# Patient Record
Sex: Male | Born: 1955 | Race: White | Hispanic: No | Marital: Married | Smoking: Former smoker
Health system: Southern US, Community
[De-identification: ages and names within clinical notes are randomized; demographics above are authoritative.]

## PROBLEM LIST (undated history)

## (undated) DIAGNOSIS — Z95810 Presence of automatic (implantable) cardiac defibrillator: Secondary | ICD-10-CM

## (undated) DIAGNOSIS — I2510 Atherosclerotic heart disease of native coronary artery without angina pectoris: Secondary | ICD-10-CM

## (undated) DIAGNOSIS — G4730 Sleep apnea, unspecified: Secondary | ICD-10-CM

## (undated) DIAGNOSIS — I509 Heart failure, unspecified: Secondary | ICD-10-CM

## (undated) DIAGNOSIS — D649 Anemia, unspecified: Secondary | ICD-10-CM

## (undated) DIAGNOSIS — M1990 Unspecified osteoarthritis, unspecified site: Secondary | ICD-10-CM

## (undated) DIAGNOSIS — J189 Pneumonia, unspecified organism: Secondary | ICD-10-CM

## (undated) DIAGNOSIS — I480 Paroxysmal atrial fibrillation: Secondary | ICD-10-CM

## (undated) DIAGNOSIS — I472 Ventricular tachycardia: Secondary | ICD-10-CM

## (undated) DIAGNOSIS — I4510 Unspecified right bundle-branch block: Secondary | ICD-10-CM

## (undated) DIAGNOSIS — I255 Ischemic cardiomyopathy: Secondary | ICD-10-CM

## (undated) DIAGNOSIS — I219 Acute myocardial infarction, unspecified: Secondary | ICD-10-CM

## (undated) DIAGNOSIS — E119 Type 2 diabetes mellitus without complications: Secondary | ICD-10-CM

## (undated) DIAGNOSIS — E785 Hyperlipidemia, unspecified: Secondary | ICD-10-CM

## (undated) HISTORY — DX: Sleep apnea, unspecified: G47.30

## (undated) HISTORY — DX: Hyperlipidemia, unspecified: E78.5

## (undated) HISTORY — PX: SHOULDER OPEN ROTATOR CUFF REPAIR: SHX2407

## (undated) HISTORY — DX: Anemia, unspecified: D64.9

## (undated) HISTORY — PX: FRACTURE SURGERY: SHX138

## (undated) HISTORY — PX: LIVER BIOPSY: SHX301

## (undated) HISTORY — DX: Paroxysmal atrial fibrillation: I48.0

## (undated) HISTORY — DX: Hemochromatosis, unspecified: E83.119

## (undated) HISTORY — DX: Unspecified right bundle-branch block: I45.10

## (undated) HISTORY — DX: Ischemic cardiomyopathy: I25.5

## (undated) HISTORY — PX: INGUINAL HERNIA REPAIR: SUR1180

---

## 1998-04-10 DIAGNOSIS — J189 Pneumonia, unspecified organism: Secondary | ICD-10-CM

## 1998-04-10 HISTORY — DX: Pneumonia, unspecified organism: J18.9

## 2000-06-25 ENCOUNTER — Ambulatory Visit (HOSPITAL_BASED_OUTPATIENT_CLINIC_OR_DEPARTMENT_OTHER): Admission: RE | Admit: 2000-06-25 | Discharge: 2000-06-26 | Payer: Self-pay | Admitting: Orthopedic Surgery

## 2001-11-10 ENCOUNTER — Encounter: Payer: Self-pay | Admitting: Emergency Medicine

## 2001-11-10 ENCOUNTER — Emergency Department (HOSPITAL_COMMUNITY): Admission: EM | Admit: 2001-11-10 | Discharge: 2001-11-10 | Payer: Self-pay

## 2004-04-10 DIAGNOSIS — I219 Acute myocardial infarction, unspecified: Secondary | ICD-10-CM

## 2004-04-10 HISTORY — DX: Acute myocardial infarction, unspecified: I21.9

## 2004-04-10 HISTORY — PX: CORONARY ARTERY BYPASS GRAFT: SHX141

## 2004-06-10 ENCOUNTER — Ambulatory Visit: Payer: Self-pay | Admitting: Hematology & Oncology

## 2004-07-27 ENCOUNTER — Ambulatory Visit: Payer: Self-pay | Admitting: Hematology & Oncology

## 2004-09-28 ENCOUNTER — Ambulatory Visit: Payer: Self-pay | Admitting: Hematology & Oncology

## 2004-11-22 ENCOUNTER — Ambulatory Visit: Payer: Self-pay | Admitting: Hematology & Oncology

## 2005-02-22 ENCOUNTER — Ambulatory Visit: Payer: Self-pay | Admitting: Hematology & Oncology

## 2005-03-12 ENCOUNTER — Inpatient Hospital Stay (HOSPITAL_COMMUNITY): Admission: EM | Admit: 2005-03-12 | Discharge: 2005-03-17 | Payer: Self-pay | Admitting: Emergency Medicine

## 2005-04-27 ENCOUNTER — Encounter (HOSPITAL_COMMUNITY): Admission: RE | Admit: 2005-04-27 | Discharge: 2005-07-26 | Payer: Self-pay | Admitting: Cardiology

## 2005-05-30 ENCOUNTER — Ambulatory Visit: Payer: Self-pay | Admitting: Hematology & Oncology

## 2005-06-08 HISTORY — PX: WRIST FRACTURE SURGERY: SHX121

## 2005-06-23 ENCOUNTER — Emergency Department (HOSPITAL_COMMUNITY): Admission: EM | Admit: 2005-06-23 | Discharge: 2005-06-23 | Payer: Self-pay | Admitting: Emergency Medicine

## 2005-06-27 ENCOUNTER — Ambulatory Visit (HOSPITAL_BASED_OUTPATIENT_CLINIC_OR_DEPARTMENT_OTHER): Admission: RE | Admit: 2005-06-27 | Discharge: 2005-06-27 | Payer: Self-pay | Admitting: Orthopedic Surgery

## 2005-11-23 ENCOUNTER — Ambulatory Visit: Payer: Self-pay | Admitting: Hematology & Oncology

## 2008-06-11 ENCOUNTER — Ambulatory Visit (HOSPITAL_COMMUNITY): Admission: RE | Admit: 2008-06-11 | Discharge: 2008-06-12 | Payer: Self-pay | Admitting: Orthopedic Surgery

## 2009-12-21 ENCOUNTER — Ambulatory Visit: Payer: Self-pay | Admitting: Cardiology

## 2009-12-22 ENCOUNTER — Ambulatory Visit: Payer: Self-pay | Admitting: Cardiology

## 2010-07-12 ENCOUNTER — Other Ambulatory Visit: Payer: Self-pay | Admitting: *Deleted

## 2010-07-12 DIAGNOSIS — E785 Hyperlipidemia, unspecified: Secondary | ICD-10-CM

## 2010-07-12 MED ORDER — ATORVASTATIN CALCIUM 40 MG PO TABS: 40.0000 mg | ORAL_TABLET | Freq: Every day | ORAL | Status: DC

## 2010-07-12 NOTE — Telephone Encounter (Signed)
Pt requesting to be changed to gen med for Vytorin. Per Dr. Swaziland will change to Lipitor 40 mg. Will repeat lab in 3 mo

## 2010-07-21 LAB — BASIC METABOLIC PANEL
BUN: 11 mg/dL (ref 6–23)
CO2: 23 mEq/L (ref 19–32)
Calcium: 9.3 mg/dL (ref 8.4–10.5)
Chloride: 105 mEq/L (ref 96–112)
Creatinine, Ser: 0.9 mg/dL (ref 0.4–1.5)
GFR calc Af Amer: >60 mL/min (ref >60)
GFR calc non Af Amer: >60 mL/min (ref >60)
Glucose, Bld: 98 mg/dL (ref 70–99)
Potassium: 4.5 mEq/L (ref 3.5–5.1)
Sodium: 137 mEq/L (ref 135–145)

## 2010-07-21 LAB — URINALYSIS, ROUTINE W REFLEX MICROSCOPIC
Bilirubin Urine: NEGATIVE
Glucose, UA: NEGATIVE mg/dL
Hgb urine dipstick: NEGATIVE
Ketones, ur: NEGATIVE mg/dL
Nitrite: NEGATIVE
Protein, ur: NEGATIVE mg/dL
Specific Gravity, Urine: 1.022 (ref 1.005–1.030)
Urobilinogen, UA: 1 mg/dL (ref 0.0–1.0)
pH: 7 (ref 5.0–8.0)

## 2010-07-21 LAB — GLUCOSE, CAPILLARY
Glucose-Capillary: 100 mg/dL — ABNORMAL HIGH (ref 70–99)
Glucose-Capillary: 117 mg/dL — ABNORMAL HIGH (ref 70–99)
Glucose-Capillary: 141 mg/dL — ABNORMAL HIGH (ref 70–99)
Glucose-Capillary: 145 mg/dL — ABNORMAL HIGH (ref 70–99)

## 2010-07-21 LAB — DIFFERENTIAL
Basophils Absolute: 0 10*3/uL (ref 0.0–0.1)
Basophils Relative: 0 % (ref 0–1)
Eosinophils Absolute: 0.1 10*3/uL (ref 0.0–0.7)
Eosinophils Relative: 2 % (ref 0–5)
Lymphocytes Relative: 26 % (ref 12–46)
Lymphs Abs: 1.6 10*3/uL (ref 0.7–4.0)
Monocytes Absolute: 0.5 10*3/uL (ref 0.1–1.0)
Monocytes Relative: 9 % (ref 3–12)
Neutro Abs: 3.9 10*3/uL (ref 1.7–7.7)
Neutrophils Relative %: 63 % (ref 43–77)

## 2010-07-21 LAB — CBC
HCT: 41.1 % (ref 39.0–52.0)
Hemoglobin: 14.8 g/dL (ref 13.0–17.0)
MCHC: 35.9 g/dL (ref 30.0–36.0)
MCV: 95.9 fL (ref 78.0–100.0)
Platelets: 239 10*3/uL (ref 150–400)
RBC: 4.28 MIL/uL (ref 4.22–5.81)
RDW: 12.5 % (ref 11.5–15.5)
WBC: 6.1 10*3/uL (ref 4.0–10.5)

## 2010-07-21 LAB — APTT: aPTT: 25 seconds (ref 24–37)

## 2010-07-21 LAB — PROTIME-INR
INR: 0.9 (ref 0.00–1.49)
Prothrombin Time: 12 seconds (ref 11.6–15.2)

## 2010-08-23 NOTE — Op Note (Signed)
NAMEMATTHEWS, [name]              ACCOUNT NO.:  [id number]   MEDICAL RECORD NO.:  [id number]          PATIENT TYPE:  AMB   LOCATION:  SDS                          FACILITY:  [facility]   PHYSICIAN:  Almedia Balls. Ranell Patrick, M.D. DATE OF BIRTH:  02-09-1956   DATE OF PROCEDURE:  06/11/2008  DATE OF DISCHARGE:                               OPERATIVE REPORT   PREOPERATIVE DIAGNOSIS:  Right shoulder rotator cuff tear.   POSTOPERATIVE DIAGNOSIS:  Right shoulder rotator cuff tear as well as  superior labral tear, anterior-posterior with unstable biceps anchor.   PROCEDURES PERFORMED:  1. Right shoulder arthroscopy with extensive intraarticular      debridement including debridement of superior labrum anterior and      posterior lesion as well as arthroscopic biceps tenotomy.  2. Arthroscopic labral debridement.  3. Arthroscopic subacromial decompression with CA ligament release      followed by mini-open rotator cuff repair and biceps tenodesis in a      groove.   ATTENDING SURGEON:  Almedia Balls. Ranell Patrick, MD   ASSISTANT:  Donnie Coffin. Dixon, PA-C.   ANESTHESIA:  General anesthesia was used plus interscalene block.   ESTIMATED BLOOD LOSS:  Minimal.   FLUID REPLACEMENT:  1200 mL crystalloid.   INSTRUMENT COUNT:  Correct.   COMPLICATIONS:  None.   Perioperative antibiotics were given.   INDICATIONS:  The patient is a 55 year old male with a history of  worsening right shoulder pain attributable to a torn rotator cuff.  The  patient has failed conservative management and has an MRI documenting a  torn cuff, presents now for operative repair.  The patient desires  surgical treatment to relieve his pain and improve his function.  Informed consent was obtained.   DESCRIPTION OF PROCEDURE:  After an adequate level of anesthesia was  achieved, the patient was positioned in the modified beach chair  position.  The right shoulder examined under anesthesia.  The patient  had full passive motion of  shoulder with no induced stiffness, no  instability.  Following an EUA, we went ahead and sterilely prepped and  draped the right shoulder and the arm in the usual manner.  We entered  the shoulder using standard arthroscopic portals including anterior,  posterior, and lateral portals.  We identified significant synovitis and  inflammation throughout the joint.  The patient had a torn superior  ligament with an unstable biceps anchor, performed a labral debridement  and biceps tenotomy using basket forceps and motorized shaver.  We  debrided down the stable labral tissue.  Anterior-inferior and posterior-  inferior labrum was intact.  The patient's rotator cuff was torn and  this appeared to be an inverted V-shaped tear to about the mid joint.  The subscapularis was noted to be normal.  Articular cartilage on the  glenoid was normal and on the humeral head was normal.  Posterior cuff  including teres minor and most of the infraspinatus was normal.  We then  placed the scope in the subacromial space, performed a thorough  bursectomy and acromioplasty creating a type 1 acromial shape, not  violating the inferior  AC ligaments.  We did release the CA ligament.  There was a thorough decompression of the rotator cuff outlet and the  tear was full thickness as visualized from the bursal surface.  So at  this point, we concluded the arthroscopic portion of surgery and made a  small mini-open incision starting at the raphe between the anterior and  lateral heads of the deltoid and extending down about 4 cm, dissection  down through subcutaneous tissues, split the deltoid along with its  fibers using Bovie.  We then placed an Arthrex retractor.  We externally  rotated to visualize the biceps groove.  We incised the soft tissue  overlying the biceps tendon, delivered the tendon into the wound,  whipstitched that with 2-0 FiberWire suture to reinforce the tenodesis  area.  We then placed a single  Penalok anchor in the floor of the  bicipital groove with mid tension with the elbow at 90 degrees.  We  tenodesed the tendon, bringing the Penalok suture up through the  reinforced area of the biceps tenodesis site.  Once we tied the tendon  down the groove, we clipped the remaining tendon off, trimmed the suture  and were happy with the low-profile repair.  At this point, we exposed  the rotator cuff tear portion.  This was again an inverted V tear with  about a 1-cm rotator cuff footprint exposed.  We used a rongeur to get  down to the bleeding bone.  We then placed 2 side-to-side #2 FiberWire  sutures after we freed up the tendon on both bursal and joint sides to  make sure it was good balance and was maximally released.  We put 2 side-  to-side #2 FiberWires per margin conversions technique.  We then placed  a single 5.5 Bio-Corkscrew anchor adjacent to the articular cartilage  bringing those sutures up to the anterior and posterior limbs of the  tear and bringing that tear further together and then we oversewed out  lateral to that with #2 FiberWire to a figure-of-eight.  We had a nice  low-profile repair.  We did leave one of those sutures tied from the  side-to-side repair and then used that with a PushLock anchor out  laterally to hold down the suture material and also to further hold that  tendon protecting the anchor sutures.  Once this was done, we took the  shoulder through a full range of motion and no impingement was noted.  We thoroughly irrigated and closed with interrupted 0-Vicryl suture on  the deltoid, 2-0 Vicryl subcutaneous closure and 4-0 Monocryl for the  skin.  Steri-Strips were applied followed by a sterile dressing.  The  patient tolerated the surgery well.      Almedia Balls. Ranell Patrick, M.D.  Electronically Signed     SRN/MEDQ  D:  06/11/2008  T:  06/12/2008  Job:  [id number]

## 2010-08-26 NOTE — H&P (Signed)
[name], [name]              ACCOUNT NO.:  [id number]   MEDICAL RECORD NO.:  [id number]          PATIENT TYPE:  INP   LOCATION:  2309                         FACILITY:  [facility]   PHYSICIAN:  Peter M. Swaziland, M.D.  DATE OF BIRTH:  01/02/1956   DATE OF ADMISSION:  03/11/2005  DATE OF DISCHARGE:                                HISTORY & PHYSICAL   HISTORY OF PRESENT ILLNESS:  [name] is a 55 year old white male smoker  who presents with acute onset of substernal chest pain at approximately 10  p.m. tonight.  The patient was described as sever, crushing, mid central  chest pain radiating to his right arm, associated with shortness of breath  with some radiation to his back.  It was unrelieved.  It was noted on  transport by EMS that his blood pressure dropped to 70 systolic.  The  patient admits to having some intermittent substernal chest discomfort and  shortness of breath for the past week.  He has no known cardiac history.  His major cardiac risks factor is smoking.  He denies history of diabetes,  hypertension, or hypercholesterolemia.   PAST MEDICAL HISTORY:  1.  Significant for sleep apnea, currently untreated.  2.  History of prior left rotator cuff repair and needs repair on his right      rotator cuff.  3.  History of hemochromatosis and is followed by Dr. Myna Hidalgo and, in fact,      had phlebotomy yesterday.   MEDICATIONS:  Include aspirin 81 mg per day.   ALLERGIES:  No known allergies.   SOCIAL HISTORY:  He is married and has one daughter.  He smoked 1-1/2 packs  per day and has been smoking for over 35 years.  He also has a history of  significant alcohol abuse with drinking up to 18 beers per day.   FAMILY HISTORY:  Unknown as he is adopted.   REVIEW OF SYSTEMS:  He does note some sweating.  He has had no nausea or  vomiting.  His mouth is dry.  He has had no history of [name], stroke, or  claudication.  He has had no bleeding disorder.  Other Review of Systems  are  negative.   PHYSICAL EXAMINATION:  GENERAL:  The patient is a middle-aged white male in  moderate distress.  VITAL SIGNS:  Blood pressure 100/60, pulse 105 in sinus rhythm.  He is  afebrile.  HEENT:  Normocephalic and atraumatic.  Pupils equal, round, and reactive.  Conjunctivae are injected.  Oropharynx is clear.  NECK:  Supple without JVD, adenopathy, thyromegaly, or bruits.  LUNGS:  Clear to auscultation and percussion.  CARDIAC:  Regular rate and rhythm without murmur, rub, or gallop.  ABDOMEN:  Soft and nontender.  EXTREMITIES:  Pulses are 2+ and symmetric.  He has no edema.  NEUROLOGIC:  Exam is nonfocal.   LABORATORY DATA:  ECG shows normal sinus rhythm and right bundle branch  block.  There is ST elevation in leads V1 through V3 as well as in lead III.  There is a Q wave in lead III as well.  IMPRESSION:  1.  Acute myocardial infarction, question septal by ECG and criteria.  2.  Tobacco abuse.  3.  Hemochromatosis.  4.  Alcohol abuse.  5.  Sleep apnea.   PLAN:  1.  The patient will be taken for emergent cardiac catheterization with      possible intervention.  2.  We will need to avoid nitrates as the patient has taken Viagra tonight.  3.  He is given aspirin and IV heparin in the emergency room as well as IV      analgesics.           ______________________________  Peter M. Swaziland, M.D.     PMJ/MEDQ  D:  03/12/2005  T:  03/12/2005  Job:  [id number]   cc:   Sheliah Plane, MD  [address]  [place]  [place] [address]   Geoffry Paradise, M.D.  Fax: [phone]   Rose Phi. Myna Hidalgo, M.D.  Fax: [phone]

## 2010-08-26 NOTE — Cardiovascular Report (Signed)
[name], [name]              ACCOUNT NO.:  [id number]   MEDICAL RECORD NO.:  [id number]          PATIENT TYPE:  INP   LOCATION:  2309                         FACILITY:  [facility]   PHYSICIAN:  [name], M.D.  DATE OF BIRTH:  21-Apr-1955   DATE OF PROCEDURE:  03/12/2005  DATE OF DISCHARGE:                              CARDIAC CATHETERIZATION   INDICATIONS FOR PROCEDURE:  A 55 year old white male presents with acute  myocardial infarction and crushing chest pain. He has a history of tobacco  abuse and hemochromatosis.   Access was via the right femoral artery using standard Seldinger technique.  Also access the left femoral artery for placement of intra-aortic balloon  pump.   EQUIPMENT:  A 6-French 5 cm left Judkins catheter, 6-French 4 cm right  Judkins catheter, 6-French pigtail catheter and a 40 cm intra-aortic balloon  pump.   PROCEDURES:  Left heart catheterization, coronary and left ventricular  angiography, placement of intra-aortic balloon pump.   MEDICATIONS:  Morphine 2 milligrams IV. ACT was 192.   CONTRAST DYE:  105 cc of Omnipaque.   HEMODYNAMIC DATA:  Aortic pressure was 97/64 with a mean of 80 mmHg, left  ventricle pressure 97 with EDP of 36 mmHg.   ANGIOGRAPHIC DATA:  Left coronary arises and distributes normally. Left main  coronary artery is calcified without significant obstructive disease.   Left anterior descending artery is a large vessel. It has a 90% proximal  stenosis. There appears to be an acute cutoff of the LAD at the far distal  LAD as it wraps around the apex. The first diagonal branch is diffusely  diseased up to 40%.   There is a very small intermediate branch which has 80-90% stenosis  proximally.   The left circumflex coronary artery is occluded. There is left to left  collaterals to single moderate-sized marginal vessel. There is a fairly long  gap between the total occlusion and where the vessel fills by collateral.   The  right coronary artery is occluded at the ostium. There is a left to  right collaterals to the distal right coronary circulation what appears to  be posterolateral PDA branches. There is no significant collateralization of  the mid and proximal right coronary. There is a small AV fistula that arises  from the proximal right coronary artery that appears to connect to the right  atrium.   Left ventricular angiography performed in RAO view demonstrates inferior  wall akinesia. There is global hypokinesia with overall moderate to severe  left ventricular systolic dysfunction. Ejection fraction estimated at 35%.   FINAL INTERPRETATION:  1.  Severe three-vessel obstructive atherosclerotic coronary disease. Based      on his findings, I think that the acute lesion is actually his left      anterior descending artery. The right coronary and left circumflex      coronary occlusions appear to be old and do have faint to moderate      collaterals.  2.  Moderate to severe left ventricular dysfunction.  3.  Significantly elevated left ventricular filling pressures.   PLAN:  The  patient does not appear to be a suitable candidate for  percutaneous catheter-based intervention. The totally occluded circumflex  and right coronary artery are poorly suited for acute intervention. The LAD  is severe but would be extremely high risk given his LV dysfunction and  given the fact his other vessels were occluded. We recommended coronary  bypass surgery and consultation was obtained with Dr. Tyrone Weaver. Intra-aortic  balloon pump was placed via the left femoral artery and with balloon pump  placement his low blood pressure improved and his chest pain symptoms  resolved. The patient was transferred to the intensive care unit pending a  decision concerning bypass surgery.           ______________________________  [name], M.D.     PMJ/MEDQ  D:  03/12/2005  T:  03/12/2005  Job:  [id number]   cc:   Colton Plane, MD  [address]  [place]  [place] [address]   [name], M.D.  Fax: [phone]   [name], M.D.  Fax: [phone]

## 2010-08-26 NOTE — Discharge Summary (Signed)
[name], COUNSELL              ACCOUNT NO.:  [id number]   MEDICAL RECORD NO.:  [id number]          PATIENT TYPE:  INP   LOCATION:  2028                         FACILITY:  [facility]   PHYSICIAN:  Sheliah Plane, MD    DATE OF BIRTH:  08-18-1955   DATE OF ADMISSION:  03/11/2005  DATE OF DISCHARGE:  03/16/2005                                 DISCHARGE SUMMARY   PRIMARY ADMITTING DIAGNOSIS:  Chest pain.   DISCHARGE DIAGNOSES:  1.  Severe three-vessel coronary artery disease.  2.  Moderate to severe left ventricular dysfunction.  3.  History of sleep apnea.  4.  History of hemochromatosis followed by Dr. Myna Hidalgo.  5.  Hyperlipidemia.  6.  Postoperative hyperglycemia.  7.  History of tobacco abuse.  8.  Status post acute septal myocardial infarction.   PROCEDURES PERFORMED:  1.  Cardiac catheterization.  2.  Coronary artery bypass grafting (emergent) x3 (left internal mammary      artery to the LAD, saphenous vein graft to the obtuse marginal,      saphenous vein graft to the distal right coronary artery).  3.  Endoscopic vein harvest right thigh.   HISTORY:  The patient was 55 year old male who presented to the emergency  department on the date of this admission complaining of crushing substernal  chest pain radiating to his arm and associated with shortness of breath. He  was hypotensive in the ER and was found to have EKG changes of ST elevation  in leads V1-V3 as well as lead IV and Q-waves in lead III. He was seen by  Dr. Swaziland and was taken for emergent cardiac catheterization. He was found  to have severe three-vessel coronary artery disease which was not felt to be  amenable to percutaneous intervention. An intra-aortic balloon pump was  placed and the patient was stabilized and admitted to the floor.   HOSPITAL COURSE:  A cardiothoracic surgery consultation was obtained and the  patient was seen by Dr. Tyrone Sage. He reviewed the patient's films and agreed  his best  course of action would be to proceed with surgical  revascularization at this time. His cath did show a chronically occluded  right coronary artery, a chronically occluded circumflex coronary, and a 90%  proximal LAD lesion which was felt to be the culprit lesion. He explained  the risks, benefits and alternatives of surgery with the patient and the  patient agreed to proceed with CABG. He was taken to the operating room on  March 12, 2005 underwent CABG x3 as described in detail above. He  tolerated the procedure well and was transferred to the SICU in stable  condition. He was able to be extubated shortly after surgery. He was  hemodynamically stable and doing well on postoperative day #1. He was  maintained on low-dose dopamine drip initially and this was weaned over the  course of his first postoperative day and discontinued. His intra-aortic  balloon pump was also removed later that day on postoperative day #1. He  remained stable and by postoperative day three he was ready for transfer to  the floor.  Overall, he has made good progress postoperatively. He has been  hyperglycemic during this admission and has been treated with sliding scale  insulin and carbohydrate modified diet. His hemoglobin A1c on admission was  6.8. His blood pressures have also remained on the low side with systolic's  running just under 110. He has been started on a beta blocker and ACE  inhibitor but doses have not been titrated secondary to his low blood  pressure. It is felt that this can be further adjusted as an outpatient. He  has been volume overloaded and was started on Lasix and potassium  supplementation and is diuresing well. He has been seen by the tobacco  cessation counselor regarding quitting smoking. She has given the patient  educational materials and follow-up information for home. He has remained  afebrile and all vital signs have been stable. He was maintaining normal  sinus rhythm with  some mild tachycardia with heart rates in the low 100s.  His surgical incision sites were healing well. He was ambulating in the  halls without difficulty. He was maintaining O2 saturations of greater than  90% on room air.   His most recent labs showed hemoglobin of 11.9, hematocrit 33, white count  9.1, platelet count 205,000. Sodium 134, potassium 3.7 which has been  supplemented, BUN 17, creatinine 0.9. Also upon admission, the patient's  lipid profile showed a total cholesterol of 193, triglycerides 288, HDL 30,  LDL 121. It was felt that since he has remained stable and progressing  nicely that he will hopefully be ready for discharge home by March 17, 2005.   DISCHARGE MEDICATIONS:  1.  Enteric-coated aspirin 325 milligrams daily.  2.  Lopressor 25 milligrams b.i.d.  3.  Lisinopril 10 milligrams daily.  4.  Lipitor 20 milligrams daily.  5.  Lasix 40 milligrams daily x1 week.  6.  K-Dur 20 mEq daily x1 week.  7.  Folic acid 1 milligram daily.  8.  Tylox one to two q.4h. p.r.n. for pain.   DISCHARGE INSTRUCTIONS:  He is asked to refrain from driving, heavy lifting  or strenuous activity. He may continue ambulating daily and using his  incentive spirometer. He may shower daily and clean his incisions with soap  and water. He will continue low-fat, low-sodium diet.   DISCHARGE FOLLOWUP:  He will see Dr. Swaziland back in the office in two weeks  and should call to schedule an appointment. He will have a chest x-ray at  that visit. He will see Dr. Tyrone Sage on April 13, 2005 and should bring  his chest x-ray at this appointment for Dr. Tyrone Sage to review. He should  also follow up with his primary care physician in the next two to three  weeks for recheck of his blood sugars. He will call in the interim if he  experiences any problems or has questions.      Coral Ceo, P.A.      Sheliah Plane, MD Electronically Signed    GC/MEDQ  D:  03/16/2005  T:  03/16/2005   Job:  [id number]   cc:   Peter M. Swaziland, M.D.  Fax: [phone]   Geoffry Paradise, M.D.  Fax: [phone]   Rose Phi. Myna Hidalgo, M.D.  Fax: [phone]

## 2010-08-26 NOTE — Op Note (Signed)
[place]  Patient:    [name], [name]                     MRN: [id number] Proc. Date: 06/25/00 Adm. Date:  [id number] Attending:  Carolan Shiver Ii                           Operative Report  PREOPERATIVE DIAGNOSIS:  Rotator cuff tear, left shoulder, chronic with type 3 acromion.  POSTOPERATIVE DIAGNOSIS:  Rotator cuff tear, left shoulder, chronic with type 3 acromion.  OPERATION PERFORMED:  Open repair of chronic rotator cuff tear with acromioplasty and release of CA ligament.  SURGEON:  John L. Dorothyann Gibbs, M.D.  ASSISTANT:  Arnoldo Morale, P.A.  ANESTHESIA:  General.  INDICATIONS FOR PROCEDURE:  The patient has 1.5 cm avulsion of the supraspinatus at the greater tuberosity which appears relatively chronic and a very much downward sloping acromion with a very tight subacromial space and thickened bursa.  DESCRIPTION OF PROCEDURE:  Under general anesthesia, a sabercut incision was made in the left shoulder after routine prep and drape.  The deltoid was peeled from the lateral acromion with the electrocautery and from the anterior lateral of the acromion, fibers of the deltoid were separated and spread, showing the corner of the acromion and the underlying rotator cuff tear.  A Gelpe retractor was inserted.  A Cobb elevator was placed over the rotator cuff and a one inch curved osteotome was used to perform acromioplasty.  A rasp was then used to further smooth the undersurface of the acromion and remove any ridges extended over to the [facility] joint.  Once this was completed, it was irrigated.  The rotator cuff tear was examined.  Further synovitis of the bursa was removed.  The edge of the cuff was freshened with a 15 blade at the tear.  The greater tuberosity as a bone trough made using osteotome and mallet.A small drill was then used and #2 Tycron suture was placed in a modified tendon stitch pulling the tendon down to the bone trough  in three places giving an excellent repair with the knots tied below the level of the greater tuberosity.  The deltoid muscle was then reattached to the acromion through drill holes using #2 Tycron suture.  The subcutaneous was closed with 0 and 2-0 Vicryl and skin with clips.  Total operative time approximately 45 minutes.  The patient tolerated the procedure well and returned to recovery in good condition.  It should be noted that the shoulder was infiltrated with Marcaine with morphine with epinephrine at the close of the case for pain control assistance. DD:  06/25/00 TD:  06/26/00 Job: [id number]

## 2010-08-26 NOTE — Op Note (Signed)
[name], [name]              ACCOUNT NO.:  [id number]   MEDICAL RECORD NO.:  [id number]          PATIENT TYPE:  INP   LOCATION:  2028                         FACILITY:  [facility]   PHYSICIAN:  Sheliah Plane, MD    DATE OF BIRTH:  [birth year]   DATE OF PROCEDURE:  03/12/2005  DATE OF DISCHARGE:                                 OPERATIVE REPORT   PREOPERATIVE DIAGNOSIS:  Acute myocardial infarction with coronary occlusive  disease.   POSTOPERATIVE DIAGNOSIS:  Acute myocardial infarction with coronary  occlusive disease.   PROCEDURE:  Emergency coronary artery bypass grafting x3 with left internal  mammary to the left anterior descending coronary artery, reverse saphenous  vein graft to the obtuse marginal coronary artery and reverse saphenous vein  graft to the distal right coronary artery with right endovein harvesting.   SURGEON:  Sheliah Plane, M.D.   FIRST ASSISTANT:  Pecola Leisure, P.A.   BRIEF HISTORY:  The patient is a 55 year old male who presented to the  emergency room very late on March 11, 2005.  He was initially seen in the  emergency room and was found to be hypotensive.  He underwent cardiac  catheterization by Dr. Peter Swaziland and placement of intra-aortic balloon  pump.  The patient was seen in emergency consultation and was found to have  what appeared to be a chronically occluded right coronary artery,  chronically occluded circumflex coronary artery and a 90% proximal LAD  lesion.  With intra-aortic balloon pump, patient stabilized and became pain-  free.  Coronary artery bypass grafting was recommended, the patient agreed  and signed informed consent.   DESCRIPTION OF PROCEDURE:  With Swan-Ganz and arterial line monitors in  place, the patient underwent general endotracheal anesthesia without  incident.  The skin of the chest and legs was prepped with Betadine and  draped in the usual sterile manner.  Using a [employer] endovein harvesting  system, a  segment of vein was harvested from the right thigh and was of  adequate quality and caliber.  A median sternotomy was performed and the  left internal mammary artery was dissected out as a pedicle graft.  The  distal artery was divided and had good free flow.  The pericardium was  opened and overall ventricular function, especially of the anterior wall,  appeared normal.  The inferior wall was hypokinetic, this was with the intra-  aortic balloon pump in place and functioning.  The patient was systemically  heparinized, the ascending aorta and the right atrium cannulated and the  aortic root vented.  Cardioplegia needle was introduced into the ascending  aorta.  The patient was placed on cardiopulmonary bypass 2.4 liter per  minute per sq m.  Sites of anastomosis were selected.  The obtuse marginal  vessel was opened and it was diffusely diseased but did admit 1.5 mm probe.  Vessel was partially intramyocardial.  Using a running 7-0 Prolene, a distal  anastomosis was performed.  Attention was then turned to the posterior  descending.  This vessel was very small but the distal right was somewhat  larger and was opened  and admitted a 1.5-mm probe.  Using a running 7-0  Prolene, a distal anastomosis was performed.  Additional cold blood  cardioplegia was administered down the vein grafts.  Attention was then  turned to the left anterior descending coronary artery.  The vessel was  opened and admitted a 1.5-mm probe.  Distally, using a running 8-0 Prolene,  the left internal mammary artery was anastomosed to the left anterior  descending coronary artery.  There was a rise of myocardial septal  temperature with bulldog on the mammary artery.  The bulldog was replaced.  Additional cold blood cardioplegia was administered.  Two punch aortotomies  were performed, each of the two vein grafts anastomosed to the ascending  aorta.  The cross-clamp was removed with total cross-clamp time of 69  minutes.   With the intra-aortic balloon pump in place and on dopamine  infusion, the patient was ventilated and weaned from cardiopulmonary bypass.  He remained hemodynamically stable.  He was decannulated in the usual  fashion and protamine sulfate was administered with the operative field  hemostatic.  Two atrial and two ventricular pacing wires were applied.  Graft markers applied.  The left pleural tube and two mediastinal tubes left  in place.  The sternum was closed with #6 stainless steel wire, the fascia  closed with interrupted 0 Vicryl, running 3-0 Vicryl in the subcutaneous  tissue and a 4-0 subcuticular stitch on the skin edges.  Dry dressings were  applied.  Sponge and needle counts were reported as correct upon completion  of the procedure.  The patient tolerated the procedure without obvious  complication and was transferred to the surgical intensive care unit for  further postoperative care.      Sheliah Plane, MD  Electronically Signed     EG/MEDQ  D:  03/16/2005  T:  03/16/2005  Job:  [id number]   cc:   Peter M. Swaziland, M.D.  Fax: [phone]   Geoffry Paradise, M.D.  Fax: [phone]

## 2010-08-26 NOTE — Op Note (Signed)
NAMEMARGARITA, [name]              ACCOUNT NO.:  [id number]   MEDICAL RECORD NO.:  [id number]          PATIENT TYPE:  AMB   LOCATION:  DSC                          FACILITY:  [facility]   PHYSICIAN:  Cindee Salt, M.D.       DATE OF BIRTH:  Jan 03, 1956   DATE OF PROCEDURE:  06/27/2005  DATE OF DISCHARGE:                                 OPERATIVE REPORT   PREOPERATIVE DIAGNOSIS:  Distal radius fracture, ulnar styloid fracture,  left arm.   POSTOPERATIVE DIAGNOSIS:  Distal radius fracture, ulnar styloid fracture,  left arm.   OPERATION:  Open reduction internal fixation left distal radius fracture  with Orthofix plate, repair of ulnar styloid with tension band wiring, left  ulna.   SURGEON:  Cindee Salt, M.D.   ASSISTANT:  Carolyne Fiscal R.N.   ANESTHESIA:  Axillary general.   HISTORY:  The patient is a 55 year old male who suffered a fall from the  back of his truck suffering a distal radius fracture, comminuted intra-  articular in nature. He was placed in a splint in [facility] ER and referred.   PROCEDURE:  The patient is brought to the operating room where a  supraclavicular block was given. He was prepped and draped using DuraPrep,  supine position, left arm free.  He had some feeling.  A general anesthetic  was then given.  The limb was exsanguinated with an Esmarch bandage after  reduction of the fracture.  Tourniquet placed high on the arm was inflated  to 250 mmHg.  A volar incision was then made, carried down through  subcutaneous tissue. Dissection carried down through the flexor carpi  radialis tendon sheath.  Radial artery identified, protected. Retractors  placed. The pronator quadratus was then elevated off from the distal radius  and distal radius fracture was immediately apparent. This was extremely  distal.  The fracture was manipulated reduced.  The brachial radialis was  released allowing the styloid to be reduced and the fracture to be brought  over into a normal ulnar  position. The Orthofix plate was then placed onto  the radius. This was affixed proximally with the sliding screw.  The hole  drilled.  This was then measured and a 14 mm screw placed. This was then  firmly fixed.  The fracture was then aligned under image intensification.  The plate aligned and pinned with two K-wires and x-rays confirmed reduction  of the fracture, good placement of the plate. The distal screws were then  each drilled.  These were then placed using the 2 mm distal locking screws.  This firmly fixed the fracture.  X-rays after placement of the ulnar screws  revealed that none were in the joint space.  The styloid was then placed  with a 2.7.  These measured between 18 and 22 mm.  This firmly fixed the  styloid.  The proximal 2.4 mm screws were then placed. X-rays confirmed  reduction in both AP and lateral direction.  Obliques revealed no screws  within the joint articular surface. The fracture was well maintained.  The  proximal screws were then placed after drilling  with the 2.5 mm drill bit.  These measured 16 and 14 mm.  This firmly fixed the fracture in AP lateral  direction on x-ray.  Instability the distal radioulnar joint was noted and  decision was made to proceed with fixation of this fracture fragment with a  K-wire and tension band wiring using FiberWire.  A incision was then made on  the ulnar aspect of the wrist volar to the extensor carpi ulnaris sheath.  The dorsal sensory branch to the ulnar nerve was identified and protected.  Retractors placed.  The nonunion of the styloid was immediately apparent.  This was the entire base of the styloid off revealing total discontinuity of  the triangle fibrocartilage complex from the distal ulna. This was reduced  and pinned with a K-wire 45 mm. This was placed into the radial cortex. X-  rays confirmed reduction in AP lateral directions with good stability of the  distal radioulnar joint.  Drill holes were placed for  placement of a tension  band wire and a 3-0 FiberWire was then placed through the triangle  fibrocartilage distal and then through the drill holes on the proximal shaft  of the ulna. This was crisscrossed and sutured into position after  compressing the styloid fragment. The K-wire was bent and driven securing  the FiberWire in place. Wounds were irrigated. The subcutaneous tissue was  closed with interrupted 4-0 Vicryl on each of the wounds.  The subcutaneous  tissue with 4-0 Vicryl and the skin with interrupted 5-0 nylon sutures. A  sterile compressive dressing and dorsal palmar splint applied. The patient  tolerated the procedure well and was taken to the recovery observation in  satisfactory condition. He is discharged home to return to the [facility]  of [name] in one week on Percocet.           ______________________________  Cindee Salt, M.D.     GK/MEDQ  D:  06/27/2005  T:  06/28/2005  Job:  [id number]

## 2010-09-29 ENCOUNTER — Encounter: Payer: Self-pay | Admitting: Cardiology

## 2010-10-06 ENCOUNTER — Ambulatory Visit (INDEPENDENT_AMBULATORY_CARE_PROVIDER_SITE_OTHER): Payer: BC Managed Care – PPO | Admitting: Cardiology

## 2010-10-06 ENCOUNTER — Ambulatory Visit: Payer: Self-pay | Admitting: Cardiology

## 2010-10-06 ENCOUNTER — Encounter: Payer: Self-pay | Admitting: Cardiology

## 2010-10-06 VITALS — BP 104/60 | HR 90 | Ht 72.0 in | Wt 207.0 lb

## 2010-10-06 DIAGNOSIS — I252 Old myocardial infarction: Secondary | ICD-10-CM

## 2010-10-06 DIAGNOSIS — I4510 Unspecified right bundle-branch block: Secondary | ICD-10-CM

## 2010-10-06 DIAGNOSIS — I2589 Other forms of chronic ischemic heart disease: Secondary | ICD-10-CM

## 2010-10-06 DIAGNOSIS — I2510 Atherosclerotic heart disease of native coronary artery without angina pectoris: Secondary | ICD-10-CM

## 2010-10-06 DIAGNOSIS — E785 Hyperlipidemia, unspecified: Secondary | ICD-10-CM

## 2010-10-06 DIAGNOSIS — I255 Ischemic cardiomyopathy: Secondary | ICD-10-CM

## 2010-10-06 DIAGNOSIS — E119 Type 2 diabetes mellitus without complications: Secondary | ICD-10-CM

## 2010-10-06 DIAGNOSIS — I480 Paroxysmal atrial fibrillation: Secondary | ICD-10-CM

## 2010-10-06 DIAGNOSIS — I4891 Unspecified atrial fibrillation: Secondary | ICD-10-CM

## 2010-10-06 LAB — LIPID PANEL
Cholesterol: 242 mg/dL — ABNORMAL HIGH (ref 0–200)
HDL: 39.8 mg/dL (ref >39.00)
Triglycerides: 366 mg/dL — ABNORMAL HIGH (ref 0.0–149.0)
VLDL: 73.2 mg/dL — ABNORMAL HIGH (ref 0.0–40.0)

## 2010-10-06 LAB — BASIC METABOLIC PANEL
BUN: 11 mg/dL (ref 6–23)
CO2: 27 mEq/L (ref 19–32)
Calcium: 9 mg/dL (ref 8.4–10.5)
Creatinine, Ser: 0.9 mg/dL (ref 0.4–1.5)
GFR: 95.73 mL/min (ref >60.00)
Glucose, Bld: 116 mg/dL — ABNORMAL HIGH (ref 70–99)
Potassium: 4.7 mEq/L (ref 3.5–5.1)
Sodium: 137 mEq/L (ref 135–145)

## 2010-10-06 LAB — HEPATIC FUNCTION PANEL
ALT: 26 U/L (ref 0–53)
AST: 21 U/L (ref 0–37)
Albumin: 4.3 g/dL (ref 3.5–5.2)
Alkaline Phosphatase: 56 U/L (ref 39–117)
Bilirubin, Direct: 0.1 mg/dL (ref 0.0–0.3)
Total Bilirubin: 0.9 mg/dL (ref 0.3–1.2)
Total Protein: 6.8 g/dL (ref 6.0–8.3)

## 2010-10-06 LAB — HEMOGLOBIN A1C: Hgb A1c MFr Bld: 6 % (ref 4.6–6.5)

## 2010-10-06 LAB — LDL CHOLESTEROL, DIRECT: Direct LDL: 157.2 mg/dL

## 2010-10-06 NOTE — Patient Instructions (Signed)
We will call with the results of your blood work today.  Continue your current medications.  I will see you back in 6 months.

## 2010-10-07 ENCOUNTER — Encounter: Payer: Self-pay | Admitting: Cardiology

## 2010-10-07 DIAGNOSIS — I2510 Atherosclerotic heart disease of native coronary artery without angina pectoris: Secondary | ICD-10-CM | POA: Insufficient documentation

## 2010-10-07 DIAGNOSIS — I480 Paroxysmal atrial fibrillation: Secondary | ICD-10-CM | POA: Insufficient documentation

## 2010-10-07 DIAGNOSIS — E119 Type 2 diabetes mellitus without complications: Secondary | ICD-10-CM | POA: Insufficient documentation

## 2010-10-07 DIAGNOSIS — I255 Ischemic cardiomyopathy: Secondary | ICD-10-CM | POA: Insufficient documentation

## 2010-10-07 DIAGNOSIS — I4510 Unspecified right bundle-branch block: Secondary | ICD-10-CM | POA: Insufficient documentation

## 2010-10-07 DIAGNOSIS — I252 Old myocardial infarction: Secondary | ICD-10-CM | POA: Insufficient documentation

## 2010-10-07 DIAGNOSIS — E785 Hyperlipidemia, unspecified: Secondary | ICD-10-CM | POA: Insufficient documentation

## 2010-10-07 NOTE — Progress Notes (Signed)
   [name] Date of Birth: [birth year]   History of Present Illness: [name] is seen today for followup visit. He states he has been doing very well without any significant cardiac complaints. He denies any chest pain, shortness of breath, or increased edema. He's had no palpitations. He remains fairly active.  Current Outpatient Prescriptions on File Prior to Visit  Medication Sig Dispense Refill  . aspirin 325 MG tablet Take 81 mg by mouth daily.       Marland Kitchen atorvastatin (LIPITOR) 40 MG tablet Take 1 tablet (40 mg total) by mouth at bedtime.  30 tablet  5  . carvedilol (COREG) 12.5 MG tablet Take 12.5 mg by mouth 2 (two) times daily with a meal.        . lisinopril (PRINIVIL,ZESTRIL) 10 MG tablet Take 10 mg by mouth daily.        . metFORMIN (GLUCOPHAGE) 500 MG tablet Take 500 mg by mouth 2 (two) times daily with a meal.        . DISCONTD: fish oil-omega-3 fatty acids 1000 MG capsule Take 2 g by mouth daily. Mega-Red        No Known Allergies  Past Medical History  Diagnosis Date  . MI, old 2006  . Ischemic cardiomyopathy     EF 36%  . LV dysfunction     MODERATE TO SEVERE  . Hyperlipidemia   . Hemochromatosis   . RBBB (right bundle branch block)   . Rotator cuff injury   . Diabetes mellitus   . Sleep apnea   . PAF (paroxysmal atrial fibrillation)   . Anemia   . Hypokalemia   . Hyponatremia     Past Surgical History  Procedure Date  . Cardiac catheterization 03/12/2005    INFERIOR WALL AKINESIA. THERE IS GLOBAL HYPOKINESIA WITH OVERALL MODERATE TO SEVERE LEFT VENTRICULAR SYSTOLIC DYSFUNCTION. EF 35%  . Coronary artery bypass graft 2006  . Shoulder surgery   . Rotator cuff repair     History  Smoking status  . Former Smoker -- 2.0 packs/day for 30 years  . Types: Cigarettes  . Quit date: 03/11/2005  Smokeless tobacco  . Never Used    History  Alcohol Use No    History reviewed. No pertinent family history.  Review of Systems: As per history of present  illness  All other systems were reviewed and are negative.  Physical Exam: BP 104/60  Pulse 90  Ht 6' (1.829 m)  Wt 207 lb (93.895 kg)  BMI 28.07 kg/m2 He is a well-developed white male in no acute distress. His HEENT exam is unremarkable. Is normocephalic, atraumatic. Pupils are equal round and reactive to light accommodation. Extraocular movements are full. Oropharynx is clear. Neck is supple without JVD, adenopathy, thyromegaly, or bruits. Lungs are clear. Cardiac exam reveals a regular rate and rhythm without gallop, murmur, or click. Abdomen is soft and nontender without masses or bruits. He has no edema. Pedal pulses are good. Skin is warm and dry. He is alert and oriented x3. Cranial nerves II through XII are intact. LABORATORY DATA:   Assessment / Plan:

## 2010-10-07 NOTE — Assessment & Plan Note (Signed)
He is well compensated on medical therapy including carvedilol and lisinopril. He has no evidence of volume overload.

## 2010-10-07 NOTE — Assessment & Plan Note (Signed)
His lipids are poorly controlled today. We'll continue with Lipitor 40 mg per day and add TriCor 145 mg daily. We'll recheck his lab work in 3 months.

## 2010-10-07 NOTE — Assessment & Plan Note (Addendum)
He is asymptomatic on medical therapy. His last Cardiolite study in January 2010 showed evidence of inferior and inferior lateral infarct. We will continue with his current medical therapy and risk factor modification.

## 2010-10-11 ENCOUNTER — Telehealth: Payer: Self-pay | Admitting: *Deleted

## 2010-10-11 NOTE — Telephone Encounter (Signed)
Message copied by Lorayne Bender on Tue Oct 11, 2010  9:57 AM ------      Message from: Swaziland, PETER M      Created: Fri Oct 07, 2010 12:41 PM       Chemstries and A1c look very good. Lipids are not good with elevated trigs and LDL. Continue lipitor and add Tricor 145 mg daily. Repeat HFP and lipids in 3 months.

## 2010-10-11 NOTE — Telephone Encounter (Signed)
Lm to call back for lab results 

## 2010-10-13 ENCOUNTER — Other Ambulatory Visit: Payer: Self-pay | Admitting: *Deleted

## 2010-10-13 DIAGNOSIS — E7800 Pure hypercholesterolemia, unspecified: Secondary | ICD-10-CM

## 2010-10-13 NOTE — Telephone Encounter (Signed)
Called back and gave lab results. Will send copy to Dr Jacky Kindle and fax copy to wife's office; [phone]

## 2010-11-03 ENCOUNTER — Other Ambulatory Visit: Payer: Self-pay | Admitting: *Deleted

## 2010-11-03 DIAGNOSIS — E7800 Pure hypercholesterolemia, unspecified: Secondary | ICD-10-CM

## 2010-11-14 ENCOUNTER — Other Ambulatory Visit: Payer: Self-pay | Admitting: *Deleted

## 2010-11-14 MED ORDER — LISINOPRIL 10 MG PO TABS: 10.0000 mg | ORAL_TABLET | Freq: Every day | ORAL | Status: DC

## 2010-11-14 NOTE — Telephone Encounter (Signed)
escribe medication per fax request  

## 2010-12-13 ENCOUNTER — Other Ambulatory Visit: Payer: Self-pay | Admitting: *Deleted

## 2010-12-13 MED ORDER — CARVEDILOL 12.5 MG PO TABS: 12.5000 mg | ORAL_TABLET | Freq: Two times a day (BID) | ORAL | Status: DC

## 2010-12-13 NOTE — Telephone Encounter (Signed)
escribe medication per fax request  

## 2011-01-13 ENCOUNTER — Other Ambulatory Visit (INDEPENDENT_AMBULATORY_CARE_PROVIDER_SITE_OTHER): Payer: BC Managed Care – PPO | Admitting: *Deleted

## 2011-01-13 ENCOUNTER — Other Ambulatory Visit: Payer: BC Managed Care – PPO | Admitting: *Deleted

## 2011-01-13 DIAGNOSIS — E7800 Pure hypercholesterolemia, unspecified: Secondary | ICD-10-CM

## 2011-01-13 LAB — LIPID PANEL
Cholesterol: 196 mg/dL (ref 0–200)
HDL: 42.4 mg/dL (ref >39.00)
Total CHOL/HDL Ratio: 5
Triglycerides: 188 mg/dL — ABNORMAL HIGH (ref 0.0–149.0)

## 2011-01-13 LAB — BASIC METABOLIC PANEL
Calcium: 8.7 mg/dL (ref 8.4–10.5)
Creatinine, Ser: 0.8 mg/dL (ref 0.4–1.5)
GFR: 115.01 mL/min (ref >60.00)
Sodium: 137 mEq/L (ref 135–145)

## 2011-01-13 LAB — HEPATIC FUNCTION PANEL
ALT: 21 U/L (ref 0–53)
AST: 22 U/L (ref 0–37)
Albumin: 3.8 g/dL (ref 3.5–5.2)
Alkaline Phosphatase: 51 U/L (ref 39–117)
Total Protein: 6.4 g/dL (ref 6.0–8.3)

## 2011-01-16 ENCOUNTER — Telehealth: Payer: Self-pay | Admitting: *Deleted

## 2011-01-16 NOTE — Telephone Encounter (Signed)
Message copied by Lorayne Bender on Mon Jan 16, 2011  2:11 PM ------      Message from: Swaziland, PETER M      Created: Sat Jan 14, 2011  3:35 PM       Triglycerides are much better. Continue lipitor and fibrate RX. Continue fish oil. Keep weight under control.      Theron Arista Swaziland

## 2011-01-16 NOTE — Telephone Encounter (Signed)
Lm w/lab results. Will send copy to Dr. Jacky Kindle

## 2011-05-12 ENCOUNTER — Other Ambulatory Visit: Payer: Self-pay

## 2011-05-12 MED ORDER — LISINOPRIL 10 MG PO TABS: 10.0000 mg | ORAL_TABLET | Freq: Every day | ORAL | Status: DC

## 2011-05-15 ENCOUNTER — Other Ambulatory Visit: Payer: Self-pay | Admitting: *Deleted

## 2011-05-23 ENCOUNTER — Ambulatory Visit (INDEPENDENT_AMBULATORY_CARE_PROVIDER_SITE_OTHER): Payer: BC Managed Care – PPO | Admitting: Nurse Practitioner

## 2011-05-23 ENCOUNTER — Other Ambulatory Visit: Payer: Self-pay

## 2011-05-23 ENCOUNTER — Encounter: Payer: Self-pay | Admitting: Nurse Practitioner

## 2011-05-23 ENCOUNTER — Encounter (HOSPITAL_COMMUNITY): Payer: Self-pay | Admitting: Pharmacy Technician

## 2011-05-23 ENCOUNTER — Ambulatory Visit
Admission: RE | Admit: 2011-05-23 | Discharge: 2011-05-23 | Disposition: A | Discharge status: Home / Self Care | Payer: BC Managed Care – PPO | Source: Ambulatory Visit | Attending: Nurse Practitioner | Admitting: Nurse Practitioner

## 2011-05-23 VITALS — BP 128/68 | HR 91 | Ht 72.0 in | Wt 214.0 lb

## 2011-05-23 DIAGNOSIS — E785 Hyperlipidemia, unspecified: Secondary | ICD-10-CM

## 2011-05-23 DIAGNOSIS — Z01818 Encounter for other preprocedural examination: Secondary | ICD-10-CM

## 2011-05-23 DIAGNOSIS — I2589 Other forms of chronic ischemic heart disease: Secondary | ICD-10-CM

## 2011-05-23 DIAGNOSIS — I255 Ischemic cardiomyopathy: Secondary | ICD-10-CM

## 2011-05-23 LAB — CBC WITH DIFFERENTIAL/PLATELET
Basophils Absolute: 0 10*3/uL (ref 0.0–0.1)
Basophils Relative: 0.4 % (ref 0.0–3.0)
Eosinophils Absolute: 0.1 10*3/uL (ref 0.0–0.7)
Eosinophils Relative: 2 % (ref 0.0–5.0)
HCT: 44.5 % (ref 39.0–52.0)
Hemoglobin: 15.3 g/dL (ref 13.0–17.0)
Lymphocytes Relative: 28.2 % (ref 12.0–46.0)
Lymphs Abs: 1.5 10*3/uL (ref 0.7–4.0)
MCHC: 34.4 g/dL (ref 30.0–36.0)
MCV: 101.2 fl — ABNORMAL HIGH (ref 78.0–100.0)
Monocytes Absolute: 0.5 10*3/uL (ref 0.1–1.0)
Monocytes Relative: 9.1 % (ref 3.0–12.0)
Neutro Abs: 3.2 10*3/uL (ref 1.4–7.7)
Neutrophils Relative %: 60.3 % (ref 43.0–77.0)
Platelets: 223 10*3/uL (ref 150.0–400.0)
RBC: 4.4 Mil/uL (ref 4.22–5.81)
RDW: 13.4 % (ref 11.5–14.6)
WBC: 5.3 10*3/uL (ref 4.5–10.5)

## 2011-05-23 LAB — BASIC METABOLIC PANEL
BUN: 11 mg/dL (ref 6–23)
CO2: 25 mEq/L (ref 19–32)
Calcium: 9.4 mg/dL (ref 8.4–10.5)
Chloride: 106 mEq/L (ref 96–112)
Creatinine, Ser: 0.9 mg/dL (ref 0.4–1.5)
GFR: 93.06 mL/min (ref >60.00)
Glucose, Bld: 109 mg/dL — ABNORMAL HIGH (ref 70–99)
Potassium: 4.6 mEq/L (ref 3.5–5.1)
Sodium: 140 mEq/L (ref 135–145)

## 2011-05-23 LAB — LIPID PANEL
Cholesterol: 204 mg/dL — ABNORMAL HIGH (ref 0–200)
HDL: 45.7 mg/dL (ref >39.00)
Total CHOL/HDL Ratio: 4
Triglycerides: 237 mg/dL — ABNORMAL HIGH (ref 0.0–149.0)
VLDL: 47.4 mg/dL — ABNORMAL HIGH (ref 0.0–40.0)

## 2011-05-23 LAB — HEPATIC FUNCTION PANEL
ALT: 31 U/L (ref 0–53)
AST: 28 U/L (ref 0–37)
Albumin: 4 g/dL (ref 3.5–5.2)
Alkaline Phosphatase: 50 U/L (ref 39–117)
Bilirubin, Direct: 0.1 mg/dL (ref 0.0–0.3)
Total Bilirubin: 0.7 mg/dL (ref 0.3–1.2)
Total Protein: 6.7 g/dL (ref 6.0–8.3)

## 2011-05-23 LAB — PROTIME-INR
INR: 0.9 ratio (ref 0.8–1.0)
Prothrombin Time: 9.7 s — ABNORMAL LOW (ref 10.2–12.4)

## 2011-05-23 LAB — APTT: aPTT: 23.9 s (ref 21.7–28.8)

## 2011-05-23 LAB — BRAIN NATRIURETIC PEPTIDE: Pro B Natriuretic peptide (BNP): 62 pg/mL (ref 0.0–100.0)

## 2011-05-23 LAB — LDL CHOLESTEROL, DIRECT: Direct LDL: 132.5 mg/dL

## 2011-05-23 MED ORDER — NITROGLYCERIN 0.4 MG SL SUBL: 0.4000 mg | SUBLINGUAL_TABLET | SUBLINGUAL | Status: DC | PRN

## 2011-05-23 MED ORDER — ROSUVASTATIN CALCIUM 10 MG PO TABS: 10.0000 mg | ORAL_TABLET | Freq: Every day | ORAL | Status: DC

## 2011-05-23 NOTE — Progress Notes (Signed)
 [name] Date of Birth: 01/16/1956 Medical Record #[id number]  History of Present Illness: [name] is seen today for a work in visit. He is seen for Dr. Jordan. He has an ischemic cardiomyopathy with remote anterior MI and emergent CABG in 2006 x 3. Last nuclear study was in 2010 showing prior inferior and inferior lateral infarct. EF was 36%. He has been maintained on Coreg and ACE.   He comes in today. He is here with his wife. He has not felt well for the past one month. He "can't do anything" without having shortness of breath and chest tightness. It is getting worse. It is exertional related. Does not have any NTG on hand. He notes that he now just "wears out" with the least bit of exertion which is very unusual for him. He has been taking his medicines but stopped his statin almost a year ago. Did not tolerate Vytorin. Has never tried any other statin.   Current Outpatient Prescriptions on File Prior to Visit  Medication Sig Dispense Refill  . carvedilol (COREG) 12.5 MG tablet Take 1 tablet (12.5 mg total) by mouth 2 (two) times daily with a meal.  60 tablet  5  . lisinopril (PRINIVIL,ZESTRIL) 10 MG tablet Take 1 tablet (10 mg total) by mouth daily.  30 tablet  5  . metFORMIN (GLUCOPHAGE) 500 MG tablet Take 500 mg by mouth 2 (two) times daily with a meal.        . nitroGLYCERIN (NITROSTAT) 0.4 MG SL tablet Place 1 tablet (0.4 mg total) under the tongue every 5 (five) minutes as needed for chest pain.  25 tablet  6  . DISCONTD: aspirin 325 MG tablet Take 81 mg by mouth daily.         No Known Allergies  Past Medical History  Diagnosis Date  . MI, old 2006    anterior MI in 2006  . Ischemic cardiomyopathy     EF 36%; prior anterior MI, s/p CABG x 3  . LV dysfunction     MODERATE TO SEVERE  . Hyperlipidemia   . Hemochromatosis   . RBBB (right bundle branch block)   . Rotator cuff injury   . Diabetes mellitus   . Sleep apnea   . PAF (paroxysmal atrial fibrillation)   .  Anemia     Past Surgical History  Procedure Date  . Cardiac catheterization 03/12/2005    INFERIOR WALL AKINESIA. THERE IS GLOBAL HYPOKINESIA WITH OVERALL MODERATE TO SEVERE LEFT VENTRICULAR SYSTOLIC DYSFUNCTION. EF 35%  . Coronary artery bypass graft 2006    Emergent CABG x 3 with LIMA to LAD, SVG to OM, SVG to distal RCA per Dr. Jerrell Weaver  . Shoulder surgery   . Rotator cuff repair     History  Smoking status  . Former Smoker -- 2.0 packs/day for 30 years  . Types: Cigarettes  . Quit date: 03/11/2005  Smokeless tobacco  . Never Used    History  Alcohol Use No    Family History  Problem Relation Age of Onset  . Adopted: Yes    Review of Systems: The review of systems is per the HPI. No dizziness. No symptoms at rest.  All other systems were reviewed and are negative.  Physical Exam: BP 128/68  Pulse 91  Ht 6' (1.829 m)  Wt 214 lb (97.07 kg)  BMI 29.02 kg/m2 Patient is very pleasant and in no acute distress. Skin is warm and dry. Color is normal.  HEENT is   unremarkable. Normocephalic/atraumatic. PERRL. Sclera are nonicteric. Neck is supple. No masses. No JVD. Lungs are clear. Cardiac exam shows a regular rate and rhythm. Abdomen is soft. Extremities are without edema. Gait and ROM are intact. No gross neurologic deficits noted.   LABORATORY DATA: EKG today shows sinus rhythm, R BBB, rate of 91 bpm with evidence of old inferior infarct. Tracing is reviewed with Dr. Jordan.    Assessment / Plan:  

## 2011-05-23 NOTE — Assessment & Plan Note (Addendum)
Patient has had prior MI with emergent CABG x 3 back in 2006. Now presenting with worsening DOE/chest tightness with exertion. Patient was seen with Dr. Swaziland. It is felt that cardiac catheterization is warranted to further discern his situation. Procedure is scheduled for this Thursday, February 14th. The procedure, risks and benefits have been reviewed and he is willing to proceed. Stopping his metformin as of today. Have sent a RX for sl NTG to the drug store. He is to refrain from working. Note was given for his employer. Will tentatively see back in 2 weeks for a post hospital visit. If EF remains low would consider trying to up titrate his medicines and possibly refer for ICD implant. Patient is agreeable to this plan and will call if any problems develop in the interim.

## 2011-05-23 NOTE — Patient Instructions (Signed)
We are going to arrange for a heart catheterization with Dr. Swaziland on Thursday.  You are scheduled for a cardiac catheterization on Thursday with Dr. Swaziland or associate.  Go to [facility] 2nd Floor Short Stay on Thursday, February 14th at 12:30 pm. Your procedure is scheduled for 2:30 pm No food or drink after midnight on Wednesday. You may take your medications with a sip of water on the day of your procedure.   No more metformin after today.   Go to [facility] for a CXR today. You may walk in.  We are going to check your labs today.  I have sent a prescription for NTG to the drug store.   Use your NTG under your tongue for recurrent chest pain. May take one tablet every 5 minutes. If you are still having discomfort after 3 tablets in 15 minutes, call 911.  Call the [facility] office at [phone] if you have any questions, problems or concerns.   Coronary Angiography Coronary angiography is an X-ray procedure used to look at the arteries in the heart. In this procedure, a dye is injected through a long, hollow tube (catheter). The catheter is about the size of a piece of cooked spaghetti. The catheter injects a dye into an artery in your groin. X-rays are then taken to show if there is a blockage in the arteries of your heart. BEFORE THE PROCEDURE   Let your caregiver know if you have allergies to shellfish or contrast dye. Also let your caregiver know if you have kidney problems or failure.   Do not eat or drink starting from midnight up to the time of the procedure, or as directed.   You may drink enough water to take your medications the morning of the procedure if you were instructed to do so.   You should be at the hospital or outpatient facility where the procedure is to be done 60 minutes prior to the procedure or as directed.  PROCEDURE  You may be given an IV medication to help you relax before the procedure.   You will be  prepared for the procedure by washing and shaving the area where the catheter will be inserted. This is usually done in the groin but may be done in the fold of your arm by your elbow.   A medicine will be given to numb your groin where the catheter will be inserted.   A specially trained doctor will insert the catheter into an artery in your groin. The catheter is guided by using a special type of X-ray (fluoroscopy) to the blood vessel being examined.   A special dye is then injected into the catheter and X-rays are taken. The dye helps to show where any narrowing or blockages are located in the heart arteries.  AFTER THE PROCEDURE   After the procedure you will be kept in bed lying flat for several hours. You will be instructed to not bend or cross your legs.   The groin insertion site will be watched and checked frequently.   The pulse in your feet will be checked frequently.   Additional blood tests, X-rays and an EKG may be done.   You may stay in the hospital overnight for observation.  SEEK IMMEDIATE MEDICAL CARE IF:   You develop chest pain, shortness of breath, feel faint, or pass out.   There is bleeding, swelling, or drainage from the catheter insertion site.   You develop pain,  discoloration, coldness, or severe bruising in the leg or area where the catheter was inserted.   You have a fever.  Document Released: 10/01/2002 Document Revised: 12/07/2010 Document Reviewed: 11/20/2007 [facility] Patient Information 2012 [place], [place].

## 2011-05-24 ENCOUNTER — Ambulatory Visit: Payer: BC Managed Care – PPO | Admitting: Nurse Practitioner

## 2011-05-25 ENCOUNTER — Ambulatory Visit (HOSPITAL_COMMUNITY)
Admission: RE | Admit: 2011-05-25 | Discharge: 2011-05-25 | Disposition: A | Discharge status: Home / Self Care | Payer: BC Managed Care – PPO | Source: Ambulatory Visit | Attending: Cardiology | Admitting: Cardiology

## 2011-05-25 ENCOUNTER — Encounter (HOSPITAL_COMMUNITY)
Admission: RE | Disposition: A | Discharge status: Home / Self Care | Payer: Self-pay | Source: Ambulatory Visit | Attending: Cardiology

## 2011-05-25 DIAGNOSIS — Z01818 Encounter for other preprocedural examination: Secondary | ICD-10-CM

## 2011-05-25 DIAGNOSIS — R0602 Shortness of breath: Secondary | ICD-10-CM | POA: Insufficient documentation

## 2011-05-25 DIAGNOSIS — I252 Old myocardial infarction: Secondary | ICD-10-CM | POA: Insufficient documentation

## 2011-05-25 DIAGNOSIS — I2589 Other forms of chronic ischemic heart disease: Secondary | ICD-10-CM | POA: Insufficient documentation

## 2011-05-25 DIAGNOSIS — I4510 Unspecified right bundle-branch block: Secondary | ICD-10-CM | POA: Insufficient documentation

## 2011-05-25 DIAGNOSIS — Z951 Presence of aortocoronary bypass graft: Secondary | ICD-10-CM | POA: Insufficient documentation

## 2011-05-25 DIAGNOSIS — E119 Type 2 diabetes mellitus without complications: Secondary | ICD-10-CM | POA: Insufficient documentation

## 2011-05-25 DIAGNOSIS — I2510 Atherosclerotic heart disease of native coronary artery without angina pectoris: Secondary | ICD-10-CM

## 2011-05-25 HISTORY — PX: LEFT HEART CATHETERIZATION WITH CORONARY/GRAFT ANGIOGRAM: SHX5450

## 2011-05-25 LAB — GLUCOSE, CAPILLARY: Glucose-Capillary: 93 mg/dL (ref 70–99)

## 2011-05-25 SURGERY — LEFT HEART CATHETERIZATION WITH CORONARY/GRAFT ANGIOGRAM
Anesthesia: LOCAL

## 2011-05-25 MED ORDER — MIDAZOLAM HCL 2 MG/2ML IJ SOLN
INTRAMUSCULAR | Status: AC
  Filled 2011-05-25: qty 2

## 2011-05-25 MED ORDER — SODIUM CHLORIDE 0.9 % IV SOLN: 250.0000 mL | INTRAVENOUS | Status: DC | PRN

## 2011-05-25 MED ORDER — ACETAMINOPHEN 325 MG PO TABS: 650.0000 mg | ORAL_TABLET | ORAL | Status: DC | PRN

## 2011-05-25 MED ORDER — SODIUM CHLORIDE 0.9 % IV SOLN: INTRAVENOUS | Status: DC

## 2011-05-25 MED ORDER — SODIUM CHLORIDE 0.9 % IV SOLN: 1.0000 mL/kg/h | INTRAVENOUS | Status: DC

## 2011-05-25 MED ORDER — ASPIRIN EC 81 MG PO TBEC: 81.0000 mg | DELAYED_RELEASE_TABLET | Freq: Every day | ORAL | Status: DC

## 2011-05-25 MED ORDER — SODIUM CHLORIDE 0.9 % IJ SOLN: 3.0000 mL | INTRAMUSCULAR | Status: DC | PRN

## 2011-05-25 MED ORDER — METFORMIN HCL 500 MG PO TABS: 500.0000 mg | ORAL_TABLET | Freq: Two times a day (BID) | ORAL | Status: DC

## 2011-05-25 MED ORDER — ROSUVASTATIN CALCIUM 40 MG PO TABS
40.0000 mg | ORAL_TABLET | Freq: Every day | ORAL | Status: DC
  Filled 2011-05-25: qty 1

## 2011-05-25 MED ORDER — CARVEDILOL 12.5 MG PO TABS: 12.5000 mg | ORAL_TABLET | Freq: Two times a day (BID) | ORAL | Status: DC

## 2011-05-25 MED ORDER — SODIUM CHLORIDE 0.9 % IJ SOLN: 3.0000 mL | Freq: Two times a day (BID) | INTRAMUSCULAR | Status: DC

## 2011-05-25 MED ORDER — ROSUVASTATIN CALCIUM 10 MG PO TABS: 10.0000 mg | ORAL_TABLET | Freq: Every day | ORAL | Status: DC

## 2011-05-25 MED ORDER — NITROGLYCERIN 0.4 MG SL SUBL: 0.4000 mg | SUBLINGUAL_TABLET | SUBLINGUAL | Status: DC | PRN

## 2011-05-25 MED ORDER — LIDOCAINE HCL (PF) 1 % IJ SOLN
INTRAMUSCULAR | Status: AC
  Filled 2011-05-25: qty 30

## 2011-05-25 MED ORDER — HEPARIN (PORCINE) IN NACL 2-0.9 UNIT/ML-% IJ SOLN
INTRAMUSCULAR | Status: AC
  Filled 2011-05-25: qty 2000

## 2011-05-25 MED ORDER — DIAZEPAM 5 MG PO TABS
ORAL_TABLET | ORAL | Status: AC
  Administered 2011-05-25: 10 mg via ORAL
  Filled 2011-05-25: qty 2

## 2011-05-25 MED ORDER — ASPIRIN 81 MG PO CHEW
324.0000 mg | CHEWABLE_TABLET | ORAL | Status: AC
  Administered 2011-05-25: 324 mg via ORAL

## 2011-05-25 MED ORDER — ONDANSETRON HCL 4 MG/2ML IJ SOLN: 4.0000 mg | Freq: Four times a day (QID) | INTRAMUSCULAR | Status: DC | PRN

## 2011-05-25 MED ORDER — LISINOPRIL 10 MG PO TABS: 10.0000 mg | ORAL_TABLET | Freq: Every day | ORAL | Status: DC

## 2011-05-25 MED ORDER — DIAZEPAM 5 MG PO TABS
10.0000 mg | ORAL_TABLET | ORAL | Status: AC
  Administered 2011-05-25: 10 mg via ORAL

## 2011-05-25 MED ORDER — FENTANYL CITRATE 0.05 MG/ML IJ SOLN
INTRAMUSCULAR | Status: AC
  Filled 2011-05-25: qty 2

## 2011-05-25 MED ORDER — ASPIRIN 81 MG PO CHEW
CHEWABLE_TABLET | ORAL | Status: AC
  Administered 2011-05-25: 324 mg via ORAL
  Filled 2011-05-25: qty 4

## 2011-05-25 MED ORDER — ROSUVASTATIN CALCIUM 40 MG PO TABS
40.0000 mg | ORAL_TABLET | ORAL | Status: DC
  Filled 2011-05-25 (×2): qty 1

## 2011-05-25 NOTE — Interval H&P Note (Signed)
History and Physical Interval Note:  05/25/2011 2:09 PM  [name]  has presented today for surgery, with the diagnosis of chest pain  The various methods of treatment have been discussed with the patient and family. After consideration of risks, benefits and other options for treatment, the patient has consented to  Procedure(s) (LRB): LEFT HEART CATHETERIZATION WITH CORONARY/GRAFT ANGIOGRAM (N/A) as a surgical intervention .  The patients' history has been reviewed, patient examined, no change in status, stable for surgery.  I have reviewed the patients' chart and labs.  Questions were answered to the patient's satisfaction.     Thedora Hinders, [facility] 05/25/2011 2:09 PM

## 2011-05-25 NOTE — Discharge Instructions (Addendum)
Increase lisinopril to 20 mg daily.   Add Lasix 40 mg daily. Prescription called in to [facility] drug.Groin Site Care Refer to this sheet in the next few weeks. These instructions provide you with information on caring for yourself after your procedure. Your caregiver may also give you more specific instructions. Your treatment has been planned according to current medical practices, but problems sometimes occur. Call your caregiver if you have any problems or questions after your procedure. HOME CARE INSTRUCTIONS  You may shower 24 hours after the procedure. Remove the bandage (dressing) and gently wash the site with plain soap and water. Gently pat the site dry.   Do not apply powder or lotion to the site.   Do not sit in a bathtub, swimming pool, or whirlpool for 5 to 7 days.   No bending, squatting, or lifting anything over 10 pounds (4.5 kg) as directed by your caregiver.   Inspect the site at least twice daily.   Do not drive home if you are discharged the same day of the procedure. Have someone else drive you.   You may drive 24 hours after the procedure unless otherwise instructed by your caregiver.  What to expect:  Any bruising will usually fade within 1 to 2 weeks.   Blood that collects in the tissue (hematoma) may be painful to the touch. It should usually decrease in size and tenderness within 1 to 2 weeks.  SEEK IMMEDIATE MEDICAL CARE IF:  You have unusual pain at the groin site or down the affected leg.   You have redness, warmth, swelling, or pain at the groin site.   You have drainage (other than a small amount of blood on the dressing).   You have chills.   You have a fever or persistent symptoms for more than 72 hours.   You have a fever and your symptoms suddenly get worse.   Your leg becomes pale, cool, tingly, or numb.   You have heavy bleeding from the site. Hold pressure on the site.  Document Released: 04/29/2010 Document Revised: 12/07/2010 Document  Reviewed: 04/29/2010 [facility] Patient Information 2012 [place], [place].   NO METFORMIN FOR 2DAYS

## 2011-05-25 NOTE — Op Note (Signed)
   Cardiac Catheterization Procedure Note  Name: [name] MRN: [id number] DOB: 02-29-1956  Procedure: Left Heart Cath, Selective Coronary Angiography, LV angiography, saphenous vein graft angiography, LIMA graft angiography.  Indication: 56 year old white male with prior inferior myocardial infarction. He status post coronary bypass surgery in 2006. He presents with symptoms of increased dyspnea and chest pain on exertion. He has progressive fatigue.   Procedural details: The right groin was prepped, draped, and anesthetized with 1% lidocaine. Using modified Seldinger technique, a 5 French sheath was introduced into the right femoral artery. Standard Judkins catheters were used for coronary angiography and left ventriculography. Catheter exchanges were performed over a guidewire. There were no immediate procedural complications. The patient was transferred to the post catheterization recovery area for further monitoring.  Procedural Findings: Hemodynamics:  AO 121/72 with a mean of 90 mm of mercury LV 121/24 mmHg   Coronary angiography: Coronary dominance: right  Left mainstem: Left main coronary is mildly calcified with 20% narrowing in the distal vessel.  Left anterior descending (LAD): The LAD is diffusely calcified in the proximal to mid vessel. There are sequential 90% stenoses in the mid vessel with competitive flow distally from the IMA graft. The proximal LAD has diffuse 40% disease. The first diagonal branch is a modest size vessel with scattered irregularities less than 30%.  There is a relatively small ramus intermediate branch which has an 80% stenosis proximally.  Left circumflex (LCx): There is diffuse 30% disease in the proximal circumflex. It is occluded after the takeoff of the first marginal branch. The first marginal branch is relatively small without significant disease.  Right coronary artery (RCA): The right coronary is occluded at the origin. There are  faint right to right collaterals to the distal vessel.  The saphenous vein graft to the distal right coronary is patent but very small in caliber throughout. No focal obstructive lesions noted. The target vessel is very small in caliber diffusely diseased.  The saphenous vein graft to the second obtuse marginal vessel is also very small in caliber throughout. No focal obstructive disease is noted. The target vessel is also small in caliber.   The LIMA graft to the LAD is a large graft with excellent flow. It inserts into the mid LAD with excellent distal flow. No significant disease is noted in the distal LAD. There are faint collaterals to the distal right coronary area  Left ventriculography: Left ventricular size is moderate to severely enlarged. There is severe global hypokinesis with akinesis of the inferior wall. The inferior wall appears diffusely aneurysmal.   Final Conclusions:   1. Severe 3 vessel obstructive atherosclerotic coronary disease. 2. Patent saphenous vein graft to the distal right coronary and patent saphenous vein graft to the second obtuse marginal vessel. These grafts are very small in caliber due to small target vessels. 3. Patent LIMA graft to LAD. 4. Severe left ventricular dysfunction.   Recommendations:  recommend aggressive medical therapy with up titration of his cardiac medications for congestive heart failure. He will need to be considered for ICD/biventricular pacing.  [name] 05/25/2011, 2:44 PM

## 2011-05-25 NOTE — H&P (View-Only) (Signed)
[name] Date of Birth: 07/03/1955 Medical Record #[id number]  History of Present Illness: [name] is seen today for a work in visit. He is seen for Dr. Swaziland. He has an ischemic cardiomyopathy with remote anterior MI and emergent CABG in 2006 x 3. Last nuclear study was in 2010 showing prior inferior and inferior lateral infarct. EF was 36%. He has been maintained on Coreg and ACE.   He comes in today. He is here with his wife. He has not felt well for the past one month. He "can't do anything" without having shortness of breath and chest tightness. It is getting worse. It is exertional related. Does not have any NTG on hand. He notes that he now just "wears out" with the least bit of exertion which is very unusual for him. He has been taking his medicines but stopped his statin almost a year ago. Did not tolerate Vytorin. Has never tried any other statin.   Current Outpatient Prescriptions on File Prior to Visit  Medication Sig Dispense Refill  . carvedilol (COREG) 12.5 MG tablet Take 1 tablet (12.5 mg total) by mouth 2 (two) times daily with a meal.  60 tablet  5  . lisinopril (PRINIVIL,ZESTRIL) 10 MG tablet Take 1 tablet (10 mg total) by mouth daily.  30 tablet  5  . metFORMIN (GLUCOPHAGE) 500 MG tablet Take 500 mg by mouth 2 (two) times daily with a meal.        . nitroGLYCERIN (NITROSTAT) 0.4 MG SL tablet Place 1 tablet (0.4 mg total) under the tongue every 5 (five) minutes as needed for chest pain.  25 tablet  6  . DISCONTD: aspirin 325 MG tablet Take 81 mg by mouth daily.         No Known Allergies  Past Medical History  Diagnosis Date  . MI, old 2006    anterior MI in 2006  . Ischemic cardiomyopathy     EF 36%; prior anterior MI, s/p CABG x 3  . LV dysfunction     MODERATE TO SEVERE  . Hyperlipidemia   . Hemochromatosis   . RBBB (right bundle branch block)   . Rotator cuff injury   . Diabetes mellitus   . Sleep apnea   . PAF (paroxysmal atrial fibrillation)   .  Anemia     Past Surgical History  Procedure Date  . Cardiac catheterization 03/12/2005    INFERIOR WALL AKINESIA. THERE IS GLOBAL HYPOKINESIA WITH OVERALL MODERATE TO SEVERE LEFT VENTRICULAR SYSTOLIC DYSFUNCTION. EF 35%  . Coronary artery bypass graft 2006    Emergent CABG x 3 with LIMA to LAD, SVG to OM, SVG to distal RCA per Dr. Tyrone Sage  . Shoulder surgery   . Rotator cuff repair     History  Smoking status  . Former Smoker -- 2.0 packs/day for 30 years  . Types: Cigarettes  . Quit date: 03/11/2005  Smokeless tobacco  . Never Used    History  Alcohol Use No    Family History  Problem Relation Age of Onset  . Adopted: Yes    Review of Systems: The review of systems is per the HPI. No dizziness. No symptoms at rest.  All other systems were reviewed and are negative.  Physical Exam: BP 128/68  Pulse 91  Ht 6' (1.829 m)  Wt 214 lb (97.07 kg)  BMI 29.02 kg/m2 Patient is very pleasant and in no acute distress. Skin is warm and dry. Color is normal.  HEENT is  unremarkable. Normocephalic/atraumatic. PERRL. Sclera are nonicteric. Neck is supple. No masses. No JVD. Lungs are clear. Cardiac exam shows a regular rate and rhythm. Abdomen is soft. Extremities are without edema. Gait and ROM are intact. No gross neurologic deficits noted.   LABORATORY DATA: EKG today shows sinus rhythm, R BBB, rate of 91 bpm with evidence of old inferior infarct. Tracing is reviewed with Dr. Swaziland.    Assessment / Plan:

## 2011-06-06 ENCOUNTER — Encounter: Payer: Self-pay | Admitting: Nurse Practitioner

## 2011-06-06 ENCOUNTER — Ambulatory Visit (INDEPENDENT_AMBULATORY_CARE_PROVIDER_SITE_OTHER): Payer: BC Managed Care – PPO | Admitting: Nurse Practitioner

## 2011-06-06 DIAGNOSIS — I2589 Other forms of chronic ischemic heart disease: Secondary | ICD-10-CM

## 2011-06-06 DIAGNOSIS — I5020 Unspecified systolic (congestive) heart failure: Secondary | ICD-10-CM

## 2011-06-06 DIAGNOSIS — I255 Ischemic cardiomyopathy: Secondary | ICD-10-CM

## 2011-06-06 MED ORDER — CARVEDILOL 25 MG PO TABS: 25.0000 mg | ORAL_TABLET | Freq: Two times a day (BID) | ORAL | Status: DC

## 2011-06-06 NOTE — Patient Instructions (Signed)
We are going to increase the Coreg to 25 mg two times a day.  We are going to send you to see the EP doctor to discuss a pacemaker/ICD  We are going to get an ultrasound of your heart.  Apply for disability.  Weigh yourself each morning and record.  Limit sodium intake. Goal is to have less than 2000 mg (2gm) of salt per day.  Call the [facility] office at [phone] if you have any questions, problems or concerns.

## 2011-06-06 NOTE — Progress Notes (Signed)
[name] Date of Birth: 1955-12-28 Medical Record #[id number]  History of Present Illness: [name] is seen back today for a post cath visit. He is seen for Dr. Swaziland. He has had worsening DOE. Really not able to do very much in the way of activity. He was recathed. His grafts are patent. EF is 20 to 25%. Dr. Swaziland has advised him to apply for disability. Will be referring for BiV/ICD. Will be trying to uptitrate his medicines.  He comes in today. He feels the same. Gets tired very quickly. Still short of breath with minimal activity. No real chest pain. Blood pressure chronically running low. Not dizzy or lightheaded. No syncope. No problem with his groin.   Current Outpatient Prescriptions on File Prior to Visit  Medication Sig Dispense Refill  . aspirin EC 81 MG tablet Take 81 mg by mouth daily.      Marland Kitchen lisinopril (PRINIVIL,ZESTRIL) 10 MG tablet Take 20 mg by mouth daily.       . metFORMIN (GLUCOPHAGE) 500 MG tablet Take 500 mg by mouth 2 (two) times daily with a meal.        . nitroGLYCERIN (NITROSTAT) 0.4 MG SL tablet Place 0.4 mg under the tongue every 5 (five) minutes as needed. For chest pain      . rosuvastatin (CRESTOR) 10 MG tablet Take 1 tablet (10 mg total) by mouth at bedtime.  30 tablet  11    No Known Allergies  Past Medical History  Diagnosis Date  . MI, old 2006    anterior MI in 2006  . Ischemic cardiomyopathy     EF 36%; prior anterior MI, s/p CABG x 3; s/p cath Feb 2013 showing grafts to be patent with severe LV dysfunction  . LV dysfunction     MODERATE TO SEVERE; reportedly 20 to 25% PER CATH FEB 2013  . Hyperlipidemia   . Hemochromatosis   . RBBB (right bundle branch block)   . Rotator cuff injury   . Diabetes mellitus   . Sleep apnea   . PAF (paroxysmal atrial fibrillation)   . Anemia     Past Surgical History  Procedure Date  . Cardiac catheterization 03/12/2005    INFERIOR WALL AKINESIA. THERE IS GLOBAL HYPOKINESIA WITH OVERALL MODERATE TO  SEVERE LEFT VENTRICULAR SYSTOLIC DYSFUNCTION. EF 35%  . Coronary artery bypass graft 2006    Emergent CABG x 3 with LIMA to LAD, SVG to OM, SVG to distal RCA per Dr. Tyrone Sage  . Shoulder surgery   . Rotator cuff repair     History  Smoking status  . Former Smoker -- 2.0 packs/day for 30 years  . Types: Cigarettes  . Quit date: 03/11/2005  Smokeless tobacco  . Never Used    History  Alcohol Use No    Family History  Problem Relation Age of Onset  . Adopted: Yes    Review of Systems: The review of systems is per the HPI.  All other systems were reviewed and are negative.  Physical Exam: BP 118/84  Pulse 80  Ht 6' (1.829 m)  Wt 212 lb (96.163 kg)  BMI 28.75 kg/m2 Patient is very pleasant and in no acute distress. Skin is warm and dry. Color is normal.  HEENT is unremarkable. Normocephalic/atraumatic. PERRL. Sclera are nonicteric. Neck is supple. No masses. No JVD. Lungs are clear. Cardiac exam shows a regular rate and rhythm. Abdomen is soft. Extremities are without edema. Gait and ROM are intact. No gross neurologic deficits  noted.    Lab Results  Component Value Date   WBC 5.3 05/23/2011   HGB 15.3 05/23/2011   HCT 44.5 05/23/2011   PLT 223.0 05/23/2011   GLUCOSE 109* 05/23/2011   CHOL 204* 05/23/2011   TRIG 237.0* 05/23/2011   HDL 45.70 05/23/2011   LDLDIRECT 132.5 05/23/2011   LDLCALC 116* 01/13/2011   ALT 31 05/23/2011   AST 28 05/23/2011   NA 140 05/23/2011   K 4.6 05/23/2011   CL 106 05/23/2011   CREATININE 0.9 05/23/2011   BUN 11 05/23/2011   CO2 25 05/23/2011   INR 0.9 05/23/2011   HGBA1C 6.0 10/06/2010      Assessment / Plan:

## 2011-06-06 NOTE — Assessment & Plan Note (Signed)
His EF has dropped further. Grafts are patent and he is felt to be fully revascularized. He is already on ACE and beta blocker. Will try to increase the Coreg to 25 mg BID. We will arrange for an echo. We will go ahead and refer to EP for BiV/ICD implant. He is already in the process of applying for disability and we will fill out his forms today. We discussed activity level today. He is also to watch his salt and monitor his blood pressure at home. I will see him in 2 weeks. Patient is agreeable to this plan and will call if any problems develop in the interim.

## 2011-06-09 ENCOUNTER — Other Ambulatory Visit: Payer: Self-pay

## 2011-06-09 MED ORDER — CARVEDILOL 25 MG PO TABS: 25.0000 mg | ORAL_TABLET | Freq: Two times a day (BID) | ORAL | Status: DC

## 2011-06-13 ENCOUNTER — Other Ambulatory Visit: Payer: Self-pay | Admitting: Cardiology

## 2011-06-13 MED ORDER — LISINOPRIL 20 MG PO TABS: 20.0000 mg | ORAL_TABLET | Freq: Every day | ORAL | Status: DC

## 2011-06-19 ENCOUNTER — Ambulatory Visit (HOSPITAL_COMMUNITY): Payer: BC Managed Care – PPO | Attending: Cardiology

## 2011-06-19 ENCOUNTER — Other Ambulatory Visit: Payer: Self-pay

## 2011-06-19 DIAGNOSIS — E119 Type 2 diabetes mellitus without complications: Secondary | ICD-10-CM | POA: Insufficient documentation

## 2011-06-19 DIAGNOSIS — I2589 Other forms of chronic ischemic heart disease: Secondary | ICD-10-CM

## 2011-06-19 DIAGNOSIS — I252 Old myocardial infarction: Secondary | ICD-10-CM | POA: Insufficient documentation

## 2011-06-19 DIAGNOSIS — E785 Hyperlipidemia, unspecified: Secondary | ICD-10-CM | POA: Insufficient documentation

## 2011-06-19 DIAGNOSIS — R0609 Other forms of dyspnea: Secondary | ICD-10-CM | POA: Insufficient documentation

## 2011-06-19 DIAGNOSIS — R0989 Other specified symptoms and signs involving the circulatory and respiratory systems: Secondary | ICD-10-CM | POA: Insufficient documentation

## 2011-06-23 ENCOUNTER — Encounter: Payer: Self-pay | Admitting: Nurse Practitioner

## 2011-06-23 ENCOUNTER — Ambulatory Visit (INDEPENDENT_AMBULATORY_CARE_PROVIDER_SITE_OTHER): Payer: BC Managed Care – PPO | Admitting: Nurse Practitioner

## 2011-06-23 VITALS — BP 108/72 | HR 82 | Ht 72.0 in | Wt 216.0 lb

## 2011-06-23 DIAGNOSIS — I2589 Other forms of chronic ischemic heart disease: Secondary | ICD-10-CM

## 2011-06-23 DIAGNOSIS — I509 Heart failure, unspecified: Secondary | ICD-10-CM

## 2011-06-23 DIAGNOSIS — I255 Ischemic cardiomyopathy: Secondary | ICD-10-CM

## 2011-06-23 LAB — BASIC METABOLIC PANEL
BUN: 28 mg/dL — ABNORMAL HIGH (ref 6–23)
CO2: 24 mEq/L (ref 19–32)
Calcium: 9.2 mg/dL (ref 8.4–10.5)
Chloride: 95 mEq/L — ABNORMAL LOW (ref 96–112)
Creatinine, Ser: 1.3 mg/dL (ref 0.4–1.5)
GFR: 60.33 mL/min (ref >60.00)
Glucose, Bld: 136 mg/dL — ABNORMAL HIGH (ref 70–99)
Potassium: 4.4 mEq/L (ref 3.5–5.1)
Sodium: 133 mEq/L — ABNORMAL LOW (ref 135–145)

## 2011-06-23 MED ORDER — ALPRAZOLAM 0.25 MG PO TABS: 0.2500 mg | ORAL_TABLET | Freq: Three times a day (TID) | ORAL | Status: AC | PRN

## 2011-06-23 MED ORDER — SPIRONOLACTONE 25 MG PO TABS: ORAL_TABLET | ORAL | Status: DC

## 2011-06-23 NOTE — Assessment & Plan Note (Signed)
He continues with NYHA II to III symptoms. He is on a good regimen. I am going to try him on some low aldactone at 12. 5 mg daily. Will check a BMET today and repeat in one week. Hopefully his blood pressure will tolerate and he will continue to monitor at home. He is encouraged to proceed on with long term disability. We will give him some Xanax prn to help with his anxiety. He will see Dr. Graciela Husbands on the 26th for BiV/ICD referral. Patient is agreeable to this plan and will call if any problems develop in the interim.

## 2011-06-23 NOTE — Progress Notes (Signed)
[name] Date of Birth: [birth year] Medical Record #[id number]  History of Present Illness: [name] is seen back today for a 2 week check. He is seen for Dr. Swaziland. He has an ischemic CM with an EF of 20 to 25% per recent cath. Follow up echo has been reviewed by both Dr. Swaziland and Dr. Shirlee Latch and his EF is felt to be in the 30 to 35% range. At his last visit, we increased his Coreg to 25 mg BID. He will need BiV/ICD referral. He has been advised to seek disability. It is Dr. Elvis Coil feeling that he is not going to be able to return to work.   He comes in today. He is here with his wife. He remains very fatigued. Still with DOE. Does get a little lightheaded if he gets up too quickly or bends over. Blood pressure at home is in the 90 to 100's systolic. No chest pain. He is not sleeping. Very anxious and notes that he just can't turn his brain off.   Current Outpatient Prescriptions on File Prior to Visit  Medication Sig Dispense Refill  . aspirin EC 81 MG tablet Take 81 mg by mouth daily.      . carvedilol (COREG) 25 MG tablet Take 1 tablet (25 mg total) by mouth 2 (two) times daily.  60 tablet  1  . lisinopril (PRINIVIL,ZESTRIL) 20 MG tablet Take 1 tablet (20 mg total) by mouth daily.  30 tablet  6  . metFORMIN (GLUCOPHAGE) 500 MG tablet Take 500 mg by mouth 2 (two) times daily with a meal.        . nitroGLYCERIN (NITROSTAT) 0.4 MG SL tablet Place 0.4 mg under the tongue every 5 (five) minutes as needed. For chest pain      . rosuvastatin (CRESTOR) 10 MG tablet Take 1 tablet (10 mg total) by mouth at bedtime.  30 tablet  11    No Known Allergies  Past Medical History  Diagnosis Date  . MI, old 2006    anterior MI in 2006  . Ischemic cardiomyopathy     EF 36%; prior anterior MI, s/p CABG x 3; s/p cath Feb 2013 showing grafts to be patent with severe LV dysfunction  . LV dysfunction     MODERATE TO SEVERE; reportedly 20 to 25% PER CATH FEB 2013; repeat echo reviewed by Dr. Swaziland  and Shirlee Latch in 06/2011 with EF 30 to 35%  . Hyperlipidemia   . Hemochromatosis   . RBBB (right bundle branch block)   . Rotator cuff injury   . Diabetes mellitus   . Sleep apnea   . PAF (paroxysmal atrial fibrillation)   . Anemia     Past Surgical History  Procedure Date  . Cardiac catheterization 03/12/2005    INFERIOR WALL AKINESIA. THERE IS GLOBAL HYPOKINESIA WITH OVERALL MODERATE TO SEVERE LEFT VENTRICULAR SYSTOLIC DYSFUNCTION. EF 35%  . Coronary artery bypass graft 2006    Emergent CABG x 3 with LIMA to LAD, SVG to OM, SVG to distal RCA per Dr. Tyrone Sage  . Shoulder surgery   . Rotator cuff repair   . Cardiac catheterization Feb 2013    Grafts patent. EF is 20 to 25%.    History  Smoking status  . Former Smoker -- 2.0 packs/day for 30 years  . Types: Cigarettes  . Quit date: 03/11/2005  Smokeless tobacco  . Never Used    History  Alcohol Use No    Family History  Problem Relation  Age of Onset  . Adopted: Yes    Review of Systems: The review of systems is per the HPI.  All other systems were reviewed and are negative.  Physical Exam: BP 108/72  Pulse 82  Ht 6' (1.829 m)  Wt 216 lb (97.977 kg)  BMI 29.29 kg/m2 Patient is very pleasant and in no acute distress. Skin is warm and dry. Color is normal.  HEENT is unremarkable. Normocephalic/atraumatic. PERRL. Sclera are nonicteric. Neck is supple. No masses. No JVD. Lungs are clear. Cardiac exam shows a regular rate and rhythm. Abdomen is soft. Extremities are without edema. Gait and ROM are intact. No gross neurologic deficits noted.    LABORATORY DATA:   Assessment / Plan:

## 2011-06-23 NOTE — Patient Instructions (Addendum)
We are going to check lab today.  We are going to add Aldactone 25 mg to take only 1/2 tablet each morning.  You can use some Xanax to use as needed.   See Dr. Graciela Husbands on the 26th.  Recheck BMET in one week.  Dr. Swaziland will see you in a month.  Call the [facility] office at [phone] if you have any questions, problems or concerns.

## 2011-06-30 ENCOUNTER — Other Ambulatory Visit (INDEPENDENT_AMBULATORY_CARE_PROVIDER_SITE_OTHER): Payer: BC Managed Care – PPO

## 2011-06-30 DIAGNOSIS — I509 Heart failure, unspecified: Secondary | ICD-10-CM

## 2011-06-30 LAB — BASIC METABOLIC PANEL
BUN: 21 mg/dL (ref 6–23)
CO2: 25 mEq/L (ref 19–32)
Calcium: 9.3 mg/dL (ref 8.4–10.5)
Chloride: 99 mEq/L (ref 96–112)
Creatinine, Ser: 1.2 mg/dL (ref 0.4–1.5)
GFR: 68.73 mL/min (ref >60.00)
Glucose, Bld: 143 mg/dL — ABNORMAL HIGH (ref 70–99)
Potassium: 4.7 mEq/L (ref 3.5–5.1)
Sodium: 135 mEq/L (ref 135–145)

## 2011-07-04 ENCOUNTER — Ambulatory Visit: Payer: BC Managed Care – PPO | Admitting: Internal Medicine

## 2011-07-10 ENCOUNTER — Ambulatory Visit (INDEPENDENT_AMBULATORY_CARE_PROVIDER_SITE_OTHER): Payer: BC Managed Care – PPO | Admitting: Internal Medicine

## 2011-07-10 ENCOUNTER — Encounter: Payer: Self-pay | Admitting: Internal Medicine

## 2011-07-10 ENCOUNTER — Telehealth: Payer: Self-pay | Admitting: Cardiology

## 2011-07-10 VITALS — BP 89/51 | HR 85 | Ht 72.0 in | Wt 212.0 lb

## 2011-07-10 DIAGNOSIS — I4510 Unspecified right bundle-branch block: Secondary | ICD-10-CM

## 2011-07-10 DIAGNOSIS — I255 Ischemic cardiomyopathy: Secondary | ICD-10-CM

## 2011-07-10 DIAGNOSIS — R0989 Other specified symptoms and signs involving the circulatory and respiratory systems: Secondary | ICD-10-CM

## 2011-07-10 DIAGNOSIS — I2589 Other forms of chronic ischemic heart disease: Secondary | ICD-10-CM

## 2011-07-10 DIAGNOSIS — R0609 Other forms of dyspnea: Secondary | ICD-10-CM

## 2011-07-10 NOTE — Patient Instructions (Signed)
Your physician recommends that you schedule a follow-up appointment will depend on the cardiac MRI  Your physician has requested that you have a cardiac MRI. Cardiac MRI uses a computer to create images of your heart as its beating, producing both still and moving pictures of your heart and major blood vessels. For further information please visit [email or web address]. Please follow the instruction sheet given to you today for more information.

## 2011-07-10 NOTE — Assessment & Plan Note (Signed)
At catheterization his LVEDP was quite elevated. This would be consistent with a diagnosis of heart failure. However, I'm not sure that I understand the alacrity with which his become decompensated and Y. is a does not have evidence of volume overload otherwise. It occurs to me that he could potentially have exercise associated mitral regurgitation. Cardiopulmonary stress testing might be evaluable in this regard in conjunction with echocardiography.

## 2011-07-10 NOTE — Progress Notes (Signed)
CARDIOLOGY CONSULT NOTE  Patient ID: [name], MRN: [id number], DOB/AGE: 1956-04-10 56 y.o. Admit date: (Not on file) Date of Consult: 07/10/2011  Primary Physician: Minda Meo, MD, MD Primary Cardiologist: PPJ  Chief Complaint: bad heart   HPI [name] is a 56 y.o. male : Seen at the request of Dr. Swaziland andLori Gerhardt for consideration of ICD implantation for primary prevention.  He has a history of ischemic cardiomyopathy having suffered an MI 2006 in undergoing emergent CABG during that hospitalization. with ejection fraction 20-25% by catheterization Echocardiogram March 11 demonstrated ejection fraction of 45-50%   He has marked limitations in exercise tolerance. He is short of breath it lasted about 100 yards and he cannot climb a flight of stairs.  He has not had syncope. He denies palpitations.  He underwent catheterization in early February 2013--LVEDP was 24; patent vein graft with small targets.   Past Medical History  Diagnosis Date  . MI, old 2006    anterior MI in 2006  . Ischemic cardiomyopathy     EF 36%; prior anterior MI, s/p CABG x 3; s/p cath Feb 2013 showing grafts to be patent with severe LV dysfunction  . LV dysfunction     MODERATE TO SEVERE; reportedly 20 to 25% PER CATH FEB 2013; repeat echo reviewed by Dr. Swaziland and Shirlee Latch in 06/2011 with EF 30 to 35%  . Hyperlipidemia   . Hemochromatosis   . RBBB (right bundle branch block)   . Rotator cuff injury   . Diabetes mellitus   . Sleep apnea   . PAF (paroxysmal atrial fibrillation)   . Anemia       Surgical History:  Past Surgical History  Procedure Date  . Cardiac catheterization 03/12/2005    INFERIOR WALL AKINESIA. THERE IS GLOBAL HYPOKINESIA WITH OVERALL MODERATE TO SEVERE LEFT VENTRICULAR SYSTOLIC DYSFUNCTION. EF 35%  . Coronary artery bypass graft 2006    Emergent CABG x 3 with LIMA to LAD, SVG to OM, SVG to distal RCA per Dr. Tyrone Sage  . Shoulder surgery   .  Rotator cuff repair   . Cardiac catheterization Feb 2013    Grafts patent. EF is 20 to 25%.     Home Meds: Prior to Admission medications   Medication Sig Start Date End Date Taking? Authorizing Provider  ALPRAZolam (XANAX) 0.25 MG tablet Take 1 tablet (0.25 mg total) by mouth 3 (three) times daily as needed for sleep. 06/23/11 07/23/11 Yes Rosalio Macadamia, NP  aspirin EC 81 MG tablet Take 81 mg by mouth daily.   Yes Historical Provider, MD  carvedilol (COREG) 25 MG tablet Take 1 tablet (25 mg total) by mouth 2 (two) times daily. 06/09/11 06/08/12 Yes Peter M Swaziland, MD  furosemide (LASIX) 40 MG tablet Take 1 tablet by mouth Daily. 06/23/11  Yes Historical Provider, MD  lisinopril (PRINIVIL,ZESTRIL) 10 MG tablet Take 10 mg by mouth 2 (two) times daily.   Yes Historical Provider, MD  metFORMIN (GLUCOPHAGE) 500 MG tablet Take 500 mg by mouth 2 (two) times daily with a meal.     Yes Historical Provider, MD  nitroGLYCERIN (NITROSTAT) 0.4 MG SL tablet Place 0.4 mg under the tongue every 5 (five) minutes as needed. For chest pain   Yes Rosalio Macadamia, NP  rosuvastatin (CRESTOR) 10 MG tablet Take 1 tablet (10 mg total) by mouth at bedtime. 05/23/11 05/22/12 Yes Peter M [place], MD  spironolactone (ALDACTONE) 25 MG tablet Take only a half of a  tablet each morning. 06/23/11  Yes Rosalio Macadamia, NP      Allergies: No Known Allergies  History   Social History  . Marital Status: Married    Spouse Name: N/A    Number of Children: 1  . Years of Education: N/A   Occupational History  . telephone service    Social History Main Topics  . Smoking status: Former Smoker -- 2.0 packs/day for 30 years    Types: Cigarettes    Quit date: 03/11/2005  . Smokeless tobacco: Never Used  . Alcohol Use: No  . Drug Use: No  . Sexually Active: Yes   Other Topics Concern  . Not on file   Social History Narrative  . No narrative on file     Family History  Problem Relation Age of Onset  . Adopted: Yes      ROS:  Please see the history of present illness except for ongoing problems with myalgias related to statin therapy to All other systems reviewed and negative.    Physical Exam: Blood pressure 89/51, pulse 85, height 6' (1.829 m), weight 212 lb (96.163 kg). General: Well developed, well nourished male in no acute distress. Head: Normocephalic, atraumatic, sclera non-icteric, no xanthomas, nares are without discharge. Lymph Nodes:  none Neck: Negative for carotid bruits. JVD not elevated. Lungs: Clear bilaterally to auscultation without wheezes, rales, or rhonchi. Breathing is unlabored. Heart: RRR with S1 S2. No murmurs, rubs, or gallops appreciated. Abdomen: Soft, non-tender, non-distended with normoactive bowel sounds. No hepatomegaly. No rebound/guarding. No obvious abdominal masses. Msk:  Strength and tone appear normal for age. Extremities: No clubbing or cyanosis. No edema.  Distal pedal pulses are 2+ and equal bilaterally. Skin: Warm and Dry Neuro: Alert and oriented X 3. CN III-XII intact Grossly normal sensory and motor function . Psych:  Responds to questions appropriately with a normal affect.      Labs: Cardiac Enzymes No results found for this basename: CKTOTAL:4,CKMB:4,TROPONINI:4 in the last 72 hours CBC Lab Results  Component Value Date   WBC 5.3 05/23/2011   HGB 15.3 05/23/2011   HCT 44.5 05/23/2011   MCV 101.2* 05/23/2011   PLT 223.0 05/23/2011   PROTIME: No results found for this basename: LABPROT:3,INR:3 in the last 72 hours Chemistry No results found for this basename: NA,K,CL,CO2,BUN,CREATININE,CALCIUM,LABALBU,PROT,BILITOT,ALKPHOS,ALT,AST,GLUCOSE in the last 168 hours Lipids Lab Results  Component Value Date   CHOL 204* 05/23/2011   HDL 45.70 05/23/2011   LDLCALC 116* 01/13/2011   TRIG 237.0* 05/23/2011   BNP Pro B Natriuretic peptide (BNP)  Date/Time Value Range Status  05/23/2011 10:11 AM 62.0  0.0-100.0 (pg/mL) Final   Miscellaneous No results found  for this basename: DDIMER    Radiology/Studies:  No results found.  EKG: Sinus rhythm at 91 Intervals 0.17/0.15/0.39 Axis is 260 Right bundle branch block superior axis Inferior Q waves and poor R-wave progression   Assessment and Plan:

## 2011-07-10 NOTE — Telephone Encounter (Signed)
Mr. Diesel picked up his paperwork for [employer]. This paperwork was previous faxed on 06-30-2011 by [facility]. The patient also completed a ROI requesting copies of his records. This release was given to KDM.

## 2011-07-10 NOTE — Assessment & Plan Note (Signed)
The patient has right bundle branch block with a QRS duration of 152 ms. This would be a borderline indication for undertaking CRT. In the event that the patient's left ventricular function is in fact depressed, we will reassess ECG prior to proceeding with CRT

## 2011-07-10 NOTE — Assessment & Plan Note (Signed)
The patient has ischemic heart disease prior myocardial infarction and bypass surgery. There is unfortunately however a major discrepancy in assessment of left ventricular function with catheterization demonstrating 20-25% and echo demonstrating 45-50%. We'll plan to undertake an MRI to assess left ventricular function quantitatively.

## 2011-07-11 ENCOUNTER — Other Ambulatory Visit: Payer: BC Managed Care – PPO

## 2011-07-11 ENCOUNTER — Telehealth: Payer: Self-pay | Admitting: Internal Medicine

## 2011-07-11 NOTE — Telephone Encounter (Signed)
Pt Signed ROI, LOV x4,Echo,12 copied for pt he will pick up Today. 07/11/11/KM

## 2011-07-12 ENCOUNTER — Encounter: Payer: Self-pay | Admitting: Internal Medicine

## 2011-07-27 ENCOUNTER — Encounter: Payer: Self-pay | Admitting: Cardiology

## 2011-07-27 ENCOUNTER — Ambulatory Visit (INDEPENDENT_AMBULATORY_CARE_PROVIDER_SITE_OTHER): Payer: BC Managed Care – PPO | Admitting: Cardiology

## 2011-07-27 ENCOUNTER — Ambulatory Visit (HOSPITAL_COMMUNITY)
Admission: RE | Admit: 2011-07-27 | Discharge: 2011-07-27 | Disposition: A | Discharge status: Home / Self Care | Payer: BC Managed Care – PPO | Source: Ambulatory Visit | Attending: Internal Medicine | Admitting: Internal Medicine

## 2011-07-27 VITALS — BP 108/54 | HR 76 | Ht 72.0 in | Wt 212.0 lb

## 2011-07-27 DIAGNOSIS — I255 Ischemic cardiomyopathy: Secondary | ICD-10-CM

## 2011-07-27 DIAGNOSIS — I2589 Other forms of chronic ischemic heart disease: Secondary | ICD-10-CM

## 2011-07-27 DIAGNOSIS — I509 Heart failure, unspecified: Secondary | ICD-10-CM

## 2011-07-27 DIAGNOSIS — I519 Heart disease, unspecified: Secondary | ICD-10-CM | POA: Insufficient documentation

## 2011-07-27 DIAGNOSIS — I252 Old myocardial infarction: Secondary | ICD-10-CM

## 2011-07-27 DIAGNOSIS — I2510 Atherosclerotic heart disease of native coronary artery without angina pectoris: Secondary | ICD-10-CM

## 2011-07-27 DIAGNOSIS — I4510 Unspecified right bundle-branch block: Secondary | ICD-10-CM

## 2011-07-27 MED ORDER — GADOBENATE DIMEGLUMINE 529 MG/ML IV SOLN
30.0000 mL | Freq: Once | INTRAVENOUS | Status: AC
  Administered 2011-07-27: 30 mL via INTRAVENOUS

## 2011-07-27 NOTE — Progress Notes (Signed)
[name] Date of Birth: 06/05/1955 Medical Record #[id number]  History of Present Illness: [name] is seen back today for a followup visit. On his last visit he was started on Aldactone. He noted a significant drop in his blood pressure with this. He feels dizzy and lightheaded. His average blood pressure at home is 94/56 but has dropped as low as 80/51. He typically stays below 100. He still gets short of breath with any activity. He went to a show at the [facility] with his wife and had a very difficult time walking to his seat. He gives out easily. He denies any significant chest pain. He has been seen by Dr. Graciela Husbands for consideration of ICD/CRT therapy. He is scheduled for an MRI today to further evaluate his LV function.  Current Outpatient Prescriptions on File Prior to Visit  Medication Sig Dispense Refill  . aspirin EC 81 MG tablet Take 81 mg by mouth daily.      . carvedilol (COREG) 25 MG tablet Take 1 tablet (25 mg total) by mouth 2 (two) times daily.  60 tablet  1  . furosemide (LASIX) 40 MG tablet Take 1 tablet by mouth Daily.      Marland Kitchen lisinopril (PRINIVIL,ZESTRIL) 10 MG tablet Take 10 mg by mouth 2 (two) times daily.      . metFORMIN (GLUCOPHAGE) 500 MG tablet Take 500 mg by mouth 2 (two) times daily with a meal.        . nitroGLYCERIN (NITROSTAT) 0.4 MG SL tablet Place 0.4 mg under the tongue every 5 (five) minutes as needed. For chest pain      . rosuvastatin (CRESTOR) 10 MG tablet Take 1 tablet (10 mg total) by mouth at bedtime.  30 tablet  11  . spironolactone (ALDACTONE) 25 MG tablet Take only a half of a tablet each morning.  30 tablet  6    No Known Allergies  Past Medical History  Diagnosis Date  . MI, old 2006    anterior MI in 2006  . Ischemic cardiomyopathy     EF 36%; prior anterior MI, s/p CABG x 3; s/p cath Feb 2013 showing grafts to be patent with severe LV dysfunction  . LV dysfunction     MODERATE TO SEVERE; reportedly 20 to 25% PER CATH FEB 2013; repeat  echo reviewed by Dr. Swaziland and Shirlee Latch in 06/2011 with EF 30 to 35%  . Hyperlipidemia   . Hemochromatosis   . RBBB (right bundle branch block)   . Rotator cuff injury   . Diabetes mellitus   . Sleep apnea   . PAF (paroxysmal atrial fibrillation)   . Anemia     Past Surgical History  Procedure Date  . Cardiac catheterization 03/12/2005    INFERIOR WALL AKINESIA. THERE IS GLOBAL HYPOKINESIA WITH OVERALL MODERATE TO SEVERE LEFT VENTRICULAR SYSTOLIC DYSFUNCTION. EF 35%  . Coronary artery bypass graft 2006    Emergent CABG x 3 with LIMA to LAD, SVG to OM, SVG to distal RCA per Dr. Tyrone Sage  . Shoulder surgery   . Rotator cuff repair   . Cardiac catheterization Feb 2013    Grafts patent. EF is 20 to 25%.    History  Smoking status  . Former Smoker -- 2.0 packs/day for 30 years  . Types: Cigarettes  . Quit date: 03/11/2005  Smokeless tobacco  . Never Used    History  Alcohol Use No    Family History  Problem Relation Age of Onset  . Adopted:  Yes    Review of Systems: The review of systems is per the HPI. His wife thinks his breathing is a little bit better with the addition of Aldactone but at the cost of increased dizziness. All other systems were reviewed and are negative.  Physical Exam: BP 108/54  Pulse 76  Ht 6' (1.829 m)  Wt 212 lb (96.163 kg)  BMI 28.75 kg/m2 Patient is very pleasant and in no acute distress. Skin is warm and dry. Color is normal.  HEENT is unremarkable. Normocephalic/atraumatic. PERRL. Sclera are nonicteric. Neck is supple. No masses. No JVD. Lungs are clear. Cardiac exam shows a regular rate and rhythm. Abdomen is soft. Extremities are without edema. Gait and ROM are intact. No gross neurologic deficits noted.    LABORATORY DATA:   Assessment / Plan:

## 2011-07-27 NOTE — Assessment & Plan Note (Signed)
He has class III heart failure symptoms. This is improved marginally with the addition of Aldactone but he is now hypotensive. I recommended reducing his carvedilol to 12.5 mg twice a day. He will have an MRI of his heart today and this will help [place] decide whether to proceed with ICD/CRT therapy. If his ejection fraction is borderline than cardiopulmonary stress testing may be beneficial. We discussed his disability. I think at his current level of symptoms he will not be able to return to his prior employment.

## 2011-07-27 NOTE — Assessment & Plan Note (Signed)
Cardiac catheterization February 2013 showed continued patency of his grafts. His target vessels were very small. LV function was severely reduced.

## 2011-07-27 NOTE — Patient Instructions (Signed)
Reduce carvedilol to 12.5 mg twice a day.  We will await the results of your cardiac MRI.

## 2011-08-07 ENCOUNTER — Telehealth: Payer: Self-pay | Admitting: Cardiology

## 2011-08-07 DIAGNOSIS — I255 Ischemic cardiomyopathy: Secondary | ICD-10-CM

## 2011-08-07 NOTE — Telephone Encounter (Signed)
Fu call °Patient returning your call °

## 2011-08-07 NOTE — Telephone Encounter (Signed)
Patient called stated he had Cardiac MRI 07/27/11 and was calling to get results.Stated Dr.Klein ordered MRI.

## 2011-08-07 NOTE — Telephone Encounter (Signed)
With EF 31 % and QRS .>150 with RBBB it is reasonable to procveed with CRT-D implantation. Plz call and we can either arrange to see him in the office or not, depending on his preference thnks ----- Message ----- From: Jefferey Pica, RN Sent: 07/28/2011 3:06 PM To: Duke Salvia, MD  FYI- cardiac MRI states EF- 31%

## 2011-08-07 NOTE — Telephone Encounter (Signed)
Patient called no answer.LMTC. 

## 2011-08-07 NOTE — Telephone Encounter (Signed)
Please return call to patient at [phone] regarding MRI results.

## 2011-08-08 NOTE — Telephone Encounter (Signed)
I left a message for the patient to call. 

## 2011-08-09 ENCOUNTER — Encounter: Payer: Self-pay | Admitting: *Deleted

## 2011-08-09 ENCOUNTER — Encounter (HOSPITAL_COMMUNITY): Payer: Self-pay | Admitting: Pharmacy Technician

## 2011-08-09 NOTE — Telephone Encounter (Signed)
The patient is scheduled for his ICD implant on 5/13. I have given verbal instructions to the patient's wife. He will come on 5/8 for pre-procedure labs to be done. He will pick up a printed copy of his instructions at that time.

## 2011-08-09 NOTE — Telephone Encounter (Signed)
Patient returning Nurse HM call, he can be reached at [phone]

## 2011-08-09 NOTE — Telephone Encounter (Signed)
I have spoken with the patient. He is aware of Dr. Odessa Fleming recommendations. He is going to call me back and let me know about 08/21/11 and if this is good with his wife's schedule.

## 2011-08-13 ENCOUNTER — Other Ambulatory Visit: Payer: Self-pay | Admitting: Internal Medicine

## 2011-08-13 DIAGNOSIS — I5022 Chronic systolic (congestive) heart failure: Secondary | ICD-10-CM

## 2011-08-16 ENCOUNTER — Other Ambulatory Visit (INDEPENDENT_AMBULATORY_CARE_PROVIDER_SITE_OTHER): Payer: BC Managed Care – PPO

## 2011-08-16 DIAGNOSIS — I2589 Other forms of chronic ischemic heart disease: Secondary | ICD-10-CM

## 2011-08-16 DIAGNOSIS — I255 Ischemic cardiomyopathy: Secondary | ICD-10-CM

## 2011-08-16 LAB — CBC WITH DIFFERENTIAL/PLATELET
Basophils Relative: 0.4 % (ref 0.0–3.0)
Eosinophils Absolute: 0.1 10*3/uL (ref 0.0–0.7)
Hemoglobin: 12.4 g/dL — ABNORMAL LOW (ref 13.0–17.0)
Lymphs Abs: 1.2 10*3/uL (ref 0.7–4.0)
MCHC: 34.7 g/dL (ref 30.0–36.0)
MCV: 97.5 fl (ref 78.0–100.0)
Monocytes Absolute: 0.4 10*3/uL (ref 0.1–1.0)
Neutro Abs: 3.3 10*3/uL (ref 1.4–7.7)
Neutrophils Relative %: 64.9 % (ref 43.0–77.0)
RBC: 3.68 Mil/uL — ABNORMAL LOW (ref 4.22–5.81)

## 2011-08-16 LAB — BASIC METABOLIC PANEL
CO2: 24 mEq/L (ref 19–32)
Chloride: 103 mEq/L (ref 96–112)
Creatinine, Ser: 1.2 mg/dL (ref 0.4–1.5)

## 2011-08-20 MED ORDER — CEFAZOLIN SODIUM-DEXTROSE 2-3 GM-% IV SOLR
2.0000 g | INTRAVENOUS | Status: AC
  Administered 2011-08-21: 2 g via INTRAVENOUS
  Filled 2011-08-20 (×2): qty 50

## 2011-08-20 MED ORDER — SODIUM CHLORIDE 0.9 % IR SOLN
80.0000 mg | Status: AC
  Administered 2011-08-21: 80 mg
  Filled 2011-08-20: qty 2

## 2011-08-21 ENCOUNTER — Encounter (HOSPITAL_COMMUNITY): Payer: Self-pay | Admitting: Internal Medicine

## 2011-08-21 ENCOUNTER — Encounter (HOSPITAL_COMMUNITY)
Admission: RE | Disposition: A | Discharge status: Home / Self Care | Payer: Self-pay | Source: Ambulatory Visit | Attending: Internal Medicine

## 2011-08-21 ENCOUNTER — Ambulatory Visit (HOSPITAL_COMMUNITY)
Admission: RE | Admit: 2011-08-21 | Discharge: 2011-08-22 | Disposition: A | Discharge status: Home / Self Care | Payer: BC Managed Care – PPO | Source: Ambulatory Visit | Attending: Internal Medicine | Admitting: Internal Medicine

## 2011-08-21 DIAGNOSIS — E785 Hyperlipidemia, unspecified: Secondary | ICD-10-CM | POA: Insufficient documentation

## 2011-08-21 DIAGNOSIS — I509 Heart failure, unspecified: Secondary | ICD-10-CM | POA: Insufficient documentation

## 2011-08-21 DIAGNOSIS — I2589 Other forms of chronic ischemic heart disease: Secondary | ICD-10-CM

## 2011-08-21 DIAGNOSIS — I4510 Unspecified right bundle-branch block: Secondary | ICD-10-CM | POA: Insufficient documentation

## 2011-08-21 DIAGNOSIS — G4730 Sleep apnea, unspecified: Secondary | ICD-10-CM | POA: Insufficient documentation

## 2011-08-21 DIAGNOSIS — I4891 Unspecified atrial fibrillation: Secondary | ICD-10-CM | POA: Insufficient documentation

## 2011-08-21 DIAGNOSIS — I255 Ischemic cardiomyopathy: Secondary | ICD-10-CM | POA: Diagnosis present

## 2011-08-21 DIAGNOSIS — E119 Type 2 diabetes mellitus without complications: Secondary | ICD-10-CM | POA: Insufficient documentation

## 2011-08-21 DIAGNOSIS — I5022 Chronic systolic (congestive) heart failure: Secondary | ICD-10-CM | POA: Diagnosis present

## 2011-08-21 HISTORY — DX: Type 2 diabetes mellitus without complications: E11.9

## 2011-08-21 HISTORY — PX: BI-VENTRICULAR IMPLANTABLE CARDIOVERTER DEFIBRILLATOR: SHX5459

## 2011-08-21 HISTORY — DX: Pneumonia, unspecified organism: J18.9

## 2011-08-21 HISTORY — DX: Heart failure, unspecified: I50.9

## 2011-08-21 LAB — SURGICAL PCR SCREEN
MRSA, PCR: NEGATIVE
Staphylococcus aureus: NEGATIVE

## 2011-08-21 LAB — GLUCOSE, CAPILLARY: Glucose-Capillary: 136 mg/dL — ABNORMAL HIGH (ref 70–99)

## 2011-08-21 SURGERY — BI-VENTRICULAR IMPLANTABLE CARDIOVERTER DEFIBRILLATOR  (CRT-D)
Anesthesia: LOCAL

## 2011-08-21 MED ORDER — ASPIRIN EC 81 MG PO TBEC
81.0000 mg | DELAYED_RELEASE_TABLET | Freq: Every day | ORAL | Status: DC
  Administered 2011-08-22: 81 mg via ORAL
  Filled 2011-08-21 (×2): qty 1

## 2011-08-21 MED ORDER — FUROSEMIDE 20 MG PO TABS
10.0000 mg | ORAL_TABLET | Freq: Every day | ORAL | Status: DC
  Administered 2011-08-21 – 2011-08-22 (×2): 10 mg via ORAL
  Filled 2011-08-21 (×2): qty 0.5

## 2011-08-21 MED ORDER — HEPARIN (PORCINE) IN NACL 2-0.9 UNIT/ML-% IJ SOLN
INTRAMUSCULAR | Status: AC
  Filled 2011-08-21: qty 1000

## 2011-08-21 MED ORDER — SPIRONOLACTONE 12.5 MG HALF TABLET
12.5000 mg | ORAL_TABLET | Freq: Every day | ORAL | Status: DC
  Administered 2011-08-21 – 2011-08-22 (×2): 12.5 mg via ORAL
  Filled 2011-08-21 (×2): qty 1

## 2011-08-21 MED ORDER — FENTANYL CITRATE 0.05 MG/ML IJ SOLN
INTRAMUSCULAR | Status: AC
  Filled 2011-08-21: qty 2

## 2011-08-21 MED ORDER — CHLORHEXIDINE GLUCONATE 4 % EX LIQD: 60.0000 mL | Freq: Once | CUTANEOUS | Status: DC

## 2011-08-21 MED ORDER — MIDAZOLAM HCL 5 MG/5ML IJ SOLN
INTRAMUSCULAR | Status: AC
  Filled 2011-08-21: qty 5

## 2011-08-21 MED ORDER — LIDOCAINE HCL (PF) 1 % IJ SOLN
INTRAMUSCULAR | Status: AC
  Filled 2011-08-21: qty 60

## 2011-08-21 MED ORDER — MUPIROCIN 2 % EX OINT
TOPICAL_OINTMENT | Freq: Two times a day (BID) | CUTANEOUS | Status: DC
  Administered 2011-08-21: 08:00:00 via NASAL

## 2011-08-21 MED ORDER — ACETAMINOPHEN 325 MG PO TABS: 325.0000 mg | ORAL_TABLET | ORAL | Status: DC | PRN

## 2011-08-21 MED ORDER — METFORMIN HCL 500 MG PO TABS
500.0000 mg | ORAL_TABLET | Freq: Two times a day (BID) | ORAL | Status: DC
  Administered 2011-08-21 – 2011-08-22 (×2): 500 mg via ORAL
  Filled 2011-08-21 (×4): qty 1

## 2011-08-21 MED ORDER — ALPRAZOLAM 0.25 MG PO TABS
0.2500 mg | ORAL_TABLET | Freq: Every evening | ORAL | Status: DC | PRN
  Administered 2011-08-22: 0.25 mg via ORAL
  Filled 2011-08-21: qty 1

## 2011-08-21 MED ORDER — SODIUM CHLORIDE 0.9 % IJ SOLN: 3.0000 mL | Freq: Two times a day (BID) | INTRAMUSCULAR | Status: DC

## 2011-08-21 MED ORDER — SODIUM CHLORIDE 0.45 % IV SOLN
INTRAVENOUS | Status: DC
  Administered 2011-08-21: 08:00:00 via INTRAVENOUS

## 2011-08-21 MED ORDER — CEFAZOLIN SODIUM 1-5 GM-% IV SOLN
1.0000 g | Freq: Four times a day (QID) | INTRAVENOUS | Status: AC
  Administered 2011-08-21 – 2011-08-22 (×3): 1 g via INTRAVENOUS
  Filled 2011-08-21 (×3): qty 50

## 2011-08-21 MED ORDER — LISINOPRIL 10 MG PO TABS
10.0000 mg | ORAL_TABLET | Freq: Two times a day (BID) | ORAL | Status: DC
  Administered 2011-08-21 – 2011-08-22 (×3): 10 mg via ORAL
  Filled 2011-08-21 (×4): qty 1

## 2011-08-21 MED ORDER — CARVEDILOL 12.5 MG PO TABS
12.5000 mg | ORAL_TABLET | Freq: Two times a day (BID) | ORAL | Status: DC
  Administered 2011-08-21 – 2011-08-22 (×2): 12.5 mg via ORAL
  Filled 2011-08-21 (×4): qty 1

## 2011-08-21 MED ORDER — SODIUM CHLORIDE 0.9 % IV SOLN: 250.0000 mL | INTRAVENOUS | Status: DC

## 2011-08-21 MED ORDER — ONDANSETRON HCL 4 MG/2ML IJ SOLN: 4.0000 mg | Freq: Four times a day (QID) | INTRAMUSCULAR | Status: DC | PRN

## 2011-08-21 MED ORDER — SODIUM CHLORIDE 0.9 % IJ SOLN: 3.0000 mL | INTRAMUSCULAR | Status: DC | PRN

## 2011-08-21 MED ORDER — SODIUM CHLORIDE 0.9 % IV SOLN: INTRAVENOUS | Status: AC

## 2011-08-21 MED ORDER — MUPIROCIN 2 % EX OINT
TOPICAL_OINTMENT | CUTANEOUS | Status: AC
  Filled 2011-08-21: qty 22

## 2011-08-21 NOTE — H&P (Signed)
History and Physical  Patient ID: [name] MRN: [id number], SOB: 1955/09/05 56 y.o. Date of Encounter: 08/21/2011, 7:34 AM  Primary Physician: Minda Meo, MD, MD Primary Cardiologist: PJ Primary Electrophysiologist:  Sk  Chief Complaint: for icd IMPLANTATION   History of Present Illness: [name] is a 56 y.o. male with prior MI, CABG and persistent depression of LV systolic function desptie guideline directed medical therapy.  MRI recently showed EF 31%  He has RBBB with QRS > 150 and class 2-3 CHF   He continues to struggle with lightheadedness particularly in the am following his diuretic  Past Medical History  Diagnosis Date  . MI, old 2006    anterior MI in 2006  . Ischemic cardiomyopathy     EF 36%; prior anterior MI, s/p CABG x 3; s/p cath Feb 2013 showing grafts to be patent with severe LV dysfunction  MRI 07/2011 >>EF 31%:reportedly 20 to 25% PER CATH FEB 2013; repeat echo reviewed by Dr. Swaziland and Shirlee Latch in 06/2011 with EF 30 to 35%  . Chronic systolic heart failure     Class 2-3;   . Hyperlipidemia   . Hemochromatosis   . RBBB (right bundle branch block)   . Rotator cuff injury   . Diabetes mellitus   . Sleep apnea   . PAF (paroxysmal atrial fibrillation)   . Anemia      Past Surgical History  Procedure Date  . Cardiac catheterization 03/12/2005    INFERIOR WALL AKINESIA. THERE IS GLOBAL HYPOKINESIA WITH OVERALL MODERATE TO SEVERE LEFT VENTRICULAR SYSTOLIC DYSFUNCTION. EF 35%  . Coronary artery bypass graft 2006    Emergent CABG x 3 with LIMA to LAD, SVG to OM, SVG to distal RCA per Dr. Tyrone Sage  . Shoulder surgery   . Rotator cuff repair   . Cardiac catheterization Feb 2013    Grafts patent. EF is 20 to 25%.      Current Facility-Administered Medications  Medication Dose Route Frequency Provider Last Rate Last Dose  . 0.45 % sodium chloride infusion   Intravenous Continuous Duke Salvia, MD      . 0.9 %  sodium chloride  infusion  250 mL Intravenous Continuous Duke Salvia, MD      . ceFAZolin (ANCEF) IVPB 2 g/50 mL premix  2 g Intravenous On Call Duke Salvia, MD      . chlorhexidine (HIBICLENS) 4 % liquid 4 application  60 mL Topical Once Duke Salvia, MD      . gentamicin (GARAMYCIN) 80 mg in sodium chloride irrigation 0.9 % 500 mL irrigation  80 mg Irrigation On Call Duke Salvia, MD      . sodium chloride 0.9 % injection 3 mL  3 mL Intravenous Q12H Duke Salvia, MD      . sodium chloride 0.9 % injection 3 mL  3 mL Intravenous PRN Duke Salvia, MD       No current facility-administered medications on file prior to encounter.   Current Outpatient Prescriptions on File Prior to Encounter  Medication Sig Dispense Refill  . ALPRAZolam (XANAX) 0.25 MG tablet Take 0.25 mg by mouth at bedtime as needed. For anxiety      . aspirin EC 81 MG tablet Take 81 mg by mouth daily.      . carvedilol (COREG) 25 MG tablet Take 12.5 mg by mouth 2 (two) times daily.      . furosemide (LASIX) 40 MG tablet Take  1 tablet by mouth Daily.      Marland Kitchen lisinopril (PRINIVIL,ZESTRIL) 10 MG tablet Take 10 mg by mouth 2 (two) times daily.      . metFORMIN (GLUCOPHAGE) 500 MG tablet Take 500 mg by mouth 2 (two) times daily with a meal.        . nitroGLYCERIN (NITROSTAT) 0.4 MG SL tablet Place 0.4 mg under the tongue every 5 (five) minutes as needed. For chest pain      . spironolactone (ALDACTONE) 25 MG tablet Take 12.5 mg by mouth daily.          Allergies: No Known Allergies   History  Substance Use Topics  . Smoking status: Former Smoker -- 2.0 packs/day for 30 years    Types: Cigarettes    Quit date: 03/11/2005  . Smokeless tobacco: Never Used  . Alcohol Use: No      Family History  Problem Relation Age of Onset  . Adopted: Yes      ROS:  Please see the history of present illness.      All other systems reviewed and negative.   Vital Signs: 2 PHYSICAL EXAM: General:  Well nourished, well developed male  in no acute distress  HEENT: normal Lymph: no adenopathy Neck: no JVD Endocrine:  No thryomegaly Vascular: No carotid bruits; FA pulses 2+ bilaterally without bruits Cardiac:  normal S1, S2; RRR; no murmur Back: without kyphosis/scoliosis, no CVA tenderness Lungs:  clear to auscultation bilaterally, no wheezing, rhonchi or rales Abd: soft, nontender, no hepatomegaly Ext: no edema Musculoskeletal:  No deformities, BUE and BLE strength normal and equal Skin: warm and dry Neuro:  CNs 2-12 intact, no focal abnormalities noted Psych:  Normal affect    Labs:   Lab Results  Component Value Date   WBC 5.1 08/16/2011   HGB 12.4* 08/16/2011   HCT 35.9* 08/16/2011   MCV 97.5 08/16/2011   PLT 226.0 08/16/2011    Lab 08/16/11 0912  NA 138  K 4.6  CL 103  CO2 24  BUN 24*  CREATININE 1.2  CALCIUM 9.6  PROT --  BILITOT --  ALKPHOS --  ALT --  AST --  GLUCOSE 147*   No results found for this basename: CKTOTAL:4,CKMB:4,TROPONINI:4 in the last 72 hours Lab Results  Component Value Date   CHOL 204* 05/23/2011   HDL 45.70 05/23/2011   LDLCALC 116* 01/13/2011   TRIG 237.0* 05/23/2011   No results found for this basename: DDIMER   BNP Pro B Natriuretic peptide (BNP)  Date/Time Value Range Status  05/23/2011 10:11 AM 62.0  0.0-100.0 (pg/mL) Final       ASSESSMENT AND PLAN:       Patient Active Hospital Problem List: Ischemic cardiomyopathy ()   RBBB (right bundle branch block) ()   Chronic systolic heart failure ()   For CRT implant today.   Have reviewed the potential benefits and risks of ICD implantation including but not limited to death, perforation of heart or lung, lead dislodgement, infection,  device malfunction and inappropriate shocks.  The  Family express understanding  and are willing to proceed.   Will anticipate post discharge referral to CHF clinic and adjustment of diuretics prior to discharge given history of LH and am hypotension

## 2011-08-21 NOTE — CV Procedure (Signed)
See dictated note  preop Dx ICM  CHF  RBBB>150 Post op dx same CRT-D implant [phone]

## 2011-08-22 ENCOUNTER — Ambulatory Visit (HOSPITAL_COMMUNITY): Payer: BC Managed Care – PPO

## 2011-08-22 DIAGNOSIS — I2589 Other forms of chronic ischemic heart disease: Secondary | ICD-10-CM

## 2011-08-22 MED ORDER — FUROSEMIDE 20 MG PO TABS: 10.0000 mg | ORAL_TABLET | Freq: Every day | ORAL | Status: DC

## 2011-08-22 NOTE — Op Note (Signed)
NAMEVADA, YELLEN              ACCOUNT NO.:  [id number]  MEDICAL RECORD NO.:  [id number]  LOCATION:  [facility]                         FACILITY:  [facility]  PHYSICIAN:  Duke Salvia, MD, FACCDATE OF BIRTH:  12/01/1955  DATE OF PROCEDURE:  08/21/2011 DATE OF DISCHARGE:                              OPERATIVE REPORT   PREOPERATIVE DIAGNOSES:  Ischemic cardiomyopathy, congestive heart failure, and right bundle-branch block with a QRS duration of 152 msec.  POSTOPERATIVE DIAGNOSES:  Ischemic cardiomyopathy, congestive heart failure, and right bundle-branch block with a QRS duration of 152 msec.  PROCEDURE:  Dual-chamber defibrillator implantation with left ventricular lead placement and intraoperative defibrillation threshold testing.  Following obtained informed consent, the patient was brought to the electrophysiology laboratory and placed on the fluoroscopic table in supine position.  After routine prep and drape of left upper chest, lidocaine was infiltrated in prepectoral subclavicular region.  An incision was made and carried down to layer of the prepectoral fascia using electrocautery and sharp dissection.  A pocket was formed similarly.  Hemostasis was obtained.  Thereafter, attention was turned to gain access to the extrathoracic left subclavian vein, which was accomplished without difficulty, without the aspiration or puncture of the artery.  Three separate venipunctures were accomplished.  Guidewires were placed and retained.  Sequentially, a 9-French, 9.5 [place], and 7-French sheaths were placed, which were passed sequentially a Guidant serial number [id number], single coil active fixation defibrillator, a Medtronic MB2 coronary sinus cannulation catheter, and a Medtronic 5076, 52 cm active fixation atrial lead.  Under fluoroscopic guidance, the right ventricular lead was manipulated to the distal right ventricular apex where the bipolar R- wave was 7.7 mV with a pace  impedance of 697 ohms with a threshold of 1 V at 0.5 msec.  Current of injury was brisk.  There was no diaphragmatic pacing at 10 V.  This lead was secured to the prepectoral fascia.  The coronary sinus was cannulated with mild difficulty.  A venogram was accomplished, it demonstrated a single lateral branch, which was relatively short and it was of small diameter.  A 4396 Medtronic lead, serial number [id number] was utilized.  Unfortunately in its distal ramification, where it was most secure, there was diaphragmatic pacing in all 3 configurations. More proximally, there was no diaphragmatic stimulation with rent proximal coil pacing.  We then reposition the lead into a different terminal branch and in this location, there was no diaphragmatic pacing at 10 V.  The threshold was 2.4 V at 0.5 msec, impedance of 1377 ohms, and the LV amplitude was 5.1.  The right atrial lead was then deployed and this was a serial number of [id number].  Through the device, the amplitude was 4.4, the pace impedance of 780, threshold 1 V at 0.5 msec with a brisk current of injury, and current of threshold was 1.3 mA.  These leads were then secured to the prepectoral fascia.  The deployment system was removed.  The leads were also secured to the prepectoral fascia and then attached to Medtronic [id number] device, serial number [id number].  Through the device, bipolar P-wave was 1.1 with a pace impedance of 551, threshold of 1 V at 0.4.  The  R-wave was 9 with a pace impedance of 475, a threshold 1 V at 0.4, and the LV impedance was 551 with threshold of 4.25 at 0.86.  As there were no other options for pacing configurations and no other veins, it was elected to leave the lead as it was.  We considered removing the lead and putting in the [facility]. Jude Quadripolar lead; however, I did not think that the vein length was probably long enough to house the other lead.  The pocket was copiously irrigated with  antibiotic-containing saline solution.  Hemostasis was assured.  Leads and pulse generator were placed in the pocket and secured to the prepectoral fascia.  The wound was then closed in 3 layers in normal fashion.  The wound was washed, dried, and a Benzoin and Steri-Strip dressing was applied.  Needle counts, sponge counts, and instrument counts were correct at the end of the procedure according to the staff.  The patient tolerated the procedure without apparent complications.  At that point, I degloved and the patient was sedated for intraoperative defibrillation threshold testing.  Fibrillation was then induced via T- wave shock after total duration of 7 seconds and a 15-joule shock was delivered through measured resistance of 72 ohms, terminating ventricular fibrillation and restoring __________ paced rhythm.     Duke Salvia, MD, [facility]     SCK/MEDQ  D:  08/21/2011  T:  08/21/2011  Job:  [id number]

## 2011-08-22 NOTE — Discharge Instructions (Signed)
   Supplemental Discharge Instructions for  Pacemaker/Defibrillator Patients  Activity No heavy lifting or vigorous activity with your left/right arm for 6 to 8 weeks.  Do not raise your left/right arm above your head for one week.  Gradually raise your affected arm as drawn below.           05/16                       05/17                       05/18                     05/19       NO DRIVING for 1 week; you may begin driving on 16/01/9603. WOUND CARE   Keep the wound area clean and dry.  Do not get this area wet for one week. No showers for one week; you may shower on 08/29/2011.   The tape/steri-strips on your wound will fall off; do not pull them off.  No bandage is needed on the site.  DO  NOT apply any creams, oils, or ointments to the wound area.   If you notice any drainage or discharge from the wound, any swelling or bruising at the site, or you develop a fever > 101? F after you are discharged home, call the office at once.  Special Instructions   You are still able to use cellular telephones; use the ear opposite the side where you have your pacemaker/defibrillator.  Avoid carrying your cellular phone near your device.   When traveling through airports, show security personnel your identification card to avoid being screened in the metal detectors.  Ask the security personnel to use the hand wand.   Avoid arc welding equipment, MRI testing (magnetic resonance imaging), TENS units (transcutaneous nerve stimulators).  Call the office for questions about other devices.   Avoid electrical appliances that are in poor condition or are not properly grounded.   Microwave ovens are safe to be near or to operate.  Additional information for defibrillator patients should your device go off:   If your device goes off ONCE and you feel fine afterward, notify the device clinic nurses.   If your device goes off ONCE and you do not feel well afterward, call 911.   If your device goes off  TWICE, call 911.   If your device goes off THREE times in one day, call 911.  DO NOT DRIVE YOURSELF OR A FAMILY MEMBER WITH A DEFIBRILLATOR TO THE HOSPITAL--CALL 911.

## 2011-08-22 NOTE — Discharge Summary (Signed)
ELECTROPHYSIOLOGY PROCEDURE DISCHARGE SUMMARY    Patient ID: [name],  MRN: [id number], DOB/AGE: 1956-01-11 56 y.o.  Admit date: 08/21/2011 Discharge date: 08/22/2011  Primary Care Physician: Geoffry Paradise, MD Primary Cardiologist: Peter Swaziland, MD Electrophysiologist: Sherryl Manges, MD  Primary Discharge Diagnosis:  Ischemic cardiomyopathy, RBBB status post CRTD implantation this admission  Secondary Discharge Diagnosis:  1.  Coronary artery disease 2.  Hyperlipidemia 3.  Chronic systolic heart failure- class II-III 4.  Diabetes 5.  Sleep apnea 6.  PAF 7.  Anemia  Procedures This Admission:  1.  Implantation of a cardiac resynchronization defibrillator on 08-21-2011 by Dr Graciela Husbands.  The patient received a Medtronic [id number] CRTD with model number 5076 right atrial lead, 0181 right ventricular lead, and 4396 left ventricular lead.  DFT's at time of implant were successful at 15J.  The patient had no early apparent complications.  2.  Chest x-ray on 08-22-2011 demonstrated no pneumothorax status post device implantation.   Brief HPI: [name] is a 56 y.o. male : Seen at the request of Dr. Swaziland andLori Gerhardt for consideration of ICD implantation for primary prevention.  He has a history of ischemic cardiomyopathy having suffered an MI 2006 in undergoing emergent CABG during that hospitalization. with ejection fraction 20-25% by catheterization Echocardiogram March 11 demonstrated ejection fraction of 45-50%  He has marked limitations in exercise tolerance. He is short of breath it lasted about 100 yards and he cannot climb a flight of stairs.  He has not had syncope. He denies palpitations.  He underwent catheterization in early February 2013--LVEDP was 24; patent vein graft with small targets. Cardiac MRI was obtained which demonstrated an EF of 31%.  Risks, benefits, and alternatives to CRTD implantation were reviewed with the patient who wished to proceed.    Hospital Course:  The patient was admitted and underwent implantation of a CRTD with details as outlined above.   He was monitored on telemetry overnight which demonstrated sinus rhythm with ventricular pacing.  Left chest was without hematoma or ecchymosis.  The device was interrogated and found to be functioning normally.  CXR was obtained and demonstrated no pneumothorax status post device implantation.  Wound care, arm mobility, and restrictions were reviewed with the patient.  Dr Graciela Husbands examined the patient and considered them stable for discharge to home. Because of the patient's dizziness after morning medications, his Lasix was reduced from 40mg  to 10mg  daily at discharge per Dr Graciela Husbands.  He will have a BMET drawn at wound check to evaluate Potassium and renal function.    Discharge Vitals: Blood pressure 105/69, pulse 73, temperature 98.1 F (36.7 C), temperature source Oral, resp. rate 18, height 6' (1.829 m), weight 217 lb 13 oz (98.8 kg), SpO2 96.00%.    Labs:   Lab Results  Component Value Date   WBC 5.1 08/16/2011   HGB 12.4* 08/16/2011   HCT 35.9* 08/16/2011   MCV 97.5 08/16/2011   PLT 226.0 08/16/2011    Lab 08/16/11 0912  NA 138  K 4.6  CL 103  CO2 24  BUN 24*  CREATININE 1.2  CALCIUM 9.6  PROT --  BILITOT --  ALKPHOS --  ALT --  AST --  GLUCOSE 147*    Discharge Medications:  Medication List  As of 08/22/2011  9:23 AM   TAKE these medications         ALPRAZolam 0.25 MG tablet   Commonly known as: XANAX   Take 0.25 mg by mouth  at bedtime as needed. For anxiety      aspirin EC 81 MG tablet   Take 81 mg by mouth daily.      carvedilol 25 MG tablet   Commonly known as: COREG   Take 12.5 mg by mouth 2 (two) times daily.      furosemide 40 MG tablet   Commonly known as: LASIX   Take 1 tablet by mouth Daily.      lisinopril 10 MG tablet   Commonly known as: PRINIVIL,ZESTRIL   Take 10 mg by mouth 2 (two) times daily.      metFORMIN 500 MG tablet   Commonly  known as: GLUCOPHAGE   Take 500 mg by mouth 2 (two) times daily with a meal.      nitroGLYCERIN 0.4 MG SL tablet   Commonly known as: NITROSTAT   Place 0.4 mg under the tongue every 5 (five) minutes as needed. For chest pain      spironolactone 25 MG tablet   Commonly known as: ALDACTONE   Take 12.5 mg by mouth daily.            Disposition:  Discharge Orders    Future Appointments: Provider: Department: Dept Phone: Center:   08/31/2011 2:00 PM [facility] [phone] [facility]   08/31/2011 2:30 PM [facility] [phone] [facility]   09/27/2011 9:00 AM Peter M Swaziland, MD [facility] [phone] None     Follow-up Information    Follow up with [facility] on 08/31/2011. (At 2:00 PM for wound check)    Contact information:   [address] [place] [address]          Duration of Discharge Encounter: Greater than 30 minutes including physician time.  Signed, Gypsy Balsam, RN, BSN 08/22/2011, 9:23 AM

## 2011-08-22 NOTE — Progress Notes (Signed)
   ELECTROPHYSIOLOGY ROUNDING NOTE    Patient Name: [name] Date of Encounter: 08-22-2011    SUBJECTIVE:Patient feels well s/p CRTD implantation.  No chest pain or shortness of breath.  Reports improved energy level this morning.  TELEMETRY: Reviewed telemetry pt in sinus rhythm with V pacing Filed Vitals:   08/21/11 1600 08/21/11 1900 08/21/11 2300 08/22/11 0426  BP: 108/66 107/69 100/62 109/68  Pulse: 83 81 83 79  Temp: 97.8 F (36.6 C) 98.7 F (37.1 C) 98.2 F (36.8 C) 98.3 F (36.8 C)  TempSrc: Oral Oral Oral Oral  Resp: 21 17    Height:      Weight:    217 lb 13 oz (98.8 kg)  SpO2: 97% 96% 96% 96%  PHYSICAL EXAM Left chest without hematoma/ecchymosis Well developed and nourished in no acute distress HENT normal Neck supple with JVP-flat Clear Regular rate and rhythm, no murmurs or gallops Abd-soft with active BS No Clubbing cyanosis edema Skin-warm and dry A & Oriented  Grossly normal sensory and motor function     Intake/Output Summary (Last 24 hours) at 08/22/11 0737 Last data filed at 08/22/11 0429  Gross per 24 hour  Intake 905.83 ml  Output      0 ml  Net 905.83 ml   Radiology/Studies:  Final result pending, leads in stable position.   DEVICE INTERROGATION: Device interrogated by industry.  Lead values including impedence, sensing, threshold within normal values.    Active Problems:  Ischemic cardiomyopathy  RBBB (right bundle branch block)  Chronic systolic heart failure   Wound care, arm mobility, restrictions reviewed with patient.  Follow up scheduled in device clinic in 10 days (with BMET), Dr Swaziland in 4 weeks, and Dr Graciela Husbands in 8 weeks.   Continue current meds

## 2011-08-24 ENCOUNTER — Encounter: Payer: Self-pay | Admitting: *Deleted

## 2011-08-24 DIAGNOSIS — Z4502 Encounter for adjustment and management of automatic implantable cardiac defibrillator: Secondary | ICD-10-CM | POA: Insufficient documentation

## 2011-08-28 NOTE — Discharge Summary (Signed)
Asabove  S/p CRTD

## 2011-08-31 ENCOUNTER — Other Ambulatory Visit (INDEPENDENT_AMBULATORY_CARE_PROVIDER_SITE_OTHER): Payer: BC Managed Care – PPO

## 2011-08-31 ENCOUNTER — Encounter: Payer: Self-pay | Admitting: Internal Medicine

## 2011-08-31 ENCOUNTER — Ambulatory Visit (INDEPENDENT_AMBULATORY_CARE_PROVIDER_SITE_OTHER): Payer: BC Managed Care – PPO | Admitting: *Deleted

## 2011-08-31 DIAGNOSIS — I255 Ischemic cardiomyopathy: Secondary | ICD-10-CM

## 2011-08-31 DIAGNOSIS — I428 Other cardiomyopathies: Secondary | ICD-10-CM

## 2011-08-31 DIAGNOSIS — I2589 Other forms of chronic ischemic heart disease: Secondary | ICD-10-CM

## 2011-08-31 DIAGNOSIS — E785 Hyperlipidemia, unspecified: Secondary | ICD-10-CM

## 2011-08-31 LAB — ICD DEVICE OBSERVATION
ATRIAL PACING ICD: 0.06 pct
BAMS-0001: 170 {beats}/min
FVT: 0
LV LEAD IMPEDENCE ICD: 589 Ohm
LV LEAD THRESHOLD: 3.75 V
RV LEAD THRESHOLD: 0.625 V
TZAT-0001ATACH: 1
TZAT-0001ATACH: 2
TZAT-0002ATACH: NEGATIVE
TZAT-0002ATACH: NEGATIVE
TZAT-0004SLOWVT: 8
TZAT-0005SLOWVT: 88 pct
TZAT-0005SLOWVT: 91 pct
TZAT-0011SLOWVT: 10 ms
TZAT-0011SLOWVT: 10 ms
TZAT-0018ATACH: NEGATIVE
TZAT-0018ATACH: NEGATIVE
TZAT-0018SLOWVT: NEGATIVE
TZAT-0018SLOWVT: NEGATIVE
TZAT-0019ATACH: 6 V
TZAT-0019ATACH: 6 V
TZAT-0019FASTVT: 8 V
TZAT-0020ATACH: 1.5 ms
TZAT-0020FASTVT: 1.5 ms
TZON-0003ATACH: 350 ms
TZON-0004SLOWVT: 16
TZON-0004VSLOWVT: 28
TZON-0005SLOWVT: 12
TZST-0001ATACH: 5
TZST-0001FASTVT: 3
TZST-0001SLOWVT: 4
TZST-0001SLOWVT: 6
TZST-0002ATACH: NEGATIVE
TZST-0002ATACH: NEGATIVE
TZST-0002FASTVT: NEGATIVE
TZST-0002FASTVT: NEGATIVE
TZST-0003SLOWVT: 25 J
TZST-0003SLOWVT: 35 J
VENTRICULAR PACING ICD: 99.84 pct
VF: 0

## 2011-08-31 LAB — HEPATIC FUNCTION PANEL
Albumin: 3.9 g/dL (ref 3.5–5.2)
Alkaline Phosphatase: 56 U/L (ref 39–117)
Bilirubin, Direct: 0.1 mg/dL (ref 0.0–0.3)
Total Protein: 6.7 g/dL (ref 6.0–8.3)

## 2011-08-31 LAB — LIPID PANEL
Cholesterol: 222 mg/dL — ABNORMAL HIGH (ref 0–200)
HDL: 31.8 mg/dL — ABNORMAL LOW (ref >39.00)
Total CHOL/HDL Ratio: 7
VLDL: 82 mg/dL — ABNORMAL HIGH (ref 0.0–40.0)

## 2011-08-31 NOTE — Progress Notes (Signed)
Wound check defib in clinic  

## 2011-09-08 ENCOUNTER — Other Ambulatory Visit: Payer: Self-pay | Admitting: Cardiology

## 2011-09-08 MED ORDER — CARVEDILOL 25 MG PO TABS: 12.5000 mg | ORAL_TABLET | Freq: Two times a day (BID) | ORAL | Status: DC

## 2011-09-11 ENCOUNTER — Other Ambulatory Visit: Payer: Self-pay

## 2011-09-11 DIAGNOSIS — E785 Hyperlipidemia, unspecified: Secondary | ICD-10-CM

## 2011-09-11 MED ORDER — FENOFIBRATE 145 MG PO TABS: 145.0000 mg | ORAL_TABLET | Freq: Every day | ORAL | Status: DC

## 2011-09-27 ENCOUNTER — Encounter: Payer: Self-pay | Admitting: Cardiology

## 2011-09-27 ENCOUNTER — Ambulatory Visit (INDEPENDENT_AMBULATORY_CARE_PROVIDER_SITE_OTHER): Payer: BC Managed Care – PPO | Admitting: Cardiology

## 2011-09-27 VITALS — BP 110/58 | HR 62 | Ht 72.0 in | Wt 212.0 lb

## 2011-09-27 DIAGNOSIS — I509 Heart failure, unspecified: Secondary | ICD-10-CM

## 2011-09-27 DIAGNOSIS — E785 Hyperlipidemia, unspecified: Secondary | ICD-10-CM

## 2011-09-27 DIAGNOSIS — I5022 Chronic systolic (congestive) heart failure: Secondary | ICD-10-CM

## 2011-09-27 DIAGNOSIS — I2510 Atherosclerotic heart disease of native coronary artery without angina pectoris: Secondary | ICD-10-CM

## 2011-09-27 NOTE — Assessment & Plan Note (Signed)
He has noted a significant improvement in his symptoms with CRT therapy. He has improved at least one classification. His Lasix dose was reduced. We will continue with his current therapy including carvedilol, ACE inhibitor, and Aldactone. If he continues to do well we may be able to stop his Lasix altogether. He has a followup with Dr. Graciela Husbands in one month and I will see him back in 3 months for followup.

## 2011-09-27 NOTE — Assessment & Plan Note (Signed)
He has no significant anginal symptoms. We will continue with aspirin, beta blocker, and statin therapy.

## 2011-09-27 NOTE — Progress Notes (Signed)
[name] Date of Birth: [birth year] Medical Record #[id number]  History of Present Illness: [name] is seen back today for a followup visit. He underwent placement of the ICD/CRT on 08/21/2011. Since then he states he feels like a different person. His energy level is markedly improved. His breathing is much better. He is able to walk further. He denies any orthopnea and is sleeping better. He still runs down easily with exertion. He still notes lightheadedness if he bends over and still has occasional low blood pressure readings. His Lasix dose was reduced to 10 mg daily a month ago.  Current Outpatient Prescriptions on File Prior to Visit  Medication Sig Dispense Refill  . ALPRAZolam (XANAX) 0.25 MG tablet Take 0.25 mg by mouth at bedtime as needed. For anxiety      . aspirin EC 81 MG tablet Take 81 mg by mouth daily.      . carvedilol (COREG) 25 MG tablet Take 0.5 tablets (12.5 mg total) by mouth 2 (two) times daily.  30 tablet  11  . fenofibrate (TRICOR) 145 MG tablet Take 1 tablet (145 mg total) by mouth daily.  30 tablet  11  . furosemide (LASIX) 20 MG tablet Take 0.5 tablets (10 mg total) by mouth daily.  30 tablet  6  . lisinopril (PRINIVIL,ZESTRIL) 10 MG tablet Take 10 mg by mouth 2 (two) times daily.      . metFORMIN (GLUCOPHAGE) 500 MG tablet Take 500 mg by mouth 2 (two) times daily with a meal.        . nitroGLYCERIN (NITROSTAT) 0.4 MG SL tablet Place 0.4 mg under the tongue every 5 (five) minutes as needed. For chest pain      . spironolactone (ALDACTONE) 25 MG tablet Take 12.5 mg by mouth daily.         No Known Allergies  Past Medical History  Diagnosis Date  . Ischemic cardiomyopathy     EF 36%; prior anterior MI, s/p CABG x 3; s/p cath Feb 2013 showing grafts to be patent with severe LV dysfunction  . Chronic systolic heart failure     class 2-3  . Hyperlipidemia   . Hemochromatosis   . RBBB (right bundle branch block)   . Rotator cuff injury   . Sleep apnea     . PAF (paroxysmal atrial fibrillation)   . Anemia   . AMI anterior wall 2006  . CHF (congestive heart failure)   . Angina     "upon exertion"  . Pneumonia ~ 2001    "chemical; from smelling chlorox"  . Exertional dyspnea   . Type II diabetes mellitus   . ICD (implantable cardiac defibrillator) in place   . Pacemaker     Past Surgical History  Procedure Date  . Cardiac catheterization 03/12/2005    INFERIOR WALL AKINESIA. THERE IS GLOBAL HYPOKINESIA WITH OVERALL MODERATE TO SEVERE LEFT VENTRICULAR SYSTOLIC DYSFUNCTION. EF 35%  . Coronary artery bypass graft 2006    Emergent CABG x 3 with LIMA to LAD, SVG to OM, SVG to distal RCA per Dr. Tyrone Sage  . Rotator cuff repair ?2000/~ 2007    left; right   . Cardiac catheterization Feb 2013    Grafts patent. EF is 20 to 25%.  . Cardiac defibrillator placement 08/21/11    "pacer/defib"  . Insert / replace / remove pacemaker     History  Smoking status  . Former Smoker -- 2.0 packs/day for 30 years  . Types: Cigarettes  .  Quit date: 03/11/2005  Smokeless tobacco  . Never Used    History  Alcohol Use  . 1.8 oz/week  . 3 Cans of beer per week    Family History  Problem Relation Age of Onset  . Adopted: Yes    Review of Systems: The review of systems is per the HPI.  All other systems were reviewed and are negative.  Physical Exam: BP 110/58  Pulse 62  Ht 6' (1.829 m)  Wt 212 lb (96.163 kg)  BMI 28.75 kg/m2 Patient is very pleasant and in no acute distress. Skin is warm and dry. Color is normal.  HEENT is unremarkable. Normocephalic/atraumatic. PERRL. Sclera are nonicteric. Neck is supple. No masses. No JVD. His ICD site has healed well. Lungs are clear. Cardiac exam shows a regular rate and rhythm. Abdomen is soft. Extremities are without edema. Gait and ROM are intact. No gross neurologic deficits noted.    LABORATORY DATA:   Assessment / Plan:

## 2011-09-27 NOTE — Patient Instructions (Signed)
Continue your current therapy  I will see you again in 3 months    

## 2011-12-06 ENCOUNTER — Encounter: Payer: Self-pay | Admitting: Internal Medicine

## 2011-12-06 ENCOUNTER — Ambulatory Visit (INDEPENDENT_AMBULATORY_CARE_PROVIDER_SITE_OTHER): Payer: BC Managed Care – PPO | Admitting: Internal Medicine

## 2011-12-06 VITALS — BP 121/78 | HR 70 | Ht 72.0 in | Wt 213.4 lb

## 2011-12-06 DIAGNOSIS — Z95810 Presence of automatic (implantable) cardiac defibrillator: Secondary | ICD-10-CM

## 2011-12-06 DIAGNOSIS — I5022 Chronic systolic (congestive) heart failure: Secondary | ICD-10-CM

## 2011-12-06 DIAGNOSIS — I4891 Unspecified atrial fibrillation: Secondary | ICD-10-CM

## 2011-12-06 DIAGNOSIS — I2589 Other forms of chronic ischemic heart disease: Secondary | ICD-10-CM

## 2011-12-06 DIAGNOSIS — I480 Paroxysmal atrial fibrillation: Secondary | ICD-10-CM

## 2011-12-06 DIAGNOSIS — I255 Ischemic cardiomyopathy: Secondary | ICD-10-CM

## 2011-12-06 LAB — ICD DEVICE OBSERVATION
AL AMPLITUDE: 2.1 mv
AL IMPEDENCE ICD: 456 Ohm
AL THRESHOLD: 0.5 V
BATTERY VOLTAGE: 3.1355 V
HV IMPEDENCE: 64 Ohm
LV LEAD IMPEDENCE ICD: 646 Ohm
RV LEAD AMPLITUDE: 15.6 mv
RV LEAD IMPEDENCE ICD: 551 Ohm
TOT-0001: 1
TOT-0002: 0
TOT-0006: 20130513000000
TZAT-0001ATACH: 2
TZAT-0001ATACH: 3
TZAT-0001FASTVT: 1
TZAT-0001SLOWVT: 1
TZAT-0001SLOWVT: 2
TZAT-0002ATACH: NEGATIVE
TZAT-0002FASTVT: NEGATIVE
TZAT-0004SLOWVT: 8
TZAT-0004SLOWVT: 8
TZAT-0012ATACH: 150 ms
TZAT-0012ATACH: 150 ms
TZAT-0012ATACH: 150 ms
TZAT-0012FASTVT: 170 ms
TZAT-0012SLOWVT: 170 ms
TZAT-0012SLOWVT: 170 ms
TZAT-0013SLOWVT: 2
TZAT-0013SLOWVT: 2
TZAT-0018ATACH: NEGATIVE
TZAT-0018FASTVT: NEGATIVE
TZAT-0019SLOWVT: 8 V
TZAT-0019SLOWVT: 8 V
TZAT-0020ATACH: 1.5 ms
TZAT-0020ATACH: 1.5 ms
TZAT-0020SLOWVT: 1.5 ms
TZAT-0020SLOWVT: 1.5 ms
TZON-0003ATACH: 350 ms
TZON-0003VSLOWVT: 400 ms
TZON-0004SLOWVT: 16
TZON-0004VSLOWVT: 28
TZST-0001ATACH: 4
TZST-0001ATACH: 6
TZST-0001FASTVT: 4
TZST-0001FASTVT: 6
TZST-0001SLOWVT: 3
TZST-0001SLOWVT: 5
TZST-0002ATACH: NEGATIVE
TZST-0002ATACH: NEGATIVE
TZST-0002ATACH: NEGATIVE
TZST-0002FASTVT: NEGATIVE
TZST-0002FASTVT: NEGATIVE
TZST-0002FASTVT: NEGATIVE
TZST-0003SLOWVT: 35 J
TZST-0003SLOWVT: 35 J

## 2011-12-06 MED ORDER — CARVEDILOL 25 MG PO TABS: ORAL_TABLET | ORAL | Status: DC

## 2011-12-06 NOTE — Assessment & Plan Note (Signed)
The patient's device was interrogated and the information was fully reviewed.  The device was reprogrammed to maximize longevity  

## 2011-12-06 NOTE — Patient Instructions (Addendum)
Remote monitoring is used to monitor your Pacemaker of ICD from home. This monitoring reduces the number of office visits required to check your device to one time per year. It allows [place] to keep an eye on the functioning of your device to ensure it is working properly. You are scheduled for a device check from home on 03/11/12. You may send your transmission at any time that day. If you have a wireless device, the transmission will be sent automatically. After your physician reviews your transmission, you will receive a postcard with your next transmission date.  Your physician wants you to follow-up in: may 2014 with Dr Graciela Husbands, You will receive a reminder letter in the mail two months in advance. If you don't receive a letter, please call our office to schedule the follow-up appointment.  Your physician has recommended you make the following change in your medication:   Increase coreg to 12.5 mg in morning and 25 mg in evening- 12 hours apart

## 2011-12-06 NOTE — Assessment & Plan Note (Signed)
We will increase his carvedilol to 12 and half twice a day<<12.5/25. Apparently had been decreased previously because of hypotension. He sees Dr. Swaziland in followup, further up titration can be undertaken if his blood pressure is stable. His heart rate at 25% beats faster than 100 prompting the above change

## 2011-12-06 NOTE — Assessment & Plan Note (Signed)
No recurrent af

## 2011-12-06 NOTE — Progress Notes (Signed)
HPI  [name] is a 56 y.o. male Seen in followup for an CRT-Dimplanted for primary prevention April 2013 for ischemic cardiomyopathy and prior myocardial infarction and right bundle branch block.  He is modestly improved following CRT. He is less winded. He still develops fatigue.  He is not currently doing much. There've been some propensity towards depression.  Past Medical History  Diagnosis Date  . Ischemic cardiomyopathy     EF 36%; prior anterior MI, s/p CABG x 3; s/p cath Feb 2013 showing grafts to be patent with severe LV dysfunction  . Chronic systolic heart failure     class 2-3  . Hyperlipidemia   . Hemochromatosis   . RBBB (right bundle branch block)   . Rotator cuff injury   . Sleep apnea   . PAF (paroxysmal atrial fibrillation)   . Anemia   . AMI anterior wall 2006  . CHF (congestive heart failure)   . Angina     "upon exertion"  . Pneumonia ~ 2001    "chemical; from smelling chlorox"  . Exertional dyspnea   . Type II diabetes mellitus   . ICD (implantable cardiac defibrillator) in place   . Pacemaker     Past Surgical History  Procedure Date  . Cardiac catheterization 03/12/2005    INFERIOR WALL AKINESIA. THERE IS GLOBAL HYPOKINESIA WITH OVERALL MODERATE TO SEVERE LEFT VENTRICULAR SYSTOLIC DYSFUNCTION. EF 35%  . Coronary artery bypass graft 2006    Emergent CABG x 3 with LIMA to LAD, SVG to OM, SVG to distal RCA per Dr. Tyrone Sage  . Rotator cuff repair ?2000/~ 2007    left; right   . Cardiac catheterization Feb 2013    Grafts patent. EF is 20 to 25%.  . Cardiac defibrillator placement 08/21/11    "pacer/defib"  . Insert / replace / remove pacemaker     Current Outpatient Prescriptions  Medication Sig Dispense Refill  . ALPRAZolam (XANAX) 0.25 MG tablet Take 0.25 mg by mouth at bedtime as needed. For anxiety      . aspirin EC 81 MG tablet Take 81 mg by mouth daily.      . carvedilol (COREG) 25 MG tablet Take 0.5 tablets (12.5 mg total) by mouth  2 (two) times daily.  30 tablet  11  . fenofibrate (TRICOR) 145 MG tablet Take 1 tablet (145 mg total) by mouth daily.  30 tablet  11  . furosemide (LASIX) 20 MG tablet Take 0.5 tablets (10 mg total) by mouth daily.  30 tablet  6  . lisinopril (PRINIVIL,ZESTRIL) 10 MG tablet Take 10 mg by mouth 2 (two) times daily.      . metFORMIN (GLUCOPHAGE) 500 MG tablet Take 500 mg by mouth 2 (two) times daily with a meal.        . nitroGLYCERIN (NITROSTAT) 0.4 MG SL tablet Place 0.4 mg under the tongue every 5 (five) minutes as needed. For chest pain      . spironolactone (ALDACTONE) 25 MG tablet Take 12.5 mg by mouth daily.         No Known Allergies  Review of Systems negative except from HPI and PMH  Physical Exam BP 121/78  Pulse 70  Ht 6' (1.829 m)  Wt 213 lb 6.4 oz (96.798 kg)  BMI 28.94 kg/m2 Well developed and well nourished in no acute distress HENT normal E scleral and icterus clear Neck Supple JVP flat; carotids brisk and full Clear to ausculation Device pocket well healed; without hematoma or  erythema Regular rate and rhythm, no murmurs gallops or rub Soft with active bowel sounds No clubbing cyanosis none Edema Alert and oriented, grossly normal motor and sensory function Skin Warm and Dry    Assessment and  Plan

## 2011-12-06 NOTE — Assessment & Plan Note (Signed)
Stable class III symptoms. AV optimization can be undertaken if the symptoms don't continue to improve

## 2011-12-13 ENCOUNTER — Other Ambulatory Visit (INDEPENDENT_AMBULATORY_CARE_PROVIDER_SITE_OTHER): Payer: BC Managed Care – PPO

## 2011-12-13 DIAGNOSIS — E785 Hyperlipidemia, unspecified: Secondary | ICD-10-CM

## 2011-12-13 LAB — HEPATIC FUNCTION PANEL
Albumin: 4 g/dL (ref 3.5–5.2)
Bilirubin, Direct: 0.2 mg/dL (ref 0.0–0.3)
Total Protein: 6.8 g/dL (ref 6.0–8.3)

## 2011-12-13 LAB — LIPID PANEL
HDL: 39.6 mg/dL (ref >39.00)
Triglycerides: 232 mg/dL — ABNORMAL HIGH (ref 0.0–149.0)
VLDL: 46.4 mg/dL — ABNORMAL HIGH (ref 0.0–40.0)

## 2011-12-14 ENCOUNTER — Encounter: Payer: Self-pay | Admitting: Internal Medicine

## 2011-12-29 ENCOUNTER — Ambulatory Visit (INDEPENDENT_AMBULATORY_CARE_PROVIDER_SITE_OTHER): Payer: BC Managed Care – PPO | Admitting: Cardiology

## 2011-12-29 ENCOUNTER — Encounter: Payer: Self-pay | Admitting: Cardiology

## 2011-12-29 VITALS — BP 100/60 | HR 85 | Ht 72.0 in | Wt 215.0 lb

## 2011-12-29 DIAGNOSIS — I4891 Unspecified atrial fibrillation: Secondary | ICD-10-CM

## 2011-12-29 DIAGNOSIS — I2510 Atherosclerotic heart disease of native coronary artery without angina pectoris: Secondary | ICD-10-CM

## 2011-12-29 DIAGNOSIS — I255 Ischemic cardiomyopathy: Secondary | ICD-10-CM

## 2011-12-29 DIAGNOSIS — E785 Hyperlipidemia, unspecified: Secondary | ICD-10-CM

## 2011-12-29 DIAGNOSIS — I5022 Chronic systolic (congestive) heart failure: Secondary | ICD-10-CM

## 2011-12-29 DIAGNOSIS — I2589 Other forms of chronic ischemic heart disease: Secondary | ICD-10-CM

## 2011-12-29 DIAGNOSIS — Z95810 Presence of automatic (implantable) cardiac defibrillator: Secondary | ICD-10-CM

## 2011-12-29 DIAGNOSIS — I480 Paroxysmal atrial fibrillation: Secondary | ICD-10-CM

## 2011-12-29 MED ORDER — EZETIMIBE 10 MG PO TABS: 10.0000 mg | ORAL_TABLET | Freq: Every day | ORAL | Status: DC

## 2011-12-29 NOTE — Progress Notes (Signed)
[name] Date of Birth: May 16, 1955 Medical Record #[id number]  History of Present Illness: [name] is seen back today for a followup visit. He underwent placement of the ICD/CRT on 08/21/2011. Since then he has noted continued improvement in his shortness of breath and energy level. He still has occasions where he runs out of air. He does get dizzy if he bends over. He has been monitoring his blood pressure and it has been very steady between 98 and 107 systolic. His carvedilol dose was increased last month to 25 mg in the evening. He has tolerated this well. His pulse is typically in the 70s. He denies any increase in edema.  Current Outpatient Prescriptions on File Prior to Visit  Medication Sig Dispense Refill  . ALPRAZolam (XANAX) 0.25 MG tablet Take 0.25 mg by mouth at bedtime as needed. For anxiety      . aspirin EC 81 MG tablet Take 81 mg by mouth daily.      . carvedilol (COREG) 25 MG tablet Take 12.5 mg in morning and 25 mg in evening  30 tablet  11  . fenofibrate (TRICOR) 145 MG tablet Take 1 tablet (145 mg total) by mouth daily.  30 tablet  11  . furosemide (LASIX) 20 MG tablet Take 0.5 tablets (10 mg total) by mouth daily.  30 tablet  6  . lisinopril (PRINIVIL,ZESTRIL) 10 MG tablet Take 10 mg by mouth 2 (two) times daily.      . metFORMIN (GLUCOPHAGE) 500 MG tablet Take 500 mg by mouth 2 (two) times daily with a meal.        . nitroGLYCERIN (NITROSTAT) 0.4 MG SL tablet Place 0.4 mg under the tongue every 5 (five) minutes as needed. For chest pain      . spironolactone (ALDACTONE) 25 MG tablet Take 12.5 mg by mouth daily.       Marland Kitchen ezetimibe (ZETIA) 10 MG tablet Take 1 tablet (10 mg total) by mouth daily.  30 tablet  11    Allergies  Allergen Reactions  . Statins Other (See Comments)    Myalgias     Past Medical History  Diagnosis Date  . Ischemic cardiomyopathy     EF 36%; prior anterior MI, s/p CABG x 3; s/p cath Feb 2013 showing grafts to be patent with severe LV  dysfunction  . Chronic systolic heart failure     class 2-3  . Hyperlipidemia   . Hemochromatosis   . RBBB (right bundle branch block)   . Rotator cuff injury   . Sleep apnea   . PAF (paroxysmal atrial fibrillation)   . Anemia   . AMI anterior wall 2006  . CHF (congestive heart failure)   . Angina     "upon exertion"  . Pneumonia ~ 2001    "chemical; from smelling chlorox"  . Exertional dyspnea   . Type II diabetes mellitus   . ICD (implantable cardiac defibrillator) in place   . Pacemaker   . ICD-Medtronic-CRT 08/24/2011    Past Surgical History  Procedure Date  . Cardiac catheterization 03/12/2005    INFERIOR WALL AKINESIA. THERE IS GLOBAL HYPOKINESIA WITH OVERALL MODERATE TO SEVERE LEFT VENTRICULAR SYSTOLIC DYSFUNCTION. EF 35%  . Coronary artery bypass graft 2006    Emergent CABG x 3 with LIMA to LAD, SVG to OM, SVG to distal RCA per Dr. Tyrone Sage  . Rotator cuff repair ?2000/~ 2007    left; right   . Cardiac catheterization Feb 2013    Grafts  patent. EF is 20 to 25%.  . Cardiac defibrillator placement 08/21/11    "pacer/defib"  . Insert / replace / remove pacemaker     History  Smoking status  . Former Smoker -- 2.0 packs/day for 30 years  . Types: Cigarettes  . Quit date: 03/11/2005  Smokeless tobacco  . Never Used    History  Alcohol Use  . 1.8 oz/week  . 3 Cans of beer per week    Family History  Problem Relation Age of Onset  . Adopted: Yes    Review of Systems: The review of systems is per the HPI.  All other systems were reviewed and are negative.  Physical Exam: BP 100/60  Pulse 85  Ht 6' (1.829 m)  Wt 215 lb (97.523 kg)  BMI 29.16 kg/m2  SpO2 97% Patient is very pleasant and in no acute distress. Skin is warm and dry. Color is normal.  HEENT is unremarkable. Normocephalic/atraumatic. PERRL. Sclera are nonicteric. Neck is supple. No masses. No JVD. His ICD site has healed well. Lungs are clear. Cardiac exam shows a regular rate and rhythm.  Abdomen is soft. Extremities are without edema. Gait and ROM are intact. No gross neurologic deficits noted.    LABORATORY DATA: Lab Results  Component Value Date   CHOL 247* 12/13/2011   HDL 39.60 12/13/2011   LDLCALC 116* 01/13/2011   LDLDIRECT 173.5 12/13/2011   TRIG 232.0* 12/13/2011   CHOLHDL 6 12/13/2011     Assessment / Plan: 1. Chronic systolic CHF with ejection fraction of 20-25%. Clinically improved with biventricular ICD pacing. Currently class II. He does not appear to be volume overloaded. We will continue with his Aldactone and Lasix 10 mg daily. We will try to increase his carvedilol to 25 mg twice a day. If he has increased dizziness or hypotension on this we can reduce his dose back.  2. Ischemic cardiomyopathy.  3. Coronary disease status post CABG in 2006. Cardiac catheterization in February 2013 showed all grafts were patent.  4. Right bundle branch block.  5. Hyperlipidemia. Patient is intolerant to statins. He is on fenofibrate with improvement in his triglyceride levels. We will add Zetia 10 mg daily and repeat labs in 4 months.

## 2011-12-29 NOTE — Patient Instructions (Addendum)
Try and increase the carvedilol to 25 mg twice a day. Add Zetia 10 mg daily for cholesterol  Continue your other therapy.  I will see you again in 4 months with lab work

## 2012-01-02 ENCOUNTER — Ambulatory Visit (INDEPENDENT_AMBULATORY_CARE_PROVIDER_SITE_OTHER): Payer: BC Managed Care – PPO | Admitting: General Surgery

## 2012-01-02 ENCOUNTER — Encounter (INDEPENDENT_AMBULATORY_CARE_PROVIDER_SITE_OTHER): Payer: Self-pay | Admitting: General Surgery

## 2012-01-02 ENCOUNTER — Encounter (INDEPENDENT_AMBULATORY_CARE_PROVIDER_SITE_OTHER): Payer: Self-pay

## 2012-01-02 VITALS — BP 122/80 | HR 80 | Temp 96.8°F | Resp 18 | Ht 72.0 in | Wt 212.4 lb

## 2012-01-02 DIAGNOSIS — K4090 Unilateral inguinal hernia, without obstruction or gangrene, not specified as recurrent: Secondary | ICD-10-CM | POA: Insufficient documentation

## 2012-01-02 NOTE — Progress Notes (Signed)
Patient ID: [name], male   DOB: 18-May-1955, 56 y.o.   MRN: [id number]  Chief Complaint  Patient presents with  . New Evaluation    hernia    HPI [name] is a 56 y.o. male.  The patient comes in with a known right inguinal hernia that has become more symptomatic over the last several weeks.  The patient had a previous left inguinal hernia repair by Dr. Jerelene Redden in 1998. At that time he had no problems with his heart but had a massive myocardial infarction in 2006. That time he underwent coronary artery bypass grafting x3. He done well from that surgery until February of 2013 at which time he was noted to be in significant right heart failure. He was found have an ejection fraction between 20-25%. He recently underwent the placement of a AICD/pacemaker by Dr. Graciela Husbands.  The patient has not had any procedures since his AICD/pacemaker had been put in place. The only blood thinner that the patient is taking is aspirin baby aspirin a day. We will stop that at least 5 days prior to surgery. HPI  Past Medical History  Diagnosis Date  . Ischemic cardiomyopathy     EF 36%; prior anterior MI, s/p CABG x 3; s/p cath Feb 2013 showing grafts to be patent with severe LV dysfunction  . Chronic systolic heart failure     class 2-3  . Hyperlipidemia   . Hemochromatosis   . RBBB (right bundle branch block)   . Rotator cuff injury   . Sleep apnea   . PAF (paroxysmal atrial fibrillation)   . Anemia   . AMI anterior wall 2006  . CHF (congestive heart failure)   . Angina     "upon exertion"  . Pneumonia ~ 2001    "chemical; from smelling chlorox"  . Exertional dyspnea   . Type II diabetes mellitus   . ICD (implantable cardiac defibrillator) in place   . Pacemaker   . ICD-Medtronic-CRT 08/24/2011    Past Surgical History  Procedure Date  . Cardiac catheterization 03/12/2005    INFERIOR WALL AKINESIA. THERE IS GLOBAL HYPOKINESIA WITH OVERALL MODERATE TO SEVERE LEFT VENTRICULAR  SYSTOLIC DYSFUNCTION. EF 35%  . Coronary artery bypass graft 2006    Emergent CABG x 3 with LIMA to LAD, SVG to OM, SVG to distal RCA per Dr. Tyrone Sage  . Rotator cuff repair ?2000/~ 2007    left; right   . Cardiac catheterization Feb 2013    Grafts patent. EF is 20 to 25%.  . Cardiac defibrillator placement 08/21/11    "pacer/defib"  . Insert / replace / remove pacemaker     Family History  Problem Relation Age of Onset  . Adopted: Yes    Social History History  Substance Use Topics  . Smoking status: Former Smoker -- 2.0 packs/day for 30 years    Types: Cigarettes    Quit date: 03/11/2005  . Smokeless tobacco: Never Used  . Alcohol Use: 1.8 oz/week    3 Cans of beer per week    Allergies  Allergen Reactions  . Statins Other (See Comments)    Myalgias     Current Outpatient Prescriptions  Medication Sig Dispense Refill  . ALPRAZolam (XANAX) 0.25 MG tablet Take 0.25 mg by mouth at bedtime as needed. For anxiety      . aspirin EC 81 MG tablet Take 81 mg by mouth daily.      . carvedilol (COREG) 25 MG tablet Take  12.5 mg in morning and 25 mg in evening  30 tablet  11  . ezetimibe (ZETIA) 10 MG tablet Take 1 tablet (10 mg total) by mouth daily.  30 tablet  11  . fenofibrate (TRICOR) 145 MG tablet Take 1 tablet (145 mg total) by mouth daily.  30 tablet  11  . furosemide (LASIX) 20 MG tablet Take 0.5 tablets (10 mg total) by mouth daily.  30 tablet  6  . lisinopril (PRINIVIL,ZESTRIL) 10 MG tablet Take 10 mg by mouth 2 (two) times daily.      . metFORMIN (GLUCOPHAGE) 500 MG tablet Take 500 mg by mouth 2 (two) times daily with a meal.        . nitroGLYCERIN (NITROSTAT) 0.4 MG SL tablet Place 0.4 mg under the tongue every 5 (five) minutes as needed. For chest pain      . spironolactone (ALDACTONE) 25 MG tablet Take 12.5 mg by mouth daily.         Review of Systems Review of Systems  Constitutional: Negative.   HENT: Negative.   Eyes: Negative.   Respiratory: Negative.     Cardiovascular: Positive for palpitations. Negative for chest pain and leg swelling.  Gastrointestinal: Negative.   Genitourinary: Negative.   Neurological: Negative.   Hematological: Negative.   Psychiatric/Behavioral: Negative.     Blood pressure 122/80, pulse 80, temperature 96.8 F (36 C), temperature source Temporal, resp. rate 18, height 6' (1.829 m), weight 212 lb 6.4 oz (96.344 kg), SpO2 99.00%.  Physical Exam Physical Exam  Constitutional: He appears well-developed and well-nourished.  HENT:  Head: Normocephalic and atraumatic.  Eyes: EOM are normal. Pupils are equal, round, and reactive to light.  Neck: Normal range of motion. Neck supple.  Cardiovascular: Normal rate, regular rhythm and normal heart sounds.   Pulmonary/Chest: Effort normal and breath sounds normal.    Abdominal: Soft. Normal appearance. There is no tenderness. A hernia is present. Hernia confirmed positive in the right inguinal area (reducible in standing position).    Genitourinary: Penis normal.  Musculoskeletal: Normal range of motion.  Neurological: He is alert.  Skin: Skin is warm.  Psychiatric: He has a normal mood and affect. His behavior is normal. Judgment and thought content normal.    Data Reviewed Prior history and physical  Assessment    Reducible RIH, likely direct Significant medical co-morbidities, cardiac in particular    Plan    Cardiac clearance, then surgery Risk and benefits have been explained to the patient and her wishes to proceed.       Cherylynn Ridges 01/02/2012, 10:43 AM

## 2012-01-09 ENCOUNTER — Telehealth: Payer: Self-pay

## 2012-01-09 NOTE — Telephone Encounter (Signed)
Received surgical clearance request from [facility].Dr.Jordan signed letter clearing patient for upcoming surgery.Letter faxed to [phone],attention [facility].

## 2012-01-10 ENCOUNTER — Encounter (INDEPENDENT_AMBULATORY_CARE_PROVIDER_SITE_OTHER): Payer: Self-pay

## 2012-01-24 ENCOUNTER — Telehealth: Payer: Self-pay | Admitting: Cardiology

## 2012-01-24 NOTE — Telephone Encounter (Signed)
[name] given our correct fax # of [phone] to fax over documents regarding patient.

## 2012-01-24 NOTE — Telephone Encounter (Signed)
F/u   Patient family returning nurse call, xfer to [employer] [facility]

## 2012-01-24 NOTE — Telephone Encounter (Signed)
New problem:    Checking on the status of form that was sent over on 10/8.

## 2012-01-27 ENCOUNTER — Other Ambulatory Visit: Payer: Self-pay | Admitting: Cardiology

## 2012-01-30 ENCOUNTER — Telehealth: Payer: Self-pay | Admitting: Cardiology

## 2012-01-30 NOTE — Telephone Encounter (Signed)
Plz return call to [facility] [employer] [phone] Ext [phone]   Regarding missing pages of faxed documents

## 2012-01-30 NOTE — Telephone Encounter (Signed)
Maria from [employer] called no answer.LMTC.

## 2012-01-31 ENCOUNTER — Encounter (HOSPITAL_COMMUNITY): Payer: Self-pay | Admitting: Pharmacist

## 2012-02-01 ENCOUNTER — Encounter (HOSPITAL_COMMUNITY)
Admission: RE | Admit: 2012-02-01 | Discharge: 2012-02-01 | Disposition: A | Discharge status: Home / Self Care | Payer: BC Managed Care – PPO | Source: Ambulatory Visit | Attending: General Surgery | Admitting: General Surgery

## 2012-02-01 ENCOUNTER — Encounter (HOSPITAL_COMMUNITY): Payer: Self-pay

## 2012-02-01 LAB — CBC WITH DIFFERENTIAL/PLATELET
Basophils Relative: 1 % (ref 0–1)
Hemoglobin: 12.3 g/dL — ABNORMAL LOW (ref 13.0–17.0)
Lymphocytes Relative: 31 % (ref 12–46)
Lymphs Abs: 1.4 10*3/uL (ref 0.7–4.0)
MCHC: 34.4 g/dL (ref 30.0–36.0)
Monocytes Relative: 11 % (ref 3–12)
Neutro Abs: 2.4 10*3/uL (ref 1.7–7.7)
Neutrophils Relative %: 55 % (ref 43–77)
RBC: 3.79 MIL/uL — ABNORMAL LOW (ref 4.22–5.81)
WBC: 4.4 10*3/uL (ref 4.0–10.5)

## 2012-02-01 LAB — BASIC METABOLIC PANEL
Chloride: 101 mEq/L (ref 96–112)
GFR calc Af Amer: 57 mL/min — ABNORMAL LOW (ref >90)
GFR calc non Af Amer: 49 mL/min — ABNORMAL LOW (ref >90)
Potassium: 4.2 mEq/L (ref 3.5–5.1)
Sodium: 137 mEq/L (ref 135–145)

## 2012-02-01 LAB — SURGICAL PCR SCREEN: MRSA, PCR: NEGATIVE

## 2012-02-01 NOTE — Progress Notes (Signed)
medtronic ,Leta Jungling notified re ICD.form also faxed to [employer].

## 2012-02-01 NOTE — Telephone Encounter (Signed)
[name] from [employer] called no answer.Messages left to return call,never returned call.

## 2012-02-01 NOTE — Pre-Procedure Instructions (Signed)
[address]  02/01/2012   Your procedure is scheduled on:  02/09/12  Report to [facility] at 830 AM.  Call this number if you have problems the morning of surgery: [phone]   Remember:   Do not eat food:After Midnight.      Take these medicines the morning of surgery with A SIP OF WATER: carvedilol,aldactone   Do not wear jewelry, make-up or nail polish.  Do not wear lotions, powders, or perfumes. You may wear deodorant.  Do not shave 48 hours prior to surgery. Men may shave face and neck.  Do not bring valuables to the hospital.  Contacts, dentures or bridgework may not be worn into surgery.  Leave suitcase in the car. After surgery it may be brought to your room.  For patients admitted to the hospital, checkout time is 11:00 AM the day of discharge.   Patients discharged the day of surgery will not be allowed to drive home.  Name and phone number of your driver: family  Special Instructions: Shower using CHG 2 nights before surgery and the night before surgery.  If you shower the day of surgery use CHG.  Use special wash - you have one bottle of CHG for all showers.  You should use approximately 1/3 of the bottle for each shower.   Please read over the following fact sheets that you were given: Pain Booklet, Coughing and Deep Breathing, MRSA Information and Surgical Site Infection Prevention

## 2012-02-01 NOTE — Telephone Encounter (Signed)
[name] called no answer.LMTC.

## 2012-02-02 NOTE — Consult Note (Signed)
Anesthesia Chart Review:  Patient is a 56 year old male scheduled for an open right IHR on 02/09/12 by Dr. Lindie Spruce. History includes former smoker, ischemic cardiomyopathy, chronic systolic heart failure (EF 45-50% by 06/19/11 echo), s/p Medtronic ICD 08/21/11,  CAD/anterior MI '06 s/p CABG (LIMA to LAD, SVG to OM, SVG to distal RCA), paroxysmal atrial fibrillation, right BBB,  hyperlipidemia, hemochromatosis, paroxysmal atrial fibrillation, anemia, pneumonia 2001, diabetes mellitus type 2, obstructive sleep apnea.  PCP is Dr. Geoffry Paradise.  Cardiologist is Dr. Swaziland who cleared patient with "low risk, from a cardiac standpoint."  EP Cardiologist is Dr. Graciela Husbands.  He recommended placing a magnet over the ICD intra-operatively--see form.  EKG on 08/22/2011 showed normal sinus rhythm, AV sequential pacemaker, atrial sensing, ventricular pacing.  Cardiac cath on 05/25/11 showed severe three-vessel obstructive atherosclerotic coronary disease, patent saphenous vein graft to the distal RCA and patent saphenous vein graft to the second obtuse marginal vessel. These grafts are very small in caliber do to small target vessels. Patent LIMA graft to LAD. Severe left ventricular dysfunction. Dr. Swaziland recommended aggressive medical therapy with up titration of his cardiac medications for CHF and consideration for ICD/biventricular pacing.  Echo on 06/19/11 showed: Left ventricle: The cavity size was mildly dilated. Systolic function was mildly reduced. The estimated ejection fraction was in the range of 45% to 50%. There is moderate hypokinesis of the mid-distalanteroseptal and apical myocardium. - Atrial septum: No defect or patent foramen ovale was identified.  Chest x-ray from there were 03/29/2012 showed no acute cardiopulmonary process.  Labs noted.  BUN/Cr 24/1.54 (Cr 1.2-1.3 since March 2013).  H/H 12.3/35.8.    If not significant change in his status then anticipate he can proceed as  planned.  Shonna Chock, PA-C

## 2012-02-08 MED ORDER — CEFAZOLIN SODIUM-DEXTROSE 2-3 GM-% IV SOLR
2.0000 g | INTRAVENOUS | Status: AC
  Administered 2012-02-09: 2 g via INTRAVENOUS
  Filled 2012-02-08: qty 50

## 2012-02-08 NOTE — Progress Notes (Signed)
Pt. Informed of change in surgery time,to arrive at 0530 AM . Pt. Voices understanding.

## 2012-02-09 ENCOUNTER — Encounter (HOSPITAL_COMMUNITY): Payer: Self-pay | Admitting: Vascular Surgery

## 2012-02-09 ENCOUNTER — Ambulatory Visit (HOSPITAL_COMMUNITY)
Admission: RE | Admit: 2012-02-09 | Discharge: 2012-02-10 | Disposition: A | Discharge status: Home / Self Care | Payer: BC Managed Care – PPO | Source: Ambulatory Visit | Attending: General Surgery | Admitting: General Surgery

## 2012-02-09 ENCOUNTER — Encounter (HOSPITAL_COMMUNITY): Payer: Self-pay | Admitting: *Deleted

## 2012-02-09 ENCOUNTER — Encounter (HOSPITAL_COMMUNITY): Payer: Self-pay | Admitting: General Practice

## 2012-02-09 ENCOUNTER — Encounter (HOSPITAL_COMMUNITY)
Admission: RE | Disposition: A | Discharge status: Home / Self Care | Payer: Self-pay | Source: Ambulatory Visit | Attending: General Surgery

## 2012-02-09 ENCOUNTER — Ambulatory Visit (HOSPITAL_COMMUNITY): Payer: BC Managed Care – PPO | Admitting: Vascular Surgery

## 2012-02-09 DIAGNOSIS — Z4502 Encounter for adjustment and management of automatic implantable cardiac defibrillator: Secondary | ICD-10-CM | POA: Diagnosis present

## 2012-02-09 DIAGNOSIS — I252 Old myocardial infarction: Secondary | ICD-10-CM | POA: Insufficient documentation

## 2012-02-09 DIAGNOSIS — Z01812 Encounter for preprocedural laboratory examination: Secondary | ICD-10-CM | POA: Insufficient documentation

## 2012-02-09 DIAGNOSIS — E119 Type 2 diabetes mellitus without complications: Secondary | ICD-10-CM | POA: Diagnosis present

## 2012-02-09 DIAGNOSIS — I4510 Unspecified right bundle-branch block: Secondary | ICD-10-CM | POA: Diagnosis present

## 2012-02-09 DIAGNOSIS — I480 Paroxysmal atrial fibrillation: Secondary | ICD-10-CM | POA: Diagnosis present

## 2012-02-09 DIAGNOSIS — K4090 Unilateral inguinal hernia, without obstruction or gangrene, not specified as recurrent: Secondary | ICD-10-CM

## 2012-02-09 DIAGNOSIS — I5022 Chronic systolic (congestive) heart failure: Secondary | ICD-10-CM | POA: Diagnosis present

## 2012-02-09 DIAGNOSIS — I2510 Atherosclerotic heart disease of native coronary artery without angina pectoris: Secondary | ICD-10-CM | POA: Insufficient documentation

## 2012-02-09 DIAGNOSIS — I255 Ischemic cardiomyopathy: Secondary | ICD-10-CM | POA: Diagnosis present

## 2012-02-09 DIAGNOSIS — K4030 Unilateral inguinal hernia, with obstruction, without gangrene, not specified as recurrent: Secondary | ICD-10-CM | POA: Insufficient documentation

## 2012-02-09 HISTORY — PX: INSERTION OF MESH: SHX5868

## 2012-02-09 HISTORY — PX: INGUINAL HERNIA REPAIR: SHX194

## 2012-02-09 HISTORY — DX: Atherosclerotic heart disease of native coronary artery without angina pectoris: I25.10

## 2012-02-09 LAB — GLUCOSE, CAPILLARY
Glucose-Capillary: 150 mg/dL — ABNORMAL HIGH (ref 70–99)
Glucose-Capillary: 158 mg/dL — ABNORMAL HIGH (ref 70–99)
Glucose-Capillary: 168 mg/dL — ABNORMAL HIGH (ref 70–99)

## 2012-02-09 SURGERY — REPAIR, HERNIA, INGUINAL, ADULT
Anesthesia: General | Site: Groin | Laterality: Right | Wound class: Clean

## 2012-02-09 MED ORDER — PROPOFOL 10 MG/ML IV BOLUS
INTRAVENOUS | Status: DC | PRN
  Administered 2012-02-09: 140 mg via INTRAVENOUS

## 2012-02-09 MED ORDER — BUPIVACAINE HCL (PF) 0.25 % IJ SOLN
INTRAMUSCULAR | Status: AC
  Filled 2012-02-09: qty 30

## 2012-02-09 MED ORDER — FUROSEMIDE 20 MG PO TABS
10.0000 mg | ORAL_TABLET | Freq: Every day | ORAL | Status: DC
  Administered 2012-02-09: 10 mg via ORAL
  Filled 2012-02-09 (×2): qty 0.5

## 2012-02-09 MED ORDER — FENOFIBRATE 160 MG PO TABS
160.0000 mg | ORAL_TABLET | Freq: Every day | ORAL | Status: DC
  Administered 2012-02-09: 160 mg via ORAL
  Filled 2012-02-09 (×2): qty 1

## 2012-02-09 MED ORDER — ONDANSETRON HCL 4 MG PO TABS: 4.0000 mg | ORAL_TABLET | Freq: Four times a day (QID) | ORAL | Status: DC | PRN

## 2012-02-09 MED ORDER — HYDROMORPHONE HCL PF 1 MG/ML IJ SOLN
INTRAMUSCULAR | Status: AC
  Filled 2012-02-09: qty 1

## 2012-02-09 MED ORDER — SPIRONOLACTONE 12.5 MG HALF TABLET
12.5000 mg | ORAL_TABLET | Freq: Every day | ORAL | Status: DC
  Filled 2012-02-09 (×2): qty 1

## 2012-02-09 MED ORDER — FENTANYL CITRATE 0.05 MG/ML IJ SOLN
INTRAMUSCULAR | Status: DC | PRN
  Administered 2012-02-09: 25 ug via INTRAVENOUS
  Administered 2012-02-09: 50 ug via INTRAVENOUS
  Administered 2012-02-09 (×3): 25 ug via INTRAVENOUS

## 2012-02-09 MED ORDER — KCL IN DEXTROSE-NACL 20-5-0.45 MEQ/L-%-% IV SOLN
INTRAVENOUS | Status: AC
  Filled 2012-02-09: qty 1000

## 2012-02-09 MED ORDER — MIDAZOLAM HCL 5 MG/5ML IJ SOLN
INTRAMUSCULAR | Status: DC | PRN
  Administered 2012-02-09: 2 mg via INTRAVENOUS

## 2012-02-09 MED ORDER — KCL IN DEXTROSE-NACL 20-5-0.45 MEQ/L-%-% IV SOLN
INTRAVENOUS | Status: DC
  Filled 2012-02-09 (×2): qty 1000

## 2012-02-09 MED ORDER — SODIUM CHLORIDE 0.9 % IR SOLN
Status: DC | PRN
  Administered 2012-02-09: 08:00:00

## 2012-02-09 MED ORDER — LACTATED RINGERS IV SOLN
INTRAVENOUS | Status: DC | PRN
  Administered 2012-02-09 (×2): via INTRAVENOUS

## 2012-02-09 MED ORDER — NITROGLYCERIN 0.4 MG SL SUBL: 0.4000 mg | SUBLINGUAL_TABLET | SUBLINGUAL | Status: DC | PRN

## 2012-02-09 MED ORDER — POTASSIUM CHLORIDE IN NACL 20-0.9 MEQ/L-% IV SOLN
INTRAVENOUS | Status: DC
  Administered 2012-02-09 – 2012-02-10 (×2): via INTRAVENOUS
  Filled 2012-02-09 (×5): qty 1000

## 2012-02-09 MED ORDER — FENOFIBRATE 160 MG PO TABS
160.0000 mg | ORAL_TABLET | Freq: Every day | ORAL | Status: DC
  Filled 2012-02-09: qty 1

## 2012-02-09 MED ORDER — ONDANSETRON HCL 4 MG/2ML IJ SOLN: 4.0000 mg | Freq: Four times a day (QID) | INTRAMUSCULAR | Status: DC | PRN

## 2012-02-09 MED ORDER — CEFAZOLIN SODIUM 1-5 GM-% IV SOLN
1.0000 g | Freq: Three times a day (TID) | INTRAVENOUS | Status: AC
  Administered 2012-02-09: 1 g via INTRAVENOUS
  Filled 2012-02-09: qty 50

## 2012-02-09 MED ORDER — 0.9 % SODIUM CHLORIDE (POUR BTL) OPTIME
TOPICAL | Status: DC | PRN
  Administered 2012-02-09: 1000 mL

## 2012-02-09 MED ORDER — LISINOPRIL 10 MG PO TABS
10.0000 mg | ORAL_TABLET | Freq: Two times a day (BID) | ORAL | Status: DC
  Administered 2012-02-09 (×2): 10 mg via ORAL
  Filled 2012-02-09 (×4): qty 1

## 2012-02-09 MED ORDER — ALBUMIN HUMAN 5 % IV SOLN
INTRAVENOUS | Status: DC | PRN
  Administered 2012-02-09: 09:00:00 via INTRAVENOUS

## 2012-02-09 MED ORDER — INSULIN ASPART 100 UNIT/ML ~~LOC~~ SOLN
0.0000 [IU] | Freq: Three times a day (TID) | SUBCUTANEOUS | Status: DC
  Administered 2012-02-09: 2 [IU] via SUBCUTANEOUS

## 2012-02-09 MED ORDER — PHENYLEPHRINE HCL 10 MG/ML IJ SOLN
INTRAMUSCULAR | Status: DC | PRN
  Administered 2012-02-09: 40 ug via INTRAVENOUS
  Administered 2012-02-09 (×5): 80 ug via INTRAVENOUS
  Administered 2012-02-09: 40 ug via INTRAVENOUS
  Administered 2012-02-09 (×6): 80 ug via INTRAVENOUS

## 2012-02-09 MED ORDER — DEXAMETHASONE SODIUM PHOSPHATE 4 MG/ML IJ SOLN: INTRAMUSCULAR | Status: DC | PRN

## 2012-02-09 MED ORDER — LIDOCAINE HCL (PF) 1 % IJ SOLN
INTRAMUSCULAR | Status: AC
  Filled 2012-02-09: qty 30

## 2012-02-09 MED ORDER — EZETIMIBE 10 MG PO TABS
10.0000 mg | ORAL_TABLET | Freq: Every day | ORAL | Status: DC
  Administered 2012-02-09: 10 mg via ORAL
  Filled 2012-02-09 (×2): qty 1

## 2012-02-09 MED ORDER — HYDROMORPHONE HCL PF 1 MG/ML IJ SOLN
0.2500 mg | INTRAMUSCULAR | Status: DC | PRN
  Administered 2012-02-09: 0.5 mg via INTRAVENOUS

## 2012-02-09 MED ORDER — BUPIVACAINE HCL (PF) 0.25 % IJ SOLN
INTRAMUSCULAR | Status: DC | PRN
  Administered 2012-02-09: 30 mL

## 2012-02-09 MED ORDER — LIDOCAINE HCL (PF) 1 % IJ SOLN
INTRAMUSCULAR | Status: DC | PRN
  Administered 2012-02-09: 30 mL

## 2012-02-09 MED ORDER — METFORMIN HCL 500 MG PO TABS: 500.0000 mg | ORAL_TABLET | Freq: Two times a day (BID) | ORAL | Status: DC

## 2012-02-09 MED ORDER — CARVEDILOL 25 MG PO TABS
25.0000 mg | ORAL_TABLET | Freq: Two times a day (BID) | ORAL | Status: DC
  Administered 2012-02-09 – 2012-02-10 (×2): 25 mg via ORAL
  Filled 2012-02-09 (×4): qty 1

## 2012-02-09 MED ORDER — LIDOCAINE HCL (CARDIAC) 20 MG/ML IV SOLN
INTRAVENOUS | Status: DC | PRN
  Administered 2012-02-09: 80 mg via INTRAVENOUS

## 2012-02-09 MED ORDER — OXYCODONE-ACETAMINOPHEN 5-325 MG PO TABS: 1.0000 | ORAL_TABLET | ORAL | Status: DC | PRN

## 2012-02-09 MED ORDER — HYDROMORPHONE HCL PF 1 MG/ML IJ SOLN
1.0000 mg | INTRAMUSCULAR | Status: DC | PRN
  Administered 2012-02-09 – 2012-02-10 (×3): 1 mg via INTRAVENOUS
  Filled 2012-02-09 (×3): qty 1

## 2012-02-09 MED ORDER — EPHEDRINE SULFATE 50 MG/ML IJ SOLN
INTRAMUSCULAR | Status: DC | PRN
  Administered 2012-02-09 (×2): 10 mg via INTRAVENOUS
  Administered 2012-02-09: 5 mg via INTRAVENOUS
  Administered 2012-02-09 (×2): 10 mg via INTRAVENOUS
  Administered 2012-02-09: 15 mg via INTRAVENOUS

## 2012-02-09 MED ORDER — ONDANSETRON HCL 4 MG/2ML IJ SOLN
INTRAMUSCULAR | Status: DC | PRN
  Administered 2012-02-09: 4 mg via INTRAVENOUS

## 2012-02-09 SURGICAL SUPPLY — 55 items
ADH SKN CLS APL DERMABOND .7 (GAUZE/BANDAGES/DRESSINGS) ×1
BAG DECANTER FOR FLEXI CONT (MISCELLANEOUS) ×2 IMPLANT
BLADE SURG 10 STRL SS (BLADE) ×2 IMPLANT
BLADE SURG 15 STRL LF DISP TIS (BLADE) ×1 IMPLANT
BLADE SURG 15 STRL SS (BLADE) ×2
BLADE SURG ROTATE 9660 (MISCELLANEOUS) ×2 IMPLANT
CANISTER SUCTION 2500CC (MISCELLANEOUS) IMPLANT
CHLORAPREP W/TINT 26ML (MISCELLANEOUS) ×2 IMPLANT
CLEANER TIP ELECTROSURG 2X2 (MISCELLANEOUS) ×2 IMPLANT
CLOTH BEACON ORANGE TIMEOUT ST (SAFETY) ×2 IMPLANT
COVER SURGICAL LIGHT HANDLE (MISCELLANEOUS) ×2 IMPLANT
DECANTER SPIKE VIAL GLASS SM (MISCELLANEOUS) ×1 IMPLANT
DERMABOND ADVANCED (GAUZE/BANDAGES/DRESSINGS) ×1
DERMABOND ADVANCED .7 DNX12 (GAUZE/BANDAGES/DRESSINGS) ×1 IMPLANT
DRAIN PENROSE 1/2X12 LTX STRL (WOUND CARE) ×1 IMPLANT
DRAPE LAPAROTOMY TRNSV 102X78 (DRAPE) ×2 IMPLANT
DRAPE UTILITY 15X26 W/TAPE STR (DRAPE) ×4 IMPLANT
DRSG TEGADERM 4X4.75 (GAUZE/BANDAGES/DRESSINGS) ×2 IMPLANT
ELECT REM PT RETURN 9FT ADLT (ELECTROSURGICAL) ×2
ELECTRODE REM PT RTRN 9FT ADLT (ELECTROSURGICAL) ×1 IMPLANT
GAUZE SPONGE 4X4 16PLY XRAY LF (GAUZE/BANDAGES/DRESSINGS) ×2 IMPLANT
GLOVE BIO SURGEON STRL SZ7 (GLOVE) ×1 IMPLANT
GLOVE BIOGEL PI IND STRL 7.0 (GLOVE) IMPLANT
GLOVE BIOGEL PI IND STRL 8 (GLOVE) ×1 IMPLANT
GLOVE BIOGEL PI INDICATOR 7.0 (GLOVE) ×1
GLOVE BIOGEL PI INDICATOR 8 (GLOVE) ×1
GLOVE ECLIPSE 7.5 STRL STRAW (GLOVE) ×2 IMPLANT
GLOVE SURG SS PI 7.0 STRL IVOR (GLOVE) ×1 IMPLANT
GOWN STRL NON-REIN LRG LVL3 (GOWN DISPOSABLE) ×4 IMPLANT
KIT BASIN OR (CUSTOM PROCEDURE TRAY) ×2 IMPLANT
KIT ROOM TURNOVER OR (KITS) ×2 IMPLANT
MESH HERNIA 3X6 (Mesh General) ×1 IMPLANT
NDL HYPO 25GX1X1/2 BEV (NEEDLE) ×1 IMPLANT
NEEDLE HYPO 25GX1X1/2 BEV (NEEDLE) ×2 IMPLANT
NS IRRIG 1000ML POUR BTL (IV SOLUTION) ×2 IMPLANT
PACK SURGICAL SETUP 50X90 (CUSTOM PROCEDURE TRAY) ×2 IMPLANT
PAD ARMBOARD 7.5X6 YLW CONV (MISCELLANEOUS) ×3 IMPLANT
PENCIL BUTTON HOLSTER BLD 10FT (ELECTRODE) ×3 IMPLANT
SPECIMEN JAR SMALL (MISCELLANEOUS) IMPLANT
SPONGE INTESTINAL PEANUT (DISPOSABLE) ×2 IMPLANT
SPONGE LAP 18X18 X RAY DECT (DISPOSABLE) ×2 IMPLANT
STRIP CLOSURE SKIN 1/2X4 (GAUZE/BANDAGES/DRESSINGS) ×2 IMPLANT
SUT ETHIBOND 0 MO6 C/R (SUTURE) ×3 IMPLANT
SUT MON AB 4-0 PC3 18 (SUTURE) ×2 IMPLANT
SUT PROLENE 0 CT 2 (SUTURE) ×4 IMPLANT
SUT VIC AB 3-0 SH 27 (SUTURE) ×4
SUT VIC AB 3-0 SH 27X BRD (SUTURE) ×2 IMPLANT
SUT VICRYL AB 3 0 TIES (SUTURE) ×2 IMPLANT
SYR BULB 3OZ (MISCELLANEOUS) ×2 IMPLANT
SYR CONTROL 10ML LL (SYRINGE) ×2 IMPLANT
TOWEL OR 17X24 6PK STRL BLUE (TOWEL DISPOSABLE) ×2 IMPLANT
TOWEL OR 17X26 10 PK STRL BLUE (TOWEL DISPOSABLE) ×2 IMPLANT
TUBE CONNECTING 12X1/4 (SUCTIONS) IMPLANT
WATER STERILE IRR 1000ML POUR (IV SOLUTION) IMPLANT
YANKAUER SUCT BULB TIP NO VENT (SUCTIONS) IMPLANT

## 2012-02-09 NOTE — Preoperative (Signed)
Beta Blockers   Reason not to administer Beta Blockers:Not Applicable 

## 2012-02-09 NOTE — Op Note (Signed)
OPERATIVE REPORT  DATE OF OPERATION: 02/09/2012  PATIENT:  [name]  56 y.Weaver. male  PRE-OPERATIVE DIAGNOSIS:  RIGHT INGUINAL HERNIA  POST-OPERATIVE DIAGNOSIS:  RIGHT INGUINAL HERNIA  PROCEDURE:  Procedure(s): HERNIA REPAIR INGUINAL ADULT INSERTION OF MESH  SURGEON:  Surgeon(s): Cherylynn Ridges, MD  ASSISTANT: None  ANESTHESIA:   general  EBL: <50 ml  BLOOD ADMINISTERED: none  DRAINS: none   SPECIMEN:  No Specimen  COUNTS CORRECT:  YES  PROCEDURE DETAILS: The patient was taken to the operating room and placed on the table in the supine position. After an adequate general laryngeal airway anesthetic was administered and also after a magnet had been placed on his AICD on his left anterior chest wall he was prepped and draped in usual sterile manner exposing his right inguinal area.  After proper time out was performed identifying the patient and the procedure to be performed a right inguinal transverse curvilinear incision was made using #10 blade and taken down to subcutaneous tissue. We used electrocautery to dissect down deep to the external oblique fascia. The patient had a large incarcerated right in the hernia. We opened the external oblique fascia along its fibers down through the superficial ring. We were able to dissect out the spermatic cord and the hernia at the pubic tubercle and placed a Penrose drain around it to mobilize it..  We dissected away the pantaloon hernia with indirect and direct components away from the spermatic cord. This allowed [place] to open the indirect sac and then subsequently suture ligate it at the base using multiple sutures of 0 Ethibond suture. A large lipoma of the cord also associated with this which was resected and suture ligated at its base using an 0 Ethibond suture. We then repaired the floor of and the primary hernia defect.  Teh hernia defect was repaired using an oval piece of mesh measuring approximately 5 x 3 cm in size. Medially  it was attached to the pubic tubercle anteromedially it was attached to the conjoin tendon and inferolaterally to reflected portion of the inguinal ligament. This was done using a running 0 Prolene suture. Once we got up to the core we actually formed a plug of polypropylene mesh and placed it into the internal ring area and covered it with the mesh Weaver. Some place. It was secured in place with suture of the whole Prolene suture. Once this was done we reapproximated the actual date fascia on top the spermatic cord laterally using running 3-0 Vicryl suture. We irrigated the area with antibiotic solution. We then closed Scarpa's fascia using interrupted 3-0 Vicryl suture. Cortisone Marcaine with epi was injected into the wound all counts were correct. The skin was reapproximated using running subcuticular stitch of 4-0 Monocryl. Dermabond Steri-Strips and Tegaderms use complete the dressing. All counts were correct.  PATIENT DISPOSITION:  PACU - hemodynamically stable.   [name] 11/1/20139:24 AM

## 2012-02-09 NOTE — Transfer of Care (Signed)
Immediate Anesthesia Transfer of Care Note  Patient: [name]  Procedure(s) Performed: Procedure(s) (LRB) with comments: HERNIA REPAIR INGUINAL ADULT (Right) INSERTION OF MESH (Right)  Patient Location: PACU  Anesthesia Type:General  Level of Consciousness: awake, alert  and oriented  Airway & Oxygen Therapy: Patient Spontanous Breathing and Patient connected to nasal cannula oxygen  Post-op Assessment: Report given to PACU RN and Post -op Vital signs reviewed and stable  Post vital signs: Reviewed and stable  Complications: No apparent anesthesia complications and Patient re-intubated

## 2012-02-09 NOTE — H&P (Signed)
Patient ID: [name], male DOB: 12-Sep-1955, 56 y.o. MRN: [id number]  Chief Complaint   Patient presents with   .  New Evaluation     hernia    HPI  [name] is a 56 y.o. male. The patient comes in with a known right inguinal hernia that has become more symptomatic over the last several weeks.  The patient had a previous left inguinal hernia repair by Dr. Jerelene Redden in 1998. At that time he had no problems with his heart but had a massive myocardial infarction in 2006. That time he underwent coronary artery bypass grafting x3. He done well from that surgery until February of 2013 at which time he was noted to be in significant right heart failure. He was found have an ejection fraction between 20-25%. He recently underwent the placement of a AICD/pacemaker by Dr. Graciela Husbands.  The patient has not had any procedures since his AICD/pacemaker had been put in place. The only blood thinner that the patient is taking is aspirin baby aspirin a day. We will stop that at least 5 days prior to surgery.  HPI  Past Medical History   Diagnosis  Date   .  Ischemic cardiomyopathy      EF 36%; prior anterior MI, s/p CABG x 3; s/p cath Feb 2013 showing grafts to be patent with severe LV dysfunction   .  Chronic systolic heart failure      class 2-3   .  Hyperlipidemia    .  Hemochromatosis    .  RBBB (right bundle branch block)    .  Rotator cuff injury    .  Sleep apnea    .  PAF (paroxysmal atrial fibrillation)    .  Anemia    .  AMI anterior wall  2006   .  CHF (congestive heart failure)    .  Angina      "upon exertion"   .  Pneumonia  ~ 2001     "chemical; from smelling chlorox"   .  Exertional dyspnea    .  Type II diabetes mellitus    .  ICD (implantable cardiac defibrillator) in place    .  Pacemaker    .  ICD-Medtronic-CRT  08/24/2011    Past Surgical History   Procedure  Date   .  Cardiac catheterization  03/12/2005     INFERIOR WALL AKINESIA. THERE IS GLOBAL HYPOKINESIA  WITH OVERALL MODERATE TO SEVERE LEFT VENTRICULAR SYSTOLIC DYSFUNCTION. EF 35%   .  Coronary artery bypass graft  2006     Emergent CABG x 3 with LIMA to LAD, SVG to OM, SVG to distal RCA per Dr. Tyrone Sage   .  Rotator cuff repair  ?2000/~ 2007     left; right   .  Cardiac catheterization  Feb 2013     Grafts patent. EF is 20 to 25%.   .  Cardiac defibrillator placement  08/21/11     "pacer/defib"   .  Insert / replace / remove pacemaker     Family History   Problem  Relation  Age of Onset   .  Adopted: Yes    Social History  History   Substance Use Topics   .  Smoking status:  Former Smoker -- 2.0 packs/day for 30 years     Types:  Cigarettes     Quit date:  03/11/2005   .  Smokeless tobacco:  Never Used   .  Alcohol  Use:  1.8 oz/week     3 Cans of beer per week    Allergies   Allergen  Reactions   .  Statins  Other (See Comments)     Myalgias    Current Outpatient Prescriptions   Medication  Sig  Dispense  Refill   .  ALPRAZolam (XANAX) 0.25 MG tablet  Take 0.25 mg by mouth at bedtime as needed. For anxiety     .  aspirin EC 81 MG tablet  Take 81 mg by mouth daily.     .  carvedilol (COREG) 25 MG tablet  Take 12.5 mg in morning and 25 mg in evening  30 tablet  11   .  ezetimibe (ZETIA) 10 MG tablet  Take 1 tablet (10 mg total) by mouth daily.  30 tablet  11   .  fenofibrate (TRICOR) 145 MG tablet  Take 1 tablet (145 mg total) by mouth daily.  30 tablet  11   .  furosemide (LASIX) 20 MG tablet  Take 0.5 tablets (10 mg total) by mouth daily.  30 tablet  6   .  lisinopril (PRINIVIL,ZESTRIL) 10 MG tablet  Take 10 mg by mouth 2 (two) times daily.     .  metFORMIN (GLUCOPHAGE) 500 MG tablet  Take 500 mg by mouth 2 (two) times daily with a meal.     .  nitroGLYCERIN (NITROSTAT) 0.4 MG SL tablet  Place 0.4 mg under the tongue every 5 (five) minutes as needed. For chest pain     .  spironolactone (ALDACTONE) 25 MG tablet  Take 12.5 mg by mouth daily.      Review of Systems  Review  of Systems  Constitutional: Negative.  HENT: Negative.  Eyes: Negative.  Respiratory: Negative.  Cardiovascular: Positive for palpitations. Negative for chest pain and leg swelling.  Gastrointestinal: Negative.  Genitourinary: Negative.  Neurological: Negative.  Hematological: Negative.  Psychiatric/Behavioral: Negative.   Blood pressure 122/80, pulse 80, temperature 96.8 F (36 C), temperature source Temporal, resp. rate 18, height 6' (1.829 m), weight 212 lb 6.4 oz (96.344 kg), SpO2 99.00%.  Physical Exam  Physical Exam  Constitutional: He appears well-developed and well-nourished.  HENT:  Head: Normocephalic and atraumatic.  Eyes: EOM are normal. Pupils are equal, round, and reactive to light.  Neck: Normal range of motion. Neck supple.  Cardiovascular: Normal rate, regular rhythm and normal heart sounds.  Pulmonary/Chest: Effort normal and breath sounds normal.   Abdominal: Soft. Normal appearance. There is no tenderness. A hernia is present. Hernia confirmed positive in the right inguinal area (reducible in standing position).    Genitourinary: Penis normal.  Musculoskeletal: Normal range of motion.  Neurological: He is alert.  Skin: Skin is warm.  Psychiatric: He has a normal mood and affect. His behavior is normal. Judgment and thought content normal.   Data Reviewed  Prior history and physical  Assessment   Reducible RIH, likely direct  Significant medical co-morbidities, cardiac in particular   Plan   Cardiac clearance, then surgery  Risk and benefits have been explained to the patient and her wishes to proceed

## 2012-02-09 NOTE — Progress Notes (Signed)
Telemetry monitor alarming for tachycardia (HR 106-141). Checked apical pulse and rate 92, checked radial pulse for 88. Pt's BP 102/60, HR 70 according to dinamap. Pt asymptomatic, denies chest pain. Dr Janee Morn notified secondary to telemetry alarms. No orders received at this time. Will report to oncoming RN to continue monitoring. Pt instructed to notify RN if any new symptoms develop.

## 2012-02-09 NOTE — Progress Notes (Signed)
Received confirmation from medtronic that aicd functioning properly

## 2012-02-09 NOTE — Anesthesia Preprocedure Evaluation (Addendum)
Anesthesia Evaluation  Patient identified by MRN, date of birth, ID band Patient awake    Reviewed: Allergy & Precautions, H&P , NPO status , Patient's Chart, lab work & pertinent test results, reviewed documented beta blocker date and time   Airway Mallampati: II TM Distance: >3 FB Neck ROM: Full    Dental  (+) Teeth Intact and Dental Advisory Given   Pulmonary shortness of breath, sleep apnea ,  breath sounds clear to auscultation        Cardiovascular + angina + CAD, + Past MI and +CHF + dysrhythmias + pacemaker + Cardiac Defibrillator Rhythm:Regular Rate:Normal     Neuro/Psych negative neurological ROS  negative psych ROS   GI/Hepatic negative GI ROS, Neg liver ROS,   Endo/Other  diabetes, Well Controlled, Type 2  Renal/GU negative Renal ROS     Musculoskeletal negative musculoskeletal ROS (+)   Abdominal   Peds  Hematology negative hematology ROS (+)   Anesthesia Other Findings   Reproductive/Obstetrics negative OB ROS                       Anesthesia Physical Anesthesia Plan  ASA: IV  Anesthesia Plan: General   Post-op Pain Management:    Induction: Intravenous  Airway Management Planned: LMA  Additional Equipment:   Intra-op Plan:   Post-operative Plan: Extubation in OR  Informed Consent:   Plan Discussed with: CRNA and Anesthesiologist  Anesthesia Plan Comments:        Anesthesia Quick Evaluation

## 2012-02-09 NOTE — Anesthesia Postprocedure Evaluation (Signed)
  Anesthesia Post-op Note  Patient: [name]  Procedure(s) Performed: Procedure(s) (LRB) with comments: HERNIA REPAIR INGUINAL ADULT (Right) INSERTION OF MESH (Right)  Patient Location: PACU  Anesthesia Type:General  Level of Consciousness: awake  Airway and Oxygen Therapy: Patient Spontanous Breathing  Post-op Pain: mild  Post-op Assessment: Post-op Vital signs reviewed  Post-op Vital Signs: Reviewed  Complications: No apparent anesthesia complications

## 2012-02-09 NOTE — Progress Notes (Signed)
aicd interegated and sent to [facility]

## 2012-02-10 LAB — GLUCOSE, CAPILLARY: Glucose-Capillary: 161 mg/dL — ABNORMAL HIGH (ref 70–99)

## 2012-02-10 MED ORDER — SODIUM CHLORIDE 0.9 % IV BOLUS (SEPSIS)
250.0000 mL | Freq: Once | INTRAVENOUS | Status: AC
  Administered 2012-02-10: 250 mL via INTRAVENOUS

## 2012-02-10 MED ORDER — OXYCODONE-ACETAMINOPHEN 5-325 MG PO TABS: 1.0000 | ORAL_TABLET | ORAL | Status: DC | PRN

## 2012-02-10 NOTE — Discharge Summary (Signed)
Physician Discharge Summary  Patient ID: [name] MRN: [id number] DOB/AGE: 56/01/1956 56 y.o.  Admit date: 02/09/2012 Discharge date: 02/10/2012  Admission Diagnoses:  Discharge Diagnoses:  Active Problems:  * No active hospital problems. *    Discharged Condition: good  Hospital Course: The patient was admitted after an open right inguinal hernia repair.  He was admitted for observation after being on ASA and having an AICD in place.  He did well post-operatively.  He has been able to void without difficulty.  He is eating well.  His incision is doing well without significant swelling.  Consults: cardiology and AICD team for diagnostics  Significant Diagnostic Studies: None  Treatments: surgery: RIH repair  Discharge Exam: Blood pressure 87/56, pulse 88, temperature 98.2 F (36.8 C), temperature source Oral, resp. rate 18, height 6' (1.829 m), weight 96.571 kg (212 lb 14.4 oz), SpO2 94.00%. General appearance: alert, cooperative, appears stated age and no distress GI: soft, non-tender; bowel sounds normal; no masses,  no organomegaly and Mild ecchymosis of his right groin wound Male genitalia: normal, No tense swelling  Disposition: 01-Home or Self Care  Discharge Orders    Future Appointments: Provider: Department: Dept Phone: Center:   02/23/2012 2:30 PM Cherylynn Ridges, MD [facility] [phone] None   03/11/2012 8:55 AM [facility] [phone] [facility]     Future Orders Please Complete By Expires   Diet - low sodium heart healthy      Increase activity slowly      Lifting restrictions      Comments:   No lifting greater than 20 pounds for the next 6 weeks.   Driving Restrictions      Comments:   One week.   Leave dressing on - Keep it clean, dry, and intact until clinic visit      Call MD for:  extreme fatigue      (HEART FAILURE PATIENTS) Call MD:  Anytime you have any of the following symptoms: 1) 3 pound  weight gain in 24 hours or 5 pounds in 1 week 2) shortness of breath, with or without a dry hacking cough 3) swelling in the hands, feet or stomach 4) if you have to sleep on extra pillows at night in order to breathe.      Call MD for:  persistant dizziness or light-headedness      Call MD for:  hives      Call MD for:  difficulty breathing, headache or visual disturbances      Call MD for:  redness, tenderness, or signs of infection (pain, swelling, redness, odor or green/yellow discharge around incision site)      Call MD for:  severe uncontrolled pain      Call MD for:  persistant nausea and vomiting      Call MD for:  temperature >100.4          Medication List     As of 02/10/2012  8:00 AM    STOP taking these medications         aspirin EC 81 MG tablet      TAKE these medications         carvedilol 25 MG tablet   Commonly known as: COREG   Take 25 mg by mouth 2 (two) times daily with a meal.      ezetimibe 10 MG tablet   Commonly known as: ZETIA   Take 10 mg by mouth daily.  fenofibrate 145 MG tablet   Commonly known as: TRICOR   Take 145 mg by mouth daily.      furosemide 20 MG tablet   Commonly known as: LASIX   Take 10 mg by mouth daily.      lisinopril 10 MG tablet   Commonly known as: PRINIVIL,ZESTRIL   Take 10 mg by mouth 2 (two) times daily.      metFORMIN 500 MG tablet   Commonly known as: GLUCOPHAGE   Take 500 mg by mouth 2 (two) times daily with a meal.      nitroGLYCERIN 0.4 MG SL tablet   Commonly known as: NITROSTAT   Place 0.4 mg under the tongue every 5 (five) minutes as needed. For chest pain      oxyCODONE-acetaminophen 5-325 MG per tablet   Commonly known as: PERCOCET/ROXICET   Take 1-2 tablets by mouth every 4 (four) hours as needed.      RA FISH OIL 1400 MG Cpdr   Take 1,400 mg by mouth 2 (two) times daily.      spironolactone 25 MG tablet   Commonly known as: ALDACTONE   Take 12.5 mg by mouth daily.           Follow-up  Information    Follow up with Roseanne Juenger, Marta Lamas, MD. In 2 weeks.   Contact information:   [address] [place], [place] [address] [phone]          Signed: Cherylynn Ridges 02/10/2012, 8:00 AM

## 2012-02-10 NOTE — Progress Notes (Signed)
0510 Patient's b/p 84/56 manual 0600 recheck b/p was 87/56. Dr. Janee Morn notified of low b/p and patient's cardiac history. Dr. Janee Morn order  Bolus of normal saline. 1610 Normal saline at 222ml/hr started. Will continue to monitor.

## 2012-02-12 ENCOUNTER — Encounter (HOSPITAL_COMMUNITY): Payer: Self-pay | Admitting: General Surgery

## 2012-02-19 ENCOUNTER — Encounter (INDEPENDENT_AMBULATORY_CARE_PROVIDER_SITE_OTHER): Payer: Self-pay | Admitting: General Surgery

## 2012-02-19 ENCOUNTER — Ambulatory Visit (INDEPENDENT_AMBULATORY_CARE_PROVIDER_SITE_OTHER): Payer: BC Managed Care – PPO | Admitting: General Surgery

## 2012-02-19 VITALS — BP 128/72 | HR 87 | Temp 97.9°F | Resp 12 | Ht 72.0 in | Wt 210.4 lb

## 2012-02-19 DIAGNOSIS — Z09 Encounter for follow-up examination after completed treatment for conditions other than malignant neoplasm: Secondary | ICD-10-CM

## 2012-02-19 NOTE — Progress Notes (Signed)
The patient is doing well status post right inguinal hernia repair.  He has a mild to moderate amount of swelling but no significant hematoma. The wound is healed well with no infection. The Steri-Strips and Tegaderm had been removed. He continued to shower and bathe as usual.  To have a lifting for a total of 6 weeks after surgery. He is to return to see me on a when necessary basis.

## 2012-02-23 ENCOUNTER — Encounter (INDEPENDENT_AMBULATORY_CARE_PROVIDER_SITE_OTHER): Payer: BC Managed Care – PPO | Admitting: General Surgery

## 2012-03-11 ENCOUNTER — Encounter: Payer: BC Managed Care – PPO | Admitting: *Deleted

## 2012-03-19 ENCOUNTER — Encounter (INDEPENDENT_AMBULATORY_CARE_PROVIDER_SITE_OTHER): Payer: Self-pay | Admitting: General Surgery

## 2012-03-19 ENCOUNTER — Encounter: Payer: Self-pay | Admitting: *Deleted

## 2012-03-19 ENCOUNTER — Ambulatory Visit (INDEPENDENT_AMBULATORY_CARE_PROVIDER_SITE_OTHER): Payer: BC Managed Care – PPO | Admitting: General Surgery

## 2012-03-19 VITALS — BP 126/66 | HR 81 | Temp 97.8°F | Ht 72.0 in | Wt 211.6 lb

## 2012-03-19 DIAGNOSIS — Z09 Encounter for follow-up examination after completed treatment for conditions other than malignant neoplasm: Secondary | ICD-10-CM

## 2012-03-19 NOTE — Progress Notes (Signed)
The patient is doing well status post right inguinal hernia repair. He has no evidence of infection. He as no hematoma. He is to return to see me on a when necessary basis. The patient has resumed his medications.

## 2012-03-26 ENCOUNTER — Telehealth: Payer: Self-pay | Admitting: Cardiology

## 2012-03-26 NOTE — Telephone Encounter (Signed)
Spoke to patient's wife Rosey Bath appointment scheduled for mother n law Stevphen Meuse 04/01/12 with Dr.Jordan.Records requested from Dr.Aronson's office.

## 2012-03-26 NOTE — Telephone Encounter (Signed)
Pt wife would like to speak with Dawayne Patricia. Specifically regarding medication, she can be reached at [phone]

## 2012-03-26 NOTE — Telephone Encounter (Signed)
Can you call?

## 2012-03-28 ENCOUNTER — Ambulatory Visit (INDEPENDENT_AMBULATORY_CARE_PROVIDER_SITE_OTHER): Payer: BC Managed Care – PPO | Admitting: *Deleted

## 2012-03-28 ENCOUNTER — Encounter: Payer: Self-pay | Admitting: Internal Medicine

## 2012-03-28 DIAGNOSIS — I5022 Chronic systolic (congestive) heart failure: Secondary | ICD-10-CM

## 2012-03-28 DIAGNOSIS — I2589 Other forms of chronic ischemic heart disease: Secondary | ICD-10-CM

## 2012-03-28 DIAGNOSIS — I255 Ischemic cardiomyopathy: Secondary | ICD-10-CM

## 2012-03-28 DIAGNOSIS — Z95810 Presence of automatic (implantable) cardiac defibrillator: Secondary | ICD-10-CM

## 2012-04-05 LAB — REMOTE ICD DEVICE
AL IMPEDENCE ICD: 513 Ohm
ATRIAL PACING ICD: 0.04 pct
BAMS-0001: 170 {beats}/min
CHARGE TIME: 8.988 s
LV LEAD IMPEDENCE ICD: 608 Ohm
PACEART VT: 0
TOT-0001: 1
TOT-0002: 0
TOT-0006: 20130513000000
TZAT-0001ATACH: 1
TZAT-0001ATACH: 2
TZAT-0001ATACH: 3
TZAT-0001FASTVT: 1
TZAT-0001SLOWVT: 1
TZAT-0001SLOWVT: 2
TZAT-0002ATACH: NEGATIVE
TZAT-0002ATACH: NEGATIVE
TZAT-0002FASTVT: NEGATIVE
TZAT-0004SLOWVT: 8
TZAT-0004SLOWVT: 8
TZAT-0005SLOWVT: 88 pct
TZAT-0005SLOWVT: 91 pct
TZAT-0011SLOWVT: 10 ms
TZAT-0012ATACH: 150 ms
TZAT-0012ATACH: 150 ms
TZAT-0012SLOWVT: 170 ms
TZAT-0012SLOWVT: 170 ms
TZAT-0013SLOWVT: 2
TZAT-0013SLOWVT: 2
TZAT-0018ATACH: NEGATIVE
TZAT-0018ATACH: NEGATIVE
TZAT-0018ATACH: NEGATIVE
TZAT-0018SLOWVT: NEGATIVE
TZAT-0018SLOWVT: NEGATIVE
TZAT-0019ATACH: 6 V
TZAT-0019ATACH: 6 V
TZAT-0019FASTVT: 8 V
TZON-0003ATACH: 350 ms
TZON-0003SLOWVT: 300 ms
TZON-0004SLOWVT: 48
TZON-0004VSLOWVT: 52
TZON-0005SLOWVT: 12
TZST-0001ATACH: 4
TZST-0001ATACH: 5
TZST-0001FASTVT: 2
TZST-0001FASTVT: 3
TZST-0001FASTVT: 4
TZST-0001SLOWVT: 3
TZST-0001SLOWVT: 4
TZST-0001SLOWVT: 6
TZST-0002ATACH: NEGATIVE
TZST-0002FASTVT: NEGATIVE
TZST-0002FASTVT: NEGATIVE
TZST-0002FASTVT: NEGATIVE
TZST-0003SLOWVT: 35 J
TZST-0003SLOWVT: 35 J
VENTRICULAR PACING ICD: 99.9 pct

## 2012-04-24 ENCOUNTER — Ambulatory Visit (INDEPENDENT_AMBULATORY_CARE_PROVIDER_SITE_OTHER): Payer: BC Managed Care – PPO | Admitting: *Deleted

## 2012-04-24 DIAGNOSIS — I5022 Chronic systolic (congestive) heart failure: Secondary | ICD-10-CM

## 2012-04-24 DIAGNOSIS — I255 Ischemic cardiomyopathy: Secondary | ICD-10-CM

## 2012-04-24 DIAGNOSIS — I2589 Other forms of chronic ischemic heart disease: Secondary | ICD-10-CM

## 2012-04-24 LAB — ICD DEVICE OBSERVATION
AL IMPEDENCE ICD: 513 Ohm
ATRIAL PACING ICD: 0.2 pct
BRDY-0002LV: 50 {beats}/min
BRDY-0003LV: 130 {beats}/min
LV LEAD THRESHOLD: 3 V
TZAT-0001ATACH: 1
TZAT-0001ATACH: 3
TZAT-0001FASTVT: 1
TZAT-0001SLOWVT: 1
TZAT-0001SLOWVT: 2
TZAT-0002ATACH: NEGATIVE
TZAT-0002ATACH: NEGATIVE
TZAT-0002ATACH: NEGATIVE
TZAT-0002FASTVT: NEGATIVE
TZAT-0005SLOWVT: 88 pct
TZAT-0005SLOWVT: 91 pct
TZAT-0011SLOWVT: 10 ms
TZAT-0012ATACH: 150 ms
TZAT-0013SLOWVT: 2
TZAT-0013SLOWVT: 2
TZAT-0018ATACH: NEGATIVE
TZAT-0018ATACH: NEGATIVE
TZAT-0018SLOWVT: NEGATIVE
TZAT-0018SLOWVT: NEGATIVE
TZAT-0019ATACH: 6 V
TZAT-0019ATACH: 6 V
TZAT-0019FASTVT: 8 V
TZON-0004SLOWVT: 48
TZON-0005SLOWVT: 12
TZST-0001ATACH: 4
TZST-0001ATACH: 5
TZST-0001FASTVT: 3
TZST-0001FASTVT: 4
TZST-0001SLOWVT: 4
TZST-0001SLOWVT: 6
TZST-0002ATACH: NEGATIVE
TZST-0002ATACH: NEGATIVE
TZST-0002FASTVT: NEGATIVE
TZST-0002FASTVT: NEGATIVE
TZST-0002FASTVT: NEGATIVE
TZST-0002FASTVT: NEGATIVE
TZST-0003SLOWVT: 35 J

## 2012-04-24 NOTE — Progress Notes (Signed)
ICD check for LV threshold

## 2012-05-10 ENCOUNTER — Other Ambulatory Visit (INDEPENDENT_AMBULATORY_CARE_PROVIDER_SITE_OTHER): Payer: BC Managed Care – PPO

## 2012-05-10 DIAGNOSIS — I255 Ischemic cardiomyopathy: Secondary | ICD-10-CM

## 2012-05-10 DIAGNOSIS — I2589 Other forms of chronic ischemic heart disease: Secondary | ICD-10-CM

## 2012-05-10 DIAGNOSIS — I2510 Atherosclerotic heart disease of native coronary artery without angina pectoris: Secondary | ICD-10-CM

## 2012-05-10 DIAGNOSIS — I4891 Unspecified atrial fibrillation: Secondary | ICD-10-CM

## 2012-05-10 DIAGNOSIS — I480 Paroxysmal atrial fibrillation: Secondary | ICD-10-CM

## 2012-05-10 DIAGNOSIS — I5022 Chronic systolic (congestive) heart failure: Secondary | ICD-10-CM

## 2012-05-10 DIAGNOSIS — Z95810 Presence of automatic (implantable) cardiac defibrillator: Secondary | ICD-10-CM

## 2012-05-10 LAB — BASIC METABOLIC PANEL
BUN: 21 mg/dL (ref 6–23)
CO2: 23 mEq/L (ref 19–32)
Calcium: 9.2 mg/dL (ref 8.4–10.5)
Creatinine, Ser: 1.5 mg/dL (ref 0.4–1.5)

## 2012-05-10 LAB — HEPATIC FUNCTION PANEL
ALT: 34 U/L (ref 0–53)
AST: 31 U/L (ref 0–37)
Alkaline Phosphatase: 34 U/L — ABNORMAL LOW (ref 39–117)
Bilirubin, Direct: 0.2 mg/dL (ref 0.0–0.3)
Total Bilirubin: 0.9 mg/dL (ref 0.3–1.2)

## 2012-05-10 LAB — LIPID PANEL: Triglycerides: 199 mg/dL — ABNORMAL HIGH (ref 0.0–149.0)

## 2012-05-10 LAB — LDL CHOLESTEROL, DIRECT: Direct LDL: 162.7 mg/dL

## 2012-05-13 ENCOUNTER — Encounter: Payer: Self-pay | Admitting: Internal Medicine

## 2012-05-15 ENCOUNTER — Ambulatory Visit (INDEPENDENT_AMBULATORY_CARE_PROVIDER_SITE_OTHER): Payer: BC Managed Care – PPO | Admitting: Cardiology

## 2012-05-15 ENCOUNTER — Encounter: Payer: Self-pay | Admitting: Cardiology

## 2012-05-15 VITALS — BP 110/64 | HR 82 | Ht 72.0 in | Wt 218.0 lb

## 2012-05-15 DIAGNOSIS — Z95810 Presence of automatic (implantable) cardiac defibrillator: Secondary | ICD-10-CM

## 2012-05-15 DIAGNOSIS — I5022 Chronic systolic (congestive) heart failure: Secondary | ICD-10-CM

## 2012-05-15 DIAGNOSIS — E785 Hyperlipidemia, unspecified: Secondary | ICD-10-CM

## 2012-05-15 DIAGNOSIS — I2510 Atherosclerotic heart disease of native coronary artery without angina pectoris: Secondary | ICD-10-CM

## 2012-05-15 DIAGNOSIS — E119 Type 2 diabetes mellitus without complications: Secondary | ICD-10-CM

## 2012-05-15 NOTE — Progress Notes (Signed)
[name] Date of Birth: 05/13/1955 Medical Record #[id number]  History of Present Illness: [name] is seen back today for a followup visit. He underwent placement of the ICD/CRT on 08/21/2011. Since then he has noted continued improvement in his shortness of breath and energy level. Recently he was noted to have a high threshold on his left ventricular lead and is out the settings were increased. Following this he felt more short of breath with exertion for 3-4 days and then his breathing improved. He states now he can do pretty much what he wants to do. He does get short of breath and he is carrying a heavy weight. He does need to stop more frequently when he exerts himself.  Current Outpatient Prescriptions on File Prior to Visit  Medication Sig Dispense Refill  . carvedilol (COREG) 25 MG tablet Take 25 mg by mouth 2 (two) times daily with a meal.      . ezetimibe (ZETIA) 10 MG tablet Take 10 mg by mouth daily.      . fenofibrate (TRICOR) 145 MG tablet Take 145 mg by mouth daily.      . furosemide (LASIX) 20 MG tablet Take 10 mg by mouth daily.      Marland Kitchen lisinopril (PRINIVIL,ZESTRIL) 10 MG tablet Take 10 mg by mouth daily.       . metFORMIN (GLUCOPHAGE) 500 MG tablet Take 500 mg by mouth 2 (two) times daily with a meal.       . nitroGLYCERIN (NITROSTAT) 0.4 MG SL tablet Place 0.4 mg under the tongue every 5 (five) minutes as needed. For chest pain      . Omega-3 Fatty Acids (RA FISH OIL) 1400 MG CPDR Take 1,400 mg by mouth 2 (two) times daily.      Marland Kitchen spironolactone (ALDACTONE) 25 MG tablet Take 12.5 mg by mouth daily.         Allergies  Allergen Reactions  . Statins Other (See Comments)    Myalgias     Past Medical History  Diagnosis Date  . Ischemic cardiomyopathy     EF 36%; prior anterior MI, s/p CABG x 3; s/p cath Feb 2013 showing grafts to be patent with severe LV dysfunction  . Chronic systolic heart failure     class 2-3  . Hyperlipidemia   . Hemochromatosis   . RBBB  (right bundle branch block)   . Rotator cuff injury   . PAF (paroxysmal atrial fibrillation)   . Anemia   . AMI anterior wall 2006  . CHF (congestive heart failure)   . Angina     "upon exertion"  . Pneumonia ~ 2001    "chemical; from smelling chlorox"  . Exertional dyspnea   . Type II diabetes mellitus   . ICD (implantable cardiac defibrillator) in place   . Pacemaker   . ICD-Medtronic-CRT 08/24/2011  . Sleep apnea     no cpap  . Coronary artery disease     Past Surgical History  Procedure Date  . Cardiac catheterization 03/12/2005    INFERIOR WALL AKINESIA. THERE IS GLOBAL HYPOKINESIA WITH OVERALL MODERATE TO SEVERE LEFT VENTRICULAR SYSTOLIC DYSFUNCTION. EF 35%  . Coronary artery bypass graft 2006    Emergent CABG x 3 with LIMA to LAD, SVG to OM, SVG to distal RCA per Dr. Tyrone Sage  . Rotator cuff repair ?2000/~ 2007    left; right   . Cardiac catheterization Feb 2013    Grafts patent. EF is 20 to 25%.  . Cardiac  defibrillator placement 08/21/11    "pacer/defib"  . Insert / replace / remove pacemaker   . Joint replacement     bil shoulders  . Inguinal hernia repair 02/09/2012    WITH MESH  . Inguinal hernia repair 02/09/2012    Procedure: HERNIA REPAIR INGUINAL ADULT;  Surgeon: Cherylynn Ridges, MD;  Location: [facility] OR;  Service: General;  Laterality: Right;  . Insertion of mesh 02/09/2012    Procedure: INSERTION OF MESH;  Surgeon: Cherylynn Ridges, MD;  Location: [facility] OR;  Service: General;  Laterality: Right;    History  Smoking status  . Former Smoker -- 2.0 packs/day for 30 years  . Types: Cigarettes  . Quit date: 03/11/2005  Smokeless tobacco  . Never Used    History  Alcohol Use  . 1.8 oz/week  . 3 Cans of beer per week    Comment: weekly    Family History  Problem Relation Age of Onset  . Adopted: Yes    Review of Systems: The review of systems is per the HPI.  All other systems were reviewed and are negative.  Physical Exam: BP 110/64  Pulse 82  Ht 6'  (1.829 m)  Wt 218 lb (98.884 kg)  BMI 29.57 kg/m2  SpO2 96% Patient is very pleasant and in no acute distress. Skin is warm and dry. Color is normal.  HEENT is unremarkable. Normocephalic/atraumatic. PERRL. Sclera are nonicteric. Neck is supple. No masses. No JVD. His ICD site has healed well. Lungs are clear. Cardiac exam shows a regular rate and rhythm. Abdomen is soft. Extremities are without edema. Gait and ROM are intact. No gross neurologic deficits noted.    LABORATORY DATA: Lab Results  Component Value Date   CHOL 241* 05/10/2012   HDL 35.30* 05/10/2012   LDLCALC 116* 01/13/2011   LDLDIRECT 162.7 05/10/2012   TRIG 199.0* 05/10/2012   CHOLHDL 7 05/10/2012     Assessment / Plan: 1. Chronic systolic CHF with ejection fraction of 20-25%. Clinically improved with biventricular ICD pacing. Currently class II. He does not appear to be volume overloaded. Continue therapy with Lasix, Aldactone, lisinopril, and carvedilol.  2. Ischemic cardiomyopathy.  3. Coronary disease status post CABG in 2006. Cardiac catheterization in February 2013 showed all grafts were patent.  4. Right bundle branch block.  5. Hyperlipidemia. Patient is intolerant to statins. He is on fenofibrate and Zetia.

## 2012-05-15 NOTE — Patient Instructions (Signed)
Continue your current medication  Decrease your sugar intake.  Keep up with exercise  I will see you again in 4 months.

## 2012-07-01 ENCOUNTER — Encounter: Payer: Self-pay | Admitting: Internal Medicine

## 2012-07-01 ENCOUNTER — Other Ambulatory Visit: Payer: Self-pay

## 2012-07-01 ENCOUNTER — Ambulatory Visit (INDEPENDENT_AMBULATORY_CARE_PROVIDER_SITE_OTHER): Payer: BC Managed Care – PPO | Admitting: *Deleted

## 2012-07-01 DIAGNOSIS — I5022 Chronic systolic (congestive) heart failure: Secondary | ICD-10-CM

## 2012-07-01 DIAGNOSIS — I255 Ischemic cardiomyopathy: Secondary | ICD-10-CM

## 2012-07-01 DIAGNOSIS — I2589 Other forms of chronic ischemic heart disease: Secondary | ICD-10-CM

## 2012-07-01 DIAGNOSIS — Z95810 Presence of automatic (implantable) cardiac defibrillator: Secondary | ICD-10-CM

## 2012-07-10 LAB — REMOTE ICD DEVICE
AL THRESHOLD: 0.625 V
ATRIAL PACING ICD: 0.04 pct
BATTERY VOLTAGE: 3.0401 V
FVT: 0
LV LEAD THRESHOLD: 3.75 V
RV LEAD THRESHOLD: 0.75 V
TZAT-0001ATACH: 1
TZAT-0001ATACH: 2
TZAT-0001SLOWVT: 1
TZAT-0001SLOWVT: 2
TZAT-0002ATACH: NEGATIVE
TZAT-0002ATACH: NEGATIVE
TZAT-0002FASTVT: NEGATIVE
TZAT-0004SLOWVT: 8
TZAT-0004SLOWVT: 8
TZAT-0005SLOWVT: 88 pct
TZAT-0005SLOWVT: 91 pct
TZAT-0011SLOWVT: 10 ms
TZAT-0011SLOWVT: 10 ms
TZAT-0018ATACH: NEGATIVE
TZAT-0018ATACH: NEGATIVE
TZAT-0018ATACH: NEGATIVE
TZAT-0018FASTVT: NEGATIVE
TZAT-0019ATACH: 6 V
TZAT-0019ATACH: 6 V
TZAT-0019FASTVT: 8 V
TZAT-0020FASTVT: 1.5 ms
TZON-0004VSLOWVT: 52
TZON-0005SLOWVT: 12
TZST-0001FASTVT: 3
TZST-0001FASTVT: 5
TZST-0001FASTVT: 6
TZST-0001SLOWVT: 3
TZST-0001SLOWVT: 4
TZST-0001SLOWVT: 6
TZST-0002ATACH: NEGATIVE
TZST-0002FASTVT: NEGATIVE
TZST-0002FASTVT: NEGATIVE
TZST-0002FASTVT: NEGATIVE
TZST-0003SLOWVT: 25 J
TZST-0003SLOWVT: 35 J
VENTRICULAR PACING ICD: 99.89 pct
VF: 0

## 2012-07-31 ENCOUNTER — Ambulatory Visit (INDEPENDENT_AMBULATORY_CARE_PROVIDER_SITE_OTHER): Payer: BC Managed Care – PPO | Admitting: *Deleted

## 2012-07-31 ENCOUNTER — Other Ambulatory Visit: Payer: Self-pay

## 2012-07-31 ENCOUNTER — Encounter: Payer: Self-pay | Admitting: Internal Medicine

## 2012-07-31 DIAGNOSIS — I255 Ischemic cardiomyopathy: Secondary | ICD-10-CM

## 2012-07-31 DIAGNOSIS — I2589 Other forms of chronic ischemic heart disease: Secondary | ICD-10-CM

## 2012-07-31 DIAGNOSIS — I5022 Chronic systolic (congestive) heart failure: Secondary | ICD-10-CM

## 2012-07-31 LAB — ICD DEVICE OBSERVATION
ATRIAL PACING ICD: 0.2 pct
BAMS-0001: 170 {beats}/min
BATTERY VOLTAGE: 3.02 V
TZAT-0001ATACH: 1
TZAT-0001ATACH: 2
TZAT-0001SLOWVT: 1
TZAT-0001SLOWVT: 2
TZAT-0002ATACH: NEGATIVE
TZAT-0002ATACH: NEGATIVE
TZAT-0004SLOWVT: 8
TZAT-0004SLOWVT: 8
TZAT-0005SLOWVT: 88 pct
TZAT-0005SLOWVT: 91 pct
TZAT-0012ATACH: 150 ms
TZAT-0012ATACH: 150 ms
TZAT-0013SLOWVT: 2
TZAT-0013SLOWVT: 2
TZAT-0018ATACH: NEGATIVE
TZAT-0018ATACH: NEGATIVE
TZAT-0018FASTVT: NEGATIVE
TZAT-0019ATACH: 6 V
TZAT-0019FASTVT: 8 V
TZAT-0020ATACH: 1.5 ms
TZAT-0020FASTVT: 1.5 ms
TZON-0003ATACH: 350 ms
TZON-0003SLOWVT: 300 ms
TZON-0004SLOWVT: 48
TZON-0004VSLOWVT: 52
TZST-0001ATACH: 4
TZST-0001FASTVT: 2
TZST-0001FASTVT: 6
TZST-0001SLOWVT: 3
TZST-0001SLOWVT: 6
TZST-0002ATACH: NEGATIVE
TZST-0002ATACH: NEGATIVE
TZST-0002FASTVT: NEGATIVE
TZST-0002FASTVT: NEGATIVE
TZST-0002FASTVT: NEGATIVE
TZST-0003SLOWVT: 25 J
TZST-0003SLOWVT: 35 J
VENTRICULAR PACING ICD: 99.9 pct

## 2012-07-31 NOTE — Progress Notes (Signed)
LV output reprogrammed

## 2012-08-02 ENCOUNTER — Other Ambulatory Visit: Payer: Self-pay | Admitting: Nurse Practitioner

## 2012-08-07 ENCOUNTER — Encounter: Payer: BC Managed Care – PPO | Admitting: Internal Medicine

## 2012-08-28 ENCOUNTER — Other Ambulatory Visit: Payer: Self-pay

## 2012-08-28 MED ORDER — FUROSEMIDE 20 MG PO TABS: 10.0000 mg | ORAL_TABLET | Freq: Every day | ORAL | Status: DC

## 2012-08-28 NOTE — Telephone Encounter (Signed)
furosemide (LASIX) 20 MG tablet  Take 10 mg by mouth daily.    Patient Instructions    Continue your current medication  Decrease your sugar intake.  Keep up with exercise  I will see you again in 4 months.   Chart Reviewed By    Charna Elizabeth, LPN  on 04/15/1094  8:35 AM        Previous Visit      Provider Department Encounter #    05/10/2012  8:00 AM Peter [place], MD [facility] [place] [id number]

## 2012-09-06 ENCOUNTER — Encounter: Payer: Self-pay | Admitting: Cardiology

## 2012-09-06 ENCOUNTER — Encounter: Payer: BC Managed Care – PPO | Admitting: Internal Medicine

## 2012-09-06 ENCOUNTER — Encounter: Payer: Self-pay | Admitting: Internal Medicine

## 2012-09-06 ENCOUNTER — Ambulatory Visit (INDEPENDENT_AMBULATORY_CARE_PROVIDER_SITE_OTHER): Payer: BC Managed Care – PPO | Admitting: Cardiology

## 2012-09-06 VITALS — BP 102/65 | HR 75 | Ht 72.0 in | Wt 218.0 lb

## 2012-09-06 DIAGNOSIS — I480 Paroxysmal atrial fibrillation: Secondary | ICD-10-CM

## 2012-09-06 DIAGNOSIS — Z95810 Presence of automatic (implantable) cardiac defibrillator: Secondary | ICD-10-CM

## 2012-09-06 DIAGNOSIS — I255 Ischemic cardiomyopathy: Secondary | ICD-10-CM

## 2012-09-06 DIAGNOSIS — I2589 Other forms of chronic ischemic heart disease: Secondary | ICD-10-CM

## 2012-09-06 DIAGNOSIS — I5022 Chronic systolic (congestive) heart failure: Secondary | ICD-10-CM

## 2012-09-06 DIAGNOSIS — I4891 Unspecified atrial fibrillation: Secondary | ICD-10-CM

## 2012-09-06 DIAGNOSIS — I10 Essential (primary) hypertension: Secondary | ICD-10-CM

## 2012-09-06 DIAGNOSIS — I2510 Atherosclerotic heart disease of native coronary artery without angina pectoris: Secondary | ICD-10-CM

## 2012-09-06 NOTE — Patient Instructions (Addendum)
Continue same medication  Remote Device Check 12/10/12  Your physician wants you to follow-up in: 6 months with Dr.Klein. You will receive a reminder letter in the mail two months in advance. If you don't receive a letter, please call our office to schedule the follow-up appointment.

## 2012-09-09 ENCOUNTER — Other Ambulatory Visit: Payer: Self-pay | Admitting: Cardiology

## 2012-09-10 ENCOUNTER — Encounter: Payer: Self-pay | Admitting: Cardiology

## 2012-09-10 LAB — ICD DEVICE OBSERVATION
AL AMPLITUDE: 1.3 mv
BAMS-0001: 170 {beats}/min
FVT: 0
HV IMPEDENCE: 61 Ohm
PACEART VT: 0
RV LEAD IMPEDENCE ICD: 589 Ohm
RV LEAD THRESHOLD: 0.75 V
RV LEAD THRESHOLD: 3.25 V
TZAT-0001ATACH: 1
TZAT-0002ATACH: NEGATIVE
TZAT-0002ATACH: NEGATIVE
TZAT-0005SLOWVT: 88 pct
TZAT-0005SLOWVT: 91 pct
TZAT-0011SLOWVT: 10 ms
TZAT-0011SLOWVT: 10 ms
TZAT-0012ATACH: 150 ms
TZAT-0012SLOWVT: 170 ms
TZAT-0012SLOWVT: 170 ms
TZAT-0018ATACH: NEGATIVE
TZAT-0018SLOWVT: NEGATIVE
TZAT-0018SLOWVT: NEGATIVE
TZAT-0019ATACH: 6 V
TZAT-0019ATACH: 6 V
TZAT-0019ATACH: 6 V
TZAT-0019FASTVT: 8 V
TZAT-0019SLOWVT: 8 V
TZAT-0019SLOWVT: 8 V
TZAT-0020ATACH: 1.5 ms
TZAT-0020FASTVT: 1.5 ms
TZON-0003ATACH: 350 ms
TZON-0003SLOWVT: 300 ms
TZON-0005SLOWVT: 12
TZST-0001ATACH: 4
TZST-0001ATACH: 5
TZST-0001FASTVT: 2
TZST-0001FASTVT: 3
TZST-0001FASTVT: 5
TZST-0001SLOWVT: 4
TZST-0001SLOWVT: 6
TZST-0002FASTVT: NEGATIVE
TZST-0002FASTVT: NEGATIVE
TZST-0002FASTVT: NEGATIVE
TZST-0003SLOWVT: 25 J
TZST-0003SLOWVT: 35 J
TZST-0003SLOWVT: 35 J
VF: 0

## 2012-09-10 NOTE — Progress Notes (Signed)
ELECTROPHYSIOLOGY OFFICE NOTE  Patient ID: [name] MRN: [id number], DOB/AGE: 1955-12-23   Date of Visit: 09/10/2012  Primary Physician: Minda Meo, MD Primary Cardiologist / EP: [place], MD / Graciela Husbands, MD Reason for Visit: EP/device follow-up  History of Present Illness  [name] is a pleasant 57 year old man with an ischemic CM, chronic systolic HF s/p CRT-D, CAD and HTN who presents today for routine electrophysiology followup. Since last being seen in our clinic, he reports he is doing well. He has no complaints. Today, he denies chest pain or shortness of breath. He denies palpitations, dizziness, near syncope or syncope. He denies LE swelling, orthopnea, PND or recent weight gain. Mr. Dubie reports that he is compliant and tolerating medications without difficulty.  Past Medical History Past Medical History  Diagnosis Date  . Ischemic cardiomyopathy     EF 36%; prior anterior MI, s/p CABG x 3; s/p cath Feb 2013 showing grafts to be patent with severe LV dysfunction  . Chronic systolic heart failure     class 2-3  . Hyperlipidemia   . Hemochromatosis   . RBBB (right bundle branch block)   . Rotator cuff injury   . PAF (paroxysmal atrial fibrillation)   . Anemia   . AMI anterior wall 2006  . CHF (congestive heart failure)   . Angina     "upon exertion"  . Pneumonia ~ 2001    "chemical; from smelling chlorox"  . Exertional dyspnea   . Type II diabetes mellitus   . ICD (implantable cardiac defibrillator) in place   . Pacemaker   . ICD-Medtronic-CRT 08/24/2011  . Sleep apnea     no cpap  . Coronary artery disease     Past Surgical History Past Surgical History  Procedure Laterality Date  . Cardiac catheterization  03/12/2005    INFERIOR WALL AKINESIA. THERE IS GLOBAL HYPOKINESIA WITH OVERALL MODERATE TO SEVERE LEFT VENTRICULAR SYSTOLIC DYSFUNCTION. EF 35%  . Coronary artery bypass graft  2006    Emergent CABG x 3 with LIMA to LAD, SVG to OM, SVG to  distal RCA per Dr. Tyrone Sage  . Rotator cuff repair  ?2000/~ 2007    left; right   . Cardiac catheterization  Feb 2013    Grafts patent. EF is 20 to 25%.  . Cardiac defibrillator placement  08/21/11    "pacer/defib"  . Insert / replace / remove pacemaker    . Joint replacement      bil shoulders  . Inguinal hernia repair  02/09/2012    WITH MESH  . Inguinal hernia repair  02/09/2012    Procedure: HERNIA REPAIR INGUINAL ADULT;  Surgeon: Cherylynn Ridges, MD;  Location: [facility] OR;  Service: General;  Laterality: Right;  . Insertion of mesh  02/09/2012    Procedure: INSERTION OF MESH;  Surgeon: Cherylynn Ridges, MD;  Location: [facility] OR;  Service: General;  Laterality: Right;    Allergies/Intolerances Allergies  Allergen Reactions  . Statins Other (See Comments)    Myalgias    Current Home Medications Current Outpatient Prescriptions  Medication Sig Dispense Refill  . aspirin 81 MG tablet Take 81 mg by mouth daily.      . carvedilol (COREG) 25 MG tablet Take 25 mg by mouth 2 (two) times daily with a meal.      . ezetimibe (ZETIA) 10 MG tablet Take 10 mg by mouth daily.      . fenofibrate (TRICOR) 145 MG tablet Take 145 mg by  mouth daily.      . furosemide (LASIX) 20 MG tablet Take 0.5 tablets (10 mg total) by mouth daily.  30 tablet  11  . lisinopril (PRINIVIL,ZESTRIL) 10 MG tablet Take 10 mg by mouth daily.       . metFORMIN (GLUCOPHAGE) 500 MG tablet Take 500 mg by mouth 2 (two) times daily with a meal.       . nitroGLYCERIN (NITROSTAT) 0.4 MG SL tablet Place 0.4 mg under the tongue every 5 (five) minutes as needed. For chest pain      . Omega-3 Fatty Acids (RA FISH OIL) 1400 MG CPDR Take 1,400 mg by mouth 2 (two) times daily.      Marland Kitchen spironolactone (ALDACTONE) 25 MG tablet Take 12.5 mg by mouth daily.        No current facility-administered medications for this visit.   Social History Social History  . Marital Status: Married   Social History Main Topics  . Smoking status: Former Smoker --  2.00 packs/day for 30 years    Types: Cigarettes    Quit date: 03/11/2005  . Smokeless tobacco: Never Used  . Alcohol Use: 1.8 oz/week    3 Cans of beer per week     Comment: weekly  . Drug Use: No   Review of Systems General: No chills, fever, night sweats or weight changes Cardiovascular: No chest pain, dyspnea on exertion, edema, orthopnea, palpitations, paroxysmal nocturnal dyspnea Dermatological: No rash, lesions or masses Respiratory: No cough, dyspnea Urologic: No hematuria, dysuria Abdominal: No nausea, vomiting, diarrhea, bright red blood per rectum, melena, or hematemesis Neurologic: No visual changes, weakness, changes in mental status All other systems reviewed and are otherwise negative except as noted above.  Physical Exam Blood pressure 102/65, pulse 75, height 6' (1.829 m), weight 218 lb (98.884 kg).  General: Well developed, well appearing 57 year old male in no acute distress. HEENT: Normocephalic, atraumatic. EOMs intact. Sclera nonicteric. Oropharynx clear.  Neck: Supple without bruits. No JVD. Lungs: Respirations regular and unlabored, CTA bilaterally. No wheezes, rales or rhonchi. Heart: RRR. S1, S2 present. No murmurs, rub, S3 or S4. Abdomen: Soft, non-distended.  Extremities: No clubbing, cyanosis or edema. PT/Radials 2+ and equal bilaterally. Psych: Normal affect. Neuro: Alert and oriented X 3. Moves all extremities spontaneously.   Diagnostics Device interrogation today - Thresholds and sensing consistent with previous device measurements. Lead impedance trends stable over time. No mode switch episodes recorded. No ventricular arrhythmia episodes recorded. Patient bi-ventricularly pacing >99% of the time. Device programmed with appropriate safety margins. Heart failure diagnostics reviewed and trends are stable for patient. Audible/vibratory alerts demonstrated for patient. No changes made this session.  Assessment and Plan 1. Ischemic CM s/p CRT-D  implant Normal device function No episodes No programming changes made Continue routine remote device checks every 3 months Return to clinic for follow-up with me or Dr. Graciela Husbands in 6 months 2. Chronic systolic HF Stable; euvolemic by exam today OptiVol reviewed and stable Continue medical therapy with carvedilol and ACEI in addition to daily weights and low sodium diet 3. CAD Stable without anginal symptoms Continue medical therapy 4. PAF Stable without recurrence 5. HTN Normotensive today Continue current antihypertensive regimen  Signed, Rick Duff, PA-C 09/10/2012, 9:49 AM

## 2012-09-10 NOTE — Telephone Encounter (Signed)
lisinopril (PRINIVIL,ZESTRIL) 10 MG tablet  Take 10 mg by mouth daily.    Patient Instructions  Continue your current medication Decrease your sugar intake. Keep up with exercise I will see you again in 4 months. Chart Reviewed By  Charna Elizabeth, LPN  on 07/13/4096  8:35 AM

## 2012-09-18 ENCOUNTER — Ambulatory Visit (INDEPENDENT_AMBULATORY_CARE_PROVIDER_SITE_OTHER): Payer: BC Managed Care – PPO | Admitting: Cardiology

## 2012-09-18 ENCOUNTER — Encounter: Payer: Self-pay | Admitting: Cardiology

## 2012-09-18 VITALS — BP 110/62 | HR 80 | Ht 72.0 in | Wt 219.2 lb

## 2012-09-18 DIAGNOSIS — I4510 Unspecified right bundle-branch block: Secondary | ICD-10-CM

## 2012-09-18 DIAGNOSIS — I5022 Chronic systolic (congestive) heart failure: Secondary | ICD-10-CM

## 2012-09-18 DIAGNOSIS — I2589 Other forms of chronic ischemic heart disease: Secondary | ICD-10-CM

## 2012-09-18 DIAGNOSIS — I255 Ischemic cardiomyopathy: Secondary | ICD-10-CM

## 2012-09-18 DIAGNOSIS — E785 Hyperlipidemia, unspecified: Secondary | ICD-10-CM

## 2012-09-18 DIAGNOSIS — I2510 Atherosclerotic heart disease of native coronary artery without angina pectoris: Secondary | ICD-10-CM

## 2012-09-18 NOTE — Progress Notes (Signed)
[name] Date of Birth: 31-May-1955 Medical Record #[id number]  History of Present Illness: [name] is seen back today for a followup visit. He underwent placement of the ICD/CRT on 08/21/2011. Since then he has noted continued improvement in his shortness of breath and energy level. He states that his breathing is doing very well and he only get short of breath when he really pushes his exertion. He has had no ICD therapies. His last ICD check was stable. His weight has been stable and he has had no edema. He did have a significant improvement in his ejection fraction post ICD/CRT therapy. By echocardiogram in March 2013 his ejection fraction was 45-50%.  Current Outpatient Prescriptions on File Prior to Visit  Medication Sig Dispense Refill  . aspirin 81 MG tablet Take 81 mg by mouth daily.      . carvedilol (COREG) 25 MG tablet Take 25 mg by mouth as directed. Takes 12.5 mg am,25 mg pm      . ezetimibe (ZETIA) 10 MG tablet Take 10 mg by mouth daily.      . furosemide (LASIX) 20 MG tablet Take 0.5 tablets (10 mg total) by mouth daily.  30 tablet  11  . metFORMIN (GLUCOPHAGE) 500 MG tablet Take 500 mg by mouth 2 (two) times daily with a meal.       . nitroGLYCERIN (NITROSTAT) 0.4 MG SL tablet Place 0.4 mg under the tongue every 5 (five) minutes as needed. For chest pain      . Omega-3 Fatty Acids (RA FISH OIL) 1400 MG CPDR Take 1,400 mg by mouth 2 (two) times daily.      Marland Kitchen spironolactone (ALDACTONE) 25 MG tablet Take 12.5 mg by mouth daily.       . fenofibrate (TRICOR) 145 MG tablet Take 145 mg by mouth daily.      Marland Kitchen lisinopril (PRINIVIL,ZESTRIL) 20 MG tablet Take 0.5 tablets (10 mg total) by mouth daily.  30 tablet  6   No current facility-administered medications on file prior to visit.    Allergies  Allergen Reactions  . Statins Other (See Comments)    Myalgias     Past Medical History  Diagnosis Date  . Ischemic cardiomyopathy     EF 36%; prior anterior MI, s/p CABG x 3;  s/p cath Feb 2013 showing grafts to be patent with severe LV dysfunction  . Chronic systolic heart failure     class 2-3  . Hyperlipidemia   . Hemochromatosis   . RBBB (right bundle branch block)   . Rotator cuff injury   . PAF (paroxysmal atrial fibrillation)   . Anemia   . AMI anterior wall 2006  . CHF (congestive heart failure)   . Angina     "upon exertion"  . Pneumonia ~ 2001    "chemical; from smelling chlorox"  . Exertional dyspnea   . Type II diabetes mellitus   . ICD (implantable cardiac defibrillator) in place   . Pacemaker   . ICD-Medtronic-CRT 08/24/2011  . Sleep apnea     no cpap  . Coronary artery disease     Past Surgical History  Procedure Laterality Date  . Cardiac catheterization  03/12/2005    INFERIOR WALL AKINESIA. THERE IS GLOBAL HYPOKINESIA WITH OVERALL MODERATE TO SEVERE LEFT VENTRICULAR SYSTOLIC DYSFUNCTION. EF 35%  . Coronary artery bypass graft  2006    Emergent CABG x 3 with LIMA to LAD, SVG to OM, SVG to distal RCA per Dr. Tyrone Sage  .  Rotator cuff repair  ?2000/~ 2007    left; right   . Cardiac catheterization  Feb 2013    Grafts patent. EF is 20 to 25%.  . Cardiac defibrillator placement  08/21/11    "pacer/defib"  . Insert / replace / remove pacemaker    . Joint replacement      bil shoulders  . Inguinal hernia repair  02/09/2012    WITH MESH  . Inguinal hernia repair  02/09/2012    Procedure: HERNIA REPAIR INGUINAL ADULT;  Surgeon: Cherylynn Ridges, MD;  Location: [facility] OR;  Service: General;  Laterality: Right;  . Insertion of mesh  02/09/2012    Procedure: INSERTION OF MESH;  Surgeon: Cherylynn Ridges, MD;  Location: [facility] OR;  Service: General;  Laterality: Right;    History  Smoking status  . Former Smoker -- 2.00 packs/day for 30 years  . Types: Cigarettes  . Quit date: 03/11/2005  Smokeless tobacco  . Never Used    History  Alcohol Use  . 1.8 oz/week  . 3 Cans of beer per week    Comment: weekly    Family History  Problem Relation  Age of Onset  . Adopted: Yes    Review of Systems: The review of systems is per the HPI.  All other systems were reviewed and are negative.  Physical Exam: BP 110/62  Pulse 80  Ht 6' (1.829 m)  Wt 219 lb 4 oz (99.451 kg)  BMI 29.73 kg/m2 Patient is very pleasant and in no acute distress. Skin is warm and dry. Color is normal.  HEENT is unremarkable. Normocephalic/atraumatic. PERRL. Sclera are nonicteric. Neck is supple. No masses. No JVD. His ICD site in his left chest is stable. Lungs are clear. Cardiac exam shows a regular rate and rhythm. Abdomen is soft. Extremities are without edema. Gait and ROM are intact. No gross neurologic deficits noted.    LABORATORY DATA: Lab Results  Component Value Date   CHOL 241* 05/10/2012   HDL 35.30* 05/10/2012   LDLCALC 116* 01/13/2011   LDLDIRECT 162.7 05/10/2012   TRIG 199.0* 05/10/2012   CHOLHDL 7 05/10/2012     Assessment / Plan: 1. Chronic systolic CHF with ejection fraction of 45-50 % post CRT. Clinically improved with biventricular ICD pacing. Currently class I- II. He does not appear to be volume overloaded. Continue therapy with Lasix, Aldactone, lisinopril, and carvedilol.  2. Ischemic cardiomyopathy.  3. Coronary disease status post CABG in 2006. Cardiac catheterization in February 2013 showed all grafts were patent.  4. Right bundle branch block.  5. Hyperlipidemia. Patient is intolerant to statins. He is on fenofibrate and Zetia. Will follow up in 6 months with fasting labs.

## 2012-09-27 ENCOUNTER — Other Ambulatory Visit: Payer: Self-pay | Admitting: Cardiology

## 2012-09-27 NOTE — Telephone Encounter (Signed)
fenofibrate (TRICOR) 145 MG tablet  Take 145 mg by mouth daily.

## 2012-10-14 ENCOUNTER — Other Ambulatory Visit: Payer: Self-pay | Admitting: Nurse Practitioner

## 2012-12-03 ENCOUNTER — Other Ambulatory Visit: Payer: Self-pay | Admitting: Internal Medicine

## 2012-12-10 ENCOUNTER — Encounter: Payer: BC Managed Care – PPO | Admitting: *Deleted

## 2012-12-18 ENCOUNTER — Encounter: Payer: Self-pay | Admitting: *Deleted

## 2012-12-23 ENCOUNTER — Ambulatory Visit (INDEPENDENT_AMBULATORY_CARE_PROVIDER_SITE_OTHER): Payer: BC Managed Care – PPO | Admitting: *Deleted

## 2012-12-23 DIAGNOSIS — I255 Ischemic cardiomyopathy: Secondary | ICD-10-CM

## 2012-12-23 DIAGNOSIS — Z95810 Presence of automatic (implantable) cardiac defibrillator: Secondary | ICD-10-CM

## 2012-12-23 DIAGNOSIS — I5022 Chronic systolic (congestive) heart failure: Secondary | ICD-10-CM

## 2012-12-23 DIAGNOSIS — I2589 Other forms of chronic ischemic heart disease: Secondary | ICD-10-CM

## 2012-12-25 LAB — REMOTE ICD DEVICE
AL AMPLITUDE: 2.3 mv
ATRIAL PACING ICD: 0.05 pct
FVT: 0
HV IMPEDENCE: 72 Ohm
LV LEAD THRESHOLD: 3.5 V
RV LEAD AMPLITUDE: 20 mv
RV LEAD IMPEDENCE ICD: 608 Ohm
RV LEAD THRESHOLD: 0.625 V
TZAT-0001ATACH: 2
TZAT-0002ATACH: NEGATIVE
TZAT-0002ATACH: NEGATIVE
TZAT-0005SLOWVT: 88 pct
TZAT-0011SLOWVT: 10 ms
TZAT-0011SLOWVT: 10 ms
TZAT-0012ATACH: 150 ms
TZAT-0012FASTVT: 170 ms
TZAT-0018ATACH: NEGATIVE
TZAT-0019ATACH: 6 V
TZAT-0019FASTVT: 8 V
TZAT-0019SLOWVT: 8 V
TZAT-0019SLOWVT: 8 V
TZAT-0020ATACH: 1.5 ms
TZAT-0020FASTVT: 1.5 ms
TZON-0003VSLOWVT: 400 ms
TZON-0004VSLOWVT: 52
TZST-0001ATACH: 4
TZST-0001ATACH: 5
TZST-0001ATACH: 6
TZST-0001FASTVT: 5
TZST-0001SLOWVT: 3
TZST-0001SLOWVT: 4
TZST-0001SLOWVT: 5
TZST-0002ATACH: NEGATIVE
TZST-0002ATACH: NEGATIVE
TZST-0002FASTVT: NEGATIVE
TZST-0002FASTVT: NEGATIVE
TZST-0003SLOWVT: 25 J
TZST-0003SLOWVT: 35 J
VF: 0

## 2013-01-29 ENCOUNTER — Encounter: Payer: Self-pay | Admitting: Internal Medicine

## 2013-01-31 ENCOUNTER — Other Ambulatory Visit: Payer: Self-pay | Admitting: Cardiology

## 2013-02-11 ENCOUNTER — Ambulatory Visit (INDEPENDENT_AMBULATORY_CARE_PROVIDER_SITE_OTHER): Payer: BC Managed Care – PPO | Admitting: Internal Medicine

## 2013-02-11 ENCOUNTER — Encounter: Payer: Self-pay | Admitting: Internal Medicine

## 2013-02-11 VITALS — BP 115/62 | HR 70

## 2013-02-11 DIAGNOSIS — I5022 Chronic systolic (congestive) heart failure: Secondary | ICD-10-CM

## 2013-02-11 DIAGNOSIS — I2589 Other forms of chronic ischemic heart disease: Secondary | ICD-10-CM

## 2013-02-11 DIAGNOSIS — I4891 Unspecified atrial fibrillation: Secondary | ICD-10-CM

## 2013-02-11 DIAGNOSIS — I255 Ischemic cardiomyopathy: Secondary | ICD-10-CM

## 2013-02-11 LAB — ICD DEVICE OBSERVATION
AL THRESHOLD: 0.75 V
BAMS-0001: 170 {beats}/min
BATTERY VOLTAGE: 2.9514 V
CHARGE TIME: 9.769 s
FVT: 0
HV IMPEDENCE: 57 Ohm
LV LEAD THRESHOLD: 0.5 V
PACEART VT: 0
RV LEAD AMPLITUDE: 11.625 mv
RV LEAD THRESHOLD: 0.75 V
TOT-0001: 1
TOT-0002: 0
TZAT-0001ATACH: 2
TZAT-0001SLOWVT: 2
TZAT-0002ATACH: NEGATIVE
TZAT-0004SLOWVT: 8
TZAT-0004SLOWVT: 8
TZAT-0011SLOWVT: 10 ms
TZAT-0012ATACH: 150 ms
TZAT-0012ATACH: 150 ms
TZAT-0012FASTVT: 170 ms
TZAT-0012SLOWVT: 170 ms
TZAT-0012SLOWVT: 170 ms
TZAT-0018ATACH: NEGATIVE
TZAT-0018ATACH: NEGATIVE
TZAT-0018FASTVT: NEGATIVE
TZAT-0018SLOWVT: NEGATIVE
TZAT-0019ATACH: 6 V
TZAT-0019FASTVT: 8 V
TZAT-0019SLOWVT: 8 V
TZAT-0019SLOWVT: 8 V
TZAT-0020ATACH: 1.5 ms
TZAT-0020ATACH: 1.5 ms
TZAT-0020ATACH: 1.5 ms
TZAT-0020FASTVT: 1.5 ms
TZAT-0020SLOWVT: 1.5 ms
TZAT-0020SLOWVT: 1.5 ms
TZON-0003ATACH: 350 ms
TZON-0003SLOWVT: 300 ms
TZON-0003VSLOWVT: 400 ms
TZON-0004VSLOWVT: 52
TZST-0001ATACH: 4
TZST-0001ATACH: 6
TZST-0001FASTVT: 2
TZST-0001FASTVT: 5
TZST-0001FASTVT: 6
TZST-0001SLOWVT: 5
TZST-0002ATACH: NEGATIVE
TZST-0002ATACH: NEGATIVE
TZST-0002FASTVT: NEGATIVE
TZST-0002FASTVT: NEGATIVE
TZST-0002FASTVT: NEGATIVE
TZST-0003SLOWVT: 25 J
TZST-0003SLOWVT: 35 J
VF: 0

## 2013-02-11 LAB — BASIC METABOLIC PANEL
BUN: 14 mg/dL (ref 6–23)
Chloride: 103 mEq/L (ref 96–112)
Potassium: 4.6 mEq/L (ref 3.5–5.1)
Sodium: 137 mEq/L (ref 135–145)

## 2013-02-11 NOTE — Progress Notes (Signed)
Patient Care Team: Minda Meo, MD as PCP - General (Internal Medicine)   HPI  [name] is a 57 y.o. male Seen in followup for an CRT-D implanted for primary prevention April 2013 for ischemic cardiomyopathy and prior myocardial infarction and right bundle branch block.   He is modestly improved following CRT. He is less winded. He still develops fatigue.  He has daytime somnolence and obstructive sleep apnea. He's had a sleep study but is not inclined to CPAP therapy.  He's had no chest pain edema. There've been no intercurrent ICD discharges   Past Medical History  Diagnosis Date  . Ischemic cardiomyopathy     EF 36%; prior anterior MI, s/p CABG x 3; s/p cath Feb 2013 showing grafts to be patent with severe LV dysfunction  . Chronic systolic heart failure     class 2-3  . Hyperlipidemia   . Hemochromatosis   . RBBB (right bundle branch block)   . Rotator cuff injury   . PAF (paroxysmal atrial fibrillation)   . Anemia   . AMI anterior wall 2006  . CHF (congestive heart failure)   . Angina     "upon exertion"  . Pneumonia ~ 2001    "chemical; from smelling chlorox"  . Exertional dyspnea   . Type II diabetes mellitus   . ICD (implantable cardiac defibrillator) in place   . Pacemaker   . ICD-Medtronic-CRT 08/24/2011  . Sleep apnea     no cpap  . Coronary artery disease     Past Surgical History  Procedure Laterality Date  . Cardiac catheterization  03/12/2005    INFERIOR WALL AKINESIA. THERE IS GLOBAL HYPOKINESIA WITH OVERALL MODERATE TO SEVERE LEFT VENTRICULAR SYSTOLIC DYSFUNCTION. EF 35%  . Coronary artery bypass graft  2006    Emergent CABG x 3 with LIMA to LAD, SVG to OM, SVG to distal RCA per Dr. Tyrone Sage  . Rotator cuff repair  ?2000/~ 2007    left; right   . Cardiac catheterization  Feb 2013    Grafts patent. EF is 20 to 25%.  . Cardiac defibrillator placement  08/21/11    "pacer/defib"  . Insert / replace / remove pacemaker    . Joint  replacement      bil shoulders  . Inguinal hernia repair  02/09/2012    WITH MESH  . Inguinal hernia repair  02/09/2012    Procedure: HERNIA REPAIR INGUINAL ADULT;  Surgeon: Cherylynn Ridges, MD;  [facility];  Service: General;  Laterality: Right;  . Insertion of mesh  02/09/2012    Procedure: INSERTION OF MESH;  Surgeon: Cherylynn Ridges, MD;  Location: [facility];  Service: General;  Laterality: Right;    Current Outpatient Prescriptions  Medication Sig Dispense Refill  . aspirin 81 MG tablet Take 81 mg by mouth daily.      . carvedilol (COREG) 25 MG tablet TAKE 1/2 TABLET BY MOUTH EVERY MORNING AND 1 TABLET EVERY EVENING.  45 tablet  11  . ezetimibe (ZETIA) 10 MG tablet Take 10 mg by mouth daily.      . fenofibrate (TRICOR) 145 MG tablet TAKE 1 TABLET BY MOUTH DAILY.  30 tablet  3  . furosemide (LASIX) 20 MG tablet Take 0.5 tablets (10 mg total) by mouth daily.  30 tablet  11  . lisinopril (PRINIVIL,ZESTRIL) 20 MG tablet Take 0.5 tablets (10 mg total) by mouth daily.  30 tablet  6  . metFORMIN (GLUCOPHAGE)  500 MG tablet Take 500 mg by mouth 2 (two) times daily with a meal.       . NITROSTAT 0.4 MG SL tablet PLACE 1 TABLET (0.4 MG TOTAL) UNDER THE TONGUE EVERY 5 (FIVE) MINUTES AS NEEDED FOR CHEST PAIN.  25 tablet  6  . Omega-3 Fatty Acids (RA FISH OIL) 1400 MG CPDR Take 1,400 mg by mouth 2 (two) times daily.      Marland Kitchen spironolactone (ALDACTONE) 25 MG tablet Take 12.5 mg by mouth daily.       . fenofibrate (TRICOR) 145 MG tablet Take 145 mg by mouth daily.       No current facility-administered medications for this visit.    Allergies  Allergen Reactions  . Statins Other (See Comments)    Myalgias     Review of Systems negative except from HPI and PMH  Physical Exam BP 115/62  Pulse 70 Well developed and well nourished in no acute distress HENT normal E scleral and icterus clear Neck Supple JVP flat; carotids brisk and full Clear to ausculation  Device pocket well healed; without  hematoma or erythema.  There is no tethering Regular rate and rhythm, no murmurs gallops or rub Soft with active bowel sounds No clubbing cyanosis none Edema Alert and oriented, grossly normal motor and sensory function Skin Warm and Dry  ECG demonstrates P. synchronous pacing with a narrow QRS 100 ms  Assessment and  Plan

## 2013-02-11 NOTE — Assessment & Plan Note (Signed)
euvolemic  As above

## 2013-02-11 NOTE — Patient Instructions (Signed)
Your physician recommends that you have lab work today: bmp  Remote monitoring is used to monitor your Pacemaker of ICD from home. This monitoring reduces the number of office visits required to check your device to one time per year. It allows [place] to keep an eye on the functioning of your device to ensure it is working properly. You are scheduled for a device check from home on 05/15/13. You may send your transmission at any time that day. If you have a wireless device, the transmission will be sent automatically. After your physician reviews your transmission, you will receive a postcard with your next transmission date.  Your physician wants you to follow-up in: 1 year with Rick Duff, PA. You will receive a reminder letter in the mail two months in advance. If you don't receive a letter, please call our office to schedule the follow-up appointment.

## 2013-02-11 NOTE — Assessment & Plan Note (Signed)
No recurrent afib

## 2013-02-11 NOTE — Assessment & Plan Note (Signed)
Stable on current medications. He apparently a statin intolerance. We will be measuring his potassium level today. His need to be followed periodically on Aldactone.

## 2013-02-11 NOTE — Assessment & Plan Note (Signed)
Apparently Hgba1c was 5.1  Does he need metformin

## 2013-02-11 NOTE — Assessment & Plan Note (Signed)
The patient's device was interrogated.  The information was reviewed. No changes were made in the programming.    

## 2013-02-17 ENCOUNTER — Encounter: Payer: Self-pay | Admitting: Internal Medicine

## 2013-04-14 ENCOUNTER — Ambulatory Visit (INDEPENDENT_AMBULATORY_CARE_PROVIDER_SITE_OTHER): Payer: 59 | Admitting: Nurse Practitioner

## 2013-04-14 ENCOUNTER — Encounter: Payer: Self-pay | Admitting: Nurse Practitioner

## 2013-04-14 ENCOUNTER — Other Ambulatory Visit: Payer: 59

## 2013-04-14 ENCOUNTER — Ambulatory Visit: Payer: BC Managed Care – PPO | Admitting: Cardiology

## 2013-04-14 VITALS — BP 100/60 | HR 83 | Ht 72.0 in | Wt 217.8 lb

## 2013-04-14 DIAGNOSIS — I255 Ischemic cardiomyopathy: Secondary | ICD-10-CM

## 2013-04-14 DIAGNOSIS — E785 Hyperlipidemia, unspecified: Secondary | ICD-10-CM

## 2013-04-14 DIAGNOSIS — I2589 Other forms of chronic ischemic heart disease: Secondary | ICD-10-CM

## 2013-04-14 DIAGNOSIS — I2510 Atherosclerotic heart disease of native coronary artery without angina pectoris: Secondary | ICD-10-CM

## 2013-04-14 DIAGNOSIS — I4510 Unspecified right bundle-branch block: Secondary | ICD-10-CM

## 2013-04-14 DIAGNOSIS — I5022 Chronic systolic (congestive) heart failure: Secondary | ICD-10-CM

## 2013-04-14 LAB — BASIC METABOLIC PANEL
BUN: 24 mg/dL — ABNORMAL HIGH (ref 6–23)
CALCIUM: 9.3 mg/dL (ref 8.4–10.5)
CO2: 25 meq/L (ref 19–32)
CREATININE: 1.7 mg/dL — AB (ref 0.4–1.5)
Chloride: 100 mEq/L (ref 96–112)
GFR: 44.07 mL/min — ABNORMAL LOW (ref >60.00)
Glucose, Bld: 284 mg/dL — ABNORMAL HIGH (ref 70–99)
Potassium: 4.6 mEq/L (ref 3.5–5.1)
Sodium: 134 mEq/L — ABNORMAL LOW (ref 135–145)

## 2013-04-14 LAB — LIPID PANEL
Cholesterol: 269 mg/dL — ABNORMAL HIGH (ref 0–200)
HDL: 35.2 mg/dL — ABNORMAL LOW (ref >39.00)
TRIGLYCERIDES: 217 mg/dL — AB (ref 0.0–149.0)
Total CHOL/HDL Ratio: 8
VLDL: 43.4 mg/dL — ABNORMAL HIGH (ref 0.0–40.0)

## 2013-04-14 LAB — HEPATIC FUNCTION PANEL
ALT: 23 U/L (ref 0–53)
AST: 17 U/L (ref 0–37)
Albumin: 4.1 g/dL (ref 3.5–5.2)
Alkaline Phosphatase: 32 U/L — ABNORMAL LOW (ref 39–117)
BILIRUBIN DIRECT: 0.1 mg/dL (ref 0.0–0.3)
Total Bilirubin: 0.7 mg/dL (ref 0.3–1.2)
Total Protein: 7 g/dL (ref 6.0–8.3)

## 2013-04-14 LAB — LDL CHOLESTEROL, DIRECT: LDL DIRECT: 206 mg/dL

## 2013-04-14 NOTE — Patient Instructions (Addendum)
We need to check labs today  Stay on your current medicines  Stay as active as possible  See Dr. SwazilandJordan in 6 months  Call the [facility] office at [phone] if you have any questions, problems or concerns.

## 2013-04-14 NOTE — Progress Notes (Signed)
[name] Date of Birth: [birth year] Medical Record #[id number]  History of Present Illness: [name] is seen back today for a 6 month check. Seen for Dr. Ferd Glassing. He has an ischemic CM and has CRT-D in place for primary prevention since April of 2013. Other issues include HLD (statin intolerant and intolerant to zetia), hemochromatosis, RBBB, PAF, type 2 DM and OSA.  Last seen in November by Dr. Graciela Husbands - was doing ok. Had had some improvement with his CHF symptoms with the device implant and was felt to be doing well. Last seen by Dr. Sw[place] back in June.   Comes back today. Here alone. Doing ok. No chest pain. Breathing is stable - will get short winded if he over exerts. He has learned to pace his activities. Will get dizzy if he bends over. Overall his symptoms are stable and he feels like he is doing well. No ICD shocks. Tolerating his medicines. No longer on Zetia due to myalgias.    Current Outpatient Prescriptions  Medication Sig Dispense Refill  . aspirin 81 MG tablet Take 81 mg by mouth daily.      . carvedilol (COREG) 25 MG tablet TAKE 1/2 TABLET BY MOUTH EVERY MORNING AND 1 TABLET EVERY EVENING.  45 tablet  11  . fenofibrate (TRICOR) 145 MG tablet TAKE 1 TABLET BY MOUTH DAILY.  30 tablet  3  . furosemide (LASIX) 20 MG tablet Take 0.5 tablets (10 mg total) by mouth daily.  30 tablet  11  . lisinopril (PRINIVIL,ZESTRIL) 20 MG tablet Take 0.5 tablets (10 mg total) by mouth daily.  30 tablet  6  . metFORMIN (GLUCOPHAGE) 500 MG tablet Take 500 mg by mouth 2 (two) times daily with a meal.       . NITROSTAT 0.4 MG SL tablet PLACE 1 TABLET (0.4 MG TOTAL) UNDER THE TONGUE EVERY 5 (FIVE) MINUTES AS NEEDED FOR CHEST PAIN.  25 tablet  6  . Omega-3 Fatty Acids (RA FISH OIL) 1400 MG CPDR Take 1,400 mg by mouth 2 (two) times daily.      Marland Kitchen spironolactone (ALDACTONE) 25 MG tablet Take 12.5 mg by mouth daily.       . fenofibrate (TRICOR) 145 MG tablet Take 145 mg by mouth daily.       No  current facility-administered medications for this visit.    Allergies  Allergen Reactions  . Statins Other (See Comments)    Myalgias     Past Medical History  Diagnosis Date  . Ischemic cardiomyopathy     EF 36%; prior anterior MI, s/p CABG x 3; s/p cath Feb 2013 showing grafts to be patent with severe LV dysfunction  . Chronic systolic heart failure     class 2-3  . Hyperlipidemia   . Hemochromatosis   . RBBB (right bundle branch block)   . Rotator cuff injury   . PAF (paroxysmal atrial fibrillation)   . Anemia   . AMI anterior wall 2006  . CHF (congestive heart failure)   . Angina     "upon exertion"  . Pneumonia ~ 2001    "chemical; from smelling chlorox"  . Exertional dyspnea   . Type II diabetes mellitus   . ICD (implantable cardiac defibrillator) in place   . Pacemaker   . ICD-Medtronic-CRT 08/24/2011  . Sleep apnea     no cpap  . Coronary artery disease     Past Surgical History  Procedure Laterality Date  . Cardiac catheterization  03/12/2005  INFERIOR WALL AKINESIA. THERE IS GLOBAL HYPOKINESIA WITH OVERALL MODERATE TO SEVERE LEFT VENTRICULAR SYSTOLIC DYSFUNCTION. EF 35%  . Coronary artery bypass graft  2006    Emergent CABG x 3 with LIMA to LAD, SVG to OM, SVG to distal RCA per Dr. Tyrone SageGerhardt  . Rotator cuff repair  ?2000/~ 2007    left; right   . Cardiac catheterization  Feb 2013    Grafts patent. EF is 20 to 25%.  . Cardiac defibrillator placement  08/21/11    "pacer/defib"  . Insert / replace / remove pacemaker    . Joint replacement      bil shoulders  . Inguinal hernia repair  02/09/2012    WITH MESH  . Inguinal hernia repair  02/09/2012    Procedure: HERNIA REPAIR INGUINAL ADULT;  Surgeon: Cherylynn RidgesJames O Wyatt, MD;  [facility];  Service: General;  Laterality: Right;  . Insertion of mesh  02/09/2012    Procedure: INSERTION OF MESH;  Surgeon: Cherylynn RidgesJames O Wyatt, MD;  [facility];  Service: General;  Laterality: Right;    History  Smoking status    . Former Smoker -- 2.00 packs/day for 30 years  . Types: Cigarettes  . Quit date: 03/11/2005  Smokeless tobacco  . Never Used    History  Alcohol Use  . 1.8 oz/week  . 3 Cans of beer per week    Comment: weekly    Family History  Problem Relation Age of Onset  . Adopted: Yes    Review of Systems: The review of systems is per the HPI.  All other systems were reviewed and are negative.  Physical Exam: BP 100/60  Pulse 83  Ht 6' (1.829 m)  Wt 217 lb 12.8 oz (98.793 kg)  BMI 29.53 kg/m2  SpO2 99% Patient is very pleasant and in no acute distress. Skin is warm and dry. Color is normal.  HEENT is unremarkable. Normocephalic/atraumatic. PERRL. Sclera are nonicteric. Neck is supple. No masses. No JVD. Lungs are clear. Cardiac exam shows a regular rate and rhythm. Abdomen is soft. Extremities are without edema. Gait and ROM are intact. No gross neurologic deficits noted.  Wt Readings from Last 3 Encounters:  04/14/13 217 lb 12.8 oz (98.793 kg)  09/18/12 219 lb 4 oz (99.451 kg)  09/06/12 218 lb (98.884 kg)   LABORATORY DATA:  PENDING  Lab Results  Component Value Date   WBC 4.4 02/01/2012   HGB 12.3* 02/01/2012   HCT 35.8* 02/01/2012   PLT 245 02/01/2012   GLUCOSE 159* 02/11/2013   CHOL 241* 05/10/2012   TRIG 199.0* 05/10/2012   HDL 35.30* 05/10/2012   LDLDIRECT 162.7 05/10/2012   LDLCALC 116* 01/13/2011   ALT 34 05/10/2012   AST 31 05/10/2012   NA 137 02/11/2013   K 4.6 02/11/2013   CL 103 02/11/2013   CREATININE 1.4 02/11/2013   BUN 14 02/11/2013   CO2 26 02/11/2013   INR 0.9 08/16/2011   HGBA1C 6.0 10/06/2010     Assessment / Plan: 1. Chronic systolic CHF with ejection fraction of 45-50 % post CRT. Clinically improved with biventricular ICD pacing. Currently class I- II. He does not appear to be volume overloaded on exam today - weight is actually down. Will leave him on his current regimen. See again in 6 months.  2. Ischemic cardiomyopathy.   3. Coronary disease status  post CABG in 2006. Cardiac catheterization in February 2013 showed all grafts were patent. Clinically without chest pain.  4. Right  bundle branch block.   5. Hyperlipidemia. Patient is intolerant to statins. He is on fenofibrate only. Needs follow up labs today.  Patient is agreeable to this plan and will call if any problems develop in the interim.   Rosalio Macadamia, RN, ANP-C [facility] [address] [place], [place]  [address]

## 2013-04-15 ENCOUNTER — Telehealth: Payer: Self-pay | Admitting: Cardiology

## 2013-04-15 NOTE — Telephone Encounter (Signed)
Physical Capabilities Form rec From [employer] gave to [facility] For Completion  Once Completed send to [facility] For records to be sent

## 2013-04-28 ENCOUNTER — Telehealth: Payer: Self-pay | Admitting: Cardiology

## 2013-04-28 NOTE — Telephone Encounter (Signed)
Returned call to patient no answer.LMTC. 

## 2013-04-28 NOTE — Telephone Encounter (Signed)
New Prob   Calling in regards to a disability claim. States a form was faxed over about 1 month ago. Please call.

## 2013-04-29 NOTE — Telephone Encounter (Signed)
Returned call to [employer]odi Weaver at [employer] no answer.Left message on personal voice mail never received disability claim form,refax form.

## 2013-05-02 NOTE — Telephone Encounter (Signed)
Spoke to Pitney BowesCodi with [employer] she will [facility].

## 2013-05-05 ENCOUNTER — Other Ambulatory Visit: Payer: Self-pay | Admitting: *Deleted

## 2013-05-05 ENCOUNTER — Telehealth: Payer: Self-pay | Admitting: *Deleted

## 2013-05-05 MED ORDER — FENOFIBRATE 160 MG PO TABS: 160.0000 mg | ORAL_TABLET | Freq: Every day | ORAL | Status: DC

## 2013-05-05 NOTE — Telephone Encounter (Signed)
May switch Tricor to generic fenofibrate 160 mg daily.  Marry Kusch [place]Jordan MD, [facility]

## 2013-05-05 NOTE — Telephone Encounter (Signed)
Rx sent in for patient

## 2013-05-05 NOTE — Telephone Encounter (Signed)
[facility] called to see if we could change the patient to fenofibrate 160mg , which is on her preferred drug list. It is much more affordable and it will save her $90.00/month. Please advise. Thanks, MI

## 2013-05-15 ENCOUNTER — Encounter: Payer: Self-pay | Admitting: Internal Medicine

## 2013-05-15 ENCOUNTER — Ambulatory Visit (INDEPENDENT_AMBULATORY_CARE_PROVIDER_SITE_OTHER): Payer: 59 | Admitting: *Deleted

## 2013-05-15 DIAGNOSIS — I4891 Unspecified atrial fibrillation: Secondary | ICD-10-CM

## 2013-05-15 DIAGNOSIS — I480 Paroxysmal atrial fibrillation: Secondary | ICD-10-CM

## 2013-05-15 LAB — MDC_IDC_ENUM_SESS_TYPE_REMOTE
Brady Statistic AS VP Percent: 99.8 %
Brady Statistic AS VS Percent: 0.1 %
Lead Channel Impedance Value: 475 Ohm
Lead Channel Impedance Value: 475 Ohm
Lead Channel Impedance Value: 532 Ohm
Lead Channel Impedance Value: 532 Ohm
Lead Channel Pacing Threshold Amplitude: 0.625 V
Lead Channel Pacing Threshold Amplitude: 0.75 V
Lead Channel Pacing Threshold Pulse Width: 0.4 ms
Lead Channel Pacing Threshold Pulse Width: 0.4 ms
Lead Channel Pacing Threshold Pulse Width: 0.4 ms
Lead Channel Setting Pacing Amplitude: 2 V
Lead Channel Setting Pacing Amplitude: 2 V
Lead Channel Setting Pacing Pulse Width: 0.4 ms
Lead Channel Setting Pacing Pulse Width: 1.5 ms
Lead Channel Setting Sensing Sensitivity: 0.45 mV
MDC IDC MSMT LEADCHNL LV PACING THRESHOLD AMPLITUDE: 2 V
MDC IDC MSMT LEADCHNL RA SENSING INTR AMPL: 1.8 mV
MDC IDC MSMT LEADCHNL RV SENSING INTR AMPL: 9.6 mV
MDC IDC SET LEADCHNL RV PACING AMPLITUDE: 2.5 V
MDC IDC SET ZONE DETECTION INTERVAL: 300 ms
MDC IDC STAT BRADY AP VP PERCENT: 0.1 %
MDC IDC STAT BRADY AP VS PERCENT: 0.1 %
Zone Setting Detection Interval: 250 ms
Zone Setting Detection Interval: 350 ms
Zone Setting Detection Interval: 400 ms

## 2013-06-04 ENCOUNTER — Encounter: Payer: Self-pay | Admitting: *Deleted

## 2013-08-18 ENCOUNTER — Ambulatory Visit (INDEPENDENT_AMBULATORY_CARE_PROVIDER_SITE_OTHER): Payer: 59 | Admitting: *Deleted

## 2013-08-18 ENCOUNTER — Encounter: Payer: Self-pay | Admitting: Internal Medicine

## 2013-08-18 DIAGNOSIS — I2589 Other forms of chronic ischemic heart disease: Secondary | ICD-10-CM

## 2013-08-18 DIAGNOSIS — I5022 Chronic systolic (congestive) heart failure: Secondary | ICD-10-CM

## 2013-08-18 DIAGNOSIS — I255 Ischemic cardiomyopathy: Secondary | ICD-10-CM

## 2013-08-18 LAB — MDC_IDC_ENUM_SESS_TYPE_REMOTE
Battery Voltage: 2.91 V
Brady Statistic AP VP Percent: 0.1 % — CL
Brady Statistic AS VP Percent: 99.7 %
HIGH POWER IMPEDANCE MEASURED VALUE: 73 Ohm
Lead Channel Impedance Value: 475 Ohm
Lead Channel Impedance Value: 589 Ohm
Lead Channel Pacing Threshold Amplitude: 0.625 V
Lead Channel Pacing Threshold Amplitude: 4.5 V
Lead Channel Pacing Threshold Pulse Width: 0.4 ms
Lead Channel Pacing Threshold Pulse Width: 1.5 ms
Lead Channel Sensing Intrinsic Amplitude: 1.8 mV
Lead Channel Setting Pacing Amplitude: 2.5 V
Lead Channel Setting Pacing Pulse Width: 0.4 ms
Lead Channel Setting Pacing Pulse Width: 1.5 ms
MDC IDC MSMT LEADCHNL RV IMPEDANCE VALUE: 589 Ohm
MDC IDC MSMT LEADCHNL RV PACING THRESHOLD AMPLITUDE: 0.75 V
MDC IDC MSMT LEADCHNL RV PACING THRESHOLD PULSEWIDTH: 0.4 ms
MDC IDC MSMT LEADCHNL RV SENSING INTR AMPL: 9.9 mV
MDC IDC SET LEADCHNL LV PACING AMPLITUDE: 2 V
MDC IDC SET LEADCHNL RA PACING AMPLITUDE: 2 V
MDC IDC SET LEADCHNL RV SENSING SENSITIVITY: 0.45 mV
MDC IDC SET ZONE DETECTION INTERVAL: 300 ms
MDC IDC SET ZONE DETECTION INTERVAL: 400 ms
MDC IDC STAT BRADY AP VS PERCENT: 0.1 % — AB
MDC IDC STAT BRADY AS VS PERCENT: 0.2 %
Zone Setting Detection Interval: 250 ms
Zone Setting Detection Interval: 350 ms

## 2013-08-18 NOTE — Progress Notes (Signed)
Remote ICD transmission.   

## 2013-09-02 ENCOUNTER — Other Ambulatory Visit: Payer: Self-pay | Admitting: Cardiology

## 2013-09-13 ENCOUNTER — Other Ambulatory Visit: Payer: Self-pay | Admitting: Cardiology

## 2013-09-17 ENCOUNTER — Encounter: Payer: 59 | Admitting: Internal Medicine

## 2013-10-01 ENCOUNTER — Encounter: Payer: Self-pay | Admitting: Internal Medicine

## 2013-10-01 ENCOUNTER — Ambulatory Visit (INDEPENDENT_AMBULATORY_CARE_PROVIDER_SITE_OTHER): Payer: 59 | Admitting: *Deleted

## 2013-10-01 DIAGNOSIS — I255 Ischemic cardiomyopathy: Secondary | ICD-10-CM

## 2013-10-01 DIAGNOSIS — I2589 Other forms of chronic ischemic heart disease: Secondary | ICD-10-CM

## 2013-10-01 DIAGNOSIS — I5022 Chronic systolic (congestive) heart failure: Secondary | ICD-10-CM

## 2013-10-01 LAB — MDC_IDC_ENUM_SESS_TYPE_INCLINIC
Battery Voltage: 2.86 V
Brady Statistic AP VP Percent: 0.08 %
Brady Statistic AP VS Percent: 0.02 %
Brady Statistic AS VP Percent: 99.65 %
Brady Statistic RA Percent Paced: 0.1 %
Brady Statistic RV Percent Paced: 99.73 %
Date Time Interrogation Session: 20150624194407
HIGH POWER IMPEDANCE MEASURED VALUE: 589 Ohm
HighPow Impedance: 0 Ohm
HighPow Impedance: 66 Ohm
Lead Channel Impedance Value: 475 Ohm
Lead Channel Impedance Value: 532 Ohm
Lead Channel Pacing Threshold Amplitude: 0.75 V
Lead Channel Pacing Threshold Pulse Width: 0.4 ms
Lead Channel Pacing Threshold Pulse Width: 0.4 ms
Lead Channel Sensing Intrinsic Amplitude: 2.125 mV
Lead Channel Setting Pacing Amplitude: 4.25 V
Lead Channel Setting Pacing Pulse Width: 1.5 ms
Lead Channel Setting Sensing Sensitivity: 0.45 mV
MDC IDC MSMT LEADCHNL LV IMPEDANCE VALUE: 646 Ohm
MDC IDC MSMT LEADCHNL LV IMPEDANCE VALUE: 988 Ohm
MDC IDC MSMT LEADCHNL LV PACING THRESHOLD AMPLITUDE: 3.75 V
MDC IDC MSMT LEADCHNL LV PACING THRESHOLD PULSEWIDTH: 1.5 ms
MDC IDC MSMT LEADCHNL RA PACING THRESHOLD AMPLITUDE: 0.75 V
MDC IDC MSMT LEADCHNL RA SENSING INTR AMPL: 2.125 mV
MDC IDC MSMT LEADCHNL RV IMPEDANCE VALUE: 589 Ohm
MDC IDC MSMT LEADCHNL RV SENSING INTR AMPL: 10.375 mV
MDC IDC MSMT LEADCHNL RV SENSING INTR AMPL: 10.375 mV
MDC IDC SET LEADCHNL RA PACING AMPLITUDE: 2 V
MDC IDC SET LEADCHNL RV PACING AMPLITUDE: 2.5 V
MDC IDC SET LEADCHNL RV PACING PULSEWIDTH: 0.4 ms
MDC IDC SET ZONE DETECTION INTERVAL: 300 ms
MDC IDC SET ZONE DETECTION INTERVAL: 350 ms
MDC IDC SET ZONE DETECTION INTERVAL: 400 ms
MDC IDC STAT BRADY AS VS PERCENT: 0.25 %
Zone Setting Detection Interval: 250 ms

## 2013-10-03 NOTE — Addendum Note (Signed)
Addended by: Sebastian AcheSAUNDERS, SHAKILA S on: 10/03/2013 11:46 AM   Modules accepted: Level of Service

## 2013-10-03 NOTE — Progress Notes (Signed)
CRT-D device check per SK for LV vector change or decrease in LV output. Thresholds were tested in all (3) vector options. No difference was noted between them. Pt states that threshold has always been high and attempts to decrease have been tried in past therefore a cxr was not ordered. Threshold appears the same as the "in clinic" checks from 2013. Output appropriately programmed at (+0.5) safety margin. Patient to follow up as scheduled.

## 2013-10-28 ENCOUNTER — Other Ambulatory Visit: Payer: Self-pay | Admitting: Cardiology

## 2013-10-29 NOTE — Telephone Encounter (Signed)
Per telephone note 05/05/13 160 mg fenofibrate

## 2013-11-20 ENCOUNTER — Telehealth: Payer: Self-pay | Admitting: Cardiology

## 2013-11-20 ENCOUNTER — Encounter: Payer: Medicare Other | Admitting: *Deleted

## 2013-11-20 NOTE — Telephone Encounter (Signed)
LMOVM reminding pt to send remote transmission.   

## 2013-11-21 ENCOUNTER — Encounter: Payer: Self-pay | Admitting: Cardiology

## 2013-11-27 ENCOUNTER — Ambulatory Visit (INDEPENDENT_AMBULATORY_CARE_PROVIDER_SITE_OTHER): Payer: Medicare Other | Admitting: *Deleted

## 2013-11-27 DIAGNOSIS — I2589 Other forms of chronic ischemic heart disease: Secondary | ICD-10-CM

## 2013-11-27 DIAGNOSIS — I5022 Chronic systolic (congestive) heart failure: Secondary | ICD-10-CM

## 2013-11-27 NOTE — Progress Notes (Signed)
Remote ICD transmission.   

## 2013-12-02 ENCOUNTER — Other Ambulatory Visit: Payer: Self-pay

## 2013-12-02 ENCOUNTER — Encounter: Payer: Self-pay | Admitting: *Deleted

## 2013-12-02 MED ORDER — CARVEDILOL 25 MG PO TABS: ORAL_TABLET | ORAL | Status: DC

## 2013-12-03 LAB — MDC_IDC_ENUM_SESS_TYPE_REMOTE
Brady Statistic AP VP Percent: 0.06 %
Brady Statistic AP VS Percent: 0.02 %
Brady Statistic AS VS Percent: 0.59 %
Brady Statistic RV Percent Paced: 99.4 %
HIGH POWER IMPEDANCE MEASURED VALUE: 0 Ohm
HighPow Impedance: 551 Ohm
HighPow Impedance: 61 Ohm
Lead Channel Impedance Value: 475 Ohm
Lead Channel Impedance Value: 475 Ohm
Lead Channel Impedance Value: 551 Ohm
Lead Channel Impedance Value: 551 Ohm
Lead Channel Impedance Value: 874 Ohm
Lead Channel Pacing Threshold Amplitude: 0.625 V
Lead Channel Pacing Threshold Amplitude: 3.5 V
Lead Channel Pacing Threshold Pulse Width: 0.4 ms
Lead Channel Pacing Threshold Pulse Width: 1.5 ms
Lead Channel Sensing Intrinsic Amplitude: 10.125 mV
Lead Channel Sensing Intrinsic Amplitude: 2 mV
Lead Channel Setting Pacing Amplitude: 2 V
Lead Channel Setting Pacing Pulse Width: 0.4 ms
Lead Channel Setting Sensing Sensitivity: 0.45 mV
MDC IDC MSMT BATTERY VOLTAGE: 2.74 V
MDC IDC MSMT LEADCHNL RV PACING THRESHOLD AMPLITUDE: 0.75 V
MDC IDC MSMT LEADCHNL RV PACING THRESHOLD PULSEWIDTH: 0.4 ms
MDC IDC SESS DTM: 20150820152403
MDC IDC SET LEADCHNL LV PACING AMPLITUDE: 4.25 V
MDC IDC SET LEADCHNL LV PACING PULSEWIDTH: 1.5 ms
MDC IDC SET LEADCHNL RV PACING AMPLITUDE: 2.5 V
MDC IDC SET ZONE DETECTION INTERVAL: 350 ms
MDC IDC SET ZONE DETECTION INTERVAL: 400 ms
MDC IDC STAT BRADY AS VP PERCENT: 99.34 %
MDC IDC STAT BRADY RA PERCENT PACED: 0.08 %
Zone Setting Detection Interval: 250 ms
Zone Setting Detection Interval: 300 ms

## 2013-12-09 ENCOUNTER — Encounter: Payer: Self-pay | Admitting: Cardiology

## 2013-12-29 ENCOUNTER — Encounter: Payer: Self-pay | Admitting: Internal Medicine

## 2013-12-31 ENCOUNTER — Other Ambulatory Visit: Payer: Self-pay | Admitting: Cardiology

## 2014-02-12 ENCOUNTER — Encounter: Payer: Medicare Other | Admitting: Cardiology

## 2014-02-26 ENCOUNTER — Ambulatory Visit (INDEPENDENT_AMBULATORY_CARE_PROVIDER_SITE_OTHER): Payer: Medicare Other | Admitting: Internal Medicine

## 2014-02-26 ENCOUNTER — Encounter: Payer: Self-pay | Admitting: Internal Medicine

## 2014-02-26 VITALS — BP 116/64 | HR 76 | Ht 72.0 in | Wt 216.0 lb

## 2014-02-26 DIAGNOSIS — I255 Ischemic cardiomyopathy: Secondary | ICD-10-CM

## 2014-02-26 DIAGNOSIS — T82110D Breakdown (mechanical) of cardiac electrode, subsequent encounter: Secondary | ICD-10-CM

## 2014-02-26 DIAGNOSIS — I5022 Chronic systolic (congestive) heart failure: Secondary | ICD-10-CM

## 2014-02-26 DIAGNOSIS — Z4502 Encounter for adjustment and management of automatic implantable cardiac defibrillator: Secondary | ICD-10-CM

## 2014-02-26 LAB — MDC_IDC_ENUM_SESS_TYPE_INCLINIC
Brady Statistic AP VP Percent: 0.06 %
Brady Statistic AP VS Percent: 0.02 %
Brady Statistic AS VP Percent: 99.57 %
Brady Statistic AS VS Percent: 0.36 %
Brady Statistic RA Percent Paced: 0.07 %
Brady Statistic RV Percent Paced: 99.62 %
HIGH POWER IMPEDANCE MEASURED VALUE: 71 Ohm
HighPow Impedance: 19 Ohm
HighPow Impedance: 551 Ohm
Lead Channel Impedance Value: 475 Ohm
Lead Channel Impedance Value: 532 Ohm
Lead Channel Impedance Value: 988 Ohm
Lead Channel Pacing Threshold Amplitude: 0.75 V
Lead Channel Pacing Threshold Amplitude: 0.75 V
Lead Channel Pacing Threshold Pulse Width: 0.4 ms
Lead Channel Sensing Intrinsic Amplitude: 1.375 mV
Lead Channel Sensing Intrinsic Amplitude: 13.375 mV
Lead Channel Setting Pacing Amplitude: 2 V
Lead Channel Setting Pacing Amplitude: 4.25 V
Lead Channel Setting Pacing Pulse Width: 1.5 ms
MDC IDC MSMT BATTERY VOLTAGE: 2.63 V
MDC IDC MSMT LEADCHNL LV IMPEDANCE VALUE: 608 Ohm
MDC IDC MSMT LEADCHNL RA PACING THRESHOLD PULSEWIDTH: 0.4 ms
MDC IDC MSMT LEADCHNL RV IMPEDANCE VALUE: 589 Ohm
MDC IDC SESS DTM: 20151119151950
MDC IDC SET LEADCHNL RV PACING AMPLITUDE: 2.5 V
MDC IDC SET LEADCHNL RV PACING PULSEWIDTH: 0.4 ms
MDC IDC SET LEADCHNL RV SENSING SENSITIVITY: 0.45 mV
MDC IDC SET ZONE DETECTION INTERVAL: 400 ms
Zone Setting Detection Interval: 250 ms
Zone Setting Detection Interval: 300 ms
Zone Setting Detection Interval: 350 ms

## 2014-02-26 NOTE — Progress Notes (Signed)
Patient Care Team: Minda Meoichard A Aronson, MD as PCP - General (Internal Medicine)   HPI  [name] is a [age] y.o. male Seen in followup for an CRT-D implanted for primary prevention April 2013 for ischemic cardiomyopathy and prior myocardial infarction and right bundle branch block.   He is modestly improved following CRT. He is less winded. He still develops fatigue.  He has daytime somnolence and obstructive sleep apnea. He's had a sleep study but is not inclined to CPAP therapy.  He is at high outputs require from the LV lead. His device has reached ERI 28 months.  He's had no chest pain edema. There've been no intercurrent ICD discharges   Past Medical History  Diagnosis Date  . Ischemic cardiomyopathy     EF 36%; prior anterior MI, s/p CABG x 3; s/p cath Feb 2013 showing grafts to be patent with severe LV dysfunction  . Chronic systolic heart failure     class 2-3  . Hyperlipidemia   . Hemochromatosis   . RBBB (right bundle branch block)   . Rotator cuff injury   . PAF (paroxysmal atrial fibrillation)   . Anemia   . AMI anterior wall 2006  . CHF (congestive heart failure)   . Angina     "upon exertion"  . Pneumonia ~ 2001    "chemical; from smelling chlorox"  . Exertional dyspnea   . Type II diabetes mellitus   . ICD (implantable cardiac defibrillator) in place   . Pacemaker   . ICD-Medtronic-CRT 08/24/2011  . Sleep apnea     no cpap  . Coronary artery disease     Past Surgical History  Procedure Laterality Date  . Cardiac catheterization  03/12/2005    INFERIOR WALL AKINESIA. THERE IS GLOBAL HYPOKINESIA WITH OVERALL MODERATE TO SEVERE LEFT VENTRICULAR SYSTOLIC DYSFUNCTION. EF 35%  . Coronary artery bypass graft  2006    Emergent CABG x 3 with LIMA to LAD, SVG to OM, SVG to distal RCA per Dr. Tyrone SageGerhardt  . Rotator cuff repair  ?2000/~ 2007    left; right   . Cardiac catheterization  Feb 2013    Grafts patent. EF is 20 to 25%.  . Cardiac defibrillator  placement  08/21/11    "pacer/defib"  . Insert / replace / remove pacemaker    . Joint replacement      bil shoulders  . Inguinal hernia repair  02/09/2012    WITH MESH  . Inguinal hernia repair  02/09/2012    Procedure: HERNIA REPAIR INGUINAL ADULT;  Surgeon: Cherylynn RidgesJames O Wyatt, MD;  [facility];  Service: General;  Laterality: Right;  . Insertion of mesh  02/09/2012    Procedure: INSERTION OF MESH;  Surgeon: Cherylynn RidgesJames O Wyatt, MD;  [facility];  Service: General;  Laterality: Right;    Current Outpatient Prescriptions  Medication Sig Dispense Refill  . aspirin 81 MG tablet Take 81 mg by mouth daily.    . carvedilol (COREG) 25 MG tablet TAKE 1/2 TABLET BY MOUTH EVERY MORNING AND 1 TABLET EVERY EVENING. 45 tablet 2  . fenofibrate 160 MG tablet TAKE 1 TABLET BY MOUTH DAILY. 30 tablet 5  . furosemide (LASIX) 20 MG tablet TAKE 1/2 TABLET (10 MG TOTAL) BY MOUTH DAILY. 30 tablet 3  . lisinopril (PRINIVIL,ZESTRIL) 20 MG tablet TAKE 1/2 TABLET BY MOUTH DAILY. 30 tablet 3  . metFORMIN (GLUCOPHAGE) 500 MG tablet Take 500 mg by mouth 2 (two) times daily with  a meal.     . NITROSTAT 0.4 MG SL tablet PLACE 1 TABLET (0.4 MG TOTAL) UNDER THE TONGUE EVERY 5 (FIVE) MINUTES AS NEEDED FOR CHEST PAIN. 25 tablet 6  . Omega-3 Fatty Acids (RA FISH OIL) 1400 MG CPDR Take 1,400 mg by mouth 2 (two) times daily.    Marland Kitchen. spironolactone (ALDACTONE) 25 MG tablet TAKE 1/2 TABLET BY MOUTH EVERY MORNING. 30 tablet 0   No current facility-administered medications for this visit.    Allergies  Allergen Reactions  . Statins Other (See Comments)    Myalgias   . Zetia [Ezetimibe]     myalgias    Review of Systems negative except from HPI and PMH  Physical Exam Ht 6' (1.829 m)  Wt 97.977 kg (216 lb)  BMI 29.29 kg/m2 Well developed and well nourished in no acute distress HENT normal E scleral and icterus clear Neck Supple JVP flat; carotids brisk and full Clear to ausculation  Device pocket well healed; without  hematoma or erythema.  There is no tethering Regular rate and rhythm, no murmurs gallops or rub Soft with active bowel sounds No clubbing cyanosis none Edema Alert and oriented, grossly normal motor and sensory function Skin Warm and Dry  ECG demonstrates P. synchronous pacing with a narrow QRS 100 ms  Assessment and  Plan  Ischemic cardiomyopathy  CRT-Medtronic approaching ERI  Congestive heart failure-chronic systolic  High output LV capture threshold  We will plan to decrease his LV output now to see comments benefits include LV pacing. As his underlying issues right bundle branch block, perhaps RV pacing might be sufficient in this might obviate the need for CRT-D generator replacement which will be associated, with his LV threshold, of very short longevity.  I ran  series of ECG is a different AV delay is to minimize QRS duration  And has landed in the 110 ms with a QRS duration 132 ms and is used QRS pattern predominantly QS in lead V1  We spent more than 50% of our >40 min visit in face to face counseling regarding the above

## 2014-02-26 NOTE — Patient Instructions (Addendum)
Your physician recommends that you continue on your current medications as directed. Please refer to the Current Medication list given to you today.  Your physician recommends that you schedule a follow-up appointment in: 3 months with Dr. Klein.  

## 2014-02-28 ENCOUNTER — Other Ambulatory Visit: Payer: Self-pay | Admitting: Cardiology

## 2014-03-19 ENCOUNTER — Encounter (HOSPITAL_COMMUNITY): Payer: Self-pay | Admitting: Cardiology

## 2014-04-27 ENCOUNTER — Other Ambulatory Visit: Payer: Self-pay | Admitting: Cardiology

## 2014-04-28 NOTE — Telephone Encounter (Signed)
Please advise on what current dose should be. Thanks, MI 

## 2014-04-28 NOTE — Telephone Encounter (Signed)
Patient called spoke to wife.I received a message patient needs refill for fenofibrate,after reviewing chart patient needs follow up with Dr.Jordan.Appointment scheduled with Dr.Jordan 06/18/14 [facility] 9[facility].Fenofibrate refill sent to pharmacy.

## 2014-05-07 ENCOUNTER — Telehealth: Payer: Self-pay | Admitting: *Deleted

## 2014-05-07 ENCOUNTER — Encounter: Payer: Self-pay | Admitting: Cardiovascular Disease

## 2014-05-07 NOTE — Telephone Encounter (Signed)
Pt was called to let know device reached ERI and alert on [facility]. Pt thought heard alert tone this am but was not sure. Appointment rescheduled from 2-29 to 05-26-14 @ 1415 with SK. Pt aware that alert will sound q morning until device interrogated. Pt understands.

## 2014-05-14 ENCOUNTER — Other Ambulatory Visit: Payer: Self-pay | Admitting: Cardiology

## 2014-05-26 ENCOUNTER — Encounter: Payer: Self-pay | Admitting: *Deleted

## 2014-05-26 ENCOUNTER — Ambulatory Visit (INDEPENDENT_AMBULATORY_CARE_PROVIDER_SITE_OTHER): Payer: Medicare Other | Admitting: Internal Medicine

## 2014-05-26 ENCOUNTER — Encounter: Payer: Self-pay | Admitting: Internal Medicine

## 2014-05-26 VITALS — BP 100/60 | HR 86 | Ht 72.0 in | Wt 218.4 lb

## 2014-05-26 DIAGNOSIS — Z4502 Encounter for adjustment and management of automatic implantable cardiac defibrillator: Secondary | ICD-10-CM

## 2014-05-26 DIAGNOSIS — Z01812 Encounter for preprocedural laboratory examination: Secondary | ICD-10-CM

## 2014-05-26 DIAGNOSIS — I5022 Chronic systolic (congestive) heart failure: Secondary | ICD-10-CM

## 2014-05-26 DIAGNOSIS — I255 Ischemic cardiomyopathy: Secondary | ICD-10-CM

## 2014-05-26 NOTE — Progress Notes (Signed)
.      Patient Care Team: Minda Meo, MD as PCP - General (Internal Medicine)   HPI  [name] is a 59 y.o. male Seen in followup for an CRT-D implanted for primary prevention April 2013 for ischemic cardiomyopathy and prior myocardial infarction and right bundle branch block.   He is modestly improved following CRT. He is less winded. He still develops fatigue.  He has daytime somnolence and obstructive sleep apnea. He's had a sleep study but is not inclined to CPAP therapy.  There is been no significant change in functional status following the off programming of his LV lead    He's had no chest pain edema. There've been no intercurrent ICD discharges   Past Medical History  Diagnosis Date  . Ischemic cardiomyopathy     EF 36%; prior anterior MI, s/p CABG x 3; s/p cath Feb 2013 showing grafts to be patent with severe LV dysfunction  . Chronic systolic heart failure     class 2-3  . Hyperlipidemia   . Hemochromatosis   . RBBB (right bundle branch block)   . Rotator cuff injury   . PAF (paroxysmal atrial fibrillation)   . Anemia   . AMI anterior wall 2006  . CHF (congestive heart failure)   . Angina     "upon exertion"  . Pneumonia ~ 2001    "chemical; from smelling chlorox"  . Exertional dyspnea   . Type II diabetes mellitus   . ICD (implantable cardiac defibrillator) in place   . Pacemaker   . ICD-Medtronic-CRT 08/24/2011  . Sleep apnea     no cpap  . Coronary artery disease     Past Surgical History  Procedure Laterality Date  . Cardiac catheterization  03/12/2005    INFERIOR WALL AKINESIA. THERE IS GLOBAL HYPOKINESIA WITH OVERALL MODERATE TO SEVERE LEFT VENTRICULAR SYSTOLIC DYSFUNCTION. EF 35%  . Coronary artery bypass graft  2006    Emergent CABG x 3 with LIMA to LAD, SVG to OM, SVG to distal RCA per Dr. Tyrone Sage  . Rotator cuff repair  ?2000/~ 2007    left; right   . Cardiac catheterization  Feb 2013    Grafts patent. EF is 20 to 25%.  .  Cardiac defibrillator placement  08/21/11    "pacer/defib"  . Insert / replace / remove pacemaker    . Joint replacement      bil shoulders  . Inguinal hernia repair  02/09/2012    WITH MESH  . Inguinal hernia repair  02/09/2012    Procedure: HERNIA REPAIR INGUINAL ADULT;  Surgeon: Cherylynn Ridges, MD;  Location: [facility] OR;  Service: General;  Laterality: Right;  . Insertion of mesh  02/09/2012    Procedure: INSERTION OF MESH;  Surgeon: Cherylynn Ridges, MD;  Location: [facility] OR;  Service: General;  Laterality: Right;  . Left heart catheterization with coronary/graft angiogram N/A 05/25/2011    Procedure: LEFT HEART CATHETERIZATION WITH Isabel Caprice;  Surgeon: Peter M Swaziland, MD;  Location: [facility] CATH LAB;  Service: Cardiovascular;  Laterality: N/A;  . Bi-ventricular implantable cardioverter defibrillator N/A 08/21/2011    Procedure: BI-VENTRICULAR IMPLANTABLE CARDIOVERTER DEFIBRILLATOR  (CRT-D);  Surgeon: Duke Salvia, MD;  Location: [facility] CATH LAB;  Service: Cardiovascular;  Laterality: N/A;    Current Outpatient Prescriptions  Medication Sig Dispense Refill  . aspirin 81 MG tablet Take 81 mg by mouth daily.    . carvedilol (COREG) 25 MG tablet Take 1/2 tablet by mouth every  morning and 1 tablet by mouth every evening. 45 tablet 6  . fenofibrate 160 MG tablet TAKE 1 TABLET BY MOUTH DAILY. 30 tablet 3  . furosemide (LASIX) 20 MG tablet TAKE 1/2 TABLET BY MOUTH DAILY. 30 tablet 3  . lisinopril (PRINIVIL,ZESTRIL) 20 MG tablet TAKE 1/2 TABLET BY MOUTH DAILY. 30 tablet 3  . metFORMIN (GLUCOPHAGE) 500 MG tablet Take 500 mg by mouth 2 (two) times daily with a meal.     . NITROSTAT 0.4 MG SL tablet PLACE 1 TABLET (0.4 MG TOTAL) UNDER THE TONGUE EVERY 5 (FIVE) MINUTES AS NEEDED FOR CHEST PAIN. 25 tablet 6  . Omega-3 Fatty Acids (RA FISH OIL) 1400 MG CPDR Take 1,400 mg by mouth 2 (two) times daily.    Marland Kitchen. spironolactone (ALDACTONE) 25 MG tablet Take 1/2 tablet by mouth every morning. 30 tablet 6   No  current facility-administered medications for this visit.    Allergies  Allergen Reactions  . Statins Other (See Comments)    Myalgias   . Zetia [Ezetimibe]     myalgias    Review of Systems negative except from HPI and PMH  Physical Exam BP 100/60 mmHg  Pulse 86  Ht 6' (1.829 m)  Wt 218 lb 6.4 oz (99.066 kg)  BMI 29.61 kg/m2 Well developed and well nourished in no acute distress HENT normal E scleral and icterus clear Neck Supple JVP flat; carotids brisk and full Clear to ausculation  Device pocket well healed; without hematoma or erythema.  There is no tethering Regular rate and rhythm, no murmurs gallops or rub Soft with active bowel sounds No clubbing cyanosis none Edema Alert and oriented, grossly normal motor and sensory function Skin Warm and Dry  ECG demonstrates P. synchronous pacing with a narrow QRS 100 ms  Assessment and  Plan  Ischemic cardiomyopathy  CRT-Medtronic approaching ERI  Congestive heart failure-chronic systolic  High output LV capture threshold  Euvolemic continue current meds   there have been no significant interval change in functional capacity with the LV lead turned off. We will replace his generator as he has reached ERI.  We have reviewed the benefits and risks of generator replacement.  These include but are not limited to lead fracture and infection.  The patient understands, agrees and is willing to proceed.

## 2014-05-26 NOTE — Patient Instructions (Addendum)
Your physician recommends that you continue on your current medications as directed. Please refer to the Current Medication list given to you today.  Your physician recommends that you return for pre-procedure lab work on: 06/10/14  Your physician has recommended that you have a defibrillator generator change. Please see the instruction sheet given to you today for more information.  Your wound check is scheduled for 06/22/14 at 9:00 a.m.  at [address].

## 2014-06-08 ENCOUNTER — Encounter: Payer: Medicare Other | Admitting: Internal Medicine

## 2014-06-10 ENCOUNTER — Encounter: Payer: Self-pay | Admitting: Internal Medicine

## 2014-06-10 ENCOUNTER — Other Ambulatory Visit: Payer: Medicare Other

## 2014-06-11 DIAGNOSIS — E119 Type 2 diabetes mellitus without complications: Secondary | ICD-10-CM | POA: Diagnosis not present

## 2014-06-11 DIAGNOSIS — Z8701 Personal history of pneumonia (recurrent): Secondary | ICD-10-CM | POA: Diagnosis not present

## 2014-06-11 DIAGNOSIS — I4510 Unspecified right bundle-branch block: Secondary | ICD-10-CM | POA: Diagnosis not present

## 2014-06-11 DIAGNOSIS — Z96611 Presence of right artificial shoulder joint: Secondary | ICD-10-CM | POA: Diagnosis not present

## 2014-06-11 DIAGNOSIS — Z888 Allergy status to other drugs, medicaments and biological substances status: Secondary | ICD-10-CM | POA: Diagnosis not present

## 2014-06-11 DIAGNOSIS — I25119 Atherosclerotic heart disease of native coronary artery with unspecified angina pectoris: Secondary | ICD-10-CM | POA: Diagnosis not present

## 2014-06-11 DIAGNOSIS — Z7982 Long term (current) use of aspirin: Secondary | ICD-10-CM | POA: Diagnosis not present

## 2014-06-11 DIAGNOSIS — Z4502 Encounter for adjustment and management of automatic implantable cardiac defibrillator: Secondary | ICD-10-CM | POA: Diagnosis present

## 2014-06-11 DIAGNOSIS — Z951 Presence of aortocoronary bypass graft: Secondary | ICD-10-CM | POA: Diagnosis not present

## 2014-06-11 DIAGNOSIS — G4733 Obstructive sleep apnea (adult) (pediatric): Secondary | ICD-10-CM | POA: Diagnosis not present

## 2014-06-11 DIAGNOSIS — I255 Ischemic cardiomyopathy: Secondary | ICD-10-CM | POA: Diagnosis not present

## 2014-06-11 DIAGNOSIS — E785 Hyperlipidemia, unspecified: Secondary | ICD-10-CM | POA: Diagnosis not present

## 2014-06-11 DIAGNOSIS — Z96612 Presence of left artificial shoulder joint: Secondary | ICD-10-CM | POA: Diagnosis not present

## 2014-06-11 DIAGNOSIS — I252 Old myocardial infarction: Secondary | ICD-10-CM | POA: Diagnosis not present

## 2014-06-11 DIAGNOSIS — I480 Paroxysmal atrial fibrillation: Secondary | ICD-10-CM | POA: Diagnosis not present

## 2014-06-11 DIAGNOSIS — I509 Heart failure, unspecified: Secondary | ICD-10-CM | POA: Diagnosis not present

## 2014-06-11 MED ORDER — SODIUM CHLORIDE 0.9 % IV SOLN
INTRAVENOUS | Status: DC
  Administered 2014-06-12: 06:00:00 via INTRAVENOUS

## 2014-06-11 MED ORDER — SODIUM CHLORIDE 0.9 % IR SOLN
80.0000 mg | Status: AC
  Filled 2014-06-11: qty 2

## 2014-06-11 MED ORDER — MUPIROCIN 2 % EX OINT
1.0000 "application " | TOPICAL_OINTMENT | Freq: Once | CUTANEOUS | Status: AC
  Administered 2014-06-12: 1 via TOPICAL
  Filled 2014-06-11: qty 22

## 2014-06-11 MED ORDER — CEFAZOLIN SODIUM-DEXTROSE 2-3 GM-% IV SOLR: 2.0000 g | INTRAVENOUS | Status: AC

## 2014-06-12 ENCOUNTER — Ambulatory Visit (HOSPITAL_COMMUNITY)
Admission: RE | Admit: 2014-06-12 | Discharge: 2014-06-12 | Disposition: A | Discharge status: Home / Self Care | Payer: Medicare Other | Source: Ambulatory Visit | Attending: Internal Medicine | Admitting: Internal Medicine

## 2014-06-12 ENCOUNTER — Encounter (HOSPITAL_COMMUNITY): Payer: Self-pay | Admitting: Internal Medicine

## 2014-06-12 ENCOUNTER — Encounter (HOSPITAL_COMMUNITY)
Admission: RE | Disposition: A | Discharge status: Home / Self Care | Payer: Self-pay | Source: Ambulatory Visit | Attending: Internal Medicine

## 2014-06-12 DIAGNOSIS — I255 Ischemic cardiomyopathy: Secondary | ICD-10-CM

## 2014-06-12 DIAGNOSIS — I252 Old myocardial infarction: Secondary | ICD-10-CM | POA: Insufficient documentation

## 2014-06-12 DIAGNOSIS — E119 Type 2 diabetes mellitus without complications: Secondary | ICD-10-CM | POA: Insufficient documentation

## 2014-06-12 DIAGNOSIS — Z888 Allergy status to other drugs, medicaments and biological substances status: Secondary | ICD-10-CM | POA: Insufficient documentation

## 2014-06-12 DIAGNOSIS — Z7982 Long term (current) use of aspirin: Secondary | ICD-10-CM | POA: Insufficient documentation

## 2014-06-12 DIAGNOSIS — I25119 Atherosclerotic heart disease of native coronary artery with unspecified angina pectoris: Secondary | ICD-10-CM | POA: Insufficient documentation

## 2014-06-12 DIAGNOSIS — E785 Hyperlipidemia, unspecified: Secondary | ICD-10-CM | POA: Insufficient documentation

## 2014-06-12 DIAGNOSIS — Z951 Presence of aortocoronary bypass graft: Secondary | ICD-10-CM | POA: Insufficient documentation

## 2014-06-12 DIAGNOSIS — Z96612 Presence of left artificial shoulder joint: Secondary | ICD-10-CM | POA: Insufficient documentation

## 2014-06-12 DIAGNOSIS — I5022 Chronic systolic (congestive) heart failure: Secondary | ICD-10-CM

## 2014-06-12 DIAGNOSIS — I509 Heart failure, unspecified: Secondary | ICD-10-CM | POA: Insufficient documentation

## 2014-06-12 DIAGNOSIS — I480 Paroxysmal atrial fibrillation: Secondary | ICD-10-CM | POA: Insufficient documentation

## 2014-06-12 DIAGNOSIS — I4510 Unspecified right bundle-branch block: Secondary | ICD-10-CM | POA: Insufficient documentation

## 2014-06-12 DIAGNOSIS — Z4502 Encounter for adjustment and management of automatic implantable cardiac defibrillator: Secondary | ICD-10-CM | POA: Insufficient documentation

## 2014-06-12 DIAGNOSIS — Z8701 Personal history of pneumonia (recurrent): Secondary | ICD-10-CM | POA: Insufficient documentation

## 2014-06-12 DIAGNOSIS — Z96611 Presence of right artificial shoulder joint: Secondary | ICD-10-CM | POA: Insufficient documentation

## 2014-06-12 DIAGNOSIS — G4733 Obstructive sleep apnea (adult) (pediatric): Secondary | ICD-10-CM | POA: Insufficient documentation

## 2014-06-12 HISTORY — PX: BIV ICD GENERTAOR CHANGE OUT: SHX5745

## 2014-06-12 LAB — SURGICAL PCR SCREEN
MRSA, PCR: NEGATIVE
Staphylococcus aureus: NEGATIVE

## 2014-06-12 LAB — GLUCOSE, CAPILLARY: Glucose-Capillary: 152 mg/dL — ABNORMAL HIGH (ref 70–99)

## 2014-06-12 SURGERY — BIV ICD GENERTAOR CHANGE OUT
Anesthesia: LOCAL

## 2014-06-12 MED ORDER — LIDOCAINE HCL (PF) 1 % IJ SOLN
INTRAMUSCULAR | Status: AC
  Filled 2014-06-12: qty 30

## 2014-06-12 MED ORDER — MUPIROCIN 2 % EX OINT
TOPICAL_OINTMENT | CUTANEOUS | Status: AC
  Administered 2014-06-12: 1 via TOPICAL
  Filled 2014-06-12: qty 22

## 2014-06-12 MED ORDER — ACETAMINOPHEN 325 MG PO TABS
325.0000 mg | ORAL_TABLET | ORAL | Status: DC | PRN
  Filled 2014-06-12: qty 2

## 2014-06-12 MED ORDER — SODIUM CHLORIDE 0.9 % IV SOLN: INTRAVENOUS | Status: AC

## 2014-06-12 MED ORDER — ONDANSETRON HCL 4 MG/2ML IJ SOLN: 4.0000 mg | Freq: Four times a day (QID) | INTRAMUSCULAR | Status: DC | PRN

## 2014-06-12 MED ORDER — MIDAZOLAM HCL 5 MG/5ML IJ SOLN
INTRAMUSCULAR | Status: AC
  Filled 2014-06-12: qty 5

## 2014-06-12 MED ORDER — FENTANYL CITRATE 0.05 MG/ML IJ SOLN
INTRAMUSCULAR | Status: AC
  Filled 2014-06-12: qty 2

## 2014-06-12 NOTE — Discharge Instructions (Signed)
Pacemaker Battery Change, Care After °Refer to this sheet in the next few weeks. These instructions provide you with information on caring for yourself after your procedure. Your health care provider may also give you more specific instructions. Your treatment has been planned according to current medical practices, but problems sometimes occur. Call your health care provider if you have any problems or questions after your procedure. °WHAT TO EXPECT AFTER THE PROCEDURE °After your procedure, it is typical to have the following sensations: °· Soreness at the pacemaker site. °HOME CARE INSTRUCTIONS  °· Keep the incision clean and dry. °· Unless advised otherwise, you may shower beginning 48 hours after your procedure. °· For the first week after the replacement, avoid stretching motions that pull at the incision site, and avoid heavy exercise with the arm that is on the same side as the incision. °· Take medicines only as directed by your health care provider. °· Keep all follow-up visits as directed by your health care provider. °SEEK MEDICAL CARE IF:  °· You have pain at the incision site that is not relieved by over-the-counter or prescription medicine. °· There is drainage or pus from the incision site. °· There is swelling larger than a lime at the incision site. °· You develop red streaking that extends above or below the incision site. °· You feel brief, intermittent palpitations, light-headedness, or any symptoms that you feel might be related to your heart. °SEEK IMMEDIATE MEDICAL CARE IF:  °· You experience chest pain that is different than the pain at the pacemaker site. °· You experience shortness of breath. °· You have palpitations or irregular heartbeat. °· You have light-headedness that does not go away quickly. °· You faint. °· You have pain that gets worse and is not relieved by medicine. °Document Released: 01/15/2013 Document Revised: 08/11/2013 Document Reviewed: 01/15/2013 °Exit[employer]® Patient  Information ©2015 [employer]. This information is not intended to replace advice given to you by your health care provider. Make sure you discuss any questions you have with your health care provider. ° °

## 2014-06-12 NOTE — Interval H&P Note (Signed)
ICD Criteria  Current LVEF:45% ;Obtained > 6 months ago.   NYHA Functional Classification: Class II  Heart Failure History:  Yes, Duration of heart failure since onset is > 9 months  Non-Ischemic Dilated Cardiomyopathy History:  No.  Atrial Fibrillation/Atrial Flutter:  No.  Ventricular Tachycardia History:  No.  Cardiac Arrest History:  No  History of Syndromes with Risk of Sudden Death:  No.  Previous ICD:  Yes, ICD Type:  CRT-D, Reason for ICD:  Primary prevention.  LVEF is not available  Electrophysiology Study: No.  Prior MI: Yes, Most recent MI timeframe is > 40 days.  PPM: No.  OSA:  Yes  Patient Life Expectancy of >=1 year: Yes.  Anticoagulation Therapy:  Patient is NOT on anticoagulation therapy.   Beta Blocker Therapy:  Yes.   Ace Inhibitor/ARB Therapy:  Yes.History and Physical Interval Note:  06/12/2014 7:18 AM  Eliott NineLeslie A Broaden  has presented today for surgery, with the diagnosis of eri  The various methods of treatment have been discussed with the patient and family. After consideration of risks, benefits and other options for treatment, the patient has consented to  Procedure(s): BIV ICD GENERTAOR CHANGE OUT (N/A) as a surgical intervention .  The patient's history has been reviewed, patient examined, no change in status, stable for surgery.  I have reviewed the patient's chart and labs.  Questions were answered to the patient's satisfaction.     Sherryl MangesSteven Nitzia Perren

## 2014-06-12 NOTE — H&P (View-Only) (Signed)
.      Patient Care Team: Richard A Aronson, MD as PCP - General (Internal Medicine)   HPI  [name] is a 58 y.o. male Seen in followup for an CRT-D implanted for primary prevention April 2013 for ischemic cardiomyopathy and prior myocardial infarction and right bundle branch block.   He is modestly improved following CRT. He is less winded. He still develops fatigue.  He has daytime somnolence and obstructive sleep apnea. He's had a sleep study but is not inclined to CPAP therapy.  There is been no significant change in functional status following the off programming of his LV lead    He's had no chest pain edema. There've been no intercurrent ICD discharges   Past Medical History  Diagnosis Date  . Ischemic cardiomyopathy     EF 36%; prior anterior MI, s/p CABG x 3; s/p cath Feb 2013 showing grafts to be patent with severe LV dysfunction  . Chronic systolic heart failure     class 2-3  . Hyperlipidemia   . Hemochromatosis   . RBBB (right bundle branch block)   . Rotator cuff injury   . PAF (paroxysmal atrial fibrillation)   . Anemia   . AMI anterior wall 2006  . CHF (congestive heart failure)   . Angina     "upon exertion"  . Pneumonia ~ 2001    "chemical; from smelling chlorox"  . Exertional dyspnea   . Type II diabetes mellitus   . ICD (implantable cardiac defibrillator) in place   . Pacemaker   . ICD-Medtronic-CRT 08/24/2011  . Sleep apnea     no cpap  . Coronary artery disease     Past Surgical History  Procedure Laterality Date  . Cardiac catheterization  03/12/2005    INFERIOR WALL AKINESIA. THERE IS GLOBAL HYPOKINESIA WITH OVERALL MODERATE TO SEVERE LEFT VENTRICULAR SYSTOLIC DYSFUNCTION. EF 35%  . Coronary artery bypass graft  2006    Emergent CABG x 3 with LIMA to LAD, SVG to OM, SVG to distal RCA per Dr. Gerhardt  . Rotator cuff repair  ?2000/~ 2007    left; right   . Cardiac catheterization  Feb 2013    Grafts patent. EF is 20 to 25%.  .  Cardiac defibrillator placement  08/21/11    "pacer/defib"  . Insert / replace / remove pacemaker    . Joint replacement      bil shoulders  . Inguinal hernia repair  02/09/2012    WITH MESH  . Inguinal hernia repair  02/09/2012    Procedure: HERNIA REPAIR INGUINAL ADULT;  Surgeon: James O Wyatt, MD;  Location: [facility] OR;  Service: General;  Laterality: Right;  . Insertion of mesh  02/09/2012    Procedure: INSERTION OF MESH;  Surgeon: James O Wyatt, MD;  Location: [facility] OR;  Service: General;  Laterality: Right;  . Left heart catheterization with coronary/graft angiogram N/A 05/25/2011    Procedure: LEFT HEART CATHETERIZATION WITH CORONARY/GRAFT ANGIOGRAM;  Surgeon: Peter M Jordan, MD;  Location: [facility] CATH LAB;  Service: Cardiovascular;  Laterality: N/A;  . Bi-ventricular implantable cardioverter defibrillator N/A 08/21/2011    Procedure: BI-VENTRICULAR IMPLANTABLE CARDIOVERTER DEFIBRILLATOR  (CRT-D);  Surgeon: Steven C Klein, MD;  Location: [facility] CATH LAB;  Service: Cardiovascular;  Laterality: N/A;    Current Outpatient Prescriptions  Medication Sig Dispense Refill  . aspirin 81 MG tablet Take 81 mg by mouth daily.    . carvedilol (COREG) 25 MG tablet Take 1/2 tablet by mouth every   morning and 1 tablet by mouth every evening. 45 tablet 6  . fenofibrate 160 MG tablet TAKE 1 TABLET BY MOUTH DAILY. 30 tablet 3  . furosemide (LASIX) 20 MG tablet TAKE 1/2 TABLET BY MOUTH DAILY. 30 tablet 3  . lisinopril (PRINIVIL,ZESTRIL) 20 MG tablet TAKE 1/2 TABLET BY MOUTH DAILY. 30 tablet 3  . metFORMIN (GLUCOPHAGE) 500 MG tablet Take 500 mg by mouth 2 (two) times daily with a meal.     . NITROSTAT 0.4 MG SL tablet PLACE 1 TABLET (0.4 MG TOTAL) UNDER THE TONGUE EVERY 5 (FIVE) MINUTES AS NEEDED FOR CHEST PAIN. 25 tablet 6  . Omega-3 Fatty Acids (RA FISH OIL) 1400 MG CPDR Take 1,400 mg by mouth 2 (two) times daily.    . spironolactone (ALDACTONE) 25 MG tablet Take 1/2 tablet by mouth every morning. 30 tablet 6   No  current facility-administered medications for this visit.    Allergies  Allergen Reactions  . Statins Other (See Comments)    Myalgias   . Zetia [Ezetimibe]     myalgias    Review of Systems negative except from HPI and PMH  Physical Exam BP 100/60 mmHg  Pulse 86  Ht 6' (1.829 m)  Wt 218 lb 6.4 oz (99.066 kg)  BMI 29.61 kg/m2 Well developed and well nourished in no acute distress HENT normal E scleral and icterus clear Neck Supple JVP flat; carotids brisk and full Clear to ausculation  Device pocket well healed; without hematoma or erythema.  There is no tethering Regular rate and rhythm, no murmurs gallops or rub Soft with active bowel sounds No clubbing cyanosis none Edema Alert and oriented, grossly normal motor and sensory function Skin Warm and Dry  ECG demonstrates P. synchronous pacing with a narrow QRS 100 ms  Assessment and  Plan  Ischemic cardiomyopathy  CRT-Medtronic approaching ERI  Congestive heart failure-chronic systolic  High output LV capture threshold  Euvolemic continue current meds   there have been no significant interval change in functional capacity with the LV lead turned off. We will replace his generator as he has reached ERI.  We have reviewed the benefits and risks of generator replacement.  These include but are not limited to lead fracture and infection.  The patient understands, agrees and is willing to proceed.    

## 2014-06-12 NOTE — CV Procedure (Signed)
Preoperative diagnosis  [facility]schemic [facility]/   Procedure: Generator replacement; Assessment of HV leads  ;   Following informed consent the patient was brought to the electrophysiology laboratory in place of the fluoroscopic table in the supine position after routine prep and drape lidocaine was infiltrated in the region of the previous incision and carried down to later the device pocket using sharp dissection and electrocautery. The pocket was opened the device was freed up and was explanted.  Interrogation of the previously implanted ICD ventricular lead [employer]  [phone]  demonstrated an R wave of 9.1  millivolts., and impedance of 748 ohms, and a pacing threshold of 0.5 volts at 0.5 msec.     Interrogation of the previously implanted Left ventricular lead  Was not in use and not assessed   The previously implanted atrial lead Medtronic [id number] demonstrated a P-wave amplitude of  2.1 milllivolts and impedance of  486 ohms, and a pacing threshold of  0.6 volts at @ 0.565milliseconds.  The leads were inspected. Repair was not  needed. The leads were then attached to a Medtronic  pulse generator, serial number [id number].    Through the device the P-wave amplitude  Was  2.8 milllivolts and impedance of  456 ohms, and a pacing threshold of  0.5 volts at @ 0.374milliseconds; the ICD ventricular lead demonstrated an R wave of  11  millivolts., and impedance of 513 ohms, and a pacing threshold of  0.75 volts at 0.4 msec    High voltage impedances were 73 ohms  The pocket was irrigated with antibiotic containing saline solution hemostasis was assured and the leads and the device were placed in the pocket. The wound was then closed in 2 layers in normal fashion.  The patient tolerated the procedure without apparent complication.  DFT testing was not performed  EBL Minimal   Sherryl MangesSteven Dalis Beers   \

## 2014-06-18 ENCOUNTER — Ambulatory Visit (INDEPENDENT_AMBULATORY_CARE_PROVIDER_SITE_OTHER): Payer: Medicare Other | Admitting: Cardiology

## 2014-06-18 ENCOUNTER — Encounter: Payer: Self-pay | Admitting: Cardiology

## 2014-06-18 VITALS — BP 102/70 | HR 76 | Ht 73.0 in | Wt 215.5 lb

## 2014-06-18 DIAGNOSIS — I4510 Unspecified right bundle-branch block: Secondary | ICD-10-CM

## 2014-06-18 DIAGNOSIS — E785 Hyperlipidemia, unspecified: Secondary | ICD-10-CM

## 2014-06-18 DIAGNOSIS — I25810 Atherosclerosis of coronary artery bypass graft(s) without angina pectoris: Secondary | ICD-10-CM | POA: Diagnosis not present

## 2014-06-18 DIAGNOSIS — I5022 Chronic systolic (congestive) heart failure: Secondary | ICD-10-CM | POA: Diagnosis not present

## 2014-06-18 MED ORDER — ROSUVASTATIN CALCIUM 5 MG PO TABS: ORAL_TABLET | ORAL | Status: DC

## 2014-06-18 NOTE — Patient Instructions (Addendum)
We will schedule you for an Echocardiogram  Continue your current therapy  I will see you in  6 months.  We will try you on Crestor 5 mg three days a week- if tolerated I would like to repeat your lipid levels in 3 months.

## 2014-06-18 NOTE — Progress Notes (Signed)
[name] Date of Birth: [birth year] Medical Record #[id number]  History of Present Illness: [name] is seen for follow up CAD and CHF.  He has an ischemic CM and has CRT-D in place for primary prevention since April of 2013. EF by Echo was 45-50% at that time. Cardiac MRI showed an EF of 31%. Other issues include HLD (statin intolerant and intolerant to zetia), hemochromatosis, RBBB, PAF, type 2 DM and OSA. He is s/p CABG in 2006 following an MI. Cardiac cath in February 2013 showed patrent grafts. After CRT therapy he did have a significant improvement in dyspnea. Recently he had generator change out without complication. Today he denies any chest pain or palpiations. He still has dyspnea if he overexerts but is able to do yard work. His weight is down 3 lbs. He is intolerant of niacin, statins (Simcor and Vytorin) and Zetia.    Current Outpatient Prescriptions  Medication Sig Dispense Refill  . aspirin 81 MG tablet Take 81 mg by mouth daily.    . carvedilol (COREG) 25 MG tablet Take 1/2 tablet by mouth every morning and 1 tablet by mouth every evening. 45 tablet 6  . fenofibrate 160 MG tablet TAKE 1 TABLET BY MOUTH DAILY. 30 tablet 3  . furosemide (LASIX) 20 MG tablet TAKE 1/2 TABLET BY MOUTH DAILY. 30 tablet 3  . lisinopril (PRINIVIL,ZESTRIL) 20 MG tablet TAKE 1/2 TABLET BY MOUTH DAILY. 30 tablet 3  . metFORMIN (GLUCOPHAGE) 500 MG tablet Take 1,000 mg by mouth 2 (two) times daily with a meal.     . NITROSTAT 0.4 MG SL tablet PLACE 1 TABLET (0.4 MG TOTAL) UNDER THE TONGUE EVERY 5 (FIVE) MINUTES AS NEEDED FOR CHEST PAIN. 25 tablet 6  . Omega-3 Fatty Acids (RA FISH OIL) 1400 MG CPDR Take 1,400 mg by mouth 2 (two) times daily.    Marland Kitchen. spironolactone (ALDACTONE) 25 MG tablet Take 1/2 tablet by mouth every morning. 30 tablet 6  . rosuvastatin (CRESTOR) 5 MG tablet Take 5 mg 3 times a week  Mon-Wed-Fri 30 tablet 6   No current facility-administered medications for this visit.    Allergies    Allergen Reactions  . Statins Other (See Comments)    Myalgias   . Zetia [Ezetimibe]     myalgias    Past Medical History  Diagnosis Date  . Ischemic cardiomyopathy     EF 36%; prior anterior MI, s/p CABG x 3; s/p cath Feb 2013 showing grafts to be patent with severe LV dysfunction  . Chronic systolic heart failure     class 2-3  . Hyperlipidemia   . Hemochromatosis   . RBBB (right bundle branch block)   . Rotator cuff injury   . PAF (paroxysmal atrial fibrillation)   . Anemia   . AMI anterior wall 2006  . CHF (congestive heart failure)   . Angina     "upon exertion"  . Pneumonia ~ 2001    "chemical; from smelling chlorox"  . Exertional dyspnea   . Type II diabetes mellitus   . ICD (implantable cardiac defibrillator) in place   . Pacemaker   . ICD-Medtronic-CRT 08/24/2011  . Sleep apnea     no cpap  . Coronary artery disease     Past Surgical History  Procedure Laterality Date  . Cardiac catheterization  03/12/2005    INFERIOR WALL AKINESIA. THERE IS GLOBAL HYPOKINESIA WITH OVERALL MODERATE TO SEVERE LEFT VENTRICULAR SYSTOLIC DYSFUNCTION. EF 35%  . Coronary artery bypass graft  2006    Emergent CABG x 3 with LIMA to LAD, SVG to OM, SVG to distal RCA per Dr. Tyrone Sage  . Rotator cuff repair  ?2000/~ 2007    left; right   . Cardiac catheterization  Feb 2013    Grafts patent. EF is 20 to 25%.  . Cardiac defibrillator placement  08/21/11    "pacer/defib"  . Insert / replace / remove pacemaker    . Joint replacement      bil shoulders  . Inguinal hernia repair  02/09/2012    WITH MESH  . Inguinal hernia repair  02/09/2012    Procedure: HERNIA REPAIR INGUINAL ADULT;  Surgeon: Cherylynn Ridges, MD;  Location: [facility] OR;  Service: General;  Laterality: Right;  . Insertion of mesh  02/09/2012    Procedure: INSERTION OF MESH;  Surgeon: Cherylynn Ridges, MD;  Location: [facility] OR;  Service: General;  Laterality: Right;  . Left heart catheterization with coronary/graft angiogram N/A  05/25/2011    Procedure: LEFT HEART CATHETERIZATION WITH Isabel Caprice;  Surgeon: Lanny Lipkin M [place], MD;  Location: [facility] CATH LAB;  Service: Cardiovascular;  Laterality: N/A;  . Bi-ventricular implantable cardioverter defibrillator N/A 08/21/2011    Procedure: BI-VENTRICULAR IMPLANTABLE CARDIOVERTER DEFIBRILLATOR  (CRT-D);  Surgeon: Duke Salvia, MD;  Location: [facility] CATH LAB;  Service: Cardiovascular;  Laterality: N/A;  . Biv icd genertaor change out N/A 06/12/2014    Procedure: BIV ICD GENERTAOR CHANGE OUT;  Surgeon: Duke Salvia, MD;  Location: [facility] CATH LAB;  Service: Cardiovascular;  Laterality: N/A;    History  Smoking status  . Former Smoker -- 2.00 packs/day for 30 years  . Types: Cigarettes  . Quit date: 03/11/2005  Smokeless tobacco  . Never Used    History  Alcohol Use  . 1.8 oz/week  . 3 Cans of beer per week    Comment: weekly    Family History  Problem Relation Age of Onset  . Adopted: Yes    Review of Systems: The review of systems is per the HPI.  All other systems were reviewed and are negative.  Physical Exam: BP 102/70 mmHg  Pulse 76  Ht  (1.854 m)  Wt 215 lb 8 oz (97.75 kg)  BMI 28.44 kg/m2 Patient is very pleasant and in no acute distress. Skin is warm and dry. Color is normal.  HEENT is unremarkable. Normocephalic/atraumatic. PERRL. Sclera are nonicteric. Neck is supple. No masses. No JVD. Lungs are clear. Cardiac exam shows a regular rate and rhythm. Normal S1-2. No gallop or murmur. ICD site is healing well. Abdomen is soft. Extremities are without edema. Gait and ROM are intact. No gross neurologic deficits noted.  Wt Readings from Last 3 Encounters:  06/18/14 215 lb 8 oz (97.75 kg)  05/26/14 218 lb 6.4 oz (99.066 kg)  02/26/14 216 lb (97.977 kg)   LABORATORY DATA:  PENDING  Lab Results  Component Value Date   WBC 4.4 02/01/2012   HGB 12.3* 02/01/2012   HCT 35.8* 02/01/2012   PLT 245 02/01/2012   GLUCOSE 284* 04/14/2013   CHOL 269*  04/14/2013   TRIG 217.0* 04/14/2013   HDL 35.20* 04/14/2013   LDLDIRECT 206.0 04/14/2013   LDLCALC 116* 01/13/2011   ALT 23 04/14/2013   AST 17 04/14/2013   NA 134* 04/14/2013   K 4.6 04/14/2013   CL 100 04/14/2013   CREATININE 1.7* 04/14/2013   BUN 24* 04/14/2013   CO2 25 04/14/2013   INR 0.9 08/16/2011  HGBA1C 6.0 10/06/2010     Assessment / Plan: 1. Chronic systolic CHF status post CRT. Clinically improved with biventricular ICD pacing. Currently class  II. He does not appear to be volume overloaded on exam today - weight is actually down. Will leave him on his current regimen. I have recommended a follow up Echo since this has not been reassessed since CRT in place.   2. Ischemic cardiomyopathy.   3. Coronary disease status post CABG in 2006. Cardiac catheterization in February 2013 showed all grafts were patent. Clinically without chest pain.  4. Right bundle branch block.   5. Hyperlipidemia. Patient is intolerant to statins. He is on fenofibrate only. Will try low dose statin with Crestor 5 mg three times a week. If unable to tolerate may need to consider PCSK9 inhibitor. Will repeat lipids in 3 months with chemistries.   I will follow up in 6 months.

## 2014-06-22 ENCOUNTER — Ambulatory Visit (INDEPENDENT_AMBULATORY_CARE_PROVIDER_SITE_OTHER): Payer: Medicare Other | Admitting: *Deleted

## 2014-06-22 DIAGNOSIS — I480 Paroxysmal atrial fibrillation: Secondary | ICD-10-CM

## 2014-06-22 DIAGNOSIS — I255 Ischemic cardiomyopathy: Secondary | ICD-10-CM

## 2014-06-22 DIAGNOSIS — I5022 Chronic systolic (congestive) heart failure: Secondary | ICD-10-CM

## 2014-06-22 LAB — MDC_IDC_ENUM_SESS_TYPE_INCLINIC
Battery Remaining Longevity: 9.2
Brady Statistic AP VP Percent: 0.1 % — CL
Brady Statistic AP VS Percent: 0.1 % — CL
Brady Statistic AS VP Percent: 99.9 %
HIGH POWER IMPEDANCE MEASURED VALUE: 58 Ohm
Lead Channel Impedance Value: 494 Ohm
Lead Channel Impedance Value: 589 Ohm
Lead Channel Pacing Threshold Amplitude: 0.75 V
Lead Channel Pacing Threshold Amplitude: 0.75 V
Lead Channel Sensing Intrinsic Amplitude: 1.4 mV
Lead Channel Setting Pacing Amplitude: 2 V
Lead Channel Setting Pacing Amplitude: 2.5 V
Lead Channel Setting Pacing Pulse Width: 0.4 ms
Lead Channel Setting Sensing Sensitivity: 0.45 mV
MDC IDC MSMT LEADCHNL RA PACING THRESHOLD PULSEWIDTH: 0.4 ms
MDC IDC MSMT LEADCHNL RV IMPEDANCE VALUE: 570 Ohm
MDC IDC MSMT LEADCHNL RV PACING THRESHOLD PULSEWIDTH: 0.4 ms
MDC IDC MSMT LEADCHNL RV SENSING INTR AMPL: 11.6 mV
MDC IDC SET ZONE DETECTION INTERVAL: 250 ms
MDC IDC SET ZONE DETECTION INTERVAL: 400 ms
MDC IDC STAT BRADY AS VS PERCENT: 0.1 %
Zone Setting Detection Interval: 300 ms
Zone Setting Detection Interval: 350 ms

## 2014-06-22 NOTE — Progress Notes (Signed)
Wound check appointment. Wound without redness or edema. Incision edges approximated, wound well healed. Normal device function. RA/RV thresholds, sensing, and impedances consistent with implant measurements. LV threshold test was not performed per pt request---lead is not in use. Device programmed at appropriate safety margins. Histogram distribution appropriate for patient and level of activity. No mode switches or ventricular arrhythmias noted.  (2) "VS" episodes---max dur. 7 sec, Max V 154---NSVT. Patient educated about wound care, arm mobility, and shock plan. ROV in 3 months with SK.

## 2014-07-01 ENCOUNTER — Ambulatory Visit (HOSPITAL_COMMUNITY)
Admission: RE | Admit: 2014-07-01 | Discharge: 2014-07-01 | Disposition: A | Discharge status: Home / Self Care | Payer: Medicare Other | Source: Ambulatory Visit | Attending: Cardiology | Admitting: Cardiology

## 2014-07-01 ENCOUNTER — Telehealth: Payer: Self-pay | Admitting: *Deleted

## 2014-07-01 DIAGNOSIS — I4510 Unspecified right bundle-branch block: Secondary | ICD-10-CM | POA: Insufficient documentation

## 2014-07-01 DIAGNOSIS — E785 Hyperlipidemia, unspecified: Secondary | ICD-10-CM | POA: Diagnosis not present

## 2014-07-01 DIAGNOSIS — I25810 Atherosclerosis of coronary artery bypass graft(s) without angina pectoris: Secondary | ICD-10-CM | POA: Diagnosis not present

## 2014-07-01 DIAGNOSIS — I5022 Chronic systolic (congestive) heart failure: Secondary | ICD-10-CM | POA: Diagnosis not present

## 2014-07-01 NOTE — Telephone Encounter (Signed)
Pt in for US appt, stopped by to discuss meds.  Pt explained that at recent visit, Dr. SwazilandJordan wanted to trial him on low-dose Crestor 3 times a week & check lipids 3 months out.   He has been unable to afford crestor ($284 deductible) and due to historic intolerance to statins, wanted best advice.  I stated that would be best to proceed w/ trial of Crestor but since he cites issue of affordability, fixed income, and no current employment, difficulty is acknowledged.  Since no Crestor samples in stock, I set a note for myself to f/u w/ patient when we get resupplied & can provide samples to pt. Communicated this to pt.

## 2014-07-01 NOTE — Progress Notes (Signed)
2D Echocardiogram Complete.  07/01/2014   Merideth Bosque, RDCS  

## 2014-07-02 ENCOUNTER — Telehealth: Payer: Self-pay | Admitting: *Deleted

## 2014-07-02 MED ORDER — ROSUVASTATIN CALCIUM 5 MG PO TABS: ORAL_TABLET | ORAL | Status: DC

## 2014-07-02 NOTE — Telephone Encounter (Signed)
Discussed w/ Belenda CruiseKristin - she had Crestor samples on hand, offered sample box - instructed me to provide for patient. If patient has no SE's/tolerating SE's w/ med, can supply further to get him covered for 3 months or until med goes generic.  Spoke w/ this patient - provided sample box of Crestor & informed him available at front desk. Gave instructions, pt voiced understanding. Told to contact for any q's.

## 2014-07-09 ENCOUNTER — Encounter: Payer: Self-pay | Admitting: Internal Medicine

## 2014-07-16 ENCOUNTER — Other Ambulatory Visit: Payer: Self-pay | Admitting: *Deleted

## 2014-07-16 MED ORDER — ROSUVASTATIN CALCIUM 5 MG PO TABS: ORAL_TABLET | ORAL | Status: DC

## 2014-07-16 NOTE — Telephone Encounter (Signed)
Re: statin therapy / sample supply  Had left a note for myself to call pt back & see if tolerating initial Crestor. Followed up w/ patient. He reports no issues taking med. Informed him I would leave samples at front desk for him to finish 3 month 'trial' on statin. Reminded him to do lab draw at end of 3 months. Pt voiced understanding.

## 2014-08-28 ENCOUNTER — Other Ambulatory Visit: Payer: Self-pay | Admitting: Cardiology

## 2014-08-28 NOTE — Telephone Encounter (Signed)
Per note 3.10.16 

## 2014-09-24 ENCOUNTER — Ambulatory Visit (INDEPENDENT_AMBULATORY_CARE_PROVIDER_SITE_OTHER): Payer: Medicare Other | Admitting: Internal Medicine

## 2014-09-24 ENCOUNTER — Encounter: Payer: Self-pay | Admitting: Internal Medicine

## 2014-09-24 VITALS — BP 98/54 | HR 80 | Ht 72.0 in | Wt 216.2 lb

## 2014-09-24 DIAGNOSIS — I5022 Chronic systolic (congestive) heart failure: Secondary | ICD-10-CM

## 2014-09-24 DIAGNOSIS — I480 Paroxysmal atrial fibrillation: Secondary | ICD-10-CM | POA: Diagnosis not present

## 2014-09-24 DIAGNOSIS — I255 Ischemic cardiomyopathy: Secondary | ICD-10-CM | POA: Diagnosis not present

## 2014-09-24 LAB — CUP PACEART INCLINIC DEVICE CHECK
Battery Remaining Longevity: 107 mo
Battery Voltage: 3.08 V
Brady Statistic AP VP Percent: 0.01 %
Brady Statistic AS VS Percent: 0.26 %
Brady Statistic RA Percent Paced: 0.01 %
HIGH POWER IMPEDANCE MEASURED VALUE: 0 Ohm
HIGH POWER IMPEDANCE MEASURED VALUE: 62 Ohm
Lead Channel Impedance Value: 456 Ohm
Lead Channel Impedance Value: 456 Ohm
Lead Channel Impedance Value: 513 Ohm
Lead Channel Impedance Value: 513 Ohm
Lead Channel Impedance Value: 836 Ohm
Lead Channel Pacing Threshold Amplitude: 0.75 V
Lead Channel Pacing Threshold Pulse Width: 0.4 ms
Lead Channel Pacing Threshold Pulse Width: 0.4 ms
Lead Channel Sensing Intrinsic Amplitude: 1.125 mV
Lead Channel Setting Pacing Amplitude: 2 V
Lead Channel Setting Pacing Amplitude: 2.5 V
Lead Channel Setting Pacing Pulse Width: 0.4 ms
MDC IDC MSMT LEADCHNL RV PACING THRESHOLD AMPLITUDE: 0.75 V
MDC IDC MSMT LEADCHNL RV SENSING INTR AMPL: 10.875 mV
MDC IDC SESS DTM: 20160616100416
MDC IDC SET LEADCHNL RV SENSING SENSITIVITY: 0.45 mV
MDC IDC SET ZONE DETECTION INTERVAL: 400 ms
MDC IDC STAT BRADY AP VS PERCENT: 0 %
MDC IDC STAT BRADY AS VP PERCENT: 99.73 %
MDC IDC STAT BRADY RV PERCENT PACED: 99.11 %
Zone Setting Detection Interval: 250 ms
Zone Setting Detection Interval: 300 ms
Zone Setting Detection Interval: 350 ms

## 2014-09-24 NOTE — Progress Notes (Signed)
.      Patient Care Team: Geoffry Paradise, MD as PCP - General (Internal Medicine)   HPI  [name] is a 59 y.o. male Seen in followup for an CRT-D implanted for primary prevention April 2013 for ischemic cardiomyopathy and prior myocardial infarction and right bundle branch block.  He underwent device generator reimplantation 2/16  He is modestly improved following CRT. He is less winded. He still develops fatigue.  He has daytime somnolence and obstructive sleep apnea. He's had a sleep study but is not inclined to CPAP therapy.  There is been no significant change in functional status following the off programming of his LV lead   He's had no chest pain edema. There've been no intercurrent ICD discharges No changes in respiratory status no edema PND orthopnea  K 5.0 3/16 and CR also normal   Past Medical History  Diagnosis Date  . Ischemic cardiomyopathy     EF 36%; prior anterior MI, s/p CABG x 3; s/p cath Feb 2013 showing grafts to be patent with severe LV dysfunction  . Chronic systolic heart failure     class 2-3  . Hyperlipidemia   . Hemochromatosis   . RBBB (right bundle branch block)   . Rotator cuff injury   . PAF (paroxysmal atrial fibrillation)   . Anemia   . AMI anterior wall 2006  . CHF (congestive heart failure)   . Angina     "upon exertion"  . Pneumonia ~ 2001    "chemical; from smelling chlorox"  . Exertional dyspnea   . Type II diabetes mellitus   . ICD (implantable cardiac defibrillator) in place   . Pacemaker   . ICD-Medtronic-CRT 08/24/2011  . Sleep apnea     no cpap  . Coronary artery disease     Past Surgical History  Procedure Laterality Date  . Cardiac catheterization  03/12/2005    INFERIOR WALL AKINESIA. THERE IS GLOBAL HYPOKINESIA WITH OVERALL MODERATE TO SEVERE LEFT VENTRICULAR SYSTOLIC DYSFUNCTION. EF 35%  . Coronary artery bypass graft  2006    Emergent CABG x 3 with LIMA to LAD, SVG to OM, SVG to distal RCA per Dr.  Tyrone Sage  . Rotator cuff repair  ?2000/~ 2007    left; right   . Cardiac catheterization  Feb 2013    Grafts patent. EF is 20 to 25%.  . Cardiac defibrillator placement  08/21/11    "pacer/defib"  . Insert / replace / remove pacemaker    . Joint replacement      bil shoulders  . Inguinal hernia repair  02/09/2012    WITH MESH  . Inguinal hernia repair  02/09/2012    Procedure: HERNIA REPAIR INGUINAL ADULT;  Surgeon: Cherylynn Ridges, MD;  Location: [facility] OR;  Service: General;  Laterality: Right;  . Insertion of mesh  02/09/2012    Procedure: INSERTION OF MESH;  Surgeon: Cherylynn Ridges, MD;  Location: [facility] OR;  Service: General;  Laterality: Right;  . Left heart catheterization with coronary/graft angiogram N/A 05/25/2011    Procedure: LEFT HEART CATHETERIZATION WITH Isabel Caprice;  Surgeon: Peter M Swaziland, MD;  Location: [facility] CATH LAB;  Service: Cardiovascular;  Laterality: N/A;  . Bi-ventricular implantable cardioverter defibrillator N/A 08/21/2011    Procedure: BI-VENTRICULAR IMPLANTABLE CARDIOVERTER DEFIBRILLATOR  (CRT-D);  Surgeon: Duke Salvia, MD;  Location: [facility] CATH LAB;  Service: Cardiovascular;  Laterality: N/A;  . Biv icd genertaor change out N/A 06/12/2014    Procedure: BIV ICD GENERTAOR CHANGE  OUT;  Surgeon: Duke Salvia, MD;  Location: [facility] CATH LAB;  Service: Cardiovascular;  Laterality: N/A;    Current Outpatient Prescriptions  Medication Sig Dispense Refill  . aspirin 81 MG tablet Take 81 mg by mouth daily.    . carvedilol (COREG) 25 MG tablet Take 1/2 tablet by mouth every morning and 1 tablet by mouth every evening. 45 tablet 6  . fenofibrate 160 MG tablet TAKE 1 TABLET BY MOUTH DAILY. 30 tablet 6  . furosemide (LASIX) 20 MG tablet TAKE 1/2 TABLET BY MOUTH DAILY. 30 tablet 3  . lisinopril (PRINIVIL,ZESTRIL) 20 MG tablet TAKE 1/2 TABLET BY MOUTH DAILY. 30 tablet 3  . metFORMIN (GLUCOPHAGE) 500 MG tablet Take 1,000 mg by mouth 2 (two) times daily with a meal.     .  NITROSTAT 0.4 MG SL tablet PLACE 1 TABLET (0.4 MG TOTAL) UNDER THE TONGUE EVERY 5 (FIVE) MINUTES AS NEEDED FOR CHEST PAIN. 25 tablet 6  . Omega-3 Fatty Acids (RA FISH OIL) 1400 MG CPDR Take 1,400 mg by mouth 2 (two) times daily.    . rosuvastatin (CRESTOR) 5 MG tablet Take 5 mg 3 times a week  Mon-Wed-Fri 35 tablet 0  . spironolactone (ALDACTONE) 25 MG tablet Take 1/2 tablet by mouth every morning. 30 tablet 6   No current facility-administered medications for this visit.    Allergies  Allergen Reactions  . Statins Other (See Comments)    Myalgias   . Zetia [Ezetimibe]     myalgias    Review of Systems negative except from HPI and PMH  Physical Exam BP 98/54 mmHg  Pulse 80  Ht 6' (1.829 m)  Wt 216 lb 3.2 oz (98.068 kg)  BMI 29.32 kg/m2  SpO2 97% Well developed and well nourished in no acute distress HENT normal E scleral and icterus clear Neck Supple JVP flat; carotids brisk and full Clear to ausculation  Device pocket well healed; without hematoma or erythema.  There is no tethering Regular rate and rhythm, no murmurs gallops or rub Soft with active bowel sounds No clubbing cyanosis none Edema Alert and oriented, grossly normal motor and sensory function Skin Warm and Dry  ECG demonstrates P. synchronous pacing with a narrow QRS 100 ms  Assessment and  Plan  Ischemic cardiomyopathy  CRT-Medtronic  The patient's device was interrogated and the information was fully reviewed.  The device was reprogrammed to  Maximize longevity  RBBB  Congestive heart failure-chronic systolic  High output LV capture threshold  OFF  Euvolemic continue current meds  Without symptoms of ischemia \ Surveillance laboratories are within range  CRT maintained with ventricular pacing because of underlying right bundle branch block and resynchronization in that electrical milliunit

## 2014-09-24 NOTE — Patient Instructions (Signed)
Medication Instructions:  Your physician recommends that you continue on your current medications as directed. Please refer to the Current Medication list given to you today.  Labwork: None ordered  Testing/Procedures: None ordered  Follow-Up: Remote monitoring is used to monitor your Pacemaker of ICD from home. This monitoring reduces the number of office visits required to check your device to one time per year. It allows [place] to keep an eye on the functioning of your device to ensure it is working properly. You are scheduled for a device check from home on 12/24/14. You may send your transmission at any time that day. If you have a wireless device, the transmission will be sent automatically. After your physician reviews your transmission, you will receive a postcard with your next transmission date.  Your physician wants you to follow-up in: 9 months with Dr. Graciela Husbands. You will receive a reminder letter in the mail two months in advance. If you don't receive a letter, please call our office to schedule the follow-up appointment.  Thank you for choosing [facility]!!

## 2014-09-28 ENCOUNTER — Other Ambulatory Visit: Payer: Self-pay | Admitting: Cardiology

## 2014-09-28 NOTE — Telephone Encounter (Signed)
Rx(s) sent to pharmacy electronically.  

## 2014-12-24 ENCOUNTER — Ambulatory Visit (INDEPENDENT_AMBULATORY_CARE_PROVIDER_SITE_OTHER): Payer: Medicare Other | Admitting: *Deleted

## 2014-12-24 ENCOUNTER — Encounter: Payer: Self-pay | Admitting: Internal Medicine

## 2014-12-24 DIAGNOSIS — I255 Ischemic cardiomyopathy: Secondary | ICD-10-CM

## 2014-12-24 DIAGNOSIS — I5022 Chronic systolic (congestive) heart failure: Secondary | ICD-10-CM | POA: Diagnosis not present

## 2014-12-24 NOTE — Progress Notes (Signed)
Remote ICD transmission.   

## 2015-01-01 LAB — CUP PACEART REMOTE DEVICE CHECK
Battery Remaining Longevity: 105 mo
Battery Voltage: 3.04 V
Brady Statistic AS VS Percent: 0.6 %
Brady Statistic RV Percent Paced: 97.99 %
Date Time Interrogation Session: 20160915073523
HIGH POWER IMPEDANCE MEASURED VALUE: 61 Ohm
Lead Channel Impedance Value: 494 Ohm
Lead Channel Impedance Value: 513 Ohm
Lead Channel Impedance Value: 532 Ohm
Lead Channel Pacing Threshold Amplitude: 0.625 V
Lead Channel Pacing Threshold Amplitude: 0.75 V
Lead Channel Pacing Threshold Pulse Width: 0.4 ms
Lead Channel Sensing Intrinsic Amplitude: 1.75 mV
Lead Channel Sensing Intrinsic Amplitude: 14.5 mV
Lead Channel Sensing Intrinsic Amplitude: 14.5 mV
Lead Channel Setting Pacing Amplitude: 2 V
Lead Channel Setting Pacing Amplitude: 2.5 V
Lead Channel Setting Sensing Sensitivity: 0.45 mV
MDC IDC MSMT LEADCHNL LV IMPEDANCE VALUE: 456 Ohm
MDC IDC MSMT LEADCHNL LV IMPEDANCE VALUE: 893 Ohm
MDC IDC MSMT LEADCHNL RA IMPEDANCE VALUE: 494 Ohm
MDC IDC MSMT LEADCHNL RA SENSING INTR AMPL: 1.75 mV
MDC IDC MSMT LEADCHNL RV PACING THRESHOLD PULSEWIDTH: 0.4 ms
MDC IDC SET LEADCHNL RV PACING PULSEWIDTH: 0.4 ms
MDC IDC SET ZONE DETECTION INTERVAL: 250 ms
MDC IDC STAT BRADY AP VP PERCENT: 0.02 %
MDC IDC STAT BRADY AP VS PERCENT: 0 %
MDC IDC STAT BRADY AS VP PERCENT: 99.37 %
MDC IDC STAT BRADY RA PERCENT PACED: 0.03 %
Zone Setting Detection Interval: 300 ms
Zone Setting Detection Interval: 350 ms
Zone Setting Detection Interval: 400 ms

## 2015-01-08 ENCOUNTER — Other Ambulatory Visit: Payer: Self-pay | Admitting: Cardiology

## 2015-01-19 ENCOUNTER — Encounter: Payer: Self-pay | Admitting: Cardiology

## 2015-03-03 ENCOUNTER — Telehealth: Payer: Self-pay | Admitting: Cardiology

## 2015-03-03 NOTE — Telephone Encounter (Signed)
Received [employer] Attending Physician Statement [facility] for Dr SwazilandJordan to complete and sign.  Received by [employer] mail.  Sent to [employer] @ [facility] bldg for letter/pkt be sent to patient for AUTH/PMT.  Sent via Courier on 03/03/15. lp

## 2015-03-25 ENCOUNTER — Telehealth: Payer: Self-pay | Admitting: Internal Medicine

## 2015-03-25 ENCOUNTER — Ambulatory Visit (INDEPENDENT_AMBULATORY_CARE_PROVIDER_SITE_OTHER): Payer: Medicare Other | Admitting: *Deleted

## 2015-03-25 ENCOUNTER — Other Ambulatory Visit: Payer: Self-pay | Admitting: Cardiology

## 2015-03-25 DIAGNOSIS — I5022 Chronic systolic (congestive) heart failure: Secondary | ICD-10-CM | POA: Diagnosis not present

## 2015-03-25 DIAGNOSIS — I255 Ischemic cardiomyopathy: Secondary | ICD-10-CM

## 2015-03-25 NOTE — Telephone Encounter (Signed)
Rx(s) sent to pharmacy electronically.  

## 2015-03-25 NOTE — Telephone Encounter (Signed)
Left VM for patient to return my call [employer] papers ready for pick up.

## 2015-03-25 NOTE — Progress Notes (Signed)
Remote ICD transmission.   

## 2015-03-29 LAB — CUP PACEART REMOTE DEVICE CHECK
Battery Remaining Longevity: 105 mo
Battery Voltage: 3.02 V
Brady Statistic AP VP Percent: 0.02 %
HIGH POWER IMPEDANCE MEASURED VALUE: 63 Ohm
Implantable Lead Implant Date: 20130513
Implantable Lead Implant Date: 20130513
Implantable Lead Implant Date: 20130513
Implantable Lead Location: 753859
Implantable Lead Location: 753860
Implantable Lead Model: 181
Implantable Lead Model: 4396
Lead Channel Impedance Value: 513 Ohm
Lead Channel Impedance Value: 570 Ohm
Lead Channel Impedance Value: 893 Ohm
Lead Channel Pacing Threshold Pulse Width: 0.4 ms
Lead Channel Sensing Intrinsic Amplitude: 1.75 mV
Lead Channel Sensing Intrinsic Amplitude: 25 mV
Lead Channel Setting Pacing Amplitude: 2 V
Lead Channel Setting Pacing Amplitude: 2.5 V
Lead Channel Setting Pacing Pulse Width: 0.4 ms
Lead Channel Setting Sensing Sensitivity: 0.45 mV
MDC IDC LEAD LOCATION: 753858
MDC IDC LEAD SERIAL: 316784
MDC IDC MSMT LEADCHNL LV IMPEDANCE VALUE: 532 Ohm
MDC IDC MSMT LEADCHNL RA IMPEDANCE VALUE: 494 Ohm
MDC IDC MSMT LEADCHNL RA PACING THRESHOLD AMPLITUDE: 0.75 V
MDC IDC MSMT LEADCHNL RA PACING THRESHOLD PULSEWIDTH: 0.4 ms
MDC IDC MSMT LEADCHNL RA SENSING INTR AMPL: 1.75 mV
MDC IDC MSMT LEADCHNL RV IMPEDANCE VALUE: 570 Ohm
MDC IDC MSMT LEADCHNL RV PACING THRESHOLD AMPLITUDE: 0.625 V
MDC IDC MSMT LEADCHNL RV SENSING INTR AMPL: 25 mV
MDC IDC SESS DTM: 20161215081705
MDC IDC STAT BRADY AP VS PERCENT: 0 %
MDC IDC STAT BRADY AS VP PERCENT: 99.54 %
MDC IDC STAT BRADY AS VS PERCENT: 0.43 %
MDC IDC STAT BRADY RA PERCENT PACED: 0.03 %
MDC IDC STAT BRADY RV PERCENT PACED: 98.6 %

## 2015-04-01 ENCOUNTER — Encounter: Payer: Self-pay | Admitting: Cardiology

## 2015-04-28 ENCOUNTER — Other Ambulatory Visit: Payer: Self-pay | Admitting: Nurse Practitioner

## 2015-04-28 ENCOUNTER — Other Ambulatory Visit: Payer: Self-pay | Admitting: Cardiology

## 2015-04-28 NOTE — Telephone Encounter (Signed)
Rx request sent to pharmacy.  

## 2015-06-25 ENCOUNTER — Other Ambulatory Visit: Payer: Self-pay | Admitting: Cardiology

## 2015-07-06 ENCOUNTER — Ambulatory Visit (INDEPENDENT_AMBULATORY_CARE_PROVIDER_SITE_OTHER): Payer: Medicare Other | Admitting: Physician Assistant

## 2015-07-06 ENCOUNTER — Encounter: Payer: Self-pay | Admitting: Physician Assistant

## 2015-07-06 VITALS — BP 98/68 | HR 69 | Ht 72.0 in | Wt 215.0 lb

## 2015-07-06 DIAGNOSIS — I5022 Chronic systolic (congestive) heart failure: Secondary | ICD-10-CM | POA: Diagnosis not present

## 2015-07-06 DIAGNOSIS — I255 Ischemic cardiomyopathy: Secondary | ICD-10-CM | POA: Diagnosis not present

## 2015-07-06 DIAGNOSIS — I480 Paroxysmal atrial fibrillation: Secondary | ICD-10-CM | POA: Diagnosis not present

## 2015-07-06 DIAGNOSIS — I2510 Atherosclerotic heart disease of native coronary artery without angina pectoris: Secondary | ICD-10-CM

## 2015-07-06 DIAGNOSIS — I2583 Coronary atherosclerosis due to lipid rich plaque: Secondary | ICD-10-CM

## 2015-07-06 DIAGNOSIS — E785 Hyperlipidemia, unspecified: Secondary | ICD-10-CM

## 2015-07-06 MED ORDER — SPIRONOLACTONE 25 MG PO TABS: 12.5000 mg | ORAL_TABLET | Freq: Every day | ORAL | Status: DC

## 2015-07-06 MED ORDER — LISINOPRIL 10 MG PO TABS: 10.0000 mg | ORAL_TABLET | Freq: Every day | ORAL | Status: DC

## 2015-07-06 NOTE — Progress Notes (Signed)
Patient ID: [name], male   DOB: 07-Feb-1956, 60 y.o.   MRN: [id number]    Date:  07/06/2015   ID:  [name],  [name], MRN [id number]  PCP:  Minda Meo, MD  Primary Cardiologist:  [place]   Chief Complaint  Patient presents with  . Annual Exam    PATIENT REPORTS NO PROBLEMS SINCE LAST VISIT. HAS VISIT TO SEE PCP TOMORROW.     History of Present Illness: [name] is a 60 y.o. male with a history of CAD and CHF. He has an ischemic CM and has CRT-D in place for primary prevention since April of 2013. EF by Echo was 45-50% at that time. Cardiac MRI showed an EF of 31%. Other issues include HLD (statin intolerant and intolerant to zetia), hemochromatosis, RBBB, PAF, type 2 DM and OSA. He is s/p CABG in 2006 following an MI. Cardiac cath in February 2013 showed patrent grafts. After CRT therapy he did have a significant improvement in dyspnea.  Generator change out 2016 without complication. He is intolerant of niacin, statins (Simcor and Vytorin) and Zetia.   [name] presents for one-year evaluation. He reports doing well with no particular complaints however, when I questioned him further he says he feels weak, out of breath and dizzy if he works with his arms up above his head for any period of time. The symptoms have been ongoing since his heart attack. He denies any chest pain or pressure with exertion.  he checks his blood pressure periodically at home and the other day was 88/58.   The patient currently denies nausea, vomiting, fever, chest pain, orthopnea, PND, cough, congestion, abdominal pain, hematochezia, melena, lower extremity edema, claudication.  Wt Readings from Last 3 Encounters:  07/06/15 215 lb (97.523 kg)  09/24/14 216 lb 3.2 oz (98.068 kg)  06/18/14 215 lb 8 oz (97.75 kg)     Past Medical History  Diagnosis Date  . Ischemic cardiomyopathy     EF 36%; prior anterior MI, s/p CABG x 3; s/p cath Feb 2013 showing grafts to be patent  with severe LV dysfunction  . Chronic systolic heart failure (HCC)     class 2-3  . Hyperlipidemia   . Hemochromatosis   . RBBB (right bundle branch block)   . Rotator cuff injury   . PAF (paroxysmal atrial fibrillation) (HCC)   . Anemia   . AMI anterior wall (HCC) 2006  . CHF (congestive heart failure) (HCC)   . Angina     "upon exertion"  . Pneumonia ~ 2001    "chemical; from smelling chlorox"  . Exertional dyspnea   . Type II diabetes mellitus (HCC)   . ICD (implantable cardiac defibrillator) in place   . Pacemaker   . ICD-Medtronic-CRT 08/24/2011  . Sleep apnea     no cpap  . Coronary artery disease     Current Outpatient Prescriptions  Medication Sig Dispense Refill  . aspirin 81 MG tablet Take 81 mg by mouth daily.    . carvedilol (COREG) 25 MG tablet TAKE 1/2 TABLET BY MOUTH EVERY MORNING AND 1 TABLET BY MOUTH EVERY EVENING. 45 tablet 6  . fenofibrate 160 MG tablet TAKE 1 TABLET BY MOUTH DAILY. 30 tablet 6  . furosemide (LASIX) 20 MG tablet TAKE 1/2 TABLET BY MOUTH DAILY. 30 tablet 3  . metFORMIN (GLUCOPHAGE) 500 MG tablet Take 1,000 mg by mouth 2 (two) times daily with a meal.     . NITROSTAT 0.4 MG  SL tablet PLACE 1 TABLET UNDER THE TONGUE EVERY 5 MINUTES AS NEEDED FOR CHEST PAIN FOR UP TO 3 DOSES. 25 tablet 3  . Omega-3 Fatty Acids (RA FISH OIL) 1400 MG CPDR Take 1,400 mg by mouth 2 (two) times daily.    . rosuvastatin (CRESTOR) 5 MG tablet Take 5 mg 3 times a week  Mon-Wed-Fri 35 tablet 0  . spironolactone (ALDACTONE) 25 MG tablet Take 0.5 tablets (12.5 mg total) by mouth daily. 30 tablet 11  . lisinopril (PRINIVIL,ZESTRIL) 10 MG tablet Take 1 tablet (10 mg total) by mouth daily. 30 tablet 11   No current facility-administered medications for this visit.    Allergies:    Allergies  Allergen Reactions  . Statins Other (See Comments)    Myalgias   . Zetia [Ezetimibe]     myalgias    Social History:  The patient  reports that he quit smoking about 10 years  ago. His smoking use included Cigarettes. He has a 60 pack-year smoking history. He has never used smokeless tobacco. He reports that he drinks about 1.8 oz of alcohol per week. He reports that he does not use illicit drugs.   Family history:   Family History  Problem Relation Age of Onset  . Adopted: Yes    ROS:  Please see the history of present illness.  All other systems reviewed and negative.   PHYSICAL EXAM: VS:  BP 98/68 mmHg  Pulse 69  Ht 6' (1.829 m)  Wt 215 lb (97.523 kg)  BMI 29.15 kg/m2 Well nourished, well developed, in no acute distress HEENT: Pupils are equal round react to light accommodation extraocular movements are intact.  Neck: no JVDNo cervical lymphadenopathy. Cardiac: Regular rate and rhythm without murmurs rubs or gallops. Lungs:  clear to auscultation bilaterally, no wheezing, rhonchi or rales Abd: soft, nontender, positive bowel sounds all quadrants, no hepatosplenomegaly Ext: no lower extremity edema.  2+ radial and dorsalis pedis pulses. Skin: warm and dry Neuro:  Grossly normal    EKG:  A sensed V paced  ASSESSMENT AND PLAN:  Problem List Items Addressed This Visit    PAF (paroxysmal atrial fibrillation) (HCC) - Primary   Relevant Medications   lisinopril (PRINIVIL,ZESTRIL) 10 MG tablet   spironolactone (ALDACTONE) 25 MG tablet   Other Relevant Orders   EKG 12-Lead   Ischemic cardiomyopathy   Relevant Medications   lisinopril (PRINIVIL,ZESTRIL) 10 MG tablet   spironolactone (ALDACTONE) 25 MG tablet   Hyperlipidemia   Relevant Medications   lisinopril (PRINIVIL,ZESTRIL) 10 MG tablet   spironolactone (ALDACTONE) 25 MG tablet   Chronic systolic heart failure (HCC)   Relevant Medications   lisinopril (PRINIVIL,ZESTRIL) 10 MG tablet   spironolactone (ALDACTONE) 25 MG tablet   CAD (coronary artery disease)   Relevant Medications   lisinopril (PRINIVIL,ZESTRIL) 10 MG tablet   spironolactone (ALDACTONE) 25 MG tablet   Other Relevant Orders     EKG 12-Lead     [name] appears to be doing well. It doesn't sound as though he exerts himself too much. 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 He's not been having any angina. He's A sensed V paced on his EKG. He is hypotensive and said his blood pressure at home other day was about 88/58. I'll decrease his lisinopril to 5 mg from 10 and see if he feels a little bit better. He appears euvolemic on exam.  He continues  to take Crestor and omega-3 fatty acids.  FU in 6 months with Dr. [place].

## 2015-07-06 NOTE — Patient Instructions (Signed)
Your physician has recommended you make the following change in your medication:   Your Lisinopril has been decreased to 5 mg daily. You have been given a 10 mg tablet to cut into 1/2. Take 1/2 tablet daily.  Your provider wants you to follow-up in: 6 months or sooner if needed with Dr SwazilandJordan. You will receive a reminder letter in the mail two months in advance. If you don't receive a letter, please call our office to schedule the follow-up appointment.

## 2015-07-15 LAB — IFOBT (OCCULT BLOOD): IMMUNOLOGICAL FECAL OCCULT BLOOD TEST: NEGATIVE

## 2015-10-22 ENCOUNTER — Other Ambulatory Visit: Payer: Self-pay | Admitting: Internal Medicine

## 2015-10-26 ENCOUNTER — Telehealth: Payer: Self-pay | Admitting: *Deleted

## 2015-10-26 NOTE — Telephone Encounter (Signed)
Mrs. Lalla BrothersLambert calling about her husband's ICD. She reports that they were out at the pool on vacation today and he swam across the pool, came out of the water and heard the alert tone from his ICD. He was asymptomatic. About 10 minutes later, he was not active at the time, his ICD fired. Again, he was asymptomatic. They do not have his monitor with them on vacation. Due to the alert tone then the shock, I have advised them to seek treatment at the local ER for his ICD to be checked. She is ok to drive him since he continues to be asymptomatic. She verbalizes understanding.

## 2015-11-17 NOTE — Progress Notes (Signed)
Electrophysiology Office Note Date: 11/18/2015  ID:  Colton, Weaver 04/28/1955, MRN [id number]  PCP: Minda Meo, MD Primary Cardiologist: [place] Electrophysiologist: Graciela Husbands  CC: Follow-up for ICD shock  [name] is a 60 y.o. male seen today for Dr Graciela Husbands.  He presents today for follow up after a recent beach trip and an ICD shock.  He was at the beach in a pool that was part of the housing complex.  He was standing in about 5 feet of water when he heard his device alert. He stayed in the water for several more minutes and then was walking towards the stairs to climb out of the pool when he received an ICD shock. He was evaluated in the ER and device interrogation showed inappropriate device therapy for noise on A and V lead.  The pool was found to not be well grounded and repaired afterwards.  He denies chest pain, palpitations, dyspnea, PND, orthopnea, nausea, vomiting, dizziness, syncope, edema, weight gain, or early satiety.     Device History: MDT CRTD implanted 2013 for ICM, CHF, gen change 2016 History of appropriate therapy: No History of AAD therapy: no   Past Medical History:  Diagnosis Date  . AMI anterior wall (HCC) 2006  . Anemia   . Angina    "upon exertion"  . CHF (congestive heart failure) (HCC)   . Chronic systolic heart failure (HCC)    class 2-3  . Coronary artery disease   . Exertional dyspnea   . Hemochromatosis   . Hyperlipidemia   . ICD (implantable cardiac defibrillator) in place   . ICD-Medtronic-CRT 08/24/2011  . Ischemic cardiomyopathy    EF 36%; prior anterior MI, s/p CABG x 3; s/p cath Feb 2013 showing grafts to be patent with severe LV dysfunction  . Pacemaker   . PAF (paroxysmal atrial fibrillation) (HCC)   . 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 RBBB (right bundle branch block)   . Rotator cuff injury   . Sleep apnea    no cpap  . Type II diabetes mellitus (HCC)    Past Surgical History:  Procedure  Laterality Date  . BI-VENTRICULAR IMPLANTABLE CARDIOVERTER DEFIBRILLATOR N/A 08/21/2011   Procedure: BI-VENTRICULAR IMPLANTABLE CARDIOVERTER DEFIBRILLATOR  (CRT-D);  Surgeon: Duke Salvia, MD;  Location: [facility] CATH LAB;  Service: Cardiovascular;  Laterality: N/A;  . BIV ICD GENERTAOR CHANGE OUT N/A 06/12/2014   Procedure: BIV ICD GENERTAOR CHANGE OUT;  Surgeon: Duke Salvia, MD;  Location: [facility] CATH LAB;  Service: Cardiovascular;  Laterality: N/A;  . CARDIAC CATHETERIZATION  03/12/2005   INFERIOR WALL AKINESIA. THERE IS GLOBAL HYPOKINESIA WITH OVERALL MODERATE TO SEVERE LEFT VENTRICULAR SYSTOLIC DYSFUNCTION. EF 35%  . CARDIAC CATHETERIZATION  Feb 2013   Grafts patent. EF is 20 to 25%.  Marland Kitchen CARDIAC DEFIBRILLATOR PLACEMENT  08/21/11   "pacer/defib"  . CORONARY ARTERY BYPASS GRAFT  2006   Emergent CABG x 3 with LIMA to LAD, SVG to OM, SVG to distal RCA per Dr. Tyrone Sage  . INGUINAL HERNIA REPAIR  02/09/2012   WITH MESH  . INGUINAL HERNIA REPAIR  02/09/2012   Procedure: HERNIA REPAIR INGUINAL ADULT;  Surgeon: Cherylynn Ridges, MD;  Location: [facility] OR;  Service: General;  Laterality: Right;  . INSERT / REPLACE / REMOVE PACEMAKER    . INSERTION OF MESH  02/09/2012   Procedure: INSERTION OF MESH;  Surgeon: Cherylynn Ridges, MD;  Location: [facility] OR;  Service: General;  Laterality:  Right;  Marland Kitchen. JOINT REPLACEMENT     bil shoulders  . LEFT HEART CATHETERIZATION WITH CORONARY/GRAFT ANGIOGRAM N/A 05/25/2011   Procedure: LEFT HEART CATHETERIZATION WITH Isabel CapriceORONARY/GRAFT ANGIOGRAM;  Surgeon: Peter M SwazilandJordan, MD;  [facility] CATH LAB;  Service: Cardiovascular;  Laterality: N/A;  . ROTATOR CUFF REPAIR  ?2000/~ 2007   left; right     Current Outpatient Prescriptions  Medication Sig Dispense Refill  . aspirin 81 MG tablet Take 81 mg by mouth daily.    . carvedilol (COREG) 25 MG tablet TAKE 1/2 TABLET BY MOUTH EVERY MORNING AND 1 TABLET BY MOUTH EVERY EVENING. 135 tablet 2  . fenofibrate 160 MG tablet TAKE 1 TABLET BY MOUTH DAILY.  90 tablet 2  . furosemide (LASIX) 20 MG tablet TAKE 1/2 TABLET BY MOUTH DAILY. 30 tablet 3  . lisinopril (PRINIVIL,ZESTRIL) 10 MG tablet Take 1 tablet (10 mg total) by mouth daily. 30 tablet 11  . metFORMIN (GLUCOPHAGE) 500 MG tablet Take 1,000 mg by mouth 2 (two) times daily with a meal.     . NITROSTAT 0.4 MG SL tablet PLACE 1 TABLET UNDER THE TONGUE EVERY 5 MINUTES AS NEEDED FOR CHEST PAIN FOR UP TO 3 DOSES. 25 tablet 3  . Omega-3 Fatty Acids (RA FISH OIL) 1400 MG CPDR Take 1,400 mg by mouth 2 (two) times daily.    Letta Pate. ONETOUCH VERIO test strip 1 each by Other route as needed for other.   6  . spironolactone (ALDACTONE) 25 MG tablet Take 0.5 tablets (12.5 mg total) by mouth daily. 30 tablet 11   No current facility-administered medications for this visit.     Allergies:   Statins and Zetia [ezetimibe]   Social History: Social History   Social History  . Marital status: Married    Spouse name: N/A  . Number of children: 1  . Years of education: N/A   Occupational History  . telephone service    Social History Main Topics  . Smoking status: Former Smoker    Packs/day: 2.00    Years: 30.00    Types: Cigarettes    Quit date: 03/11/2005  . Smokeless tobacco: Never Used  . Alcohol use 1.8 oz/week    3 Cans of beer per week     Comment: weekly  . Drug use: No  . Sexual activity: Not Currently   Other Topics Concern  . Not on file   Social History Narrative  . No narrative on file    Family History: Family History  Problem Relation Age of Onset  . Adopted: Yes    Review of Systems: All other systems reviewed and are otherwise negative except as noted above.   Physical Exam: VS:  BP 110/60   Pulse 78   Ht 6' (1.829 m)   Wt 209 lb 6.4 oz (95 kg)   BMI 28.40 kg/m  , BMI Body mass index is 28.4 kg/m.  GEN- The patient is well appearing, alert and oriented x 3 today.   HEENT: normocephalic, atraumatic; sclera clear, conjunctiva pink; hearing intact; oropharynx  clear; neck supple, no JVP Lymph- no cervical lymphadenopathy Lungs- Clear to ausculation bilaterally, normal work of breathing.  No wheezes, rales, rhonchi Heart- Regular rate and rhythm, no murmurs, rubs or gallops, PMI not laterally displaced GI- soft, non-tender, non-distended, bowel sounds present, no hepatosplenomegaly Extremities- no clubbing, cyanosis, or edema; DP/PT/radial pulses 2+ bilaterally MS- no significant deformity or atrophy Skin- warm and dry, no rash or lesion; ICD pocket well healed  Psych- euthymic mood, full affect Neuro- strength and sensation are intact  ICD interrogation- reviewed in detail today,  See PACEART report  EKG:  EKG is ordered today. The ekg ordered today shows sinus rhythm with ventricular pacing   Recent Labs: No results found for requested labs within last 8760 hours.   Wt Readings from Last 3 Encounters:  11/18/15 209 lb 6.4 oz (95 kg)  07/06/15 215 lb (97.5 kg)  09/24/14 216 lb 3.2 oz (98.1 kg)     Other studies Reviewed: Additional studies/ records that were reviewed today include: Dr [place] and Dr Odessa Fleming office notes  Assessment and Plan:  1.  Chronic systolic dysfunction euvolemic today Stable on an appropriate medical regimen Normal ICD function LV lead programmed off 2/2 high output and RBBB See Pace Art report No changes today Pt has intrinsic conduction but RV lead is programmed to pace.  I have left programmed as is today as it looks like per Dr Odessa Fleming last note this was intentional with RBBB   2.  ICM/CAD No recent ischemic symptoms Continue medical therapy  3. Inappropriate ICD therapy 2/2 poorly grounded swimming pool Pool has since been repaired    Current medicines are reviewed at length with the patient today.   The patient does not have concerns regarding his medicines.  The following changes were made today:  none  Labs/ tests ordered today include:  Orders Placed This Encounter  Procedures  . EKG  12-Lead     Disposition:   Follow up with [facility], Dr Graciela Husbands 1 year    Signed, Gypsy Balsam, NP 11/18/2015 12:16 PM  [facility] [address] [place] [address] [phone] (office) [phone] (fax)

## 2015-11-18 ENCOUNTER — Encounter: Payer: Self-pay | Admitting: Nurse Practitioner

## 2015-11-18 ENCOUNTER — Encounter: Payer: Self-pay | Admitting: Internal Medicine

## 2015-11-18 ENCOUNTER — Encounter (INDEPENDENT_AMBULATORY_CARE_PROVIDER_SITE_OTHER): Payer: Self-pay

## 2015-11-18 ENCOUNTER — Ambulatory Visit (INDEPENDENT_AMBULATORY_CARE_PROVIDER_SITE_OTHER): Payer: Medicare Other | Admitting: Nurse Practitioner

## 2015-11-18 VITALS — BP 110/60 | HR 78 | Ht 72.0 in | Wt 209.4 lb

## 2015-11-18 DIAGNOSIS — I480 Paroxysmal atrial fibrillation: Secondary | ICD-10-CM

## 2015-11-18 DIAGNOSIS — I255 Ischemic cardiomyopathy: Secondary | ICD-10-CM

## 2015-11-18 NOTE — Patient Instructions (Signed)
Medication Instructions: Your physician recommends that you continue on your current medications as directed. Please refer to the Current Medication list given to you today.   Labwork: NONE ORDERED  Procedures/Testing: NONE ORDERED  Follow-Up: Remote monitoring is used to monitor your ICD from home. This monitoring reduces the number of office visits required to check your device to one time per year. It allows us to keep an eye on the functioning of your device to ensure it is working properly. You are scheduled for a device check from home on 02/18/16. You may send your transmission at any time that day. If you have a wireless device, the transmission will be sent automatically. After your physician reviews your transmission, you will receive a postcard with your next transmission date.  Your physician wants you to follow-up in 1 year with Dr. Graciela HusbandsKlein. You will receive a reminder letter in the mail two months in advance. If you don't receive a letter, please call our office to schedule the follow-up appointment.   Any Additional Special Instructions Will Be Listed Below (If Applicable).     If you need a refill on your cardiac medications before your next appointment, please call your pharmacy.

## 2015-11-19 ENCOUNTER — Other Ambulatory Visit: Payer: Self-pay | Admitting: Cardiology

## 2015-11-19 LAB — CUP PACEART INCLINIC DEVICE CHECK
Date Time Interrogation Session: 20170811080326
Implantable Lead Location: 753858
Implantable Lead Location: 753859
Implantable Lead Location: 753860
Implantable Lead Model: 4396
MDC IDC LEAD IMPLANT DT: 20130513
MDC IDC LEAD IMPLANT DT: 20130513
MDC IDC LEAD IMPLANT DT: 20130513
MDC IDC LEAD MODEL: 181
MDC IDC LEAD SERIAL: 316784

## 2015-11-19 NOTE — Telephone Encounter (Signed)
REFILL 

## 2015-12-31 ENCOUNTER — Encounter: Payer: Self-pay | Admitting: Physician Assistant

## 2016-01-03 ENCOUNTER — Ambulatory Visit (INDEPENDENT_AMBULATORY_CARE_PROVIDER_SITE_OTHER): Payer: Medicare Other | Admitting: Physician Assistant

## 2016-01-03 ENCOUNTER — Telehealth: Payer: Self-pay

## 2016-01-03 ENCOUNTER — Encounter: Payer: Self-pay | Admitting: Physician Assistant

## 2016-01-03 VITALS — BP 106/56 | HR 73 | Ht 72.0 in | Wt 205.6 lb

## 2016-01-03 DIAGNOSIS — E785 Hyperlipidemia, unspecified: Secondary | ICD-10-CM | POA: Diagnosis not present

## 2016-01-03 DIAGNOSIS — I25810 Atherosclerosis of coronary artery bypass graft(s) without angina pectoris: Secondary | ICD-10-CM | POA: Diagnosis not present

## 2016-01-03 DIAGNOSIS — I5022 Chronic systolic (congestive) heart failure: Secondary | ICD-10-CM

## 2016-01-03 DIAGNOSIS — Z95810 Presence of automatic (implantable) cardiac defibrillator: Secondary | ICD-10-CM

## 2016-01-03 DIAGNOSIS — I255 Ischemic cardiomyopathy: Secondary | ICD-10-CM

## 2016-01-03 DIAGNOSIS — E118 Type 2 diabetes mellitus with unspecified complications: Secondary | ICD-10-CM

## 2016-01-03 MED ORDER — LISINOPRIL 10 MG PO TABS: 5.0000 mg | ORAL_TABLET | Freq: Every day | ORAL | 11 refills | Status: DC

## 2016-01-03 NOTE — Patient Instructions (Signed)
Medication Instructions:  Your physician recommends that you continue on your current medications as directed. Please refer to the Current Medication list given to you today.   Labwork: I will get your lab work from your Primary Care Physician.   Testing/Procedures: none  Follow-Up: Your physician wants you to follow-up in: 12 months with Dr. [place]Jordan. You will receive a reminder letter in the mail two months in advance. If you don't receive a letter, please call our office to schedule the follow-up appointment.   Any Other Special Instructions Will Be Listed Below (If Applicable).     If you need a refill on your cardiac medications before your next appointment, please call your pharmacy.

## 2016-01-03 NOTE — Telephone Encounter (Signed)
Left message to call back Pt needs Lipid Clinic Consultation appt at his convenience for elevated LDL levels.

## 2016-01-03 NOTE — Progress Notes (Signed)
Cardiology Office Note    Date:  01/03/2016   ID:  Colton MilroyLeslie A Weaver, DOB 1955-06-05, MRN [id number]  PCP:  [name],RICHARD A, MD  Cardiologist:  Dr. SwazilandJordan Electrophysiologist: Dr. Graciela HusbandsKlein  Chief Complaint  Patient presents with  . Follow-up    seen for Dr. SwazilandJordan, 6 month followup.    History of Present Illness:  [name] is a 60 y.o. male with PMH of CAD s/p CABG x 3, type II DM, HLD, chronic systolic HF and ischemic cardiomyoapthy with EF 35-40% s/p Medtronic CRT-D. He also has hemochromatosis, right bundle branch block, PAF and OSA. He has intolerance to niacin and Zetia and statins including Simcor and Vytorin. He is on fenobrate and omega-3 fatty acids. Last cardiac catheterization in February 2013 showed patent stents. He underwent ICD implantation in 07/2011 for primary prevention due to ischemic cardiomyopathy. After CRT therapy, he did have significant improvement in dyspnea. He eventually underwent generator change out in 2016 without complication. Last echocardiogram obtained on 07/01/2014 showed EF 35-40%, grade 1 diastolic dysfunction, diffuse hypokinesis with regional variation especially apex and periapical segment.   His last follow-up was on 07/06/2015 at which time he was feeling well. He does report some out of breath and dizziness when he works with his arms above his head which has been persistent since this originally heart attack. Sometimes his blood pressure does periodically drops into the 80s. His lisinopril was decreased to 5 mg from 10 mg. He was last seen by electrophysiology service on 11/18/2015, it appears he has had inappropriate shock while in the poor. He was evaluated in the emergency room and the device interrogation showed inappropriate device therapy for the noise on A and V lead. The pool was found to not be well grounded and repaired afterward. No further change to the device was made on his recent visit.   He presents today for six-month follow-up  with general cardiology service. He says he has been doing very well, denying any exertional chest discomfort or shortness of breath. He denies any PND, orthopnea or swelling. Fortunately, his device has not went off again. He did fix his pool's grounding issue. Also fortunately, when he was recently inappropriately shocked by his device in the pool, his wife was present. Otherwise, he says he has no problem mowing his 2 acre yard. I did not see any recent labs since 2015. He says his PCP obtain most of his labs and he sees his PCP every 4 month. I will request his most recent CBC, BMET, fasting lipid and hemoglobin A1c. His last fasting lipid panel in 2015 showed severely uncontrolled total cholesterol, LDL, HDL and triglyceride. He says he is under the impression that his recent total cholesterol is borderline high. If his lipid panel still shows uncontrolled LDL and total cholesterol, I will likely refer him to our lipid clinic. He says he is no longer taking Crestor either for several years. He has tried Lipitor, Zocor, Crestor, Zetia, Vytorin, and niacin. He has problems tolerating statins. His blood pressure has also been stable, blood pressure today 106/56.   Past Medical History:  Diagnosis Date  . AMI anterior wall (HCC) 2006  . Anemia   . Angina    "upon exertion"  . CHF (congestive heart failure) (HCC)   . Chronic systolic heart failure (HCC)    class 2-3  . Coronary artery disease   . Exertional dyspnea   . Hemochromatosis   . Hyperlipidemia   . ICD (implantable  cardiac defibrillator) in place   . ICD-Medtronic-CRT 08/24/2011  . Ischemic cardiomyopathy    EF 36%; prior anterior MI, s/p CABG x 3; s/p cath Feb 2013 showing grafts to be patent with severe LV dysfunction  . Pacemaker   . PAF (paroxysmal atrial fibrillation) (HCC)   . 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 RBBB (right bundle branch block)   . Rotator cuff injury   . Sleep apnea    no cpap  . Type II  diabetes mellitus (HCC)     Past Surgical History:  Procedure Laterality Date  . BI-VENTRICULAR IMPLANTABLE CARDIOVERTER DEFIBRILLATOR N/A 08/21/2011   Procedure: BI-VENTRICULAR IMPLANTABLE CARDIOVERTER DEFIBRILLATOR  (CRT-D);  Surgeon: Duke Salvia, MD;  Location: [facility] CATH LAB;  Service: Cardiovascular;  Laterality: N/A;  . BIV ICD GENERTAOR CHANGE OUT N/A 06/12/2014   Procedure: BIV ICD GENERTAOR CHANGE OUT;  Surgeon: Duke Salvia, MD;  Location: [facility] CATH LAB;  Service: Cardiovascular;  Laterality: N/A;  . CARDIAC CATHETERIZATION  03/12/2005   INFERIOR WALL AKINESIA. THERE IS GLOBAL HYPOKINESIA WITH OVERALL MODERATE TO SEVERE LEFT VENTRICULAR SYSTOLIC DYSFUNCTION. EF 35%  . CARDIAC CATHETERIZATION  Feb 2013   Grafts patent. EF is 20 to 25%.  Marland Kitchen CARDIAC DEFIBRILLATOR PLACEMENT  08/21/11   "pacer/defib"  . CORONARY ARTERY BYPASS GRAFT  2006   Emergent CABG x 3 with LIMA to LAD, SVG to OM, SVG to distal RCA per Dr. Tyrone Sage  . INGUINAL HERNIA REPAIR  02/09/2012   WITH MESH  . INGUINAL HERNIA REPAIR  02/09/2012   Procedure: HERNIA REPAIR INGUINAL ADULT;  Surgeon: Cherylynn Ridges, MD;  Location: [facility] OR;  Service: General;  Laterality: Right;  . INSERT / REPLACE / REMOVE PACEMAKER    . INSERTION OF MESH  02/09/2012   Procedure: INSERTION OF MESH;  Surgeon: Cherylynn Ridges, MD;  Location: [facility] OR;  Service: General;  Laterality: Right;  . JOINT REPLACEMENT     bil shoulders  . LEFT HEART CATHETERIZATION WITH CORONARY/GRAFT ANGIOGRAM N/A 05/25/2011   Procedure: LEFT HEART CATHETERIZATION WITH Isabel Caprice;  Surgeon: Peter M Swaziland, MD;  Location: [facility] CATH LAB;  Service: Cardiovascular;  Laterality: N/A;  . ROTATOR CUFF REPAIR  ?2000/~ 2007   left; right     Current Medications: Outpatient Medications Prior to Visit  Medication Sig Dispense Refill  . aspirin 81 MG tablet Take 81 mg by mouth daily.    . carvedilol (COREG) 25 MG tablet TAKE 1/2 TABLET BY MOUTH EVERY MORNING AND 1 TABLET BY  MOUTH EVERY EVENING. 135 tablet 2  . fenofibrate 160 MG tablet TAKE 1 TABLET BY MOUTH DAILY. 90 tablet 2  . furosemide (LASIX) 20 MG tablet TAKE 1/2 TABLET BY MOUTH DAILY. 30 tablet 11  . metFORMIN (GLUCOPHAGE) 500 MG tablet Take 1,000 mg by mouth 2 (two) times daily with a meal.     . NITROSTAT 0.4 MG SL tablet PLACE 1 TABLET UNDER THE TONGUE EVERY 5 MINUTES AS NEEDED FOR CHEST PAIN FOR UP TO 3 DOSES. 25 tablet 3  . Omega-3 Fatty Acids (RA FISH OIL) 1400 MG CPDR Take 1,400 mg by mouth 2 (two) times daily.    Letta Pate VERIO test strip 1 each by Other route as needed for other.   6  . spironolactone (ALDACTONE) 25 MG tablet Take 0.5 tablets (12.5 mg total) by mouth daily. 30 tablet 11  . lisinopril (PRINIVIL,ZESTRIL) 10 MG tablet Take 1 tablet (10 mg total) by mouth  daily. 30 tablet 11   No facility-administered medications prior to visit.      Allergies:   Statins and Zetia [ezetimibe]   Social History   Social History  . Marital status: Married    Spouse name: N/A  . Number of children: 1  . Years of education: N/A   Occupational History  . telephone service    Social History Main Topics  . Smoking status: Former Smoker    Packs/day: 2.00    Years: 30.00    Types: Cigarettes    Quit date: 03/11/2005  . Smokeless tobacco: Never Used  . Alcohol use 1.8 oz/week    3 Cans of beer per week     Comment: weekly  . Drug use: No  . Sexual activity: Not Currently   Other Topics Concern  . None   Social History Narrative  . None     Family History:  The patient's family history is not on file. He was adopted.   ROS:   Please see the history of present illness.    ROS All other systems reviewed and are negative.   PHYSICAL EXAM:   VS:  BP (!) 106/56 (BP Location: Left Arm, Patient Position: Sitting, Cuff Size: Normal)   Pulse 73   Ht 6' (1.829 m)   Wt 205 lb 9.6 oz (93.3 kg)   SpO2 94%   BMI 27.88 kg/m    GEN: Well nourished, well developed, in no acute distress    HEENT: normal  Neck: no JVD, carotid bruits, or masses Cardiac: RRR; no murmurs, rubs, or gallops,no edema  Respiratory:  clear to auscultation bilaterally, normal work of breathing GI: soft, nontender, nondistended, + BS MS: no deformity or atrophy  Skin: warm and dry, no rash Neuro:  Alert and Oriented x 3, Strength and sensation are intact Psych: euthymic mood, full affect  Wt Readings from Last 3 Encounters:  01/03/16 205 lb 9.6 oz (93.3 kg)  11/18/15 209 lb 6.4 oz (95 kg)  07/06/15 215 lb (97.5 kg)      Studies/Labs Reviewed:   EKG:  EKG is not ordered today.   Recent Labs: No results found for requested labs within last 8760 hours.   Lipid Panel    Component Value Date/Time   CHOL 269 (H) 04/14/2013 0958   TRIG 217.0 (H) 04/14/2013 0958   HDL 35.20 (L) 04/14/2013 0958   CHOLHDL 8 04/14/2013 0958   VLDL 43.4 (H) 04/14/2013 0958   LDLCALC 116 (H) 01/13/2011 0851   LDLDIRECT 206.0 04/14/2013 0958    Additional studies/ records that were reviewed today include:   Echo 07/01/2014 LV EF: 35% -  40%  ------------------------------------------------------------------- Indications:   CAD status post CABG (I25.810).  ------------------------------------------------------------------- History:  PMH:  Atrial fibrillation. Coronary artery disease. Congestive heart failure. Cardiomyopathy of unknown etiology. PMH:  Myocardial infarction. Risk factors: Obstructive Sleep Apnea, Right Bundle Branch Block, Pneumonia, Ischemic Cardiomyopathy. Former tobacco use. Hypertension. Diabetes mellitus. Dyslipidemia.  ------------------------------------------------------------------- Study Conclusions  - Left ventricle: The cavity size was normal. Wall thickness was normal. Systolic function was moderately reduced. The estimated ejection fraction was in the range of 35% to 40%. Diffuse hypokinesis, the apex and periapical segments appeared most abnormal.  Doppler parameters are consistent with abnormal left ventricular relaxation (grade 1 diastolic dysfunction). - Aortic valve: There was no stenosis. - Mitral valve: There was trivial regurgitation. - Left atrium: The atrium was mildly dilated. - Right ventricle: Poorly visualized. The cavity size was normal. Pacer wire  or catheter noted in right ventricle. Systolic function was mildly reduced. - Pulmonary arteries: No complete TR doppler jet so unable to estimate PA systolic pressure. - Inferior vena cava: The vessel was normal in size. The respirophasic diameter changes were in the normal range (= 50%), consistent with normal central venous pressure.  Impressions:  - Technically difficult study with poor acoustic windows. Echo contrast may have helped but was not used (would use in future). Normal LV size with EF 35-40%. Diffuse hypokinesis, worse in the apex and peri-apical segments. Poorly visualized RV but probably normal size and mildly decreased systolic function.   ASSESSMENT:    1. Coronary artery disease involving coronary bypass graft of native heart without angina pectoris   2. Cardiomyopathy, ischemic   3. Chronic systolic heart failure (HCC)   4. Hyperlipidemia   5. Controlled type 2 diabetes mellitus with complication, without long-term current use of insulin (HCC)   6. Cardiac resynchronization therapy defibrillator (CRT-D) in place      PLAN:  In order of problems listed above:  1. CAD s/p CABG: No problem doing exertional activities recently. No shortness of breath, lower extremity edema, orthopnea or PND. He is stable from cardiology perspective.  2. Chronic systolic HF with baseline EF 35-40%: On spironolactone and 10 mg Lasix daily, otherwise doing very well, no sign of fluid overload on physical exam.  3. HLD: Last lipid profile in 2015, she is he says he has been obtaining his labs at his PCPs office, we will request the records, he is  currently on fenofibrate and the fish oil, if his lipid profile is still uncontrolled, we will refer him to the lipid clinic.  4. ICM s/p CRT-D: Recently had inappropriate shock due to improper grounding in the pool. He has not had any further issues since. Recommend continue his follow-up with EP service every 6 month.  5.   DM II: Hemoglobin A1c reportedly checked by primary care provider as well, we will obtain the records.   Medication Adjustments/Labs and Tests Ordered: Current medicines are reviewed at length with the patient today.  Concerns regarding medicines are outlined above.  Medication changes, Labs and Tests ordered today are listed in the Patient Instructions below. Patient Instructions  Medication Instructions:  Your physician recommends that you continue on your current medications as directed. Please refer to the Current Medication list given to you today.   Labwork: I will get your lab work from your Primary Care Physician.   Testing/Procedures: none  Follow-Up: Your physician wants you to follow-up in: 12 months with Dr. Swaziland. You will receive a reminder letter in the mail two months in advance. If you don't receive a letter, please call our office to schedule the follow-up appointment.   Any Other Special Instructions Will Be Listed Below (If Applicable).     If you need a refill on your cardiac medications before your next appointment, please call your pharmacy.      Ramond Dial, [place]  01/03/2016 9:22 AM    [facility] [address] [place], [place], [place]  [address] Phone: [phone]; Fax: [phone]

## 2016-01-19 ENCOUNTER — Emergency Department (HOSPITAL_COMMUNITY): Payer: Medicare Other

## 2016-01-19 ENCOUNTER — Encounter (HOSPITAL_COMMUNITY)
Admission: EM | Disposition: A | Discharge status: Home / Self Care | Payer: Self-pay | Source: Home / Self Care | Attending: Cardiology

## 2016-01-19 ENCOUNTER — Encounter (HOSPITAL_COMMUNITY): Payer: Self-pay

## 2016-01-19 ENCOUNTER — Inpatient Hospital Stay (HOSPITAL_COMMUNITY)
Admission: EM | Admit: 2016-01-19 | Discharge: 2016-01-21 | DRG: 287 | Disposition: A | Discharge status: Home / Self Care | Payer: Medicare Other | Attending: Cardiology | Admitting: Cardiology

## 2016-01-19 DIAGNOSIS — Z96612 Presence of left artificial shoulder joint: Secondary | ICD-10-CM | POA: Diagnosis present

## 2016-01-19 DIAGNOSIS — Z87891 Personal history of nicotine dependence: Secondary | ICD-10-CM

## 2016-01-19 DIAGNOSIS — Z7982 Long term (current) use of aspirin: Secondary | ICD-10-CM | POA: Diagnosis not present

## 2016-01-19 DIAGNOSIS — I4510 Unspecified right bundle-branch block: Secondary | ICD-10-CM | POA: Diagnosis present

## 2016-01-19 DIAGNOSIS — I255 Ischemic cardiomyopathy: Secondary | ICD-10-CM

## 2016-01-19 DIAGNOSIS — Z95810 Presence of automatic (implantable) cardiac defibrillator: Secondary | ICD-10-CM | POA: Diagnosis not present

## 2016-01-19 DIAGNOSIS — E876 Hypokalemia: Secondary | ICD-10-CM

## 2016-01-19 DIAGNOSIS — E782 Mixed hyperlipidemia: Secondary | ICD-10-CM | POA: Diagnosis not present

## 2016-01-19 DIAGNOSIS — I252 Old myocardial infarction: Secondary | ICD-10-CM | POA: Diagnosis not present

## 2016-01-19 DIAGNOSIS — I480 Paroxysmal atrial fibrillation: Secondary | ICD-10-CM | POA: Diagnosis present

## 2016-01-19 DIAGNOSIS — I472 Ventricular tachycardia, unspecified: Secondary | ICD-10-CM

## 2016-01-19 DIAGNOSIS — E119 Type 2 diabetes mellitus without complications: Secondary | ICD-10-CM | POA: Diagnosis present

## 2016-01-19 DIAGNOSIS — Z4502 Encounter for adjustment and management of automatic implantable cardiac defibrillator: Secondary | ICD-10-CM

## 2016-01-19 DIAGNOSIS — Z7984 Long term (current) use of oral hypoglycemic drugs: Secondary | ICD-10-CM

## 2016-01-19 DIAGNOSIS — I2510 Atherosclerotic heart disease of native coronary artery without angina pectoris: Secondary | ICD-10-CM | POA: Diagnosis present

## 2016-01-19 DIAGNOSIS — I4901 Ventricular fibrillation: Secondary | ICD-10-CM

## 2016-01-19 DIAGNOSIS — I5022 Chronic systolic (congestive) heart failure: Secondary | ICD-10-CM | POA: Diagnosis present

## 2016-01-19 DIAGNOSIS — G4733 Obstructive sleep apnea (adult) (pediatric): Secondary | ICD-10-CM | POA: Diagnosis present

## 2016-01-19 DIAGNOSIS — E785 Hyperlipidemia, unspecified: Secondary | ICD-10-CM | POA: Diagnosis present

## 2016-01-19 DIAGNOSIS — Z96611 Presence of right artificial shoulder joint: Secondary | ICD-10-CM | POA: Diagnosis present

## 2016-01-19 DIAGNOSIS — Z79899 Other long term (current) drug therapy: Secondary | ICD-10-CM | POA: Diagnosis not present

## 2016-01-19 DIAGNOSIS — Z951 Presence of aortocoronary bypass graft: Secondary | ICD-10-CM

## 2016-01-19 DIAGNOSIS — I2582 Chronic total occlusion of coronary artery: Secondary | ICD-10-CM | POA: Diagnosis present

## 2016-01-19 HISTORY — DX: Ventricular tachycardia: I47.2

## 2016-01-19 HISTORY — PX: CARDIAC CATHETERIZATION: SHX172

## 2016-01-19 HISTORY — DX: Ventricular tachycardia, unspecified: I47.20

## 2016-01-19 LAB — CBC
HCT: 37.2 % — ABNORMAL LOW (ref 39.0–52.0)
HEMATOCRIT: 37 % — AB (ref 39.0–52.0)
HEMATOCRIT: 37.6 % — AB (ref 39.0–52.0)
Hemoglobin: 13.2 g/dL (ref 13.0–17.0)
Hemoglobin: 13 g/dL (ref 13.0–17.0)
Hemoglobin: 13.2 g/dL (ref 13.0–17.0)
MCH: 33.1 pg (ref 26.0–34.0)
MCH: 32.7 pg (ref 26.0–34.0)
MCH: 33 pg (ref 26.0–34.0)
MCHC: 35.7 g/dL (ref 30.0–36.0)
MCHC: 34.9 g/dL (ref 30.0–36.0)
MCHC: 35.1 g/dL (ref 30.0–36.0)
MCV: 92.7 fL (ref 78.0–100.0)
MCV: 93.5 fL (ref 78.0–100.0)
MCV: 94 fL (ref 78.0–100.0)
PLATELETS: 197 10*3/uL (ref 150–400)
PLATELETS: 215 10*3/uL (ref 150–400)
Platelets: 218 10*3/uL (ref 150–400)
RBC: 3.99 MIL/uL — AB (ref 4.22–5.81)
RBC: 3.98 MIL/uL — ABNORMAL LOW (ref 4.22–5.81)
RBC: 4 MIL/uL — ABNORMAL LOW (ref 4.22–5.81)
RDW: 11.9 % (ref 11.5–15.5)
RDW: 11.9 % (ref 11.5–15.5)
RDW: 12 % (ref 11.5–15.5)
WBC: 7.6 10*3/uL (ref 4.0–10.5)
WBC: 6.8 10*3/uL (ref 4.0–10.5)
WBC: 5.5 10*3/uL (ref 4.0–10.5)

## 2016-01-19 LAB — BASIC METABOLIC PANEL
ANION GAP: 11 (ref 5–15)
BUN: 17 mg/dL (ref 6–20)
CALCIUM: 9 mg/dL (ref 8.9–10.3)
CO2: 20 mmol/L — AB (ref 22–32)
CREATININE: 1.27 mg/dL — AB (ref 0.61–1.24)
Chloride: 103 mmol/L (ref 101–111)
GFR calc Af Amer: >60 mL/min (ref >60)
GFR calc non Af Amer: >60 mL/min (ref >60)
GLUCOSE: 149 mg/dL — AB (ref 65–99)
Potassium: 4.3 mmol/L (ref 3.5–5.1)
Sodium: 134 mmol/L — ABNORMAL LOW (ref 135–145)

## 2016-01-19 LAB — COMPREHENSIVE METABOLIC PANEL
ALT: 16 U/L — AB (ref 17–63)
AST: 17 U/L (ref 15–41)
Albumin: 3.7 g/dL (ref 3.5–5.0)
Alkaline Phosphatase: 28 U/L — ABNORMAL LOW (ref 38–126)
Anion gap: 13 (ref 5–15)
BILIRUBIN TOTAL: 1 mg/dL (ref 0.3–1.2)
BUN: 18 mg/dL (ref 6–20)
CALCIUM: 9 mg/dL (ref 8.9–10.3)
CO2: 19 mmol/L — ABNORMAL LOW (ref 22–32)
CREATININE: 1.4 mg/dL — AB (ref 0.61–1.24)
Chloride: 102 mmol/L (ref 101–111)
GFR, EST NON AFRICAN AMERICAN: 54 mL/min — AB (ref >60)
Glucose, Bld: 189 mg/dL — ABNORMAL HIGH (ref 65–99)
Potassium: 3.1 mmol/L — ABNORMAL LOW (ref 3.5–5.1)
Sodium: 134 mmol/L — ABNORMAL LOW (ref 135–145)
TOTAL PROTEIN: 6 g/dL — AB (ref 6.5–8.1)

## 2016-01-19 LAB — CREATININE, SERUM
Creatinine, Ser: 1.39 mg/dL — ABNORMAL HIGH (ref 0.61–1.24)
GFR calc non Af Amer: 54 mL/min — ABNORMAL LOW (ref >60)

## 2016-01-19 LAB — GLUCOSE, CAPILLARY
Glucose-Capillary: 158 mg/dL — ABNORMAL HIGH (ref 65–99)
Glucose-Capillary: 116 mg/dL — ABNORMAL HIGH (ref 65–99)
Glucose-Capillary: 124 mg/dL — ABNORMAL HIGH (ref 65–99)

## 2016-01-19 LAB — MAGNESIUM
MAGNESIUM: 2.4 mg/dL (ref 1.7–2.4)
Magnesium: 1.5 mg/dL — ABNORMAL LOW (ref 1.7–2.4)

## 2016-01-19 LAB — MRSA PCR SCREENING: MRSA by PCR: NEGATIVE

## 2016-01-19 LAB — I-STAT TROPONIN, ED: Troponin i, poc: 0 ng/mL (ref 0.00–0.08)

## 2016-01-19 LAB — PROTIME-INR
INR: 1.05
PROTHROMBIN TIME: 13.7 s (ref 11.4–15.2)

## 2016-01-19 SURGERY — LEFT HEART CATH AND CORS/GRAFTS ANGIOGRAPHY

## 2016-01-19 MED ORDER — FENTANYL CITRATE (PF) 100 MCG/2ML IJ SOLN
INTRAMUSCULAR | Status: DC | PRN
  Administered 2016-01-19: 25 ug via INTRAVENOUS

## 2016-01-19 MED ORDER — ALPRAZOLAM 0.25 MG PO TABS
0.2500 mg | ORAL_TABLET | Freq: Every evening | ORAL | Status: DC | PRN
  Administered 2016-01-19: 0.25 mg via ORAL
  Filled 2016-01-19: qty 1

## 2016-01-19 MED ORDER — NITROGLYCERIN 0.4 MG SL SUBL: 0.4000 mg | SUBLINGUAL_TABLET | SUBLINGUAL | Status: DC | PRN

## 2016-01-19 MED ORDER — SODIUM CHLORIDE 0.9% FLUSH
3.0000 mL | Freq: Two times a day (BID) | INTRAVENOUS | Status: DC
Start: 2016-01-19 — End: 2016-01-19

## 2016-01-19 MED ORDER — MIDAZOLAM HCL 2 MG/2ML IJ SOLN
INTRAMUSCULAR | Status: AC
  Filled 2016-01-19: qty 2

## 2016-01-19 MED ORDER — INSULIN ASPART 100 UNIT/ML ~~LOC~~ SOLN
0.0000 [IU] | Freq: Three times a day (TID) | SUBCUTANEOUS | Status: DC
  Administered 2016-01-20: 2 [IU] via SUBCUTANEOUS
  Administered 2016-01-20: 1 [IU] via SUBCUTANEOUS
  Administered 2016-01-21: 2 [IU] via SUBCUTANEOUS
  Administered 2016-01-21: 1 [IU] via SUBCUTANEOUS
  Administered 2016-01-21: 2 [IU] via SUBCUTANEOUS

## 2016-01-19 MED ORDER — IOPAMIDOL (ISOVUE-370) INJECTION 76%
INTRAVENOUS | Status: DC | PRN
  Administered 2016-01-19: 65 mL

## 2016-01-19 MED ORDER — ACETAMINOPHEN 325 MG PO TABS: 650.0000 mg | ORAL_TABLET | ORAL | Status: DC | PRN

## 2016-01-19 MED ORDER — SODIUM CHLORIDE 0.9 % IV SOLN: INTRAVENOUS | Status: DC

## 2016-01-19 MED ORDER — OMEGA-3-ACID ETHYL ESTERS 1 G PO CAPS
1000.0000 mg | ORAL_CAPSULE | Freq: Every day | ORAL | Status: DC
  Administered 2016-01-19 – 2016-01-21 (×3): 1000 mg via ORAL
  Filled 2016-01-19 (×3): qty 1

## 2016-01-19 MED ORDER — SODIUM CHLORIDE 0.9% FLUSH
3.0000 mL | Freq: Two times a day (BID) | INTRAVENOUS | Status: DC
  Administered 2016-01-19 – 2016-01-21 (×4): 3 mL via INTRAVENOUS

## 2016-01-19 MED ORDER — SOTALOL HCL 80 MG PO TABS
80.0000 mg | ORAL_TABLET | Freq: Two times a day (BID) | ORAL | Status: DC
Start: 2016-01-19 — End: 2016-01-19

## 2016-01-19 MED ORDER — SODIUM CHLORIDE 0.9% FLUSH: 3.0000 mL | INTRAVENOUS | Status: DC | PRN

## 2016-01-19 MED ORDER — LIDOCAINE HCL (PF) 1 % IJ SOLN
INTRAMUSCULAR | Status: DC | PRN
  Administered 2016-01-19: 15 mL

## 2016-01-19 MED ORDER — SODIUM CHLORIDE 0.9 % IV SOLN: 250.0000 mL | INTRAVENOUS | Status: DC | PRN

## 2016-01-19 MED ORDER — ASPIRIN 81 MG PO CHEW
81.0000 mg | CHEWABLE_TABLET | Freq: Every day | ORAL | Status: DC
Start: 2016-01-19 — End: 2016-01-21
  Administered 2016-01-19 – 2016-01-21 (×3): 81 mg via ORAL
  Filled 2016-01-19 (×3): qty 1

## 2016-01-19 MED ORDER — MAGNESIUM SULFATE 2 GM/50ML IV SOLN
2.0000 g | Freq: Once | INTRAVENOUS | Status: AC
  Administered 2016-01-19: 2 g via INTRAVENOUS
  Filled 2016-01-19: qty 50

## 2016-01-19 MED ORDER — LIDOCAINE HCL (PF) 1 % IJ SOLN
INTRAMUSCULAR | Status: AC
  Filled 2016-01-19: qty 30

## 2016-01-19 MED ORDER — SODIUM CHLORIDE 0.9 % WEIGHT BASED INFUSION: 1.0000 mL/kg/h | INTRAVENOUS | Status: AC

## 2016-01-19 MED ORDER — LISINOPRIL 5 MG PO TABS
5.0000 mg | ORAL_TABLET | Freq: Every day | ORAL | Status: DC
  Administered 2016-01-19 – 2016-01-21 (×3): 5 mg via ORAL
  Filled 2016-01-19 (×3): qty 1

## 2016-01-19 MED ORDER — IOPAMIDOL (ISOVUE-370) INJECTION 76%
INTRAVENOUS | Status: AC
  Filled 2016-01-19: qty 125

## 2016-01-19 MED ORDER — ENOXAPARIN SODIUM 40 MG/0.4ML ~~LOC~~ SOLN
40.0000 mg | SUBCUTANEOUS | Status: DC
  Filled 2016-01-19: qty 0.4

## 2016-01-19 MED ORDER — CARVEDILOL 25 MG PO TABS
25.0000 mg | ORAL_TABLET | Freq: Two times a day (BID) | ORAL | Status: DC
  Administered 2016-01-19 – 2016-01-21 (×5): 25 mg via ORAL
  Filled 2016-01-19 (×5): qty 1

## 2016-01-19 MED ORDER — SOTALOL HCL 120 MG PO TABS
120.0000 mg | ORAL_TABLET | Freq: Two times a day (BID) | ORAL | Status: DC
  Administered 2016-01-19 – 2016-01-21 (×4): 120 mg via ORAL
  Filled 2016-01-19 (×5): qty 1

## 2016-01-19 MED ORDER — FUROSEMIDE 20 MG PO TABS
10.0000 mg | ORAL_TABLET | Freq: Every day | ORAL | Status: DC
  Administered 2016-01-19 – 2016-01-21 (×3): 10 mg via ORAL
  Filled 2016-01-19 (×2): qty 1
  Filled 2016-01-19: qty 0.5

## 2016-01-19 MED ORDER — HEPARIN (PORCINE) IN NACL 2-0.9 UNIT/ML-% IJ SOLN
INTRAMUSCULAR | Status: AC
  Filled 2016-01-19: qty 1000

## 2016-01-19 MED ORDER — FENTANYL CITRATE (PF) 100 MCG/2ML IJ SOLN
INTRAMUSCULAR | Status: AC
Start: 2016-01-19 — End: 2016-01-19
  Filled 2016-01-19: qty 2

## 2016-01-19 MED ORDER — FENOFIBRATE 160 MG PO TABS
160.0000 mg | ORAL_TABLET | Freq: Every day | ORAL | Status: DC
  Administered 2016-01-19: 160 mg via ORAL
  Filled 2016-01-19: qty 1

## 2016-01-19 MED ORDER — ENOXAPARIN SODIUM 40 MG/0.4ML ~~LOC~~ SOLN
40.0000 mg | SUBCUTANEOUS | Status: DC
Start: 2016-01-19 — End: 2016-01-19

## 2016-01-19 MED ORDER — SODIUM CHLORIDE 0.9 % IV SOLN
INTRAVENOUS | Status: DC | PRN
  Administered 2016-01-19: 10 mL/h via INTRAVENOUS

## 2016-01-19 MED ORDER — MIDAZOLAM HCL 2 MG/2ML IJ SOLN
INTRAMUSCULAR | Status: DC | PRN
  Administered 2016-01-19: 1 mg via INTRAVENOUS

## 2016-01-19 MED ORDER — METFORMIN HCL 500 MG PO TABS
1000.0000 mg | ORAL_TABLET | Freq: Two times a day (BID) | ORAL | Status: DC
  Administered 2016-01-19: 1000 mg via ORAL
  Filled 2016-01-19: qty 2

## 2016-01-19 MED ORDER — SPIRONOLACTONE 25 MG PO TABS
12.5000 mg | ORAL_TABLET | Freq: Every day | ORAL | Status: DC
  Administered 2016-01-19 – 2016-01-20 (×2): 12.5 mg via ORAL
  Filled 2016-01-19 (×2): qty 1

## 2016-01-19 MED ORDER — HEPARIN (PORCINE) IN NACL 2-0.9 UNIT/ML-% IJ SOLN
INTRAMUSCULAR | Status: DC | PRN
  Administered 2016-01-19: 16:00:00

## 2016-01-19 MED ORDER — POTASSIUM CHLORIDE CRYS ER 10 MEQ PO TBCR
40.0000 meq | EXTENDED_RELEASE_TABLET | Freq: Once | ORAL | Status: AC
  Administered 2016-01-19: 40 meq via ORAL
  Filled 2016-01-19: qty 4

## 2016-01-19 MED ORDER — ONDANSETRON HCL 4 MG/2ML IJ SOLN: 4.0000 mg | Freq: Four times a day (QID) | INTRAMUSCULAR | Status: DC | PRN

## 2016-01-19 SURGICAL SUPPLY — 8 items
CATH INFINITI 5FR MULTPACK ANG (CATHETERS) ×2 IMPLANT
ELECT DEFIB PAD ADLT CADENCE (PAD) ×2 IMPLANT
KIT HEART LEFT (KITS) ×3 IMPLANT
PACK CARDIAC CATHETERIZATION (CUSTOM PROCEDURE TRAY) ×3 IMPLANT
SHEATH PINNACLE 5F 10CM (SHEATH) ×2 IMPLANT
SYR MEDRAD MARK V 150ML (SYRINGE) ×3 IMPLANT
TRANSDUCER W/STOPCOCK (MISCELLANEOUS) ×3 IMPLANT
WIRE EMERALD 3MM-J .035X150CM (WIRE) ×2 IMPLANT

## 2016-01-19 NOTE — ED Notes (Signed)
Updated on plan of care.  

## 2016-01-19 NOTE — Consult Note (Signed)
 ELECTROPHYSIOLOGY CONSULT NOTE    Patient ID: [name] MRN: [id number], DOB/AGE: 05/08/1955 59 y.o.  Admit date: 01/19/2016 Date of Consult: 01/19/2016   Primary Physician: ARONSON,RICHARD A, MD Primary Cardiologist: Dr. Jordan Electrophysiologist: Dr. Klein  Reason for Consultation: VT storm, multiple ICD therapies  HPI: [name] is a 59 y.o. male with PMHx of CAD, ICM/chronic (systolic) CHF w/CRT-D, PAFib, DM, RBBB, HLD statin intolerant as per Dr. Jordan's notes.  He comes to [facility] admitted after being shocked 3x.   The patient has been feeling very well, he denies any kind of CP, exertional intolerances or illness of late.  He reports that he was sitting in his recliner, about MN and got up to head to bed.  He started feeling "swimmy" in the head and got to his bed and laid down.  A few minutes later felt the shock.  He got up to go to his wife to let her know and once at the doorway felt a shock again, was disoriented finding he had falln to the floor, thinks he may have blacked out a second though felt the second shock, EMS was called and he received a third shock en-route.  He denies any kind of CP outside of the shocks, no overt sensations of palpitations, but was lightheaded.    He was noted to have VT (260-270ms cycle length) on his ICD interrogation, total of 9 therapies, 6 with successful ATP's, 3 required defibrillation. Optivol looks well compensated Battery/lead status' stable No other episodes outside of 01/19/16 except for the July date that was deemed inappropriate with external noise    LABS: K+ 3.1 > 4.3 Mag 1.5 replaced w/2g BUN/creat 18/1.40 > 17/1.27 H/H 13/37 WBC 7.6 plts 197 Trop 0.00    DEVICE HISTORY: MDT CRT-D, LV lead programmed off 2/2 high output and RBBB  implanted 08/21/11, gen change 06/12/14, Dr. Klein, primary prevention, CHF No hx of ADD therapy or previous appropriate therapies *10/2015 received inappropriate tx for noise  (external) while in a poorly grounded pool) *At his last visit 11/18/15 w/Amber Seiler, ARNP she noted: pt has intrinsic conduction but RV lead is programmed to pace.  was left programmed as is as it looked like per Dr Klein's note this was intentional with RBBB   Past Medical History:  Diagnosis Date  . AMI anterior wall (HCC) 2006  . Anemia   . Angina    "upon exertion"  . CHF (congestive heart failure) (HCC)   . Chronic systolic heart failure (HCC)    class 2-3  . Coronary artery disease   . Exertional dyspnea   . Hemochromatosis   . Hyperlipidemia   . ICD (implantable cardiac defibrillator) in place   . ICD-Medtronic-CRT 08/24/2011  . Ischemic cardiomyopathy    EF 36%; prior anterior MI, s/p CABG x 3; s/p cath Feb 2013 showing grafts to be patent with severe LV dysfunction  . Pacemaker   . PAF (paroxysmal atrial fibrillation) (HCC)   . 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 RBBB (right bundle branch block)   . Rotator cuff injury   . Sleep apnea    no cpap  . Type II diabetes mellitus (HCC)      Surgical History:  Past Surgical History:  Procedure Laterality Date  . BI-VENTRICULAR IMPLANTABLE CARDIOVERTER DEFIBRILLATOR N/A 08/21/2011   Procedure: BI-VENTRICULAR IMPLANTABLE CARDIOVERTER DEFIBRILLATOR  (CRT-D);  Surgeon: Steven C Klein, MD;  Location: [facility];  Service: Cardiovascular;  Laterality:   N/A;  . BIV ICD GENERTAOR CHANGE OUT N/A 06/12/2014   Procedure: BIV ICD GENERTAOR CHANGE OUT;  Surgeon: Steven C Klein, MD;  Location: [facility]ATH [facility];  Service: Cardiovascular;  Laterality: N/A;  . CARDIAC CATHETERIZATION  03/12/2005   INFERIOR WALL AKINESIA. THERE IS GLOBAL HYPOKINESIA WITH OVERALL MODERATE TO SEVERE LEFT VENTRICULAR SYSTOLIC DYSFUNCTION. EF 35%  . CARDIAC CATHETERIZATION  Feb 2013   Grafts patent. EF is 20 to 25%.  . CARDIAC DEFIBRILLATOR PLACEMENT  08/21/11   "pacer/defib"  . CORONARY ARTERY BYPASS GRAFT  2006   Emergent CABG x 3 with LIMA to  LAD, SVG to OM, SVG to distal RCA per Dr. Gerhardt  . INGUINAL HERNIA REPAIR  02/09/2012   WITH MESH  . INGUINAL HERNIA REPAIR  02/09/2012   Procedure: HERNIA REPAIR INGUINAL ADULT;  Surgeon: James O Wyatt, MD;  Location: [facility] OR;  Service: General;  Laterality: Right;  . INSERT / REPLACE / REMOVE PACEMAKER    . INSERTION OF MESH  02/09/2012   Procedure: INSERTION OF MESH;  Surgeon: James O Wyatt, MD;  Location: [facility] OR;  Service: General;  Laterality: Right;  . JOINT REPLACEMENT     bil shoulders  . LEFT HEART CATHETERIZATION WITH CORONARY/GRAFT ANGIOGRAM N/A 05/25/2011   Procedure: LEFT HEART CATHETERIZATION WITH CORONARY/GRAFT ANGIOGRAM;  Surgeon: Peter M Jordan, MD;  Location: [facility];  Service: Cardiovascular;  Laterality: N/A;  . ROTATOR CUFF REPAIR  ?2000/~ 2007   left; right      Prescriptions Prior to Admission  Medication Sig Dispense Refill Last Dose  . aspirin 81 MG tablet Take 81 mg by mouth daily.   01/18/2016 at Unknown time  . carvedilol (COREG) 25 MG tablet TAKE 1/2 TABLET BY MOUTH EVERY MORNING AND 1 TABLET BY MOUTH EVERY EVENING. 135 tablet 2 01/18/2016 at 1800  . fenofibrate 160 MG tablet TAKE 1 TABLET BY MOUTH DAILY. 90 tablet 2 01/18/2016 at Unknown time  . furosemide (LASIX) 20 MG tablet TAKE 1/2 TABLET BY MOUTH DAILY. 30 tablet 11 01/18/2016 at Unknown time  . lisinopril (PRINIVIL,ZESTRIL) 10 MG tablet Take 0.5 tablets (5 mg total) by mouth daily. 30 tablet 11 01/18/2016 at Unknown time  . metFORMIN (GLUCOPHAGE) 500 MG tablet Take 1,000 mg by mouth 2 (two) times daily with a meal.    01/18/2016 at Unknown time  . NITROSTAT 0.4 MG SL tablet PLACE 1 TABLET UNDER THE TONGUE EVERY 5 MINUTES AS NEEDED FOR CHEST PAIN FOR UP TO 3 DOSES. 25 tablet 3 unknown  . Omega-3 Fatty Acids (RA FISH OIL) 1400 MG CPDR Take 1,400 mg by mouth 2 (two) times daily.   01/18/2016 at Unknown time  . spironolactone (ALDACTONE) 25 MG tablet Take 0.5 tablets (12.5 mg total) by mouth daily. 30 tablet  11 01/18/2016 at Unknown time  . ONETOUCH VERIO test strip 1 each by Other route as needed for other.   6 Taking    Inpatient Medications:  . aspirin  81 mg Oral Daily  . carvedilol  25 mg Oral BID WC  . enoxaparin (LOVENOX) injection  40 mg Subcutaneous Q24H  . fenofibrate  160 mg Oral Daily  . furosemide  10 mg Oral Daily  . lisinopril  5 mg Oral Daily  . metFORMIN  1,000 mg Oral BID WC  . omega-3 acid ethyl esters  1,000 mg Oral Daily  . spironolactone  12.5 mg Oral Daily    Allergies:  Allergies  Allergen Reactions  . Statins Other (See   Comments)    Myalgias   . Zetia [Ezetimibe] Other (See Comments)    myalgias    Social History   Social History  . Marital status: Married    Spouse name: N/A  . Number of children: 1  . Years of education: N/A   Occupational History  . telephone service    Social History Main Topics  . Smoking status: Former Smoker    Packs/day: 2.00    Years: 30.00    Types: Cigarettes    Quit date: 03/11/2005  . Smokeless tobacco: Never Used  . Alcohol use 1.8 oz/week    3 Cans of beer per week     Comment: weekly  . Drug use: No  . Sexual activity: Not Currently   Other Topics Concern  . Not on file   Social History Narrative  . No narrative on file     Family History  Problem Relation Age of Onset  . Adopted: Yes     Review of Systems: All other systems reviewed and are otherwise negative except as noted above.  Physical Exam: Vitals:   01/19/16 0500 01/19/16 0530 01/19/16 0600 01/19/16 0645  BP: 118/61 105/61 116/66 114/69  Pulse: 78 72 73 71  Resp: 16 13 15 12  Temp:    98 F (36.7 C)  TempSrc:    Oral  SpO2: 95% 96% 95% 95%  Weight:    209 lb 14.1 oz (95.2 kg)  Height:    6' (1.829 m)    GEN- The patient is well appearing, alert and oriented x 3 today.   HEENT: normocephalic, atraumatic; sclera clear, conjunctiva pink; hearing intact; oropharynx clear; neck supple, no JVP Lymph- no cervical  lymphadenopathy Lungs- Clear to ausculation bilaterally, normal work of breathing.  No wheezes, rales, rhonchi Heart- Regular rate and rhythm, no murmurs, rubs or gallops, PMI not laterally displaced GI- soft, non-tender, non-distended Extremities- no clubbing, cyanosis, or edema MS- no significant deformity or atrophy Skin- warm and dry, no rash or lesion Psych- euthymic mood, full affect Neuro- no gross deficits observed  Labs:   Lab Results  Component Value Date   WBC 6.8 01/19/2016   HGB 13.0 01/19/2016   HCT 37.2 (L) 01/19/2016   MCV 93.5 01/19/2016   PLT 218 01/19/2016    Recent Labs Lab 01/19/16 0206 01/19/16 0703  NA 134* 134*  K 3.1* 4.3  CL 102 103  CO2 19* 20*  BUN 18 17  CREATININE 1.40* 1.27*  CALCIUM 9.0 9.0  PROT 6.0*  --   BILITOT 1.0  --   ALKPHOS 28*  --   ALT 16*  --   AST 17  --   GLUCOSE 189* 149*      Radiology/Studies:  Dg Chest Portable 1 View Result Date: 01/19/2016 CLINICAL DATA:  Acute onset of dizziness. Defibrillator fired 3 times today. Initial encounter. EXAM: PORTABLE CHEST 1 VIEW COMPARISON:  Chest radiograph performed 08/22/2011 FINDINGS: The lungs are well-aerated. Minimal left basilar atelectasis is noted. There is no evidence of pleural effusion or pneumothorax. The cardiomediastinal silhouette is borderline normal in size. The patient is status post median sternotomy, with evidence of prior CABG. A pacemaker/AICD is noted overlying the left chest wall, with leads ending overlying the right atrium, right ventricle and coronary sinus. No acute osseous abnormalities are seen. IMPRESSION: Minimal left basilar atelectasis noted.  Lungs otherwise clear. Electronically Signed   By: Jeffery  Chang M.D.   On: 01/19/2016 02:37    EKG: SR,   V paced TELEMETRY: SR, occ PVC, couplet  07/01/14: TTE Study Conclusions - Left ventricle: The cavity size was normal. Wall thickness was normal. Systolic function was moderately reduced. The  estimated ejection fraction was in the range of 35% to 40%. Diffuse hypokinesis, the apex and periapical segments appeared most abnormal. Doppler parameters are consistent with abnormal left ventricular relaxation (grade 1 diastolic dysfunction). - Aortic valve: There was no stenosis. - Mitral valve: There was trivial regurgitation. - Left atrium: The atrium was mildly dilated. - Right ventricle: Poorly visualized. The cavity size was normal. Pacer wire or catheter noted in right ventricle. Systolic function was mildly reduced. - Pulmonary arteries: No complete TR doppler jet so unable to estimate PA systolic pressure. - Inferior vena cava: The vessel was normal in size. The respirophasic diameter changes were in the normal range (= 50%), consistent with normal central venous pressure. Impressions: - Technically difficult study with poor acoustic windows. Echo contrast may have helped but was not used (would use in future). Normal LV size with EF 35-40%. Diffuse hypokinesis, worse in the apex and peri-apical segments. Poorly visualized RV but probably normal size and mildly decreased systolic function.\  05/25/11: [facility] Final Conclusions:   1. Severe 3 vessel obstructive atherosclerotic coronary disease. 2. Patent saphenous vein graft to the distal right coronary and patent saphenous vein graft to the second obtuse marginal vessel. These grafts are very small in caliber due to small target vessels. 3. Patent LIMA graft to LAD. 4. Severe left ventricular dysfunction.  Recommendations:  recommend aggressive medical therapy with up titration of his cardiac medications for congestive heart failure. He will need to be considered for ICD/biventricular pacing.   Assessment and Plan:   1. VT storm      Multiple ATP therapies, 3 defibrillations      Hypokalemia replaced/resolved      hypomagnesemia replaced  pt denies any kind of GI illness of late or losses  explained otherwise  Home meds:  lasix 10mg daily with spironolactone 12.5mg daily  Last ischemic evaluation 2013, though has not had any kind of symptomatology for angina  new echo is pending Pt with hx of hemochromatosis, would not continue amio at this time given (in the environment of an electrolyte imbalance Keep K+ >4.0 and Mag >2.0  2. CAD     No symptoms suggestive or at all of angina or progressive CAD     Given VT storm though would pursue      Will d/w Dr. Camnitz today stress testing vs cath  3. Charted hx reports PAFib     Patient/wife deny any known arrhythmia hx, never has been on a/c  4. Chronic CHF (systolic)      Appears well compensated by exam, xray, and optiVol     Monitor via his device   Signed, Renee Ursuy, PA-C 01/19/2016 8:20 AM    I have seen and examined this patient with Renee Ursuy.  Agree with above, note added to reflect my findings.  On exam, regular rhythm, no murmurs, lungs clear. Presented to the hospital with multiple episodes of VT and ICD shock x3. Felt well all day yesterday but felt swimmy headed prior to episodes.  Got 150 mg amiodarone in the ambulance.  No further VT today.  Plan for [facility] to evaluate for worsening of CAD.  Will start him on sotalol today.  If he requires pacing while on the sotalol, may have to switch to amiodarone but would like to avoid due to   his hemochromatosis and young age.    Will M. Camnitz MD 01/19/2016 1:34 PM      

## 2016-01-19 NOTE — Progress Notes (Signed)
Site area: Medical illustratort fem art Site Prior to Removal:  Level 0 Pressure Applied For:30 min Manual:   yes Patient Status During Pull:  A/O Post Pull Site:  Level 0 Post Pull Instructions Given:  Post instructions given and pt understands Dressing Applied:  tegaderm and a 4x4 Bedrest begins @ 17:20:00 Comments: Pt leaves Cath Lab Holding in stable condition. Rt groin unremarkable. Dressing is CDI.

## 2016-01-19 NOTE — Progress Notes (Signed)
Bedrest completed at 2120. Pt ambulated to bathroom and back to bed with no issues. Rt femoral site a level "0". Will continue to monitor and assess.

## 2016-01-19 NOTE — ED Provider Notes (Signed)
[facility]-EMERGENCY DEPT Provider Note   CSN: [id number] Arrival date & time: 01/19/16  0141  By signing my name below, I, Doreatha MartinEva Mathews, attest that this documentation has been prepared under the direction and in the presence of Derwood KaplanAnkit Ezell Poke, MD. Electronically Signed: Doreatha MartinEva Mathews, ED Scribe. 01/19/16. 2:57 AM.    History   Chief Complaint Chief Complaint  Patient presents with  . AICD Problem    HPI [name] is a 60 y.o. male brought in by ambulance with h/o PAF, [employer] ICD placement 06/2014 who presents to the Emergency Department complaining of multiple episodes of palpitations onset at 12:20AM. [employer] called nursing staff and reported 3 shocks today. He reports he felt all 3 shocks. Per pt, the first shock was administered approximately 5 minutes after he had gotten into bed for the evening. Pt states the first and second shocks were 10-15 minutes apart. He notes that the second shock woke him up from sleep and he found himself lying on the carpeted floor on his bedroom. He does not remember how he got to the floor. Pt reports the third shock was administered in the EMS. Pt reports this is the first time this has ever happened with his current device. He reports no recent new or increased activities. Pt takes daily 81 mg ASA, but no other anticoagulants. He has h/o CAD, ICM, CHF, CABG. He denies recent fever, SOB. He also denies any pain or injuries from the fall. No recent changes in medications.   The history is provided by the patient and the EMS personnel. No language interpreter was used.    Past Medical History:  Diagnosis Date  . AMI anterior wall (HCC) 2006  . Anemia   . Angina    "upon exertion"  . CHF (congestive heart failure) (HCC)   . Chronic systolic heart failure (HCC)    class 2-3  . Coronary artery disease   . Exertional dyspnea   . Hemochromatosis   . Hyperlipidemia   . ICD (implantable cardiac defibrillator) in place   . ICD-Medtronic-CRT 08/24/2011   . Ischemic cardiomyopathy    EF 36%; prior anterior MI, s/p CABG x 3; s/p cath Feb 2013 showing grafts to be patent with severe LV dysfunction  . Pacemaker   . PAF (paroxysmal atrial fibrillation) (HCC)   . 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 RBBB (right bundle branch block)   . Rotator cuff injury   . Sleep apnea    no cpap  . Type II diabetes mellitus [facility](HCC)     Patient Active Problem List   Diagnosis Date Noted  . VT (ventricular tachycardia) (HCC) 01/19/2016  . Postop check 02/19/2012  . Right inguinal hernia 01/02/2012  . Encounter for implantable defibrillator reprogramming or check 08/24/2011  . Chronic systolic heart failure (HCC)   . Diabetes mellitus type II, controlled (HCC) 10/07/2010  . Ischemic cardiomyopathy   . Hyperlipidemia   . RBBB (right bundle branch block)   . PAF (paroxysmal atrial fibrillation) (HCC)   . CAD (coronary artery disease)     Past Surgical History:  Procedure Laterality Date  . BI-VENTRICULAR IMPLANTABLE CARDIOVERTER DEFIBRILLATOR N/A 08/21/2011   Procedure: BI-VENTRICULAR IMPLANTABLE CARDIOVERTER DEFIBRILLATOR  (CRT-D);  Surgeon: Duke SalviaSteven C Klein, MD;  Location: [facility] CATH LAB;  Service: Cardiovascular;  Laterality: N/A;  . BIV ICD GENERTAOR CHANGE OUT N/A 06/12/2014   Procedure: BIV ICD GENERTAOR CHANGE OUT;  Surgeon: Duke SalviaSteven C Klein, MD;  [facility];  Service: Cardiovascular;  Laterality: N/A;  . CARDIAC CATHETERIZATION  03/12/2005   INFERIOR WALL AKINESIA. THERE IS GLOBAL HYPOKINESIA WITH OVERALL MODERATE TO SEVERE LEFT VENTRICULAR SYSTOLIC DYSFUNCTION. EF 35%  . CARDIAC CATHETERIZATION  Feb 2013   Grafts patent. EF is 20 to 25%.  Marland Kitchen CARDIAC CATHETERIZATION N/A 01/19/2016   Procedure: Left Heart Cath and Cors/Grafts Angiography;  Surgeon: Peter M Swaziland, MD;  Location: [facility] INVASIVE CV LAB;  Service: Cardiovascular;  Laterality: N/A;  . CARDIAC DEFIBRILLATOR PLACEMENT  08/21/11   "pacer/defib"  . CORONARY ARTERY  BYPASS GRAFT  2006   Emergent CABG x 3 with LIMA to LAD, SVG to OM, SVG to distal RCA per Dr. Tyrone Sage  . INGUINAL HERNIA REPAIR  02/09/2012   WITH MESH  . INGUINAL HERNIA REPAIR  02/09/2012   Procedure: HERNIA REPAIR INGUINAL ADULT;  Surgeon: Cherylynn Ridges, MD;  Location: [facility] OR;  Service: General;  Laterality: Right;  . INSERT / REPLACE / REMOVE PACEMAKER    . INSERTION OF MESH  02/09/2012   Procedure: INSERTION OF MESH;  Surgeon: Cherylynn Ridges, MD;  Location: [facility] OR;  Service: General;  Laterality: Right;  . JOINT REPLACEMENT     bil shoulders  . LEFT HEART CATHETERIZATION WITH CORONARY/GRAFT ANGIOGRAM N/A 05/25/2011   Procedure: LEFT HEART CATHETERIZATION WITH Isabel Caprice;  Surgeon: Peter M Swaziland, MD;  Location: [facility] CATH LAB;  Service: Cardiovascular;  Laterality: N/A;  . ROTATOR CUFF REPAIR  ?2000/~ 2007   left; right        Home Medications    Prior to Admission medications   Medication Sig Start Date End Date Taking? Authorizing Provider  aspirin 81 MG tablet Take 81 mg by mouth daily.   Yes Historical Provider, MD  carvedilol (COREG) 25 MG tablet TAKE 1/2 TABLET BY MOUTH EVERY MORNING AND 1 TABLET BY MOUTH EVERY EVENING. 10/25/15  Yes Duke Salvia, MD  fenofibrate 160 MG tablet TAKE 1 TABLET BY MOUTH DAILY. 10/25/15  Yes Duke Salvia, MD  furosemide (LASIX) 20 MG tablet TAKE 1/2 TABLET BY MOUTH DAILY. 11/19/15  Yes Peter M Swaziland, MD  lisinopril (PRINIVIL,ZESTRIL) 10 MG tablet Take 0.5 tablets (5 mg total) by mouth daily. 01/03/16  Yes Azalee Course, PA  metFORMIN (GLUCOPHAGE) 500 MG tablet Take 1,000 mg by mouth 2 (two) times daily with a meal.    Yes Historical Provider, MD  NITROSTAT 0.4 MG SL tablet PLACE 1 TABLET UNDER THE TONGUE EVERY 5 MINUTES AS NEEDED FOR CHEST PAIN FOR UP TO 3 DOSES. 04/28/15  Yes Rosalio Macadamia, NP  Omega-3 Fatty Acids (RA FISH OIL) 1400 MG CPDR Take 1,400 mg by mouth 2 (two) times daily.   Yes Historical Provider, MD  spironolactone (ALDACTONE)  25 MG tablet Take 0.5 tablets (12.5 mg total) by mouth daily. 07/06/15  Yes Dwana Melena, PA-C  [facility] VERIO test strip 1 each by Other route as needed for other.  09/29/15   Historical Provider, MD    Family History Family History  Problem Relation Age of Onset  . Adopted: Yes    Social History Social History  Substance Use Topics  . Smoking status: Former Smoker    Packs/day: 2.00    Years: 30.00    Types: Cigarettes    Quit date: 03/11/2005  . Smokeless tobacco: Never Used  . Alcohol use 1.8 oz/week    3 Cans of beer per week     Comment: weekly     Allergies  Statins and Zetia [ezetimibe]   Review of Systems Review of Systems A complete 10 system review of systems was obtained and all systems are negative except as noted in the HPI and PMH.    Physical Exam Updated Vital Signs BP (!) 107/56 (BP Location: Left Arm)   Pulse (!) 56   Temp 97.9 F (36.6 C) (Oral)   Resp 16   Ht 6' (1.829 m)   Wt 201 lb 1.6 oz (91.2 kg)   SpO2 97%   BMI 27.27 kg/m   Physical Exam  Constitutional: He appears well-developed and well-nourished.  HENT:  Head: Normocephalic.  Eyes: Conjunctivae are normal.  Neck: No JVD present.  No distended neck veins. No JVD.   Cardiovascular: Normal rate, regular rhythm and normal heart sounds.   RRR  Pulmonary/Chest: Effort normal and breath sounds normal. No respiratory distress. He has no wheezes. He has no rales.  Lungs CTA bilaterally.   Abdominal: He exhibits no distension.  Musculoskeletal: Normal range of motion.  Neurological: He is alert.  Skin: Skin is warm and dry.  Psychiatric: He has a normal mood and affect. His behavior is normal.  Nursing note and vitals reviewed.    ED Treatments / Results   DIAGNOSTIC STUDIES: Oxygen Saturation is 93% on [place] 4L/min, adequate by my interpretation.    COORDINATION OF CARE: 2:55 AM Discussed treatment plan with pt at bedside which includes cardiology consult and pt agreed to  plan.    Labs (all labs ordered are listed, but only abnormal results are displayed) Labs Reviewed  CBC - Abnormal; Notable for the following:       Result Value   RBC 3.99 (*)    HCT 37.0 (*)    All other components within normal limits  COMPREHENSIVE METABOLIC PANEL - Abnormal; Notable for the following:    Sodium 134 (*)    Potassium 3.1 (*)    CO2 19 (*)    Glucose, Bld 189 (*)    Creatinine, Ser 1.40 (*)    Total Protein 6.0 (*)    ALT 16 (*)    Alkaline Phosphatase 28 (*)    GFR calc non Af Amer 54 (*)    All other components within normal limits  MAGNESIUM - Abnormal; Notable for the following:    Magnesium 1.5 (*)    All other components within normal limits  CBC - Abnormal; Notable for the following:    RBC 3.98 (*)    HCT 37.2 (*)    All other components within normal limits  BASIC METABOLIC PANEL - Abnormal; Notable for the following:    Sodium 134 (*)    CO2 20 (*)    Glucose, Bld 149 (*)    Creatinine, Ser 1.27 (*)    All other components within normal limits  GLUCOSE, CAPILLARY - Abnormal; Notable for the following:    Glucose-Capillary 158 (*)    All other components within normal limits  GLUCOSE, CAPILLARY - Abnormal; Notable for the following:    Glucose-Capillary 116 (*)    All other components within normal limits  BASIC METABOLIC PANEL - Abnormal; Notable for the following:    Glucose, Bld 118 (*)    Creatinine, Ser 1.39 (*)    GFR calc non Af Amer 54 (*)    All other components within normal limits  CBC - Abnormal; Notable for the following:    RBC 4.00 (*)    HCT 37.6 (*)    All other components within  normal limits  CREATININE, SERUM - Abnormal; Notable for the following:    Creatinine, Ser 1.39 (*)    GFR calc non Af Amer 54 (*)    All other components within normal limits  GLUCOSE, CAPILLARY - Abnormal; Notable for the following:    Glucose-Capillary 124 (*)    All other components within normal limits  GLUCOSE, CAPILLARY - Abnormal;  Notable for the following:    Glucose-Capillary 133 (*)    All other components within normal limits  MRSA PCR SCREENING  PROTIME-INR  MAGNESIUM  MAGNESIUM  I-STAT TROPOININ, ED    EKG  EKG Interpretation  Date/Time:  Wednesday January 19 2016 01:53:29 EDT Ventricular Rate:  80 PR Interval:    QRS Duration: 128 QT Interval:  406 QTC Calculation: 469 R Axis:   -63 Text Interpretation:  Sinus rhythm Consider left atrial enlargement Nonspecific IVCD with LAD Probable inferior infarct, recent Anterolateral infarct, age indeterminate ST elevation in the inferior leafs and q waves in the inferior leads Nonspecific ST and T wave abnormality new chages seen Confirmed by Rhunette Croft, MD, Janey Genta [phone]) on 01/19/2016 3:26:09 AM Also confirmed by Rhunette Croft, MD, Janey Genta [phone]), editor [place]  CCT, [place] ([id number])  on 01/19/2016 7:30:45 AM       Radiology Dg Chest Portable 1 View  Result Date: 01/19/2016 CLINICAL DATA:  Acute onset of dizziness. Defibrillator fired 3 times today. Initial encounter. EXAM: PORTABLE CHEST 1 VIEW COMPARISON:  Chest radiograph performed 08/22/2011 FINDINGS: The lungs are well-aerated. Minimal left basilar atelectasis is noted. There is no evidence of pleural effusion or pneumothorax. The cardiomediastinal silhouette is borderline normal in size. The patient is status post median sternotomy, with evidence of prior CABG. A pacemaker/AICD is noted overlying the left chest wall, with leads ending overlying the right atrium, right ventricle and coronary sinus. No acute osseous abnormalities are seen. IMPRESSION: Minimal left basilar atelectasis noted.  Lungs otherwise clear. Electronically Signed   By: Roanna Raider M.D.   On: 01/19/2016 02:37    Procedures .Critical Care Performed by: Derwood Kaplan Authorized by: Derwood Kaplan   Critical care provider statement:    Critical care time (minutes):  38   Critical care time was exclusive of:  Separately billable  procedures and treating other patients   Critical care was necessary to treat or prevent imminent or life-threatening deterioration of the following conditions:  Cardiac failure   Critical care was time spent personally by me on the following activities:  Blood draw for specimens, development of treatment plan with patient or surrogate, discussions with consultants, examination of patient, obtaining history from patient or surrogate, ordering and performing treatments and interventions, ordering and review of laboratory studies, ordering and review of radiographic studies, re-evaluation of patient's condition, pulse oximetry and review of old charts   (including critical care time)  Medications Ordered in ED Medications  lisinopril (PRINIVIL,ZESTRIL) tablet 5 mg ( Oral MAR Unhold 01/19/16 1739)  furosemide (LASIX) tablet 10 mg ( Oral MAR Unhold 01/19/16 1739)  carvedilol (COREG) tablet 25 mg ( Oral MAR Unhold 01/19/16 1739)  fenofibrate tablet 160 mg ( Oral MAR Unhold 01/19/16 1739)  spironolactone (ALDACTONE) tablet 12.5 mg ( Oral MAR Unhold 01/19/16 1739)  aspirin chewable tablet 81 mg ( Oral MAR Unhold 01/19/16 1739)  omega-3 acid ethyl esters (LOVAZA) capsule 1,000 mg ( Oral MAR Unhold 01/19/16 1739)  nitroGLYCERIN (NITROSTAT) SL tablet 0.4 mg ( Sublingual MAR Unhold 01/19/16 1739)  insulin aspart (novoLOG) injection 0-9 Units ( Subcutaneous MAR Unhold  01/19/16 1739)  sotalol (BETAPACE) tablet 120 mg (120 mg Oral Given 01/19/16 2139)  sodium chloride flush (NS) 0.9 % injection 3 mL (3 mLs Intravenous Given 01/19/16 2140)  sodium chloride flush (NS) 0.9 % injection 3 mL (not administered)  0.9 %  sodium chloride infusion (not administered)  acetaminophen (TYLENOL) tablet 650 mg (not administered)  ondansetron (ZOFRAN) injection 4 mg (not administered)  enoxaparin (LOVENOX) injection 40 mg (40 mg Subcutaneous Not Given 01/20/16 0800)  0.9% sodium chloride infusion (0 mL/kg/hr  95.2 kg  Intravenous Stopped 01/19/16 2015)  ALPRAZolam (XANAX) tablet 0.25 mg (0.25 mg Oral Given 01/19/16 2139)  potassium chloride (K-DUR,KLOR-CON) CR tablet 40 mEq (40 mEq Oral Given 01/19/16 0553)  magnesium sulfate IVPB 2 g 50 mL (2 g Intravenous New Bag/Given 01/19/16 0618)     Initial Impression / Assessment and Plan / ED Course  I have reviewed the triage vital signs and the nursing notes.  Pertinent labs & imaging results that were available during my care of the patient were reviewed by me and considered in my medical decision making (see chart for details).  Clinical Course    Pt comes in after AICD firing due to [facility].. He had syconope. K corrected by Cards -who will admit.   Final Clinical Impressions(s) / ED Diagnoses   Final diagnoses:  V-tach [facility])  AICD discharge    New Prescriptions Current Discharge Medication List         Derwood Kaplan, MD 01/20/16 [phone]

## 2016-01-19 NOTE — ED Triage Notes (Signed)
Patient here by gc ems ofr defib firing x 2 at home and patient woke up on floor, with ems pt defib fired an additional 2 times and rhythm was vtach at time of firing. Pt reported feeling dizzy during vtach episodes, pt given amiodarone 150 mg by ems.

## 2016-01-19 NOTE — ED Notes (Signed)
Cardiology here to see pt.

## 2016-01-19 NOTE — H&P (View-Only) (Signed)
ELECTROPHYSIOLOGY CONSULT NOTE    Patient ID: [name] MRN: [id number], DOB/AGE: [id number] y.o.  Admit date: 01/19/2016 Date of Consult: 01/19/2016   Primary Physician: Minda Meo, MD Primary Cardiologist: Dr. Swaziland Electrophysiologist: Dr. Graciela Husbands  Reason for Consultation: VT storm, multiple ICD therapies  HPI: [name] is a 60 y.o. male with PMHx of CAD, ICM/chronic (systolic) CHF w/CRT-D, PAFib, DM, RBBB, HLD statin intolerant as per Dr. Elvis Coil notes.  He comes to [facility] admitted after being shocked 3x.   The patient has been feeling very well, he denies any kind of CP, exertional intolerances or illness of late.  He reports that he was sitting in his recliner, about MN and got up to head to bed.  He started feeling "swimmy" in the head and got to his bed and laid down.  A few minutes later felt the shock.  He got up to go to his wife to let her know and once at the doorway felt a shock again, was disoriented finding he had falln to the floor, thinks he may have blacked out a second though felt the second shock, EMS was called and he received a third shock en-route.  He denies any kind of CP outside of the shocks, no overt sensations of palpitations, but was lightheaded.    He was noted to have VT (260-257ms cycle length) on his ICD interrogation, total of 9 therapies, 6 with successful ATP's, 3 required defibrillation. Optivol looks well compensated Battery/lead status' stable No other episodes outside of 01/19/16 except for the July date that was deemed inappropriate with external noise    LABS: K+ 3.1 > 4.3 Mag 1.5 replaced w/2g BUN/creat 18/1.40 > 17/1.27 H/H 13/37 WBC 7.6 plts 197 Trop 0.00    DEVICE HISTORY: MDT CRT-D, LV lead programmed off 2/2 high output and RBBB  implanted 08/21/11, gen change 06/12/14, Dr. Graciela Husbands, primary prevention, CHF No hx of ADD therapy or previous appropriate therapies *10/2015 received inappropriate tx for noise  (external) while in a poorly grounded pool) *At his last visit 11/18/15 w/Amber Glory Buff, ARNP she noted: pt has intrinsic conduction but RV lead is programmed to pace.  was left programmed as is as it looked like per Dr Odessa Fleming note this was intentional with RBBB   Past Medical History:  Diagnosis Date  . AMI anterior wall (HCC) 2006  . Anemia   . Angina    "upon exertion"  . CHF (congestive heart failure) (HCC)   . Chronic systolic heart failure (HCC)    class 2-3  . Coronary artery disease   . Exertional dyspnea   . Hemochromatosis   . Hyperlipidemia   . ICD (implantable cardiac defibrillator) in place   . ICD-Medtronic-CRT 08/24/2011  . Ischemic cardiomyopathy    EF 36%; prior anterior MI, s/p CABG x 3; s/p cath Feb 2013 showing grafts to be patent with severe LV dysfunction  . Pacemaker   . PAF (paroxysmal atrial fibrillation) (HCC)   . 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 RBBB (right bundle branch block)   . Rotator cuff injury   . Sleep apnea    no cpap  . Type II diabetes mellitus (HCC)      Surgical History:  Past Surgical History:  Procedure Laterality Date  . BI-VENTRICULAR IMPLANTABLE CARDIOVERTER DEFIBRILLATOR N/A 08/21/2011   Procedure: BI-VENTRICULAR IMPLANTABLE CARDIOVERTER DEFIBRILLATOR  (CRT-D);  Surgeon: Duke Salvia, MD;  Location: [facility] CATH LAB;  Service: Cardiovascular;  Laterality:  N/A;  . BIV ICD ZOXWRUEAV CHANGE OUT N/A 06/12/2014   Procedure: BIV ICD GENERTAOR CHANGE OUT;  Surgeon: Duke Salvia, MD;  Location: [facility] CATH LAB;  Service: Cardiovascular;  Laterality: N/A;  . CARDIAC CATHETERIZATION  03/12/2005   INFERIOR WALL AKINESIA. THERE IS GLOBAL HYPOKINESIA WITH OVERALL MODERATE TO SEVERE LEFT VENTRICULAR SYSTOLIC DYSFUNCTION. EF 35%  . CARDIAC CATHETERIZATION  Feb 2013   Grafts patent. EF is 20 to 25%.  Marland Kitchen CARDIAC DEFIBRILLATOR PLACEMENT  08/21/11   "pacer/defib"  . CORONARY ARTERY BYPASS GRAFT  2006   Emergent CABG x 3 with LIMA to  LAD, SVG to OM, SVG to distal RCA per Dr. Tyrone Sage  . INGUINAL HERNIA REPAIR  02/09/2012   WITH MESH  . INGUINAL HERNIA REPAIR  02/09/2012   Procedure: HERNIA REPAIR INGUINAL ADULT;  Surgeon: Cherylynn Ridges, MD;  Location: [facility] OR;  Service: General;  Laterality: Right;  . INSERT / REPLACE / REMOVE PACEMAKER    . INSERTION OF MESH  02/09/2012   Procedure: INSERTION OF MESH;  Surgeon: Cherylynn Ridges, MD;  Location: [facility] OR;  Service: General;  Laterality: Right;  . JOINT REPLACEMENT     bil shoulders  . LEFT HEART CATHETERIZATION WITH CORONARY/GRAFT ANGIOGRAM N/A 05/25/2011   Procedure: LEFT HEART CATHETERIZATION WITH Isabel Caprice;  Surgeon: Peter M Swaziland, MD;  Location: [facility] CATH LAB;  Service: Cardiovascular;  Laterality: N/A;  . ROTATOR CUFF REPAIR  ?2000/~ 2007   left; right      Prescriptions Prior to Admission  Medication Sig Dispense Refill Last Dose  . aspirin 81 MG tablet Take 81 mg by mouth daily.   01/18/2016 at Unknown time  . carvedilol (COREG) 25 MG tablet TAKE 1/2 TABLET BY MOUTH EVERY MORNING AND 1 TABLET BY MOUTH EVERY EVENING. 135 tablet 2 01/18/2016 at 1800  . fenofibrate 160 MG tablet TAKE 1 TABLET BY MOUTH DAILY. 90 tablet 2 01/18/2016 at Unknown time  . furosemide (LASIX) 20 MG tablet TAKE 1/2 TABLET BY MOUTH DAILY. 30 tablet 11 01/18/2016 at Unknown time  . lisinopril (PRINIVIL,ZESTRIL) 10 MG tablet Take 0.5 tablets (5 mg total) by mouth daily. 30 tablet 11 01/18/2016 at Unknown time  . metFORMIN (GLUCOPHAGE) 500 MG tablet Take 1,000 mg by mouth 2 (two) times daily with a meal.    01/18/2016 at Unknown time  . NITROSTAT 0.4 MG SL tablet PLACE 1 TABLET UNDER THE TONGUE EVERY 5 MINUTES AS NEEDED FOR CHEST PAIN FOR UP TO 3 DOSES. 25 tablet 3 unknown  . Omega-3 Fatty Acids (RA FISH OIL) 1400 MG CPDR Take 1,400 mg by mouth 2 (two) times daily.   01/18/2016 at Unknown time  . spironolactone (ALDACTONE) 25 MG tablet Take 0.5 tablets (12.5 mg total) by mouth daily. 30 tablet  11 01/18/2016 at Unknown time  . ONETOUCH VERIO test strip 1 each by Other route as needed for other.   6 Taking    Inpatient Medications:  . aspirin  81 mg Oral Daily  . carvedilol  25 mg Oral BID WC  . enoxaparin (LOVENOX) injection  40 mg Subcutaneous Q24H  . fenofibrate  160 mg Oral Daily  . furosemide  10 mg Oral Daily  . lisinopril  5 mg Oral Daily  . metFORMIN  1,000 mg Oral BID WC  . omega-3 acid ethyl esters  1,000 mg Oral Daily  . spironolactone  12.5 mg Oral Daily    Allergies:  Allergies  Allergen Reactions  . Statins Other (See  Comments)    Myalgias   . Zetia [Ezetimibe] Other (See Comments)    myalgias    Social History   Social History  . Marital status: Married    Spouse name: N/A  . Number of children: 1  . Years of education: N/A   Occupational History  . telephone service    Social History Main Topics  . Smoking status: Former Smoker    Packs/day: 2.00    Years: 30.00    Types: Cigarettes    Quit date: 03/11/2005  . Smokeless tobacco: Never Used  . Alcohol use 1.8 oz/week    3 Cans of beer per week     Comment: weekly  . Drug use: No  . Sexual activity: Not Currently   Other Topics Concern  . Not on file   Social History Narrative  . No narrative on file     Family History  Problem Relation Age of Onset  . Adopted: Yes     Review of Systems: All other systems reviewed and are otherwise negative except as noted above.  Physical Exam: Vitals:   01/19/16 0500 01/19/16 0530 01/19/16 0600 01/19/16 0645  BP: 118/61 105/61 116/66 114/69  Pulse: 78 72 73 71  Resp: 16 13 15 12   Temp:    98 F (36.7 C)  TempSrc:    Oral  SpO2: 95% 96% 95% 95%  Weight:    209 lb 14.1 oz (95.2 kg)  Height:    6' (1.829 m)    GEN- The patient is well appearing, alert and oriented x 3 today.   HEENT: normocephalic, atraumatic; sclera clear, conjunctiva pink; hearing intact; oropharynx clear; neck supple, no JVP Lymph- no cervical  lymphadenopathy Lungs- Clear to ausculation bilaterally, normal work of breathing.  No wheezes, rales, rhonchi Heart- Regular rate and rhythm, no murmurs, rubs or gallops, PMI not laterally displaced GI- soft, non-tender, non-distended Extremities- no clubbing, cyanosis, or edema MS- no significant deformity or atrophy Skin- warm and dry, no rash or lesion Psych- euthymic mood, full affect Neuro- no gross deficits observed  Labs:   Lab Results  Component Value Date   WBC 6.8 01/19/2016   HGB 13.0 01/19/2016   HCT 37.2 (L) 01/19/2016   MCV 93.5 01/19/2016   PLT 218 01/19/2016    Recent Labs Lab 01/19/16 0206 01/19/16 0703  NA 134* 134*  K 3.1* 4.3  CL 102 103  CO2 19* 20*  BUN 18 17  CREATININE 1.40* 1.27*  CALCIUM 9.0 9.0  PROT 6.0*  --   BILITOT 1.0  --   ALKPHOS 28*  --   ALT 16*  --   AST 17  --   GLUCOSE 189* 149*      Radiology/Studies:  Dg Chest Portable 1 View Result Date: 01/19/2016 CLINICAL DATA:  Acute onset of dizziness. Defibrillator fired 3 times today. Initial encounter. EXAM: PORTABLE CHEST 1 VIEW COMPARISON:  Chest radiograph performed 08/22/2011 FINDINGS: The lungs are well-aerated. Minimal left basilar atelectasis is noted. There is no evidence of pleural effusion or pneumothorax. The cardiomediastinal silhouette is borderline normal in size. The patient is status post median sternotomy, with evidence of prior CABG. A pacemaker/AICD is noted overlying the left chest wall, with leads ending overlying the right atrium, right ventricle and coronary sinus. No acute osseous abnormalities are seen. IMPRESSION: Minimal left basilar atelectasis noted.  Lungs otherwise clear. Electronically Signed   By: Roanna RaiderJeffery  Chang M.D.   On: 01/19/2016 02:37    EKG: SR,  V paced TELEMETRY: SR, occ PVC, couplet  07/01/14: TTE Study Conclusions - Left ventricle: The cavity size was normal. Wall thickness was normal. Systolic function was moderately reduced. The  estimated ejection fraction was in the range of 35% to 40%. Diffuse hypokinesis, the apex and periapical segments appeared most abnormal. Doppler parameters are consistent with abnormal left ventricular relaxation (grade 1 diastolic dysfunction). - Aortic valve: There was no stenosis. - Mitral valve: There was trivial regurgitation. - Left atrium: The atrium was mildly dilated. - Right ventricle: Poorly visualized. The cavity size was normal. Pacer wire or catheter noted in right ventricle. Systolic function was mildly reduced. - Pulmonary arteries: No complete TR doppler jet so unable to estimate PA systolic pressure. - Inferior vena cava: The vessel was normal in size. The respirophasic diameter changes were in the normal range (= 50%), consistent with normal central venous pressure. Impressions: - Technically difficult study with poor acoustic windows. Echo contrast may have helped but was not used (would use in future). Normal LV size with EF 35-40%. Diffuse hypokinesis, worse in the apex and peri-apical segments. Poorly visualized RV but probably normal size and mildly decreased systolic function.\  05/25/11: [facility] Final Conclusions:   1. Severe 3 vessel obstructive atherosclerotic coronary disease. 2. Patent saphenous vein graft to the distal right coronary and patent saphenous vein graft to the second obtuse marginal vessel. These grafts are very small in caliber due to small target vessels. 3. Patent LIMA graft to LAD. 4. Severe left ventricular dysfunction.  Recommendations:  recommend aggressive medical therapy with up titration of his cardiac medications for congestive heart failure. He Shereece Wellborn need to be considered for ICD/biventricular pacing.   Assessment and Plan:   1. VT storm      Multiple ATP therapies, 3 defibrillations      Hypokalemia replaced/resolved      hypomagnesemia replaced  pt denies any kind of GI illness of late or losses  explained otherwise  Home meds:  lasix 10mg  daily with spironolactone 12.5mg  daily  Last ischemic evaluation 2013, though has not had any kind of symptomatology for angina  new echo is pending Pt with hx of hemochromatosis, would not continue amio at this time given (in the environment of an electrolyte imbalance Keep K+ >4.0 and Mag >2.0  2. CAD     No symptoms suggestive or at all of angina or progressive CAD     Given VT storm though would pursue      Cinsere Mizrahi d/w Dr. Elberta Fortis today stress testing vs cath  3. Charted hx reports PAFib     Patient/wife deny any known arrhythmia hx, never has been on a/c  4. Chronic CHF (systolic)      Appears well compensated by exam, xray, and optiVol     Monitor via his device   Signed, Francis Dowse, PA-C 01/19/2016 8:20 AM    I have seen and examined this patient with Francis Dowse.  Agree with above, note added to reflect my findings.  On exam, regular rhythm, no murmurs, lungs clear. Presented to the hospital with multiple episodes of VT and ICD shock x3. Felt well all day yesterday but felt swimmy headed prior to episodes.  Got 150 mg amiodarone in the ambulance.  No further VT today.  Plan for [facility] to evaluate for worsening of CAD.  Kaley Jutras start him on sotalol today.  If he requires pacing while on the sotalol, may have to switch to amiodarone but would like to avoid due to  his hemochromatosis and young age.    Gissel Keilman M. Tylee Yum MD 01/19/2016 1:34 PM

## 2016-01-19 NOTE — Progress Notes (Signed)
Patient arrived to room 4 east 20 from emergency room.Assisted to bed by nursing staff.Patient placed on telemetry monitor and blood sugar checked .Patient denies pain at present time complains of soreness in left forearm and shoulder.Will continue to monitor.Oriented patient to nursing unit.Will report off to oncoming shift to complete HP and assessment.
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
## 2016-01-19 NOTE — Interval H&P Note (Signed)
History and Physical Interval Note:  01/19/2016 3:37 PM  Eliott NineLeslie A Schatz  has presented today for surgery, with the diagnosis of cp  The various methods of treatment have been discussed with the patient and family. After consideration of risks, benefits and other options for treatment, the patient has consented to  Procedure(s): Left Heart Cath and Coronary Angiography (N/A) as a surgical intervention .  The patient's history has been reviewed, patient examined, no change in status, stable for surgery.  I have reviewed the patient's chart and labs.  Questions were answered to the patient's satisfaction.   Cath Lab Visit (complete for each Cath Lab visit)  Clinical Evaluation Leading to the Procedure:   ACS: No.  Non-ACS:    Anginal Classification: CCS I  Anti-ischemic medical therapy: Maximal Therapy (2 or more classes of medications)  Non-Invasive Test Results: No non-invasive testing performed  Prior CABG: Previous CABG        Theron Aristaeter [facility] Va [facility] CenterJordanMD,FACC 01/19/2016 3:37 PM

## 2016-01-19 NOTE — Progress Notes (Signed)
CCMD reported pt had a 6 beat run of VTACH. Upon assessing pt, pt was in the middle of getting labs drawn by phlebotomist. Pt was asymptomatic, chest pain free, and VSS. On call cardiologist notified of event. MD would to like to just keep monitoring at this time.

## 2016-01-19 NOTE — ED Notes (Signed)
Awaiting cardiology to see pt

## 2016-01-20 ENCOUNTER — Encounter (HOSPITAL_COMMUNITY): Payer: Self-pay | Admitting: Cardiology

## 2016-01-20 LAB — MAGNESIUM: Magnesium: 1.9 mg/dL (ref 1.7–2.4)

## 2016-01-20 LAB — BASIC METABOLIC PANEL
Anion gap: 8 (ref 5–15)
BUN: 16 mg/dL (ref 6–20)
CHLORIDE: 104 mmol/L (ref 101–111)
CO2: 26 mmol/L (ref 22–32)
Calcium: 9 mg/dL (ref 8.9–10.3)
Creatinine, Ser: 1.39 mg/dL — ABNORMAL HIGH (ref 0.61–1.24)
GFR calc Af Amer: >60 mL/min (ref >60)
GFR calc non Af Amer: 54 mL/min — ABNORMAL LOW (ref >60)
GLUCOSE: 118 mg/dL — AB (ref 65–99)
POTASSIUM: 3.9 mmol/L (ref 3.5–5.1)
Sodium: 138 mmol/L (ref 135–145)

## 2016-01-20 LAB — GLUCOSE, CAPILLARY
Glucose-Capillary: 133 mg/dL — ABNORMAL HIGH (ref 65–99)
Glucose-Capillary: 125 mg/dL — ABNORMAL HIGH (ref 65–99)
Glucose-Capillary: 195 mg/dL — ABNORMAL HIGH (ref 65–99)
Glucose-Capillary: 131 mg/dL — ABNORMAL HIGH (ref 65–99)

## 2016-01-20 MED ORDER — FENOFIBRATE 160 MG PO TABS
160.0000 mg | ORAL_TABLET | Freq: Every day | ORAL | Status: DC
  Administered 2016-01-20: 160 mg via ORAL
  Filled 2016-01-20: qty 1

## 2016-01-20 MED ORDER — MAGNESIUM OXIDE 400 (241.3 MG) MG PO TABS
400.0000 mg | ORAL_TABLET | Freq: Every day | ORAL | Status: DC
  Administered 2016-01-20 – 2016-01-21 (×2): 400 mg via ORAL
  Filled 2016-01-20 (×2): qty 1

## 2016-01-20 MED ORDER — SPIRONOLACTONE 25 MG PO TABS
25.0000 mg | ORAL_TABLET | Freq: Every day | ORAL | Status: DC
  Administered 2016-01-21: 25 mg via ORAL
  Filled 2016-01-20: qty 1

## 2016-01-20 NOTE — Care Management Note (Signed)
Case Management Note  Patient Details  Name: Eliott NineLeslie A Tewell MRN: [id number] Date of Birth: [birth year]  Subjective/Objective: Pt presented for   ICD shock x 3 after VT episode. LHC completed 01-19-16 to evaluate if pt has worsening CAD.                  Action/Plan: Plan is to initiate Sotalol. No needs identified by CM at this time. CM will continue to monitor.  Expected Discharge Date:                  Expected Discharge Plan:  Home/Self Care  In-House Referral:  NA  Discharge planning Services  CM Consult  Post Acute Care Choice:  NA Choice offered to:  NA  DME Arranged:  N/A DME Agency:  NA  HH Arranged:  NA HH Agency:  NA  Status of Service:  Completed, signed off  If discussed at Long Length of Stay Meetings, dates discussed:    Additional Comments:  Gala LewandowskyGraves-Bigelow, Knox Holdman Kaye, RN 01/20/2016, 11:40 AM
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
## 2016-01-21 ENCOUNTER — Encounter (HOSPITAL_COMMUNITY): Payer: Self-pay | Admitting: Physician Assistant

## 2016-01-21 LAB — BASIC METABOLIC PANEL
ANION GAP: 7 (ref 5–15)
BUN: 19 mg/dL (ref 6–20)
CO2: 26 mmol/L (ref 22–32)
Calcium: 9.2 mg/dL (ref 8.9–10.3)
Chloride: 104 mmol/L (ref 101–111)
Creatinine, Ser: 1.56 mg/dL — ABNORMAL HIGH (ref 0.61–1.24)
GFR, EST AFRICAN AMERICAN: 54 mL/min — AB (ref >60)
GFR, EST NON AFRICAN AMERICAN: 47 mL/min — AB (ref >60)
GLUCOSE: 142 mg/dL — AB (ref 65–99)
POTASSIUM: 4 mmol/L (ref 3.5–5.1)
SODIUM: 137 mmol/L (ref 135–145)

## 2016-01-21 LAB — GLUCOSE, CAPILLARY
Glucose-Capillary: 156 mg/dL — ABNORMAL HIGH (ref 65–99)
Glucose-Capillary: 159 mg/dL — ABNORMAL HIGH (ref 65–99)
Glucose-Capillary: 139 mg/dL — ABNORMAL HIGH (ref 65–99)

## 2016-01-21 LAB — MAGNESIUM: MAGNESIUM: 1.9 mg/dL (ref 1.7–2.4)

## 2016-01-21 MED ORDER — CARVEDILOL 25 MG PO TABS: 25.0000 mg | ORAL_TABLET | Freq: Two times a day (BID) | ORAL | 2 refills | Status: DC

## 2016-01-21 MED ORDER — SPIRONOLACTONE 25 MG PO TABS: 25.0000 mg | ORAL_TABLET | Freq: Every day | ORAL | 11 refills | Status: DC

## 2016-01-21 MED ORDER — SOTALOL HCL 120 MG PO TABS: 120.0000 mg | ORAL_TABLET | Freq: Two times a day (BID) | ORAL | 11 refills | Status: DC

## 2016-01-21 MED ORDER — MAGNESIUM OXIDE 400 (241.3 MG) MG PO TABS: 400.0000 mg | ORAL_TABLET | Freq: Every day | ORAL | 11 refills | Status: DC

## 2016-01-21 NOTE — Plan of Care (Signed)
Problem: Education: Goal: Knowledge of [facility] Education information/materials will improve Outcome: Completed/Met Date Met: 01/21/16 Pt educated throughout entire admission regarding tests, procedures, medications, and available resources.

## 2016-01-21 NOTE — Discharge Summary (Signed)
Discharge Summary    Patient ID: [name],  MRN: [id number], DOB/AGE: 60-06-1955 60 y.o.  Admit date: 01/19/2016 Discharge date: 01/22/2016  Primary Care Provider: ARONSON,RICHARD A Primary Cardiologist: Dr SwazilandJordan, Dr Graciela HusbandsKlein  Discharge Diagnoses    Principal Problem:   Ventricular tachycardia, sustained [facility](HCC) Active Problems:   CAD (coronary artery disease)   Chronic systolic heart failure (HCC)   Allergies Allergies  Allergen Reactions  . Statins Other (See Comments)    Myalgias   . Zetia [Ezetimibe] Other (See Comments)    myalgias    Diagnostic Studies/Procedures    CATH: 10/11  Prox LAD to Mid LAD lesion, 85 %stenosed.  Ost Ramus to Ramus lesion, 55 %stenosed.  Prox Cx to Mid Cx lesion, 100 %stenosed.  Ost RCA to Dist RCA lesion, 100 %stenosed.  SVG graft was visualized by angiography and is small.  SVG graft was visualized by angiography and is small.  LIMA graft was visualized by angiography and is large and anatomically normal.  There is moderate left ventricular systolic dysfunction.  LV end diastolic pressure is moderately elevated.  The left ventricular ejection fraction is 35-45% by visual estimate.  1. Severe 3 vessel obstructive CAD 2. Large patent LIMA to the LAD 3. Patent SVG to OM2. This is a tiny graft supplying a very small OM  4. Patent SVG to PDA. This is also a tiny graft supplying a very tiny PDA 5. Moderate LV dysfunction. 6. Elevated LV EDP Plan: medical therapy. Antiarrhythmic drug therapy per EP team. _____________   History of Present Illness     60 y.o.malewith PMH of CAD s/p CABG x 3, type II DM, HLD, chronic systolic HF and ischemic cardiomyoapthy with EF 35-40% s/p Medtronic CRT-D. He also has hemochromatosis, right bundle branch block, PAF and OSA. He had 2 ICD shocks at home, came in by EMS and was having VT.   Hospital Course     Consultants: EP   He was mildly hypokalemic and hypomagnesemic,  supplement ordered. He was started on sotalol and cardiac cath was performed.  Cath results are above. Medical therapy was planned. HIs aldactone was increased to combat the hypokalemia and he is encouraged to eat foods with potassium in them. He was started on magnesium supplement.   He tolerated the sotalol well. His Coreg was increased and he tolerated the 2 medications together. His arrhythmia improved. His volume status was good and he was having no ischemic symptoms.  On 10/13, he was seen by Dr Graciela HusbandsKlein and all data were reviewed. No further inpatient workup was indicated and he was considered stable for discharge, to follow up as and outpatient.  _____________  Discharge Vitals Blood pressure (!) 93/48, pulse 69, temperature 98.5 F (36.9 C), temperature source Oral, resp. rate 18, height 6' (1.829 m), weight 202 lb 11.2 oz (91.9 kg), SpO2 94 %.  Filed Weights   01/19/16 0645 01/20/16 0525 01/21/16 0650  Weight: 209 lb 14.1 oz (95.2 kg) 201 lb 1.6 oz (91.2 kg) 202 lb 11.2 oz (91.9 kg)    Labs & Radiologic Studies    CBC  Recent Labs  01/19/16 1936  WBC 5.5  HGB 13.2  HCT 37.6*  MCV 94.0  PLT 215   Basic Metabolic Panel  Recent Labs  01/20/16 0406 01/21/16 0241  NA 138 137  K 3.9 4.0  CL 104 104  CO2 26 26  GLUCOSE 118* 142*  BUN 16 19  CREATININE 1.39* 1.56*  CALCIUM  9.0 9.2  MG 1.9 1.9   Liver Function Tests Lab Results  Component Value Date   ALT 16 (L) 01/19/2016   AST 17 01/19/2016   ALKPHOS 28 (L) 01/19/2016   BILITOT 1.0 01/19/2016   Magnesium  Date Value Ref Range Status  01/21/2016 1.9 1.7 - 2.4 mg/dL Final   _____________  Dg Chest Portable 1 View  Result Date: 01/19/2016 CLINICAL DATA:  Acute onset of dizziness. Defibrillator fired 3 times today. Initial encounter. EXAM: PORTABLE CHEST 1 VIEW COMPARISON:  Chest radiograph performed 08/22/2011 FINDINGS: The lungs are well-aerated. Minimal left basilar atelectasis is noted. There is no  evidence of pleural effusion or pneumothorax. The cardiomediastinal silhouette is borderline normal in size. The patient is status post median sternotomy, with evidence of prior CABG. A pacemaker/AICD is noted overlying the left chest wall, with leads ending overlying the right atrium, right ventricle and coronary sinus. No acute osseous abnormalities are seen. IMPRESSION: Minimal left basilar atelectasis noted.  Lungs otherwise clear. Electronically Signed   By: Roanna Raider M.D.   On: 01/19/2016 02:37   Disposition   Pt is being discharged home today in good condition.  Follow-up Plans & Appointments    Follow-up Information    Sherryl Manges, MD .   Specialty:  Cardiology Why:  The office will call. Contact information: [address] [place] [address] [phone]        Peter Swaziland, MD .   Specialty:  Cardiology Why:  The office will call. Contact information: [address] [place] [address] [phone]          Discharge Instructions    (HEART FAILURE PATIENTS) Call MD:  Anytime you have any of the following symptoms: 1) 3 pound weight gain in 24 hours or 5 pounds in 1 week 2) shortness of breath, with or without a dry hacking cough 3) swelling in the hands, feet or stomach 4) if you have to sleep on extra pillows at night in order to breathe.    Complete by:  As directed    Diet - low sodium heart healthy    Complete by:  As directed    Diabetic   Increase activity slowly    Complete by:  As directed       Discharge Medications   Discharge Medication List as of 01/21/2016  6:11 PM    START taking these medications   Details  magnesium oxide (MAG-OX) 400 (241.3 Mg) MG tablet Take 1 tablet (400 mg total) by mouth daily., Starting Sat 01/22/2016, Normal    sotalol (BETAPACE) 120 MG tablet Take 1 tablet (120 mg total) by mouth 2 (two) times daily., Starting Fri 01/21/2016, Normal      CONTINUE these medications which  have CHANGED   Details  carvedilol (COREG) 25 MG tablet Take 1 tablet (25 mg total) by mouth 2 (two) times daily with a meal., Starting Fri 01/21/2016, Normal    spironolactone (ALDACTONE) 25 MG tablet Take 1 tablet (25 mg total) by mouth daily., Starting Fri 01/21/2016, Normal      CONTINUE these medications which have NOT CHANGED   Details  aspirin 81 MG tablet Take 81 mg by mouth daily., Until Discontinued, Historical Med    fenofibrate 160 MG tablet TAKE 1 TABLET BY MOUTH DAILY., Normal    furosemide (LASIX) 20 MG tablet TAKE 1/2 TABLET BY MOUTH DAILY., Normal    lisinopril (PRINIVIL,ZESTRIL) 10 MG tablet Take 0.5 tablets (5 mg total)  by mouth daily., Starting Mon 01/03/2016, Normal    metFORMIN (GLUCOPHAGE) 500 MG tablet Take 1,000 mg by mouth 2 (two) times daily with a meal. , Until Discontinued, Historical Med    NITROSTAT 0.4 MG SL tablet PLACE 1 TABLET UNDER THE TONGUE EVERY 5 MINUTES AS NEEDED FOR CHEST PAIN FOR UP TO 3 DOSES., Normal    Omega-3 Fatty Acids (RA FISH OIL) 1400 MG CPDR Take 1,400 mg by mouth 2 (two) times daily., Until Discontinued, Historical Med    ONETOUCH VERIO test strip 1 each by Other route as needed for other. , Starting Wed 09/29/2015, Historical Med          Outstanding Labs/Studies   None  Duration of Discharge Encounter   Greater than 30 minutes including physician time.  Melida Quitter NP 01/22/2016, 12:55 PM

## 2016-01-21 NOTE — Progress Notes (Signed)
SUBJECTIVE: The patient is doing well today.  At this time, he denies chest pain, shortness of breath, or any new concerns.  He wants to go home.  Marland Kitchen aspirin  81 mg Oral Daily  . carvedilol  25 mg Oral BID WC  . enoxaparin (LOVENOX) injection  40 mg Subcutaneous Q24H  . fenofibrate  160 mg Oral Daily  . furosemide  10 mg Oral Daily  . insulin aspart  0-9 Units Subcutaneous TID WC  . lisinopril  5 mg Oral Daily  . magnesium oxide  400 mg Oral Daily  . omega-3 acid ethyl esters  1,000 mg Oral Daily  . sodium chloride flush  3 mL Intravenous Q12H  . sotalol  120 mg Oral Q12H  . spironolactone  25 mg Oral Daily      OBJECTIVE: Physical Exam: Vitals:   01/20/16 2100 01/21/16 0007 01/21/16 0650 01/21/16 0816  BP: 109/61 (!) 104/51 (!) 115/55 104/68  Pulse: 64 66 60 64  Resp:   18   Temp: 98.6 F (37 C) 98.6 F (37 C) 98.4 F (36.9 C) 98.6 F (37 C)  TempSrc: Oral Oral Oral Oral  SpO2: 100% 98% 97% 99%  Weight:   202 lb 11.2 oz (91.9 kg)   Height:        Intake/Output Summary (Last 24 hours) at 01/21/16 1017 Last data filed at 01/21/16 0840  Gross per 24 hour  Intake              240 ml  Output                0 ml  Net              240 ml    Telemetry Personally reviewed  s SR 60's, rare PVC, one 3 beat NSVT  GEN- The patient is well appearing, alert and oriented x 3 today.   Head- normocephalic, atraumatic Eyes-  Sclera clear, conjunctiva pink Ears- hearing intact Oropharynx- clear Neck- supple, no JVP Lungs- Clear to ausculation bilaterally, normal work of breathing Heart- Regular rate and rhythm, no significant murmurs, no rubs or gallops GI- soft, NT, ND Extremities- no clubbing, cyanosis, or edema Skin- no rash or lesion Psych- euthymic mood, full affect Neuro- no gross deficits appreciated  LABS: Basic Metabolic Panel:  Recent Labs  69/48/54 0406 01/21/16 0241  NA 138 137  K 3.9 4.0  CL 104 104  CO2 26 26  GLUCOSE 118* 142*  BUN 16 19    CREATININE 1.39* 1.56*  CALCIUM 9.0 9.2  MG 1.9 1.9   Liver Function Tests:  Recent Labs  01/19/16 0206  AST 17  ALT 16*  ALKPHOS 28*  BILITOT 1.0  PROT 6.0*  ALBUMIN 3.7   No results for input(s): LIPASE, AMYLASE in the last 72 hours. CBC:  Recent Labs  01/19/16 0703 01/19/16 1936  WBC 6.8 5.5  HGB 13.0 13.2  HCT 37.2* 37.6*  MCV 93.5 94.0  PLT 218 215   01/19/16: LHC 1. Severe 3 vessel obstructive CAD 2. Large patent LIMA to the LAD 3. Patent SVG to OM2. This is a tiny graft supplying a very small OM  4. Patent SVG to PDA. This is also a tiny graft supplying a very tiny PDA 5. Moderate LV dysfunction. 6. Elevated LV EDP  Plan: medical therapy. Antiarrhythmic drug therapy per EP team.   ASSESSMENT AND PLAN:   1. VT storm      Multiple ATP therapies,  3 defibrillations      Hypokalemia replaced/resolved      hypomagnesemia replaced  No HR < 55 noted, only one 3 beat NSVT noted Plan to do AV optimizationas outpt  Keep aldactone at 25mg  daily and continue mag ox LVEF 35-45% by LV gram    2. CAD      S/p cath without intervention      No anginal complaints      3. Charted hx reports PAFib     Patient/wife deny any known arrhythmia hx, never has been on a/c  4. Chronic CHF (systolic)     Appears well compensated by exam, xray, and optiVol     Monitor via his device   Francis DowseRenee Ursuy, PA-C 01/21/2016 10:17 AM   Seen and examined and plan reveiwed withwife and pt  VT stable  K stable  Will discharge ome on sotalol and adlactone and mag and hgh K diet  Fu/ bmet next week  F/u RU 3 weeks BMET also   F/u Sk 8 weeks

## 2016-01-26 ENCOUNTER — Other Ambulatory Visit: Payer: Medicare Other | Admitting: *Deleted

## 2016-01-26 ENCOUNTER — Other Ambulatory Visit: Payer: Self-pay | Admitting: *Deleted

## 2016-01-26 DIAGNOSIS — E876 Hypokalemia: Secondary | ICD-10-CM

## 2016-01-26 LAB — BASIC METABOLIC PANEL
BUN: 21 mg/dL (ref 7–25)
CALCIUM: 9.7 mg/dL (ref 8.6–10.3)
CO2: 24 mmol/L (ref 20–31)
CREATININE: 1.56 mg/dL — AB (ref 0.70–1.33)
Chloride: 102 mmol/L (ref 98–110)
Glucose, Bld: 184 mg/dL — ABNORMAL HIGH (ref 65–99)
Potassium: 4.8 mmol/L (ref 3.5–5.3)
SODIUM: 134 mmol/L — AB (ref 135–146)

## 2016-02-09 NOTE — Progress Notes (Signed)
Cardiology Office Note Date:  02/10/2016  Patient ID:  [name], DOB 1955/04/24, MRN [id number] PCP:  [name],RICHARD A, MD  Cardiologist:  Dr. SwazilandJordan Electrophysiologist: Dr. Graciela HusbandsKlein   Chief Complaint: post hospital planned f/u  History of Present Illness: [name] is a 60 y.o. male with history of  CAD s/p CABG x 3, type II DM, HLD, chronic systolic HF and ischemic cardiomyoapthy with EF 35-40% s/p Medtronic CRT-D. He also has hemochromatosis, right bundle branch block, PAF and OSA was at [facility] month discharged 01/21/16 after ICD shocks for VT.  He underwent LHC without interventions and was started on Sotalol and his aldactone increased given hypokalemia.   He comes in today to be seen for Dr. Graciela HusbandsKlein, he reports feeling very well, he feels like his HR has been stable, historically having intermittent palpitations pretty routinely, has not felt any.  He denies any kind of CP or SOB, no exertional intolerances, no dizziness, near syncope or syncope.  He feels like his energy level is a little flat in general.  Denise symptoms of PND or orthopnea.   DEVICE information: MDT CRT-D, implanted 08/21/11, primary prevention, CHF, Dr. Graciela HusbandsKlein LV chronically programmed off  + appropriate shocks for VT October 2017 AAD Sotalol Oct 2017   Past Medical History:  Diagnosis Date  . AMI anterior wall (HCC) 2006  . Anemia   . Angina    "upon exertion"  . CHF (congestive heart failure) (HCC)   . Chronic systolic heart failure (HCC)    class 2-3  . Coronary artery disease   . Exertional dyspnea   . Hemochromatosis   . Hyperlipidemia   . ICD (implantable cardiac defibrillator) in place   . ICD-Medtronic-CRT 08/24/2011  . Ischemic cardiomyopathy    EF 36%; prior anterior MI, s/p CABG x 3; s/p cath Feb 2013 showing grafts to be patent with severe LV dysfunction  . Pacemaker   . PAF (paroxysmal atrial fibrillation) (HCC)   . 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  RBBB (right bundle branch block)   . Rotator cuff injury   . Sleep apnea    no cpap  . Type II diabetes mellitus (HCC)   . Ventricular tachycardia, 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    Past Surgical History:  Procedure Laterality Date  . BI-VENTRICULAR IMPLANTABLE CARDIOVERTER DEFIBRILLATOR N/A 08/21/2011   Procedure: BI-VENTRICULAR IMPLANTABLE CARDIOVERTER DEFIBRILLATOR  (CRT-D);  Surgeon: Duke SalviaSteven C Klein, MD;  [facility];  Service: Cardiovascular;  Laterality: N/A;  . BIV ICD GENERTAOR CHANGE OUT N/A 06/12/2014   Procedure: BIV ICD GENERTAOR CHANGE OUT;  Surgeon: Duke SalviaSteven C Klein, MD;  [facility];  Service: Cardiovascular;  Laterality: N/A;  . CARDIAC CATHETERIZATION  03/12/2005   INFERIOR WALL AKINESIA. THERE IS GLOBAL HYPOKINESIA WITH OVERALL MODERATE TO SEVERE LEFT VENTRICULAR SYSTOLIC DYSFUNCTION. EF 35%  . CARDIAC CATHETERIZATION  Feb 2013   Grafts patent. EF is 20 to 25%.  Marland Kitchen. CARDIAC CATHETERIZATION N/A 01/19/2016   Procedure: Left Heart Cath and Cors/Grafts Angiography;  Surgeon: Peter M SwazilandJordan, MD;  [facility];  Service: Cardiovascular;  Laterality: N/A;  . CARDIAC DEFIBRILLATOR PLACEMENT  08/21/11   "pacer/defib"  . CORONARY ARTERY BYPASS GRAFT  2006   Emergent CABG x 3 with LIMA to LAD, SVG to OM, SVG to distal RCA per Dr. Tyrone SageGerhardt  . INGUINAL HERNIA REPAIR  02/09/2012   WITH MESH  . INGUINAL HERNIA REPAIR  02/09/2012  Procedure: HERNIA REPAIR INGUINAL ADULT;  Surgeon: Cherylynn Ridges, MD;  Location: [facility] OR;  Service: General;  Laterality: Right;  . INSERT / REPLACE / REMOVE PACEMAKER    . INSERTION OF MESH  02/09/2012   Procedure: INSERTION OF MESH;  Surgeon: Cherylynn Ridges, MD;  Location: [facility] OR;  Service: General;  Laterality: Right;  . JOINT REPLACEMENT     bil shoulders  . LEFT HEART CATHETERIZATION WITH CORONARY/GRAFT ANGIOGRAM N/A 05/25/2011   Procedure: LEFT HEART CATHETERIZATION WITH Isabel Caprice;  Surgeon: Peter M Swaziland, MD;   Location: [facility] CATH LAB;  Service: Cardiovascular;  Laterality: N/A;  . ROTATOR CUFF REPAIR  ?2000/~ 2007   left; right     Current Outpatient Prescriptions  Medication Sig Dispense Refill  . aspirin 81 MG tablet Take 81 mg by mouth daily.    . carvedilol (COREG) 25 MG tablet Take 1 tablet (25 mg total) by mouth 2 (two) times daily with a meal. 135 tablet 2  . fenofibrate 160 MG tablet TAKE 1 TABLET BY MOUTH DAILY. 90 tablet 2  . furosemide (LASIX) 20 MG tablet TAKE 1/2 TABLET BY MOUTH DAILY. 30 tablet 11  . lisinopril (PRINIVIL,ZESTRIL) 10 MG tablet Take 0.5 tablets (5 mg total) by mouth daily. 30 tablet 11  . magnesium oxide (MAG-OX) 400 (241.3 Mg) MG tablet Take 1 tablet (400 mg total) by mouth daily. 30 tablet 11  . metFORMIN (GLUCOPHAGE) 500 MG tablet Take 1,000 mg by mouth 2 (two) times daily with a meal.     . NITROSTAT 0.4 MG SL tablet PLACE 1 TABLET UNDER THE TONGUE EVERY 5 MINUTES AS NEEDED FOR CHEST PAIN FOR UP TO 3 DOSES. 25 tablet 3  . Omega-3 Fatty Acids (RA FISH OIL) 1400 MG CPDR Take 1,400 mg by mouth 2 (two) times daily.    Letta Pate VERIO test strip 1 each by Other route as needed for other.   6  . sotalol (BETAPACE) 120 MG tablet Take 1 tablet (120 mg total) by mouth 2 (two) times daily. 60 tablet 11  . spironolactone (ALDACTONE) 25 MG tablet Take 1 tablet (25 mg total) by mouth daily. 30 tablet 11   No current facility-administered medications for this visit.     Allergies:   Statins and Zetia [ezetimibe]   Social History:  The patient  reports that he quit smoking about 10 years ago. His smoking use included Cigarettes. He has a 60.00 pack-year smoking history. He has never used smokeless tobacco. He reports that he drinks about 1.8 oz of alcohol per week . He reports that he does not use drugs.   Family History:  The patient's family history is not on file. He was adopted.   ROS:  Please see the history of present illness.  All other systems are reviewed and  otherwise negative.   PHYSICAL EXAM:  VS:  BP 118/64   Pulse 69   Ht 6' (1.829 m)   Wt 210 lb (95.3 kg)   BMI 28.48 kg/m  BMI: Body mass index is 28.48 kg/m. Well nourished, well developed, in no acute distress  HEENT: normocephalic, atraumatic  Neck: no JVD, carotid bruits or masses Cardiac: no significant murmurs, no rubs, or gallops Lungs:  clear to auscultation bilaterally, no wheezing, rhonchi or rales  Abd: soft, nontender MS: no deformity or atrophy Ext: no edema  Skin: warm and dry, no rash Neuro:  No gross deficits appreciated Psych: euthymic mood, full affect  ICD site is stable,  no tethering or discomfort   EKG:  Done 01/20/16 and reviewed by myself showed SR, V paced, today is SR, V paced, QRS , QTc ICD interrogation today and reviewed by myself: stable battery and lead status, LV lead is chronically programmed off, + diaphragmatic stim today with the LV lead and remains programmed off, 99.7% RV paced, one NSVT only since his hospital stay  01/19/16: LHC Conclusion  Prox LAD to Mid LAD lesion, 85 %stenosed.  Ost Ramus to Ramus lesion, 55 %stenosed.  Prox Cx to Mid Cx lesion, 100 %stenosed.  Ost RCA to Dist RCA lesion, 100 %stenosed.  SVG graft was visualized by angiography and is small.  SVG graft was visualized by angiography and is small.  LIMA graft was visualized by angiography and is large and anatomically normal.  There is moderate left ventricular systolic dysfunction.  LV end diastolic pressure is moderately elevated.  The left ventricular ejection fraction is 35-45% by visual estimate. 1. Severe 3 vessel obstructive CAD 2. Large patent LIMA to the LAD 3. Patent SVG to OM2. This is a tiny graft supplying a very small OM  4. Patent SVG to PDA. This is also a tiny graft supplying a very tiny PDA 5. Moderate LV dysfunction. 6. Elevated LV EDP Plan: medical therapy. Antiarrhythmic drug therapy per EP team.   07/01/14: TTE Study  Conclusions - Left ventricle: The cavity size was normal. Wall thickness was normal. Systolic function was moderately reduced. The estimated ejection fraction was in the range of 35% to 40%. Diffuse hypokinesis, the apex and periapical segments appeared most abnormal. Doppler parameters are consistent with abnormal left ventricular relaxation (grade 1 diastolic dysfunction). - Aortic valve: There was no stenosis. - Mitral valve: There was trivial regurgitation. - Left atrium: The atrium was mildly dilated. - Right ventricle: Poorly visualized. The cavity size was normal. Pacer wire or catheter noted in right ventricle. Systolic function was mildly reduced. - Pulmonary arteries: No complete TR doppler jet so unable to estimate PA systolic pressure. - Inferior vena cava: The vessel was normal in size. The respirophasic diameter changes were in the normal range (= 50%), consistent with normal central venous pressure. Impressions - Technically difficult study with poor acoustic windows. Echo contrast may have helped but was not used (would use in future). Normal LV size with EF 35-40%. Diffuse hypokinesis, worse in the apex and peri-apical segments. Poorly visualized RV but probably normal size and mildly decreased systolic function.  Recent Labs: 01/19/2016: ALT 16; Hemoglobin 13.2; Platelets 215 01/21/2016: Magnesium 1.9 01/26/2016: BUN 21; Creat 1.56; Potassium 4.8; Sodium 134  No results found for requested labs within last 8760 hours.   Estimated Creatinine Clearance: 61.1 mL/min (by C-G formula based on SCr of 1.56 mg/dL (H)).   Wt Readings from Last 3 Encounters:  02/10/16 210 lb (95.3 kg)  01/21/16 202 lb 11.2 oz (91.9 kg)  01/03/16 205 lb 9.6 oz (93.3 kg)     Other studies reviewed: Additional studies/records reviewed today include: summarized above  ASSESSMENT AND PLAN:  1. VT/ICD     One NSVT only     normal device function, no changes  made     EKG stable, last BMET (last Calc. Cr. Cl is 69), K+ wnl, continue sotalol  2. ICM     Last EF 35-45% by cath/LV gram Oct 2017     exam is euvolemic, OptiVol is stable, no indication of fluid accumulation     On BB/ACE, aldactone  3. CAD  LHC last month, medical management     On ASA, BB, fenofibrate, chart reports statin and zetia allergy/intolernaces     C/w Dr. SwazilandJordan  Disposition: F/u with as planned with Dr. Graciela HusbandsKlein.  Current medicines are reviewed at length with the patient today.  The patient did not have any concerns regarding medicines.  Judith BlonderSigned, Talma Aguillard Ursy, PA-C 02/10/2016 9:02 AM     [facility] [address] [place] [address] [phone] (office)  [phone] (fax)

## 2016-02-10 ENCOUNTER — Other Ambulatory Visit: Payer: Medicare Other | Admitting: *Deleted

## 2016-02-10 ENCOUNTER — Encounter: Payer: Self-pay | Admitting: Physician Assistant

## 2016-02-10 ENCOUNTER — Ambulatory Visit (INDEPENDENT_AMBULATORY_CARE_PROVIDER_SITE_OTHER): Payer: Medicare Other | Admitting: Physician Assistant

## 2016-02-10 VITALS — BP 118/64 | HR 69 | Ht 72.0 in | Wt 210.0 lb

## 2016-02-10 DIAGNOSIS — I2510 Atherosclerotic heart disease of native coronary artery without angina pectoris: Secondary | ICD-10-CM

## 2016-02-10 DIAGNOSIS — I472 Ventricular tachycardia, unspecified: Secondary | ICD-10-CM

## 2016-02-10 DIAGNOSIS — I255 Ischemic cardiomyopathy: Secondary | ICD-10-CM

## 2016-02-10 DIAGNOSIS — E7800 Pure hypercholesterolemia, unspecified: Secondary | ICD-10-CM

## 2016-02-10 DIAGNOSIS — E1121 Type 2 diabetes mellitus with diabetic nephropathy: Secondary | ICD-10-CM

## 2016-02-10 LAB — BASIC METABOLIC PANEL
BUN: 25 mg/dL (ref 7–25)
CO2: 22 mmol/L (ref 20–31)
Calcium: 9.4 mg/dL (ref 8.6–10.3)
Chloride: 102 mmol/L (ref 98–110)
Creat: 1.49 mg/dL — ABNORMAL HIGH (ref 0.70–1.33)
GLUCOSE: 167 mg/dL — AB (ref 65–99)
POTASSIUM: 4.8 mmol/L (ref 3.5–5.3)
SODIUM: 137 mmol/L (ref 135–146)

## 2016-02-10 NOTE — Patient Instructions (Signed)
Medication Instructions:   Your physician recommends that you continue on your current medications as directed. Please refer to the Current Medication list given to you today.    If you need a refill on your cardiac medications before your next appointment, please call your pharmacy.  Labwork: NONE ORDERED  TODAY    Testing/Procedures: NONE ORDERED  TODAY    Follow-Up:  AS SCHEDULED WITH DR Graciela HusbandsKLEIN IN DECEMBER   Any Other Special Instructions Will Be Listed Below (If Applicable).

## 2016-02-10 NOTE — Progress Notes (Signed)
Bmp

## 2016-02-15 ENCOUNTER — Telehealth: Payer: Self-pay | Admitting: Physician Assistant

## 2016-02-15 ENCOUNTER — Telehealth: Payer: Self-pay | Admitting: *Deleted

## 2016-02-15 NOTE — Telephone Encounter (Signed)
-----   Message from Renee Lynn Ursuy, PA-C sent at 02/10/2016  4:48 PM EDT ----- Please let the patient know his lab is stable, no changes.    Thanks renee 

## 2016-02-15 NOTE — Telephone Encounter (Signed)
New message ° ° ° ° °Returning a call to the nurse to get lab results °

## 2016-02-15 NOTE — Telephone Encounter (Signed)
LMOVM TO CALL BACK FOR RESULTS 

## 2016-02-15 NOTE — Telephone Encounter (Signed)
-----   Message from [facility] CenterRenee Lynn Ursuy, [place]PA-C sent at 02/10/2016  4:48 PM EDT ----- Please let the patient know his lab is stable, no changes.    Thanks renee

## 2016-02-15 NOTE — Telephone Encounter (Signed)
SPOKE TO PT ABOUT RESULTS AND VERBALIZED UNDERSTANDING  

## 2016-03-01 ENCOUNTER — Encounter: Payer: Self-pay | Admitting: *Deleted

## 2016-03-10 ENCOUNTER — Encounter: Payer: Self-pay | Admitting: Internal Medicine

## 2016-03-10 ENCOUNTER — Encounter (INDEPENDENT_AMBULATORY_CARE_PROVIDER_SITE_OTHER): Payer: Medicare Other | Admitting: Internal Medicine

## 2016-03-10 ENCOUNTER — Encounter (INDEPENDENT_AMBULATORY_CARE_PROVIDER_SITE_OTHER): Payer: Self-pay

## 2016-03-10 ENCOUNTER — Ambulatory Visit: Payer: Medicare Other | Admitting: Internal Medicine

## 2016-03-10 ENCOUNTER — Ambulatory Visit (INDEPENDENT_AMBULATORY_CARE_PROVIDER_SITE_OTHER): Payer: Medicare Other | Admitting: Internal Medicine

## 2016-03-10 VITALS — BP 128/68 | HR 62 | Ht 72.0 in | Wt 209.0 lb

## 2016-03-10 DIAGNOSIS — I255 Ischemic cardiomyopathy: Secondary | ICD-10-CM

## 2016-03-10 LAB — CUP PACEART INCLINIC DEVICE CHECK
Battery Remaining Longevity: 91 mo
Battery Voltage: 3 V
Brady Statistic RV Percent Paced: 99.47 %
Date Time Interrogation Session: 20171201094020
HIGH POWER IMPEDANCE MEASURED VALUE: 64 Ohm
Implantable Lead Location: 753858
Implantable Lead Serial Number: 316784
Implantable Pulse Generator Implant Date: 20160304
Lead Channel Impedance Value: 494 Ohm
Lead Channel Pacing Threshold Amplitude: 0.75 V
Lead Channel Sensing Intrinsic Amplitude: 1.75 mV
Lead Channel Sensing Intrinsic Amplitude: 12.125 mV
Lead Channel Setting Pacing Amplitude: 2 V
Lead Channel Setting Pacing Amplitude: 2.5 V
Lead Channel Setting Sensing Sensitivity: 0.45 mV
MDC IDC LEAD IMPLANT DT: 20130513
MDC IDC LEAD IMPLANT DT: 20130513
MDC IDC LEAD IMPLANT DT: 20130513
MDC IDC LEAD LOCATION: 753859
MDC IDC LEAD LOCATION: 753860
MDC IDC LEAD MODEL: 181
MDC IDC LEAD MODEL: 4396
MDC IDC MSMT LEADCHNL LV IMPEDANCE VALUE: 494 Ohm
MDC IDC MSMT LEADCHNL LV IMPEDANCE VALUE: 589 Ohm
MDC IDC MSMT LEADCHNL LV IMPEDANCE VALUE: 912 Ohm
MDC IDC MSMT LEADCHNL RA IMPEDANCE VALUE: 494 Ohm
MDC IDC MSMT LEADCHNL RA PACING THRESHOLD AMPLITUDE: 0.75 V
MDC IDC MSMT LEADCHNL RA PACING THRESHOLD PULSEWIDTH: 0.4 ms
MDC IDC MSMT LEADCHNL RA SENSING INTR AMPL: 0.75 mV
MDC IDC MSMT LEADCHNL RV IMPEDANCE VALUE: 513 Ohm
MDC IDC MSMT LEADCHNL RV PACING THRESHOLD PULSEWIDTH: 0.4 ms
MDC IDC MSMT LEADCHNL RV SENSING INTR AMPL: 16 mV
MDC IDC SET LEADCHNL RV PACING PULSEWIDTH: 0.4 ms
MDC IDC STAT BRADY AP VP PERCENT: 0.09 %
MDC IDC STAT BRADY AP VS PERCENT: 0 %
MDC IDC STAT BRADY AS VP PERCENT: 99.78 %
MDC IDC STAT BRADY AS VS PERCENT: 0.13 %
MDC IDC STAT BRADY RA PERCENT PACED: 0.1 %

## 2016-03-10 NOTE — Progress Notes (Signed)
.      Patient Care Team: Geoffry Paradiseichard Aronson, MD as PCP - General (Internal Medicine)   HPI  [name] is a 60 y.o. male Seen in followup for an CRT-D implanted for primary prevention April 2013 for ischemic cardiomyopathy and prior myocardial infarction and right bundle branch block.  He underwent device generator reimplantation 2/16  He was admitted 10/17 w VT storm-- noted to be mildly hypokalemic;  Aldactone and Mag started.  Nitrates added for possible ischemic trigger m Sotalol added    He is modestly improved following CRT. He is less winded. He still develops fatigue.  He has daytime somnolence and obstructive sleep apnea. He's had a sleep study but is not inclined to CPAP therapy.  There is been no significant change in functional status following the off programming of his LV lead   DATE TEST     3/13    echo   EF 40-45 %   3/16    echo   EF 30-35 %   10/17 cath EF 35-40% LIMA/SVG OM2-PDA -- patent , severe native disease    Date Cr Mg K  11/17/   1.49  4.8       K 5.0 3/16 and CR also normal  DEVICE information: MDT CRT-D, implanted 08/21/11, primary prevention, CHF, Dr. Graciela HusbandsKlein LV chronically programmed off  + appropriate shocks for VT October 2017 AAD Sotalol Oct 2017   Past Medical History:  Diagnosis Date  . AMI anterior wall (HCC) 2006  . Anemia   . Angina    "upon exertion"  . CHF (congestive heart failure) (HCC)   . Chronic systolic heart failure (HCC)    class 2-3  . Coronary artery disease   . Exertional dyspnea   . Hemochromatosis   . Hyperlipidemia   . ICD (implantable cardiac defibrillator) in place   . ICD-Medtronic-CRT 08/24/2011  . Ischemic cardiomyopathy    EF 36%; prior anterior MI, s/p CABG x 3; s/p cath Feb 2013 showing grafts to be patent with severe LV dysfunction  . Pacemaker   . PAF (paroxysmal atrial fibrillation) (HCC)   . 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 RBBB (right bundle branch block)   .  Rotator cuff injury   . Sleep apnea    no cpap  . Type II diabetes mellitus (HCC)   . Ventricular tachycardia, 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    Past Surgical History:  Procedure Laterality Date  . BI-VENTRICULAR IMPLANTABLE CARDIOVERTER DEFIBRILLATOR N/A 08/21/2011   Procedure: BI-VENTRICULAR IMPLANTABLE CARDIOVERTER DEFIBRILLATOR  (CRT-D);  Surgeon: Duke SalviaSteven C Klein, MD;  [facility];  Service: Cardiovascular;  Laterality: N/A;  . BIV ICD GENERTAOR CHANGE OUT N/A 06/12/2014   Procedure: BIV ICD GENERTAOR CHANGE OUT;  Surgeon: Duke SalviaSteven C Klein, MD;  [facility];  Service: Cardiovascular;  Laterality: N/A;  . CARDIAC CATHETERIZATION  03/12/2005   INFERIOR WALL AKINESIA. THERE IS GLOBAL HYPOKINESIA WITH OVERALL MODERATE TO SEVERE LEFT VENTRICULAR SYSTOLIC DYSFUNCTION. EF 35%  . CARDIAC CATHETERIZATION  Feb 2013   Grafts patent. EF is 20 to 25%.  Marland Kitchen. CARDIAC CATHETERIZATION N/A 01/19/2016   Procedure: Left Heart Cath and Cors/Grafts Angiography;  Surgeon: Peter M SwazilandJordan, MD;  [facility];  Service: Cardiovascular;  Laterality: N/A;  . CARDIAC DEFIBRILLATOR PLACEMENT  08/21/11   "pacer/defib"  . CORONARY ARTERY BYPASS GRAFT  2006   Emergent CABG x 3 with LIMA to LAD, SVG to OM, SVG to  distal RCA per Dr. Tyrone SageGerhardt  . INGUINAL HERNIA REPAIR  02/09/2012   WITH MESH  . INGUINAL HERNIA REPAIR  02/09/2012   Procedure: HERNIA REPAIR INGUINAL ADULT;  Surgeon: Cherylynn RidgesJames O Wyatt, MD;  [facility];  Service: General;  Laterality: Right;  . INSERT / REPLACE / REMOVE PACEMAKER    . INSERTION OF MESH  02/09/2012   Procedure: INSERTION OF MESH;  Surgeon: Cherylynn RidgesJames O Wyatt, MD;  [facility];  Service: General;  Laterality: Right;  . JOINT REPLACEMENT     bil shoulders  . LEFT HEART CATHETERIZATION WITH CORONARY/GRAFT ANGIOGRAM N/A 05/25/2011   Procedure: LEFT HEART CATHETERIZATION WITH Isabel CapriceORONARY/GRAFT ANGIOGRAM;  Surgeon: Peter M SwazilandJordan, MD;  [facility];  Service:  Cardiovascular;  Laterality: N/A;  . ROTATOR CUFF REPAIR  ?2000/~ 2007   left; right     Current Outpatient Prescriptions  Medication Sig Dispense Refill  . aspirin 81 MG tablet Take 81 mg by mouth daily.    . carvedilol (COREG) 25 MG tablet Take 1 tablet (25 mg total) by mouth 2 (two) times daily with a meal. 135 tablet 2  . fenofibrate 160 MG tablet TAKE 1 TABLET BY MOUTH DAILY. 90 tablet 2  . furosemide (LASIX) 20 MG tablet TAKE 1/2 TABLET BY MOUTH DAILY. 30 tablet 11  . lisinopril (PRINIVIL,ZESTRIL) 10 MG tablet Take 0.5 tablets (5 mg total) by mouth daily. 30 tablet 11  . magnesium oxide (MAG-OX) 400 (241.3 Mg) MG tablet Take 1 tablet (400 mg total) by mouth daily. 30 tablet 11  . metFORMIN (GLUCOPHAGE) 500 MG tablet Take 1,000 mg by mouth 2 (two) times daily with a meal.     . NITROSTAT 0.4 MG SL tablet PLACE 1 TABLET UNDER THE TONGUE EVERY 5 MINUTES AS NEEDED FOR CHEST PAIN FOR UP TO 3 DOSES. 25 tablet 3  . Omega-3 Fatty Acids (RA FISH OIL) 1400 MG CPDR Take 1,400 mg by mouth 2 (two) times daily.    Letta Pate. ONETOUCH VERIO test strip 1 each by Other route as needed for other.   6  . sotalol (BETAPACE) 120 MG tablet Take 1 tablet (120 mg total) by mouth 2 (two) times daily. 60 tablet 11  . spironolactone (ALDACTONE) 25 MG tablet Take 1 tablet (25 mg total) by mouth daily. 30 tablet 11   No current facility-administered medications for this visit.     Allergies  Allergen Reactions  . Statins Other (See Comments)    Myalgias   . Zetia [Ezetimibe] Other (See Comments)    myalgias    Review of Systems negative except from HPI and PMH  Physical Exam There were no vitals taken for this visit. Well developed and well nourished in no acute distress HENT normal E scleral and icterus clear Neck Supple JVP flat; carotids brisk and full Clear to ausculation  Device pocket well healed; without hematoma or erythema.  There is no tethering Regular rate and rhythm, no murmurs gallops or  rub Soft with active bowel sounds No clubbing cyanosis none Edema Alert and oriented, grossly normal motor and sensory function Skin Warm and Dry  ECG demonstrates P. synchronous pacing with a narrow QRS 100 ms  Assessment and  Plan  Ischemic cardiomyopathy  VT storm   CRT-Medtronic  The patient's device was interrogated and the information was fully reviewed.  The device was reprogrammed to  Maximize longevity  RBBB  Congestive heart failure-chronic systolic  High output LV capture threshold  OFF

## 2016-03-10 NOTE — Patient Instructions (Addendum)
Medication Instructions: - Your physician recommends that you continue on your current medications as directed. Please refer to the Current Medication list given to you today.  Labwork: - none ordered  Procedures/Testing: - Your physician has recommended that you have an AV optimization echo (Medtronic)- schedule on a day Dr. Graciela HusbandsKlein is in the office. During this procedure, an echocardiogram is performed to optimize the timing of your device using ultrasound and a device programmer. Changes will be made to the device settings to help the heart chambers pump more efficiently. This procedure takes approximately one hour.  Follow-Up: - Your physician recommends that you schedule a follow-up appointment in: 3 months with Gypsy BalsamAmber Seiler, NP for Dr. Graciela HusbandsKlein.  Any Additional Special Instructions Will Be Listed Below (If Applicable).     If you need a refill on your cardiac medications before your next appointment, please call your pharmacy.

## 2016-03-10 NOTE — Progress Notes (Addendum)
Progress Notes Unsigned  Encounter Date: 03/10/2016 8:00 AM Duke SalviaSteven C Lakresha Stifter, MD  Electrophysiology    [] Hide copied text .      Patient Care Team: Geoffry Paradiseichard Aronson, MD as PCP - General (Internal Medicine)   HPI  [name] is a 60 y.o. male Seen in followup for an CRT-D implanted for primary prevention April 2013 for ischemic cardiomyopathy and prior myocardial infarction and right bundle branch block.  He underwent device generator reimplantation 2/16  He was admitted 10/17 w VT storm-- noted to be mildly hypokalemic;  Aldactone and Mag started.  Nitrates added for possible ischemic trigger m Sotalol added    He is modestly improved following CRT. He is less winded. He still develops fatigue.  He has daytime somnolence and obstructive sleep apnea. He's had a sleep study but is not inclined to CPAP therapy.  There is been no significant change in functional status following the off programming of his LV lead   DATE TEST     3/13    echo   EF 40-45 %   3/16    echo   EF 30-35 %   10/17 cath EF 35-40% LIMA/SVG OM2-PDA -- patent , severe native disease    Date Cr Mg K  10.13     1.9      11/17/   1.49   4.8          DEVICE information: MDT CRT-D, implanted 08/21/11, primary prevention, CHF, Dr. Graciela HusbandsKlein LV chronically programmed off  + appropriate shocks for VT October 2017 AAD Sotalol Oct 2017       Past Medical History:  Diagnosis Date  . AMI anterior wall (HCC) 2006  . Anemia   . Angina    "upon exertion"  . CHF (congestive heart failure) (HCC)   . Chronic systolic heart failure (HCC)    class 2-3  . Coronary artery disease   . Exertional dyspnea   . Hemochromatosis   . Hyperlipidemia   . ICD (implantable cardiac defibrillator) in place   . ICD-Medtronic-CRT 08/24/2011  . Ischemic cardiomyopathy    EF 36%; prior anterior MI, s/p CABG x 3; s/p cath Feb 2013 showing grafts to be patent with severe LV dysfunction  .  Pacemaker   . PAF (paroxysmal atrial fibrillation) (HCC)   . 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 RBBB (right bundle branch block)   . Rotator cuff injury   . Sleep apnea    no cpap  . Type II diabetes mellitus (HCC)   . Ventricular tachycardia, 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         Past Surgical History:  Procedure Laterality Date  . BI-VENTRICULAR IMPLANTABLE CARDIOVERTER DEFIBRILLATOR N/A 08/21/2011   Procedure: BI-VENTRICULAR IMPLANTABLE CARDIOVERTER DEFIBRILLATOR  (CRT-D);  Surgeon: Duke SalviaSteven C Patryce Depriest, MD;  [facility];  Service: Cardiovascular;  Laterality: N/A;  . BIV ICD GENERTAOR CHANGE OUT N/A 06/12/2014   Procedure: BIV ICD GENERTAOR CHANGE OUT;  Surgeon: Duke SalviaSteven C Vincen Bejar, MD;  [facility];  Service: Cardiovascular;  Laterality: N/A;  . CARDIAC CATHETERIZATION  03/12/2005   INFERIOR WALL AKINESIA. THERE IS GLOBAL HYPOKINESIA WITH OVERALL MODERATE TO SEVERE LEFT VENTRICULAR SYSTOLIC DYSFUNCTION. EF 35%  . CARDIAC CATHETERIZATION  Feb 2013   Grafts patent. EF is 20 to 25%.  Marland Kitchen. CARDIAC CATHETERIZATION N/A 01/19/2016   Procedure: Left Heart Cath and Cors/Grafts Angiography;  Surgeon: Peter M SwazilandJordan, MD;  [facility];  Service:  Cardiovascular;  Laterality: N/A;  . CARDIAC DEFIBRILLATOR PLACEMENT  08/21/11   "pacer/defib"  . CORONARY ARTERY BYPASS GRAFT  2006   Emergent CABG x 3 with LIMA to LAD, SVG to OM, SVG to distal RCA per Dr. Tyrone Sage  . INGUINAL HERNIA REPAIR  02/09/2012   WITH MESH  . INGUINAL HERNIA REPAIR  02/09/2012   Procedure: HERNIA REPAIR INGUINAL ADULT;  Surgeon: Cherylynn Ridges, MD;  Location: [facility] OR;  Service: General;  Laterality: Right;  . INSERT / REPLACE / REMOVE PACEMAKER    . INSERTION OF MESH  02/09/2012   Procedure: INSERTION OF MESH;  Surgeon: Cherylynn Ridges, MD;  [facility];  Service: General;  Laterality: Right;  . JOINT REPLACEMENT     bil shoulders  . LEFT HEART  CATHETERIZATION WITH CORONARY/GRAFT ANGIOGRAM N/A 05/25/2011   Procedure: LEFT HEART CATHETERIZATION WITH Isabel Caprice;  Surgeon: Peter M Swaziland, MD;  Location: [facility] C[facility] LAB;  Service: Cardiovascular;  Laterality: N/A;  . ROTATOR CUFF REPAIR  ?2000/~ 2007   left; right           Current Outpatient Prescriptions  Medication Sig Dispense Refill  . aspirin 81 MG tablet Take 81 mg by mouth daily.    . carvedilol (COREG) 25 MG tablet Take 1 tablet (25 mg total) by mouth 2 (two) times daily with a meal. 135 tablet 2  . fenofibrate 160 MG tablet TAKE 1 TABLET BY MOUTH DAILY. 90 tablet 2  . furosemide (LASIX) 20 MG tablet TAKE 1/2 TABLET BY MOUTH DAILY. 30 tablet 11  . lisinopril (PRINIVIL,ZESTRIL) 10 MG tablet Take 0.5 tablets (5 mg total) by mouth daily. 30 tablet 11  . magnesium oxide (MAG-OX) 400 (241.3 Mg) MG tablet Take 1 tablet (400 mg total) by mouth daily. 30 tablet 11  . metFORMIN (GLUCOPHAGE) 500 MG tablet Take 1,000 mg by mouth 2 (two) times daily with a meal.     . NITROSTAT 0.4 MG SL tablet PLACE 1 TABLET UNDER THE TONGUE EVERY 5 MINUTES AS NEEDED FOR CHEST PAIN FOR UP TO 3 DOSES. 25 tablet 3  . Omega-3 Fatty Acids (RA FISH OIL) 1400 MG CPDR Take 1,400 mg by mouth 2 (two) times daily.    Letta Pate VERIO test strip 1 each by Other route as needed for other.   6  . sotalol (BETAPACE) 120 MG tablet Take 1 tablet (120 mg total) by mouth 2 (two) times daily. 60 tablet 11  . spironolactone (ALDACTONE) 25 MG tablet Take 1 tablet (25 mg total) by mouth daily. 30 tablet 11   No current facility-administered medications for this visit.          Allergies  Allergen Reactions  . Statins Other (See Comments)    Myalgias  . Zetia [Ezetimibe] Other (See Comments)    myalgias    Review of Systems negative except from HPI and PMH  Physical Exam 128/68  p 62 Well developed and well nourished in no acute distress HENT normal E scleral and icterus  clear Neck Supple JVP flat; carotids brisk and full Clear to ausculation  Device pocket well healed; without hematoma or erythema.  There is no tethering Regular rate and rhythm, no murmurs gallops or rub Soft with active bowel sounds No clubbing cyanosis none Edema Alert and oriented, grossly normal motor and sensory function Skin Warm and Dry  ECG demonstrates P. synchronous pacing with a narrow QRS 100 ms  Assessment and  Plan  Ischemic cardiomyopathy  VT  storm   CRT-Medtronic  The patient's device was interrogated and the information was fully reviewed.  The device was reprogrammed to  Maximize longevity  RBBB  Congestive heart failure-chronic systolic  High output LV capture threshold  OFF   Will do AV optimization to see if we can accompllish some degree of CRt Tolerating sotalol With nointercurrent VT  Without symptoms of ischemia  Euvolemic continue current meds                Office Visit on 03/10/2016        Detailed Report

## 2016-04-04 ENCOUNTER — Other Ambulatory Visit (HOSPITAL_COMMUNITY): Payer: Medicare Other

## 2016-04-13 ENCOUNTER — Ambulatory Visit (INDEPENDENT_AMBULATORY_CARE_PROVIDER_SITE_OTHER): Payer: Medicare Other | Admitting: Nurse Practitioner

## 2016-04-13 ENCOUNTER — Ambulatory Visit (HOSPITAL_COMMUNITY): Payer: Medicare Other | Attending: Cardiovascular Disease

## 2016-04-13 ENCOUNTER — Other Ambulatory Visit: Payer: Self-pay

## 2016-04-13 DIAGNOSIS — I509 Heart failure, unspecified: Secondary | ICD-10-CM | POA: Diagnosis not present

## 2016-04-13 DIAGNOSIS — Z87891 Personal history of nicotine dependence: Secondary | ICD-10-CM | POA: Insufficient documentation

## 2016-04-13 DIAGNOSIS — E785 Hyperlipidemia, unspecified: Secondary | ICD-10-CM | POA: Diagnosis not present

## 2016-04-13 DIAGNOSIS — I2510 Atherosclerotic heart disease of native coronary artery without angina pectoris: Secondary | ICD-10-CM | POA: Diagnosis not present

## 2016-04-13 DIAGNOSIS — I5022 Chronic systolic (congestive) heart failure: Secondary | ICD-10-CM

## 2016-04-13 DIAGNOSIS — I4510 Unspecified right bundle-branch block: Secondary | ICD-10-CM | POA: Insufficient documentation

## 2016-04-13 DIAGNOSIS — E119 Type 2 diabetes mellitus without complications: Secondary | ICD-10-CM | POA: Insufficient documentation

## 2016-04-13 DIAGNOSIS — I4891 Unspecified atrial fibrillation: Secondary | ICD-10-CM | POA: Insufficient documentation

## 2016-04-13 DIAGNOSIS — I255 Ischemic cardiomyopathy: Secondary | ICD-10-CM | POA: Insufficient documentation

## 2016-04-14 NOTE — Progress Notes (Signed)
    AV Optimization Echo Note Date: 04/14/2016  ID:  [name], DOB [birth year], MRN [id number]  PCP: [name],RICHARD A, MD Primary Cardiologist: [place]Jordan Electrophysiologist: Haynes HoehnKlein  [name] is a 61 y.o. male is referred today by Dr Graciela HusbandsKlein for AV opt echo.  He presents today for AV optimization of RV pacing. His LV lead has been turned off chronically for high thresholds. He reports stable exercise intolerance and few heart failure symptoms.    Device was reprogrammed today to a SAV of 80msec and a PAV of 110msec.    The patient will return to see me in clinic in 4 weeks for follow-up. Will do EKG optimization at that point.   Signed, Gypsy BalsamAmber Patrisha Hausmann, NP  04/14/2016 8:18 AM     [facility] [address] [place] [address] [phone] (office) [phone] (fax

## 2016-05-08 ENCOUNTER — Inpatient Hospital Stay (HOSPITAL_COMMUNITY)
Admission: EM | Admit: 2016-05-08 | Discharge: 2016-05-12 | DRG: 308 | Disposition: A | Discharge status: Home / Self Care | Payer: Medicare Other | Attending: Cardiology | Admitting: Cardiology

## 2016-05-08 ENCOUNTER — Emergency Department (HOSPITAL_COMMUNITY): Payer: Medicare Other

## 2016-05-08 ENCOUNTER — Encounter (HOSPITAL_COMMUNITY): Payer: Self-pay | Admitting: Emergency Medicine

## 2016-05-08 DIAGNOSIS — Z79899 Other long term (current) drug therapy: Secondary | ICD-10-CM | POA: Diagnosis not present

## 2016-05-08 DIAGNOSIS — G4733 Obstructive sleep apnea (adult) (pediatric): Secondary | ICD-10-CM | POA: Diagnosis present

## 2016-05-08 DIAGNOSIS — I472 Ventricular tachycardia, unspecified: Secondary | ICD-10-CM

## 2016-05-08 DIAGNOSIS — I252 Old myocardial infarction: Secondary | ICD-10-CM

## 2016-05-08 DIAGNOSIS — Z6828 Body mass index (BMI) 28.0-28.9, adult: Secondary | ICD-10-CM

## 2016-05-08 DIAGNOSIS — Z951 Presence of aortocoronary bypass graft: Secondary | ICD-10-CM | POA: Diagnosis not present

## 2016-05-08 DIAGNOSIS — I255 Ischemic cardiomyopathy: Secondary | ICD-10-CM | POA: Diagnosis present

## 2016-05-08 DIAGNOSIS — I2510 Atherosclerotic heart disease of native coronary artery without angina pectoris: Secondary | ICD-10-CM | POA: Diagnosis present

## 2016-05-08 DIAGNOSIS — Z87891 Personal history of nicotine dependence: Secondary | ICD-10-CM | POA: Diagnosis not present

## 2016-05-08 DIAGNOSIS — I5023 Acute on chronic systolic (congestive) heart failure: Secondary | ICD-10-CM | POA: Diagnosis not present

## 2016-05-08 DIAGNOSIS — N183 Chronic kidney disease, stage 3 (moderate): Secondary | ICD-10-CM | POA: Diagnosis present

## 2016-05-08 DIAGNOSIS — I480 Paroxysmal atrial fibrillation: Secondary | ICD-10-CM | POA: Diagnosis present

## 2016-05-08 DIAGNOSIS — I5022 Chronic systolic (congestive) heart failure: Secondary | ICD-10-CM | POA: Diagnosis not present

## 2016-05-08 DIAGNOSIS — Z7982 Long term (current) use of aspirin: Secondary | ICD-10-CM | POA: Diagnosis not present

## 2016-05-08 DIAGNOSIS — E1122 Type 2 diabetes mellitus with diabetic chronic kidney disease: Secondary | ICD-10-CM | POA: Diagnosis present

## 2016-05-08 DIAGNOSIS — Z7984 Long term (current) use of oral hypoglycemic drugs: Secondary | ICD-10-CM | POA: Diagnosis not present

## 2016-05-08 DIAGNOSIS — I25810 Atherosclerosis of coronary artery bypass graft(s) without angina pectoris: Secondary | ICD-10-CM | POA: Diagnosis not present

## 2016-05-08 DIAGNOSIS — I5043 Acute on chronic combined systolic (congestive) and diastolic (congestive) heart failure: Secondary | ICD-10-CM | POA: Diagnosis present

## 2016-05-08 DIAGNOSIS — Z95810 Presence of automatic (implantable) cardiac defibrillator: Secondary | ICD-10-CM

## 2016-05-08 DIAGNOSIS — D649 Anemia, unspecified: Secondary | ICD-10-CM | POA: Diagnosis present

## 2016-05-08 DIAGNOSIS — E785 Hyperlipidemia, unspecified: Secondary | ICD-10-CM | POA: Diagnosis present

## 2016-05-08 DIAGNOSIS — E7801 Familial hypercholesterolemia: Secondary | ICD-10-CM | POA: Diagnosis not present

## 2016-05-08 DIAGNOSIS — I519 Heart disease, unspecified: Secondary | ICD-10-CM | POA: Diagnosis not present

## 2016-05-08 HISTORY — DX: Presence of automatic (implantable) cardiac defibrillator: Z95.810

## 2016-05-08 HISTORY — DX: Unspecified osteoarthritis, unspecified site: M19.90

## 2016-05-08 HISTORY — DX: Acute myocardial infarction, unspecified: I21.9

## 2016-05-08 LAB — CBC WITH DIFFERENTIAL/PLATELET
BASOS ABS: 0 10*3/uL (ref 0.0–0.1)
Basophils Relative: 0 %
Eosinophils Absolute: 0.1 10*3/uL (ref 0.0–0.7)
Eosinophils Relative: 1 %
HEMATOCRIT: 37 % — AB (ref 39.0–52.0)
Hemoglobin: 12.9 g/dL — ABNORMAL LOW (ref 13.0–17.0)
Lymphocytes Relative: 20 %
Lymphs Abs: 1.8 10*3/uL (ref 0.7–4.0)
MCH: 32.4 pg (ref 26.0–34.0)
MCHC: 34.9 g/dL (ref 30.0–36.0)
MCV: 93 fL (ref 78.0–100.0)
MONO ABS: 0.7 10*3/uL (ref 0.1–1.0)
MONOS PCT: 7 %
NEUTROS PCT: 72 %
Neutro Abs: 6.4 10*3/uL (ref 1.7–7.7)
Platelets: 207 10*3/uL (ref 150–400)
RBC: 3.98 MIL/uL — ABNORMAL LOW (ref 4.22–5.81)
RDW: 11.9 % (ref 11.5–15.5)
WBC: 9 10*3/uL (ref 4.0–10.5)

## 2016-05-08 LAB — COMPREHENSIVE METABOLIC PANEL
ALK PHOS: 34 U/L — AB (ref 38–126)
ALT: 23 U/L (ref 17–63)
AST: 25 U/L (ref 15–41)
Albumin: 3.8 g/dL (ref 3.5–5.0)
Anion gap: 14 (ref 5–15)
BUN: 24 mg/dL — AB (ref 6–20)
CO2: 20 mmol/L — ABNORMAL LOW (ref 22–32)
CREATININE: 1.47 mg/dL — AB (ref 0.61–1.24)
Calcium: 9.5 mg/dL (ref 8.9–10.3)
Chloride: 101 mmol/L (ref 101–111)
GFR, EST AFRICAN AMERICAN: 58 mL/min — AB (ref >60)
GFR, EST NON AFRICAN AMERICAN: 50 mL/min — AB (ref >60)
Glucose, Bld: 230 mg/dL — ABNORMAL HIGH (ref 65–99)
Potassium: 3.7 mmol/L (ref 3.5–5.1)
Sodium: 135 mmol/L (ref 135–145)
TOTAL PROTEIN: 6.3 g/dL — AB (ref 6.5–8.1)
Total Bilirubin: 0.5 mg/dL (ref 0.3–1.2)

## 2016-05-08 LAB — MAGNESIUM: Magnesium: 1.7 mg/dL (ref 1.7–2.4)

## 2016-05-08 LAB — TROPONIN I: Troponin I: <0.03 ng/mL (ref <0.03)

## 2016-05-08 LAB — ETHANOL: Alcohol, Ethyl (B): 21 mg/dL — ABNORMAL HIGH (ref <5)

## 2016-05-08 MED ORDER — AMIODARONE HCL IN DEXTROSE 360-4.14 MG/200ML-% IV SOLN
30.0000 mg/h | INTRAVENOUS | Status: DC
  Administered 2016-05-09: 09:00:00 30 mg/h via INTRAVENOUS
  Filled 2016-05-08 (×3): qty 200

## 2016-05-08 MED ORDER — AMIODARONE HCL IN DEXTROSE 360-4.14 MG/200ML-% IV SOLN
60.0000 mg/h | INTRAVENOUS | Status: DC
  Administered 2016-05-08: 60 mg/h via INTRAVENOUS
  Filled 2016-05-08 (×3): qty 200

## 2016-05-08 MED ORDER — AMIODARONE LOAD VIA INFUSION
150.0000 mg | Freq: Once | INTRAVENOUS | Status: AC
  Administered 2016-05-08: 150 mg via INTRAVENOUS
  Filled 2016-05-08: qty 83.34

## 2016-05-08 MED ORDER — MAGNESIUM SULFATE 2 GM/50ML IV SOLN
2.0000 g | Freq: Once | INTRAVENOUS | Status: AC
  Administered 2016-05-08: 2 g via INTRAVENOUS
  Filled 2016-05-08: qty 50

## 2016-05-08 MED ORDER — POTASSIUM CHLORIDE CRYS ER 20 MEQ PO TBCR
40.0000 meq | EXTENDED_RELEASE_TABLET | Freq: Once | ORAL | Status: AC
  Administered 2016-05-09: 40 meq via ORAL
  Filled 2016-05-08: qty 2

## 2016-05-08 NOTE — H&P (Signed)
Physician History and Physical    [name] MRN: [id number] DOB/AGE: 1955/06/03 61 y.o. Admit date: 05/08/2016  Primary Care Physician: Jacky Kindle Primary Cardiologist: [place] EP: Graciela Husbands  HPI: 32 yo with history of CAD s/p CABG, ischemic cardiomyopathy/chronic systolic CHF, Medtronic CRT-D, hemochromatosis, h/o VT on sotalol presented tonight after having appropriate ICD discharge for VT.   Patient has history of CABG with ischemic cardiomyopathy, echo earlier this month with EF 40-45%.  He has Medtronic CRT-D device but the LV lead has been off due to high thresholds.  He was admitted in 10/17 with VT storm.  He was initially on amiodarone then started on sotalol.  He has been on sotalol since that time.  He had cardiac cath in 10/17 with patent grafts.   Patient had been stable since 10/17 until tonight.  He has chronic dyspnea with moderate exertion like walking to the mailbox.  This has not changed.  No orthopnea/PND.  No chest pain prior to event.  Tonight, he was at the [facility] playing cards.  He had a couple of drinks.  He was standing up waiting to pay his bill when he felt lightheaded nauseated.  He vomited and then lost consciousness, falling to the ground.  His defibrillator fired.  He had CPR from bystanders for a period of time.  EMS arrived, he was taken to the ER.  He was awake/alert when EMS arrived.  Device interrogation showed several NSVT runs then a run of VT terminated by ICD discharge.  In the ER, he was noted to have several short NSVT runs.  Amiodarone was started (last dose of sotalol was this morning).    Currently, he feels ok.  The lower right side of his chest is sore, thinks that is from CPR.  Initial troponin negative.   Review of systems complete and found to be negative unless listed above   PMH: 1. CAD: Prior anterior MI with CABG x 3.  - LHC (10/17): Patent LIMA-LAD, patent SVG-OM2 (small graft to small vessel), patent SVG-PDA (small graft to small  vessel).  2. Chronic systolic CHF: Ischemic cardiomyopathy.  Medtronic CRT-D device, LV lead has been off due to high thresholds.  - Echo (1/18): EF 40-45%, diffuse hypokinesis with apical akinesis.  3. History of VT: VT storm in 10/17, sotalol started.  4. Hemochromatosis 5. Atrial fibrillation: History of paroxysmal atrial fibrillation per chart but patient knows nothing about this and has not occurred recently.  He is not anticoagulated.  6. OSA 7. CKD 8. Type II diabetes  Family History  Problem Relation Age of Onset  . Adopted: Yes  Does not know family history (adopted)  Social History   Social History  . Marital status: Married    Spouse name: N/A  . Number of children: 1  . Years of education: N/A   Occupational History  . telephone service    Social History Main Topics  . Smoking status: Former Smoker    Packs/day: 2.00    Years: 30.00    Types: Cigarettes    Quit date: 03/11/2005  . Smokeless tobacco: Never Used  . Alcohol use 1.8 oz/week    3 Cans of beer per week     Comment: weekly  . Drug use: No  . Sexual activity: Not Currently   Other Topics Concern  . Not on file   Social History Narrative  . No narrative on file    Current Facility-Administered Medications  Medication Dose Route Frequency  Provider Last Rate Last Dose  . amiodarone (NEXTERONE PREMIX) 360-4.14 MG/200ML-% (1.8 mg/mL) IV infusion  60 mg/hr Intravenous Continuous Benjiman CoreNathan Pickering, MD 33.3 mL/hr at 05/08/16 2204 60 mg/hr at 05/08/16 2204   Followed by  . [START ON 05/09/2016] amiodarone (NEXTERONE PREMIX) 360-4.14 MG/200ML-% (1.8 mg/mL) IV infusion  30 mg/hr Intravenous Continuous Benjiman CoreNathan Pickering, MD       Current Outpatient Prescriptions  Medication Sig Dispense Refill  . aspirin 81 MG tablet Take 81 mg by mouth daily.    . carvedilol (COREG) 25 MG tablet Take 1 tablet (25 mg total) by mouth 2 (two) times daily with a meal. 135 tablet 2  . fenofibrate 160 MG tablet TAKE 1 TABLET BY  MOUTH DAILY. 90 tablet 2  . furosemide (LASIX) 20 MG tablet TAKE 1/2 TABLET BY MOUTH DAILY. 30 tablet 11  . lisinopril (PRINIVIL,ZESTRIL) 10 MG tablet Take 0.5 tablets (5 mg total) by mouth daily. 30 tablet 11  . magnesium oxide (MAG-OX) 400 (241.3 Mg) MG tablet Take 1 tablet (400 mg total) by mouth daily. 30 tablet 11  . metFORMIN (GLUCOPHAGE) 500 MG tablet Take 1,000 mg by mouth 2 (two) times daily with a meal.     . NITROSTAT 0.4 MG SL tablet PLACE 1 TABLET UNDER THE TONGUE EVERY 5 MINUTES AS NEEDED FOR CHEST PAIN FOR UP TO 3 DOSES. 25 tablet 3  . Omega-3 Fatty Acids (RA FISH OIL) 1400 MG CPDR Take 1,400 mg by mouth 2 (two) times daily.    Letta Pate. ONETOUCH VERIO test strip 1 each by Other route as needed for other.   6  . sotalol (BETAPACE) 120 MG tablet Take 1 tablet (120 mg total) by mouth 2 (two) times daily. 60 tablet 11  . spironolactone (ALDACTONE) 25 MG tablet Take 1 tablet (25 mg total) by mouth daily. 30 tablet 11    Physical Exam: Blood pressure 110/69, pulse 88, temperature 98.8 F (37.1 C), temperature source Oral, resp. rate 14, height 6' (1.829 m), weight 209 lb (94.8 kg), SpO2 96 %.  General: NAD Neck: No JVD, no thyromegaly or thyroid nodule.  Lungs: Clear to auscultation bilaterally with normal respiratory effort. CV: Nondisplaced PMI.  Heart regular S1/S2, no S3/S4, no murmur.  No peripheral edema.  No carotid bruit.  Normal pedal pulses.  Abdomen: Soft, nontender, no hepatosplenomegaly, no distention.  Skin: Intact without lesions or rashes.  Neurologic: Alert and oriented x 3.  Psych: Normal affect. Extremities: No clubbing or cyanosis.  HEENT: Normal.   Labs: BMET    Component Value Date/Time   NA 135 05/08/2016 2223   K 3.7 05/08/2016 2223   CL 101 05/08/2016 2223   CO2 20 (L) 05/08/2016 2223   GLUCOSE 230 (H) 05/08/2016 2223   BUN 24 (H) 05/08/2016 2223   CREATININE 1.47 (H) 05/08/2016 2223   CREATININE 1.49 (H) 02/10/2016 0836   CALCIUM 9.5 05/08/2016 2223     GFRNONAA 50 (L) 05/08/2016 2223   GFRAA 58 (L) 05/08/2016 2223   CBC    Component Value Date/Time   WBC 9.0 05/08/2016 2223   RBC 3.98 (L) 05/08/2016 2223   HGB 12.9 (L) 05/08/2016 2223   HCT 37.0 (L) 05/08/2016 2223   PLT 207 05/08/2016 2223   MCV 93.0 05/08/2016 2223   MCH 32.4 05/08/2016 2223   MCHC 34.9 05/08/2016 2223   RDW 11.9 05/08/2016 2223   LYMPHSABS 1.8 05/08/2016 2223   MONOABS 0.7 05/08/2016 2223   EOSABS 0.1 05/08/2016 2223  BASOSABS 0.0 05/08/2016 2223   TnI < 0.03  Radiology:  - CXR: Vascular congestion  EKG: NSR, IVCD (152 msec), old anterior MI, old inferior MI, QTc 493  ASSESSMENT AND PLAN: 61 yo with history of CAD s/p CABG, ischemic cardiomyopathy/chronic systolic CHF, Medtronic CRT-D, hemochromatosis, h/o VT on sotalol presented tonight after having appropriate ICD discharge for VT.  1. VT: My suspicion is that this was scar-mediated.  He had VT in 10/17, LHC showed stable CAD with patent grafts.  No chest pain prior to the current episode and troponin initially is not elevated.  He has been taking sotalol as ordered.   - Sotalol does not appear effective.  Last dose was this morning.  He was started on amiodarone gtt with ongoing NSVT in the ER, I will continue this.  Amiodarone is not ideal long-term with relatively young age and history of hemochromatosis.  Would consider VT ablation as an option.  - Replace K and Mg.  - Continue Coreg 25 mg bid.  2. CAD: s/p CABG.  Stable anatomy in 10/17 in the setting of VT.  No chest pain, troponin negative so far.  No indication for cath at this point.  - Cycle troponin.  - Continue ASA 81.  He has not tolerated statins or Zetia (would consider Repatha in the future).  3. Acute on chronic systolic CHF: Echo (1/18) with EF 40-45%.  Per notes, LV lead has been turned off due to high thresholds.  He has a Medtronic CRT-D device.  NYHA class II-III symptoms, stable.  He does appear at least mildly volume overloaded on  exam.  CXR with vascular congestion.  - Would give 2 doses of Lasix 40 mg IV bid, then resume po Lasix.  - Continue Coreg, lisinopril, spironolactone.  4. CKD: Stage III.  Creatinine is at his baseline.  5. OSA: Has refused CPAP.   Signed: Marca Ancona 05/08/2016, 10:26 PM

## 2016-05-08 NOTE — ED Provider Notes (Signed)
[facility]-EM[facility]Y DEPT Provider Note   CSN: [id number] Arrival date & time: 05/08/16  2129     History   Chief Complaint Chief Complaint  Patient presents with  . Chest Pain    HPI [name] is a 61 y.o. male.  HPI Patient presents after syncopal episode. AICD reportedly fire. Patient felt lightheaded and passed out. Was incontinent of urine and later vomited. Found to have episodes of V. tach. History of AICD for V. tach. It had been firing also back in October. States he's been doing well last few days. States he had 3 drinks tonight. No chest pain. States she's been doing well otherwise. Does have some chronic dyspnea with exertion and difficulty walking. States this is unchanged. Has some swelling in legs but states this is at his normal level.   Past Medical History:  Diagnosis Date  . AMI anterior wall (HCC) 2006  . Anemia   . Angina    "upon exertion"  . CHF (congestive heart failure) (HCC)   . Chronic systolic heart failure (HCC)    class 2-3  . Coronary artery disease   . Exertional dyspnea   . Hemochromatosis   . Hyperlipidemia   . ICD (implantable cardiac defibrillator) in place   . ICD-Medtronic-CRT 08/24/2011  . Ischemic cardiomyopathy    EF 36%; prior anterior MI, s/p CABG x 3; s/p cath Feb 2013 showing grafts to be patent with severe LV dysfunction  . Pacemaker   . PAF (paroxysmal atrial fibrillation) (HCC)   . 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 RBBB (right bundle branch block)   . Rotator cuff injury   . Sleep apnea    no cpap  . Type II diabetes mellitus (HCC)   . Ventricular tachycardia, 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    Patient Active Problem List   Diagnosis Date Noted  . Ventricular tachycardia, 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  . Postop check 02/19/2012  . Right inguinal hernia 01/02/2012  . Encounter for implantable defibrillator reprogramming or check 08/24/2011  . Chronic systolic heart failure (HCC)   . Diabetes  mellitus type II, controlled (HCC) 10/07/2010  . Ischemic cardiomyopathy   . Hyperlipidemia   . RBBB (right bundle branch block)   . PAF (paroxysmal atrial fibrillation) (HCC)   . CAD (coronary artery disease)     Past Surgical History:  Procedure Laterality Date  . BI-VENTRICULAR IMPLANTABLE CARDIOVERTER DEFIBRILLATOR N/A 08/21/2011   Procedure: BI-VENTRICULAR IMPLANTABLE CARDIOVERTER DEFIBRILLATOR  (CRT-D);  Surgeon: Duke SalviaSteven C Klein, MD;  [facility];  Service: Cardiovascular;  Laterality: N/A;  . BIV ICD GENERTAOR CHANGE OUT N/A 06/12/2014   Procedure: BIV ICD GENERTAOR CHANGE OUT;  Surgeon: Duke SalviaSteven C Klein, MD;  [facility];  Service: Cardiovascular;  Laterality: N/A;  . CARDIAC CATHETERIZATION  03/12/2005   INFERIOR WALL AKINESIA. THERE IS GLOBAL HYPOKINESIA WITH OVERALL MODERATE TO SEVERE LEFT VENTRICULAR SYSTOLIC DYSFUNCTION. EF 35%  . CARDIAC CATHETERIZATION  Feb 2013   Grafts patent. EF is 20 to 25%.  Marland Kitchen. CARDIAC CATHETERIZATION N/A 01/19/2016   Procedure: Left Heart Cath and Cors/Grafts Angiography;  Surgeon: Peter M SwazilandJordan, MD;  [facility];  Service: Cardiovascular;  Laterality: N/A;  . CARDIAC DEFIBRILLATOR PLACEMENT  08/21/11   "pacer/defib"  . CORONARY ARTERY BYPASS GRAFT  2006   Emergent CABG x 3 with LIMA to LAD, SVG to OM, SVG to distal RCA per Dr. Tyrone SageGerhardt  . INGUINAL HERNIA REPAIR  02/09/2012   WITH  MESH  . INGUINAL HERNIA REPAIR  02/09/2012   Procedure: HERNIA REPAIR INGUINAL ADULT;  Surgeon: Cherylynn Ridges, MD;  Location: [facility] OR;  Service: General;  Laterality: Right;  . INSERT / REPLACE / REMOVE PACEMAKER    . INSERTION OF MESH  02/09/2012   Procedure: INSERTION OF MESH;  Surgeon: Cherylynn Ridges, MD;  Location: [facility] OR;  Service: General;  Laterality: Right;  . JOINT REPLACEMENT     bil shoulders  . LEFT HEART CATHETERIZATION WITH CORONARY/GRAFT ANGIOGRAM N/A 05/25/2011   Procedure: LEFT HEART CATHETERIZATION WITH Isabel Caprice;   Surgeon: Peter M Swaziland, MD;  Location: [facility] CATH LAB;  Service: Cardiovascular;  Laterality: N/A;  . ROTATOR CUFF REPAIR  ?2000/~ 2007   left; right        Home Medications    Prior to Admission medications   Medication Sig Start Date End Date Taking? Authorizing Provider  aspirin 81 MG tablet Take 81 mg by mouth daily.   Yes Historical Provider, MD  carvedilol (COREG) 25 MG tablet Take 1 tablet (25 mg total) by mouth 2 (two) times daily with a meal. 01/21/16  Yes Rhonda G Barrett, PA-C  fenofibrate 160 MG tablet TAKE 1 TABLET BY MOUTH DAILY. 10/25/15  Yes Duke Salvia, MD  furosemide (LASIX) 20 MG tablet TAKE 1/2 TABLET BY MOUTH DAILY. 11/19/15  Yes Peter M [place]azil[place], MD  lisinopril (PRINIVIL,ZESTRIL) 10 MG tablet Take 0.5 tablets (5 mg total) by mouth daily. 01/03/16  Yes Azalee Course, PA  magnesium oxide (MAG-OX) 400 (241.3 Mg) MG tablet Take 1 tablet (400 mg total) by mouth daily. 01/22/16  Yes Rhonda G Barrett, PA-C  metFORMIN (GLUCOPHAGE) 500 MG tablet Take 1,000 mg by mouth 2 (two) times daily with a meal.    Yes Historical Provider, MD  NITROSTAT 0.4 MG SL tablet PLACE 1 TABLET UNDER THE TONGUE EVERY 5 MINUTES AS NEEDED FOR CHEST PAIN FOR UP TO 3 DOSES. 04/28/15  Yes Rosalio Macadamia, NP  Omega-3 Fatty Acids (RA FISH OIL) 1400 MG CPDR Take 1,400 mg by mouth 2 (two) times daily.   Yes Historical Provider, MD  sotalol (BETAPACE) 120 MG tablet Take 1 tablet (120 mg total) by mouth 2 (two) times daily. 01/21/16  Yes Rhonda G Barrett, PA-C  spironolactone (ALDACTONE) 25 MG tablet Take 1 tablet (25 mg total) by mouth daily. 01/21/16  Yes Rhonda G Barrett, PA-C  ONETOUCH VERIO test strip 1 each by Other route as needed for other.  09/29/15   Historical Provider, MD    Family History Family History  Problem Relation Age of Onset  . Adopted: Yes    Social History Social History  Substance Use Topics  . Smoking status: Former Smoker    Packs/day: 2.00    Years: 30.00    Types: Cigarettes     Quit date: 03/11/2005  . Smokeless tobacco: Never Used  . Alcohol use 1.8 oz/week    3 Cans of beer per week     Comment: weekly     Allergies   Statins and Zetia [ezetimibe]   Review of Systems Review of Systems  Constitutional: Negative for appetite change and fever.  HENT: Negative for congestion.   Respiratory: Positive for shortness of breath.   Cardiovascular: Negative for chest pain.  Gastrointestinal: Positive for vomiting.  Genitourinary: Negative for difficulty urinating and hematuria.  Musculoskeletal: Negative for back pain.  Neurological: Positive for syncope. Negative for headaches.  Hematological: Negative for adenopathy.  Psychiatric/Behavioral: Negative for  confusion.     Physical Exam Updated Vital Signs BP 110/69 (BP Location: Right Arm)   Pulse 88   Temp 98.8 F (37.1 C) (Oral)   Resp 14   Ht 6' (1.829 m)   Wt 209 lb (94.8 kg)   SpO2 96%   BMI 28.35 kg/m   Physical Exam  Constitutional: He appears well-developed.  HENT:  Head: Atraumatic.  Eyes: EOM are normal.  Neck: Neck supple.  Cardiovascular: Normal rate.   Pulmonary/Chest: Effort normal. He has no rales.  Scar from previous median sternotomy. AICD to left upper chest wall. Patient has had runs of nonsustained V. tach while he was being monitored.  Abdominal: Soft.  Musculoskeletal: He exhibits edema.  Neurological: He is alert.  Skin: Skin is warm. Capillary refill takes less than 2 seconds.     ED Treatments / Results  Labs (all labs ordered are listed, but only abnormal results are displayed) Labs Reviewed  COMPREHENSIVE METABOLIC PANEL - Abnormal; Notable for the following:       Result Value   CO2 20 (*)    Glucose, Bld 230 (*)    BUN 24 (*)    Creatinine, Ser 1.47 (*)    Total Protein 6.3 (*)    Alkaline Phosphatase 34 (*)    GFR calc non Af Amer 50 (*)    GFR calc Af Amer 58 (*)    All other components within normal limits  CBC WITH DIFFERENTIAL/PLATELET - Abnormal;  Notable for the following:    RBC 3.98 (*)    Hemoglobin 12.9 (*)    HCT 37.0 (*)    All other components within normal limits  ETHANOL - Abnormal; Notable for the following:    Alcohol, Ethyl (B) 21 (*)    All other components within normal limits  TROPONIN I  MAGNESIUM    EKG  EKG Interpretation  Date/Time:  Monday May 08 2016 21:39:24 EST Ventricular Rate:  81 PR Interval:    QRS Duration: 152 QT Interval:  424 QTC Calculation: 493 R Axis:   -57 Text Interpretation:  Sinus rhythm Ventricular premature complex Nonspecific IVCD with LAD LVH with secondary repolarization abnormality Inferior infarct, old Anterolateral infarct, age indeterminate last tracing was paced Confirmed by Rubin Payor  MD, Brityn Mastrogiovanni [phone]) on 05/08/2016 10:10:52 PM       Radiology Dg Chest Port 1 View  Result Date: 05/08/2016 CLINICAL DATA:  Ventricular tachycardia, acute onset. Defibrillator fired. Gurgling and vomiting. Assess for aspiration. Initial encounter. EXAM: PORTABLE CHEST 1 VIEW COMPARISON:  Chest radiograph performed 01/19/2016 FINDINGS: The lungs are well-aerated. Vascular congestion is noted. Increased interstitial markings may reflect mild transient interstitial edema. There is no evidence of pleural effusion or pneumothorax. The cardiomediastinal silhouette is borderline enlarged. The patient is status post median sternotomy. A pacemaker is noted overlying the left chest wall, with leads ending overlying the right atrium, right ventricle and coronary sinus. No acute osseous abnormalities are seen. An external pacing pad is noted. IMPRESSION: Vascular congestion and borderline cardiomegaly. Increased interstitial markings may reflect mild transient interstitial edema. No radiographic evidence for aspiration at this time. Electronically Signed   By: Roanna Raider M.D.   On: 05/08/2016 22:20    Procedures Procedures (including critical care time)  Medications Ordered in ED Medications    amiodarone (NEXTERONE PREMIX) 360-4.14 MG/200ML-% (1.8 mg/mL) IV infusion (60 mg/hr Intravenous New Bag/Given 05/08/16 2204)    Followed by  amiodarone (NEXTERONE PREMIX) 360-4.14 MG/200ML-% (1.8 mg/mL) IV infusion (not administered)  magnesium sulfate IVPB 2 g 50 mL (not administered)  potassium chloride SA (K-DUR,KLOR-CON) CR tablet 40 mEq (not administered)  amiodarone (NEXTERONE) 1.8 mg/mL load via infusion 150 mg (150 mg Intravenous Bolus from Bag 05/08/16 2204)     Initial Impression / Assessment and Plan / ED Course  I have reviewed the triage vital signs and the nursing notes.  Pertinent labs & imaging results that were available during my care of the patient were reviewed by me and considered in my medical decision making (see chart for details).     Patient presents after syncopal episode. Had episode of ventricular tachycardia that was terminated by his defibrillator. Had shorter runs of nonsustained V. tach here. Discussed with Dr. Shirlee Latch and started on amiodarone. Admit to cardiology.   Final Clinical Impressions(s) / ED Diagnoses   Final diagnoses:  Ventricular tachycardia [facility])    New Prescriptions New Prescriptions   No medications on file     Benjiman Core, MD 05/08/16 2321

## 2016-05-08 NOTE — ED Triage Notes (Signed)
Pt to ED via [facility] after reported having SVT while out eating.  Bystanders reported to EMS that pt had a syncopal episode and the defib fired.  Enroute to ED pt had a run of SVT but converted on his own.  Pt c/o pain in right chest at this time.

## 2016-05-09 ENCOUNTER — Encounter (HOSPITAL_COMMUNITY): Payer: Self-pay | Admitting: Nurse Practitioner

## 2016-05-09 DIAGNOSIS — I25810 Atherosclerosis of coronary artery bypass graft(s) without angina pectoris: Secondary | ICD-10-CM

## 2016-05-09 DIAGNOSIS — I255 Ischemic cardiomyopathy: Secondary | ICD-10-CM

## 2016-05-09 DIAGNOSIS — I472 Ventricular tachycardia, unspecified: Secondary | ICD-10-CM

## 2016-05-09 DIAGNOSIS — I5022 Chronic systolic (congestive) heart failure: Secondary | ICD-10-CM

## 2016-05-09 DIAGNOSIS — I519 Heart disease, unspecified: Secondary | ICD-10-CM

## 2016-05-09 LAB — BASIC METABOLIC PANEL
Anion gap: 11 (ref 5–15)
BUN: 20 mg/dL (ref 6–20)
CALCIUM: 9.7 mg/dL (ref 8.9–10.3)
CO2: 23 mmol/L (ref 22–32)
Chloride: 103 mmol/L (ref 101–111)
Creatinine, Ser: 1.4 mg/dL — ABNORMAL HIGH (ref 0.61–1.24)
GFR calc Af Amer: >60 mL/min (ref >60)
GFR, EST NON AFRICAN AMERICAN: 53 mL/min — AB (ref >60)
GLUCOSE: 180 mg/dL — AB (ref 65–99)
POTASSIUM: 4.5 mmol/L (ref 3.5–5.1)
Sodium: 137 mmol/L (ref 135–145)

## 2016-05-09 LAB — GLUCOSE, CAPILLARY
GLUCOSE-CAPILLARY: 198 mg/dL — AB (ref 65–99)
GLUCOSE-CAPILLARY: 201 mg/dL — AB (ref 65–99)
GLUCOSE-CAPILLARY: 293 mg/dL — AB (ref 65–99)
Glucose-Capillary: 163 mg/dL — ABNORMAL HIGH (ref 65–99)
Glucose-Capillary: 277 mg/dL — ABNORMAL HIGH (ref 65–99)

## 2016-05-09 LAB — CBC
HEMATOCRIT: 40.2 % (ref 39.0–52.0)
Hemoglobin: 14.2 g/dL (ref 13.0–17.0)
MCH: 32.6 pg (ref 26.0–34.0)
MCHC: 35.3 g/dL (ref 30.0–36.0)
MCV: 92.2 fL (ref 78.0–100.0)
Platelets: 237 10*3/uL (ref 150–400)
RBC: 4.36 MIL/uL (ref 4.22–5.81)
RDW: 12 % (ref 11.5–15.5)
WBC: 9.2 10*3/uL (ref 4.0–10.5)

## 2016-05-09 LAB — TROPONIN I
Troponin I: 0.03 ng/mL (ref <0.03)
Troponin I: 0.03 ng/mL (ref <0.03)

## 2016-05-09 LAB — MRSA PCR SCREENING: MRSA BY PCR: NEGATIVE

## 2016-05-09 LAB — BRAIN NATRIURETIC PEPTIDE: B NATRIURETIC PEPTIDE 5: 149.5 pg/mL — AB (ref 0.0–100.0)

## 2016-05-09 MED ORDER — RA FISH OIL 1400 MG PO CPDR: 1400.0000 mg | DELAYED_RELEASE_CAPSULE | Freq: Two times a day (BID) | ORAL | Status: DC

## 2016-05-09 MED ORDER — HEPARIN SODIUM (PORCINE) 5000 UNIT/ML IJ SOLN
5000.0000 [IU] | Freq: Three times a day (TID) | INTRAMUSCULAR | Status: DC
  Filled 2016-05-09 (×6): qty 1

## 2016-05-09 MED ORDER — INSULIN ASPART 100 UNIT/ML ~~LOC~~ SOLN
0.0000 [IU] | Freq: Three times a day (TID) | SUBCUTANEOUS | Status: DC
  Administered 2016-05-09: 17:00:00 5 [IU] via SUBCUTANEOUS
  Administered 2016-05-09 – 2016-05-10 (×2): 3 [IU] via SUBCUTANEOUS
  Administered 2016-05-10: 13:00:00 5 [IU] via SUBCUTANEOUS
  Administered 2016-05-10 – 2016-05-11 (×3): 3 [IU] via SUBCUTANEOUS
  Administered 2016-05-11: 12:00:00 8 [IU] via SUBCUTANEOUS
  Administered 2016-05-12: 3 [IU] via SUBCUTANEOUS

## 2016-05-09 MED ORDER — ONDANSETRON HCL 4 MG/2ML IJ SOLN: 4.0000 mg | Freq: Four times a day (QID) | INTRAMUSCULAR | Status: DC | PRN

## 2016-05-09 MED ORDER — MAGNESIUM OXIDE 400 (241.3 MG) MG PO TABS
400.0000 mg | ORAL_TABLET | Freq: Every day | ORAL | Status: DC
  Administered 2016-05-09 – 2016-05-11 (×3): 400 mg via ORAL
  Filled 2016-05-09 (×5): qty 1

## 2016-05-09 MED ORDER — FUROSEMIDE 20 MG PO TABS
10.0000 mg | ORAL_TABLET | Freq: Two times a day (BID) | ORAL | Status: DC
  Administered 2016-05-09 – 2016-05-12 (×7): 10 mg via ORAL
  Filled 2016-05-09 (×7): qty 1

## 2016-05-09 MED ORDER — OMEGA-3-ACID ETHYL ESTERS 1 G PO CAPS
1.0000 g | ORAL_CAPSULE | Freq: Two times a day (BID) | ORAL | Status: DC
  Administered 2016-05-09 – 2016-05-11 (×6): 1 g via ORAL
  Filled 2016-05-09 (×7): qty 1

## 2016-05-09 MED ORDER — ASPIRIN 81 MG PO TABS: 81.0000 mg | ORAL_TABLET | Freq: Every day | ORAL | Status: DC

## 2016-05-09 MED ORDER — POTASSIUM CHLORIDE CRYS ER 20 MEQ PO TBCR
40.0000 meq | EXTENDED_RELEASE_TABLET | Freq: Every day | ORAL | Status: DC
  Administered 2016-05-09 – 2016-05-11 (×3): 40 meq via ORAL
  Filled 2016-05-09 (×5): qty 2

## 2016-05-09 MED ORDER — FENOFIBRATE 160 MG PO TABS
160.0000 mg | ORAL_TABLET | Freq: Every day | ORAL | Status: DC
  Administered 2016-05-09 – 2016-05-11 (×3): 160 mg via ORAL
  Filled 2016-05-09 (×4): qty 1

## 2016-05-09 MED ORDER — NITROGLYCERIN 0.4 MG SL SUBL: 0.4000 mg | SUBLINGUAL_TABLET | SUBLINGUAL | Status: DC | PRN

## 2016-05-09 MED ORDER — SPIRONOLACTONE 25 MG PO TABS
25.0000 mg | ORAL_TABLET | Freq: Every day | ORAL | Status: DC
  Administered 2016-05-09 – 2016-05-12 (×4): 25 mg via ORAL
  Filled 2016-05-09 (×4): qty 1

## 2016-05-09 MED ORDER — AMIODARONE HCL 200 MG PO TABS
200.0000 mg | ORAL_TABLET | Freq: Two times a day (BID) | ORAL | Status: DC
  Administered 2016-05-09: 20:00:00 200 mg via ORAL
  Filled 2016-05-09 (×2): qty 1

## 2016-05-09 MED ORDER — FUROSEMIDE 10 MG/ML IJ SOLN
40.0000 mg | Freq: Two times a day (BID) | INTRAMUSCULAR | Status: DC
  Administered 2016-05-09: 40 mg via INTRAVENOUS
  Filled 2016-05-09: qty 4

## 2016-05-09 MED ORDER — ACETAMINOPHEN 325 MG PO TABS: 650.0000 mg | ORAL_TABLET | ORAL | Status: DC | PRN

## 2016-05-09 MED ORDER — INSULIN ASPART 100 UNIT/ML ~~LOC~~ SOLN
0.0000 [IU] | Freq: Every day | SUBCUTANEOUS | Status: DC
  Administered 2016-05-09: 3 [IU] via SUBCUTANEOUS
  Administered 2016-05-10: 2 [IU] via SUBCUTANEOUS

## 2016-05-09 MED ORDER — CARVEDILOL 12.5 MG PO TABS
25.0000 mg | ORAL_TABLET | Freq: Two times a day (BID) | ORAL | Status: DC
  Administered 2016-05-09 – 2016-05-12 (×7): 25 mg via ORAL
  Filled 2016-05-09 (×7): qty 2

## 2016-05-09 MED ORDER — LISINOPRIL 5 MG PO TABS
5.0000 mg | ORAL_TABLET | Freq: Every day | ORAL | Status: DC
  Administered 2016-05-09 – 2016-05-12 (×4): 5 mg via ORAL
  Filled 2016-05-09 (×4): qty 1

## 2016-05-09 MED ORDER — ASPIRIN EC 81 MG PO TBEC
81.0000 mg | DELAYED_RELEASE_TABLET | Freq: Every day | ORAL | Status: DC
  Administered 2016-05-09 – 2016-05-12 (×4): 81 mg via ORAL
  Filled 2016-05-09 (×4): qty 1

## 2016-05-09 NOTE — Consult Note (Signed)
ELECTROPHYSIOLOGY CONSULT NOTE    Patient ID: [name] MRN: [id number], DOB/AGE: [birth year] 61 y.o.  Admit date: 05/08/2016 Date of Consult: 05/09/2016  Primary Physician: Minda Meo, MD Primary Cardiologist: [place] Electrophysiologist: Graciela Husbands Referring Physician: Antoine Poche   Reason for Consultation: VT with appropriate ICD therapy   HPI:  [name] is a 61 y.o. male with a past medical history significant for CAD, ischemic cardiomyopathy, OSA, diabetes, chronic systolic heart failure, and prior appropriate therapy for ventricular tachycardia. He presented in August of 2017 with inappropriate ICD therapy 2/2 ungrounded pool.  He then was admitted 01/2016 with appropriate therapy and was placed on Sotalol. He has done well since then with no recurrent ICD shocks until yesterday. He was having dinner at the [facility] when he stood up to pay for his dinner (hot dog) and developed a sensation of warmth and extreme dizziness.  He does not remember anything else until he was in the ambulance with EMS.  He receive a short period of bystander CPR.  He was transferred to [facility] and device interrogation demonstrated appropriate therapy for VT that started below the VT zone and accelerated into the VF zone with appropriate ICD shock terminating VF and restoring SR.  It is difficult to know how long he was in VT for below his zone, but it was long enough for him to have syncope and require bystander CPR.  He has been admitted and started on IV amiodarone. Labs were grossly unremarkable. He underwent ischemic evaluation after appropriate therapy in October of 2017 with patent grafts and no targets for revascularization.  He currently feels well with no chest pain, shortness of breath at rest, LE edema, recent fevers, chills, nausea or vomiting. He has stable shortness of breath with exertion.   Echo 04/2016 demonstrated EF 40-45%, grade 1 diastolic dysfunction, PA pressure 25.   Past  Medical History:  Diagnosis Date  . Anemia   . CHF (congestive heart failure) (HCC)   . Coronary artery disease   . Hemochromatosis   . Hyperlipidemia   . Ischemic cardiomyopathy    EF 36%; prior anterior MI, s/p CABG x 3; s/p cath Feb 2013 showing grafts to be patent with severe LV dysfunction  . PAF (paroxysmal atrial fibrillation) (HCC)   . RBBB (right bundle branch block)   . Rotator cuff injury   . Sleep apnea    no cpap  . Type II diabetes mellitus (HCC)   . Ventricular tachycardia, 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     Surgical History:  Past Surgical History:  Procedure Laterality Date  . BI-VENTRICULAR IMPLANTABLE CARDIOVERTER DEFIBRILLATOR N/A 08/21/2011   Procedure: BI-VENTRICULAR IMPLANTABLE CARDIOVERTER DEFIBRILLATOR  (CRT-D);  Surgeon: Duke Salvia, MD;  Location: [facility] CATH LAB;  Service: Cardiovascular;  Laterality: N/A;  . BIV ICD GENERTAOR CHANGE OUT N/A 06/12/2014   Procedure: BIV ICD GENERTAOR CHANGE OUT;  Surgeon: Duke Salvia, MD;  Location: [facility] CATH LAB;  Service: Cardiovascular;  Laterality: N/A;  . CARDIAC CATHETERIZATION N/A 01/19/2016   Procedure: Left Heart Cath and Cors/Grafts Angiography;  Surgeon: Peter M Swaziland, MD;  Location: [facility] INVASIVE CV LAB;  Service: Cardiovascular;  Laterality: N/A;  . CORONARY ARTERY BYPASS GRAFT  2006   Emergent CABG x 3 with LIMA to LAD, SVG to OM, SVG to distal RCA per Dr. Tyrone Sage  . INGUINAL HERNIA REPAIR  02/09/2012   Procedure: HERNIA REPAIR INGUINAL ADULT;  Surgeon: Cherylynn Ridges, MD;  Location: [facility] OR;  Service: General;  Laterality: Right;  . INSERTION OF MESH  02/09/2012   Procedure: INSERTION OF MESH;  Surgeon: Cherylynn Ridges, MD;  Location: [facility] OR;  Service: General;  Laterality: Right;  . LEFT HEART CATHETERIZATION WITH CORONARY/GRAFT ANGIOGRAM N/A 05/25/2011   Procedure: LEFT HEART CATHETERIZATION WITH Isabel Caprice;  Surgeon: Peter M Swaziland, MD;  Location: [facility] CATH LAB;  Service: Cardiovascular;  Laterality: N/A;  .  ROTATOR CUFF REPAIR  ?2000/~ 2007   left; right      Prescriptions Prior to Admission  Medication Sig Dispense Refill Last Dose  . aspirin 81 MG tablet Take 81 mg by mouth daily.   05/08/2016 at Unknown time  . carvedilol (COREG) 25 MG tablet Take 1 tablet (25 mg total) by mouth 2 (two) times daily with a meal. 135 tablet 2 05/08/2016 at Unknown time  . fenofibrate 160 MG tablet TAKE 1 TABLET BY MOUTH DAILY. 90 tablet 2 05/07/2016 at Unknown time  . furosemide (LASIX) 20 MG tablet TAKE 1/2 TABLET BY MOUTH DAILY. 30 tablet 11 05/08/2016 at Unknown time  . lisinopril (PRINIVIL,ZESTRIL) 10 MG tablet Take 0.5 tablets (5 mg total) by mouth daily. 30 tablet 11 05/08/2016 at Unknown time  . magnesium oxide (MAG-OX) 400 (241.3 Mg) MG tablet Take 1 tablet (400 mg total) by mouth daily. 30 tablet 11 05/07/2016 at Unknown time  . metFORMIN (GLUCOPHAGE) 500 MG tablet Take 1,000 mg by mouth 2 (two) times daily with a meal.    05/08/2016 at Unknown time  . NITROSTAT 0.4 MG SL tablet PLACE 1 TABLET UNDER THE TONGUE EVERY 5 MINUTES AS NEEDED FOR CHEST PAIN FOR UP TO 3 DOSES. 25 tablet 3 unknown  . Omega-3 Fatty Acids (RA FISH OIL) 1400 MG CPDR Take 1,400 mg by mouth 2 (two) times daily.   05/08/2016 at Unknown time  . sotalol (BETAPACE) 120 MG tablet Take 1 tablet (120 mg total) by mouth 2 (two) times daily. 60 tablet 11 05/08/2016 at Unknown time  . spironolactone (ALDACTONE) 25 MG tablet Take 1 tablet (25 mg total) by mouth daily. 30 tablet 11 05/08/2016 at Unknown time  . ONETOUCH VERIO test strip 1 each by Other route as needed for other.   6 Taking    Inpatient Medications:  . aspirin EC  81 mg Oral Daily  . carvedilol  25 mg Oral BID WC  . fenofibrate  160 mg Oral Daily  . furosemide  10 mg Oral BID  . heparin  5,000 Units Subcutaneous Q8H  . insulin aspart  0-15 Units Subcutaneous TID WC  . insulin aspart  0-5 Units Subcutaneous QHS  . lisinopril  5 mg Oral Daily  . magnesium oxide  400 mg Oral Daily  .  omega-3 acid ethyl esters  1 g Oral BID  . potassium chloride  40 mEq Oral Daily  . spironolactone  25 mg Oral Daily    Allergies:  Allergies  Allergen Reactions  . Statins Other (See Comments)    Myalgias   . Zetia [Ezetimibe] Other (See Comments)    myalgias    Social History   Social History  . Marital status: Married    Spouse name: N/A  . Number of children: 1  . Years of education: N/A   Occupational History  . telephone service    Social History Main Topics  . Smoking status: Former Smoker    Packs/day: 2.00    Years: 30.00    Types: Cigarettes    Quit date: 03/11/2005  .  Smokeless tobacco: Never Used  . Alcohol use 1.8 oz/week    3 Cans of beer per week     Comment: weekly  . Drug use: No  . Sexual activity: Not Currently   Other Topics Concern  . Not on file   Social History Narrative  . No narrative on file     Family History  Problem Relation Age of Onset  . Adopted: Yes     Review of Systems: All other systems reviewed and are otherwise negative except as noted above.  Physical Exam: Vitals:   05/09/16 0515 05/09/16 0530 05/09/16 0759 05/09/16 1138  BP: 112/60 115/62 116/62   Pulse: 63 61 69 64  Resp: 14 15    Temp:   98.3 F (36.8 C) 98.1 F (36.7 C)  TempSrc:   Oral Oral  SpO2: 94% 93% 97% 98%  Weight:      Height:        GEN- The patient is well appearing, alert and oriented x 3 today.   HEENT: normocephalic, atraumatic; sclera clear, conjunctiva pink; hearing intact; oropharynx clear; neck supple  Lungs- Clear to ausculation bilaterally, normal work of breathing.  No wheezes, rales, rhonchi Heart- Regular rate and rhythm (paced) GI- soft, non-tender, non-distended, bowel sounds present  Extremities- no clubbing, cyanosis, or edema; DP/PT/radial pulses 2+ bilaterally MS- no significant deformity or atrophy Skin- warm and dry, no rash or lesion Psych- euthymic mood, full affect Neuro- strength and sensation are  intact  Labs:   Lab Results  Component Value Date   WBC 9.2 05/09/2016   HGB 14.2 05/09/2016   HCT 40.2 05/09/2016   MCV 92.2 05/09/2016   PLT 237 05/09/2016     Recent Labs Lab 05/08/16 2223 05/09/16 0748  NA 135 137  K 3.7 4.5  CL 101 103  CO2 20* 23  BUN 24* 20  CREATININE 1.47* 1.40*  CALCIUM 9.5 9.7  PROT 6.3*  --   BILITOT 0.5  --   ALKPHOS 34*  --   ALT 23  --   AST 25  --   GLUCOSE 230* 180*      Radiology/Studies: Dg Chest Port 1 View Result Date: 05/08/2016 CLINICAL DATA:  Ventricular tachycardia, acute onset. Defibrillator fired. Gurgling and vomiting. Assess for aspiration. Initial encounter. EXAM: PORTABLE CHEST 1 VIEW COMPARISON:  Chest radiograph performed 01/19/2016 FINDINGS: The lungs are well-aerated. Vascular congestion is noted. Increased interstitial markings may reflect mild transient interstitial edema. There is no evidence of pleural effusion or pneumothorax. The cardiomediastinal silhouette is borderline enlarged. The patient is status post median sternotomy. A pacemaker is noted overlying the left chest wall, with leads ending overlying the right atrium, right ventricle and coronary sinus. No acute osseous abnormalities are seen. An external pacing pad is noted. IMPRESSION: Vascular congestion and borderline cardiomegaly. Increased interstitial markings may reflect mild transient interstitial edema. No radiographic evidence for aspiration at this time. Electronically Signed   By: Roanna Raider M.D.   On: 05/08/2016 22:20   [id number] rhythm, ventricular pacing, rate 64  TELEMETRY: sinus rhythm, ventricular pacing, NSVT   Device History: MDT CRTD implanted 2013 for ICM, CHF, gen change 2016 History of appropriate therapy: Yes History of AAD therapy: Yes - sotalol started 01/2016; amiodarone started 04/2016  Assessment/Plan: 1.  Hemodynamically unstable VT The patient presents with recurrent hemodynamically unstable VT with appropriate ICD therapy.  His VT started below his detection ( ) which is likely cause of syncope He had ischemic evaluation with cath  01/2016. I do not think there is a need to repeat Agree with IV amiodarone for now - convert to po tomorrow (400mg  bid x7 days, 200mg  bid x 14 days then 200mg  daily) ICD reprogrammed to VT zone of 164bpm with more ATP to try to terminate VT without ICD shocks Keep K >3.9, Mg >1.8 No driving x6 months - pt aware Could consider VT ablation in the future electively  2.  CAD s/p CABG No recent ischemic symptoms Cath 01/2016 with patent grafts  3.  Chronic systolic heart failure Euvolemic on exam Continue current therapy  LV lead off per Dr Graciela HusbandsKlein with underlying RBBB  Dr Johney FrameAllred to see later today Dr Graciela HusbandsKlein to see tomorrow   Gypsy BalsamAmber Seiler, NP 05/09/2016 12:01 PM  I have seen, examined the patient, and reviewed the above assessment and plan.  On exam, RRR.  Changes to above are made where necessary.  Device interrogation reviewed.  Device reprogrammed due to VT below detection.  amioadrone initiated by Dr Shirlee LatchMcLean.  Risks of this medicine were discussed with the patient who wishes to continue amiodarone.  Will convert from IV to PO.  Dr Graciela HusbandsKlein to see in am. Anticipate discharge to home in am with close outpatient follow-up by Dr Graciela HusbandsKlein. No driving x 6 months (pt aware)  Co Sign: Hillis RangeJames Trivia Heffelfinger, MD 05/09/2016 4:20 PM

## 2016-05-09 NOTE — Care Management Note (Signed)
Case Management Note  Patient Details  Name: [name] A Sabater MRN: [id number] Date of Birth: 09/20/1955  Subjective/Objective:  Presents with icd dishcarge for VT, has hx of CAD s/jp CABG, ischemic Cardiomyopathy, CHF.  Cards following, ? VT ablation, consulting EP.  conts on amiodarone drip , NCM will cont to follow for dc needs.                 Action/Plan:   Expected Discharge Date:                  Expected Discharge Plan:  Home/Self Care  In-House Referral:     Discharge planning Services  CM Consult  Post Acute Care Choice:    Choice offered to:     DME Arranged:    DME Agency:     HH Arranged:    HH Agency:     Status of Service:  In process, will continue to follow  If discussed at Long Length of Stay Meetings, dates discussed:    Additional Comments:  Leone Havenaylor, Athenia Rys Clinton, RN 05/09/2016, 2:40 PM

## 2016-05-09 NOTE — Progress Notes (Signed)
Inpatient Diabetes Program Recommendations  AACE/ADA: New Consensus Statement on Inpatient Glycemic Control (2015)  Target Ranges:  Prepandial:   less than 140 mg/dL      Peak postprandial:   less than 180 mg/dL (1-2 hours)      Critically ill patients:  140 - 180 mg/dL   Lab Results  Component Value Date   GLUCAP 163 (H) 05/09/2016   HGBA1C 6.0 10/06/2010   Results for [name], [name] (MRN [id number]) as of 05/09/2016 10:16  Ref. Range 05/08/2016 22:23  Glucose Latest Ref Range: 65 - 99 mg/dL 409230 (H)   Review of Glycemic Control  Diabetes history:     DM2, obesity,  Outpatient Diabetes medications:     Metformin 1000 mg BID Current orders for Inpatient glycemic control:     Moderate correction scale Novolog 0-15 units TIDAC and 0-5 units QHS  Inpatient Diabetes Program Recommendations:    Per ADA recommendations "consider performing an A1C on all patients with diabetes or hyperglycemia admitted to the hospital if not performed in the prior 3 months".  Thank you,  Kristine LineaKaren Helmut Hennon, RN, MSN Diabetes Coordinator Inpatient Diabetes Program [phone] (Team Pager)

## 2016-05-09 NOTE — Progress Notes (Signed)
CARDIAC REHAB PHASE I   PRE:  Rate/Rhythm: 64 pacing    BP: sitting 129/64    SaO2:   MODE:  Ambulation: 1000 ft   POST:  Rate/Rhythm: 72 pacing    BP: sitting 130/69     SaO2:   Tolerated well, no c/o except right side soreness. Pacing appropriately during walk, no NSVT. He can walk independently. [phone]   Harriet MassonRandi Kristan Stiven Kaspar CES, ACSM 05/09/2016 10:06 AM

## 2016-05-09 NOTE — Progress Notes (Signed)
Progress Note  Patient Name: Eliott NineLeslie A Krager Date of Encounter: 05/09/2016  Primary Cardiologist:   Dr. SwazilandJordan  Subjective   Chest sore but otherwise no complaints.   Inpatient Medications    Scheduled Meds: . aspirin EC  81 mg Oral Daily  . carvedilol  25 mg Oral BID WC  . fenofibrate  160 mg Oral Daily  . furosemide  40 mg Intravenous BID  . heparin  5,000 Units Subcutaneous Q8H  . insulin aspart  0-15 Units Subcutaneous TID WC  . insulin aspart  0-5 Units Subcutaneous QHS  . lisinopril  5 mg Oral Daily  . magnesium oxide  400 mg Oral Daily  . omega-3 acid ethyl esters  1 g Oral BID  . potassium chloride  40 mEq Oral Daily  . spironolactone  25 mg Oral Daily   Continuous Infusions: . amiodarone 30 mg/hr (05/09/16 0400)   PRN Meds: acetaminophen, nitroGLYCERIN, ondansetron (ZOFRAN) IV   Vital Signs    Vitals:   05/09/16 0430 05/09/16 0500 05/09/16 0515 05/09/16 0530  BP: 111/67 114/62 112/60 115/62  Pulse: 64 63 63 61  Resp: 16 14 14 15   Temp:      TempSrc:      SpO2: 93% 94% 94% 93%  Weight:      Height:       No intake or output data in the 24 hours ending 05/09/16 0756 Filed Weights   05/08/16 2151 05/09/16 0214  Weight: 209 lb (94.8 kg) 206 lb 9.1 oz (93.7 kg)    Telemetry    Runs of NSVT - Personally Reviewed  ECG    NA - Personally Reviewed  Physical Exam   GEN: No acute distress.   Neck: No JVD Cardiac: RRR, no murmurs, rubs, or gallops.  Respiratory: Clear  to auscultation bilaterally. GI: Soft, nontender, non-distended  MS: No edema; No deformity.  Chest sore on the right side to palpation.  Neuro:  Nonfocal  Psych: Normal  affect   Labs    Chemistry Recent Labs Lab 05/08/16 2223  NA 135  K 3.7  CL 101  CO2 20*  GLUCOSE 230*  BUN 24*  CREATININE 1.47*  CALCIUM 9.5  PROT 6.3*  ALBUMIN 3.8  AST 25  ALT 23  ALKPHOS 34*  BILITOT 0.5  GFRNONAA 50*  GFRAA 58*  ANIONGAP 14     Hematology Recent Labs Lab  05/08/16 2223  WBC 9.0  RBC 3.98*  HGB 12.9*  HCT 37.0*  MCV 93.0  MCH 32.4  MCHC 34.9  RDW 11.9  PLT 207    Cardiac Enzymes Recent Labs Lab 05/08/16 2223 05/09/16 0253  TROPONINI <0.03 0.03*   No results for input(s): TROPIPOC in the last 168 hours.   BNP Recent Labs Lab 05/09/16 0253  BNP 149.5*     DDimer No results for input(s): DDIMER in the last 168 hours.   Radiology    Dg Chest Port 1 View  Result Date: 05/08/2016 CLINICAL DATA:  Ventricular tachycardia, acute onset. Defibrillator fired. Gurgling and vomiting. Assess for aspiration. Initial encounter. EXAM: PORTABLE CHEST 1 VIEW COMPARISON:  Chest radiograph performed 01/19/2016 FINDINGS: The lungs are well-aerated. Vascular congestion is noted. Increased interstitial markings may reflect mild transient interstitial edema. There is no evidence of pleural effusion or pneumothorax. The cardiomediastinal silhouette is borderline enlarged. The patient is status post median sternotomy. A pacemaker is noted overlying the left chest wall, with leads ending overlying the right atrium, right ventricle and coronary sinus.  No acute osseous abnormalities are seen. An external pacing pad is noted. IMPRESSION: Vascular congestion and borderline cardiomegaly. Increased interstitial markings may reflect mild transient interstitial edema. No radiographic evidence for aspiration at this time. Electronically Signed   By: Roanna Raider M.D.   On: 05/08/2016 22:20    Cardiac Studies   NA  Patient Profile     61 yo with history of CAD s/p CABG, ischemic cardiomyopathy/chronic systolic CHF, [employer] CRT-D, hemochromatosis, h/o VT on sotalol presented after having appropriate ICD discharge for VT.   Assessment & Plan    ICD FIRING:   Appropriate shock.  Started on amiodarone.   Will need to consider VT ablation.  Consulted EP.   CAD:  Patent LIMA-LAD, patent SVG-OM2 (small graft to small vessel), patent SVG-PDA (small graft to  small vessel) October 2017.   Enzymes negative.  No plans for ischemia work up.   ISCHEMIC CARDIOMYOPATHY WITH CHRONIC SYSTOLIC AND DIASTOLIC HF:    LV lead off secondary to high thresholds.   Some vascular congestion on exam.  Given IV Lasix.   BNP is only very mildly abnormal.  Change back to PO today.   DYSLIPIDEMIA:  He does not tolerate statins.  Refer to Lipid Clinic as an out patient for possible clinical trial or open label PCSK9.   CKD:  Creat stable   Signed, Rollene Rotunda, MD  05/09/2016, 7:56 AM

## 2016-05-09 NOTE — Progress Notes (Signed)
CRITICAL VALUE ALERT  Critical value received:  Troponin 0.03  Date of notification:  05/09/2016  Time of notification:  0410  Critical value read back:Yes.    Nurse who received alert:  Eunice Blaseracy Kamdon Reisig RN  MD notified (1st page):  Marca AnconaDalton McLean   Time of first page:  [phone]  Responding MD:  Marca Anconaalton McLean  Time MD responded:  [phone]

## 2016-05-10 ENCOUNTER — Encounter (HOSPITAL_COMMUNITY): Payer: Self-pay | Admitting: General Practice

## 2016-05-10 DIAGNOSIS — E7801 Familial hypercholesterolemia: Secondary | ICD-10-CM

## 2016-05-10 LAB — GLUCOSE, CAPILLARY
GLUCOSE-CAPILLARY: 181 mg/dL — AB (ref 65–99)
GLUCOSE-CAPILLARY: 245 mg/dL — AB (ref 65–99)
Glucose-Capillary: 197 mg/dL — ABNORMAL HIGH (ref 65–99)
Glucose-Capillary: 228 mg/dL — ABNORMAL HIGH (ref 65–99)

## 2016-05-10 MED ORDER — OFF THE BEAT BOOK
Freq: Once | Status: DC
  Filled 2016-05-10: qty 1

## 2016-05-10 MED ORDER — AMIODARONE HCL 200 MG PO TABS
400.0000 mg | ORAL_TABLET | Freq: Three times a day (TID) | ORAL | Status: DC
Start: 2016-05-10 — End: 2016-05-12
  Administered 2016-05-10 – 2016-05-12 (×7): 400 mg via ORAL
  Filled 2016-05-10 (×7): qty 2

## 2016-05-10 NOTE — Progress Notes (Signed)
Monitor shows 5 beat run of V-Tach. Pt asymptomatic. VS obtained. Will continue to monitor.

## 2016-05-10 NOTE — Progress Notes (Signed)
Patient has had a 6 beat run of VTACH while eating supper, patient is asymptomatic. No S/S of distress noted or complaints voiced at this time. Patient's wife remains at bedside. Call bell is in reach.

## 2016-05-10 NOTE — Progress Notes (Signed)
CARDIAC REHAB PHASE I   PRE:  Rate/Rhythm: 74 pacing    BP: sitting 121/54    SaO2:   MODE:  Ambulation: 1000 ft   POST:  Rate/Rhythm: 82 pacing    BP: sitting 110/57     SaO2:   Tolerated well, no c/o, no VT noted, just pacing. Encouraged more walking.  [id number]   Harriet MassonRandi Kristan Abagael Kramm CES, ACSM 05/10/2016 9:29 AM

## 2016-05-10 NOTE — Progress Notes (Signed)
Patient Name: DETAVIOUS Weaver      SUBJECTIVE: without complaints this am Tolerating amio Sotalol discontinued  Past Medical History:  Diagnosis Date  . AICD (automatic cardioverter/defibrillator) present   . Anemia   . CHF (congestive heart failure) (HCC)   . Coronary artery disease   . Hemochromatosis   . Hyperlipidemia   . Ischemic cardiomyopathy    EF 36%; prior anterior MI, s/p CABG x 3; s/p cath Feb 2013 showing grafts to be patent with severe LV dysfunction  . PAF (paroxysmal atrial fibrillation) (HCC)   . RBBB (right bundle branch block)   . Rotator cuff injury   . Sleep apnea    no cpap  . Type II diabetes mellitus (HCC)   . Ventricular tachycardia, 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    Scheduled Meds:  Scheduled Meds: . amiodarone  400 mg Oral TID  . aspirin EC  81 mg Oral Daily  . carvedilol  25 mg Oral BID WC  . fenofibrate  160 mg Oral Daily  . furosemide  10 mg Oral BID  . heparin  5,000 Units Subcutaneous Q8H  . insulin aspart  0-15 Units Subcutaneous TID WC  . insulin aspart  0-5 Units Subcutaneous QHS  . lisinopril  5 mg Oral Daily  . magnesium oxide  400 mg Oral Daily  . off the beat book   Does not apply Once  . omega-3 acid ethyl esters  1 g Oral BID  . potassium chloride  40 mEq Oral Daily  . spironolactone  25 mg Oral Daily   Continuous Infusions: acetaminophen, nitroGLYCERIN, ondansetron (ZOFRAN) IV    PHYSICAL EXAM Vitals:   05/10/16 0306 05/10/16 0605 05/10/16 0809 05/10/16 1021  BP: (!) 108/47  (!) 108/53 (!) 110/57  Pulse: 65  74   Resp: 13 14 16    Temp: 97.9 F (36.6 C)  97.6 F (36.4 C)   TempSrc: Oral  Oral   SpO2: 97%  97%   Weight: 205 lb 0.4 oz (93 kg)     Height:         TELEMETRY:  personally reviewed Nonsustained VT present      Well developed and nourished in no acute distress HENT normal Neck supple with JVP-flat Carotids brisk and full without bruits Clear Regular rate and rhythm, no murmurs or  gallops Abd-soft with active BS without hepatomegaly No Clubbing cyanosis edema Skin-warm and dry A & Oriented  Grossly normal sensory and motor function    ECG NA  Intake/Output Summary (Last 24 hours) at 05/10/16 1049 Last data filed at 05/10/16 0809  Gross per 24 hour  Intake             1380 ml  Output                0 ml  Net             1380 ml    LABS: Basic Metabolic Panel:  Recent Labs Lab 05/08/16 2223 05/09/16 0748  NA 135 137  K 3.7 4.5  CL 101 103  CO2 20* 23  GLUCOSE 230* 180*  BUN 24* 20  CREATININE 1.47* 1.40*  CALCIUM 9.5 9.7  MG 1.7  --    Cardiac Enzymes:  Recent Labs  05/09/16 0253 05/09/16 0748 05/09/16 1429  TROPONINI 0.03* 0.03* <0.03   CBC:  Recent Labs Lab 05/08/16 2223 05/09/16 0748  WBC 9.0 9.2  NEUTROABS 6.4  --   HGB 12.9*  14.2  HCT 37.0* 40.2  MCV 93.0 92.2  PLT 207 237   PROTIME: No results for input(s): LABPROT, INR in the last 72 hours. Liver Function Tests:  Recent Labs  05/08/16 2223  AST 25  ALT 23  ALKPHOS 34*  BILITOT 0.5  PROT 6.3*  ALBUMIN 3.8   No results for input(s): LIPASE, AMYLASE in the last 72 hours. BNP: BNP (last 3 results)  Recent Labs  05/09/16 0253  BNP 149.5*    ProBNP (last 3 results) No results for input(s): PROBNP in the last 8760 hours.  D-Dimer: No results for input(s): DDIMER in the last 72 hours.     ASSESSMENT AND PLAN:  Principal Problem:   VT (ventricular tachycardia) (HCC) Active Problems:   Ischemic cardiomyopathy   Hyperlipidemia   PAF (paroxysmal atrial fibrillation) (HCC)   Chronic systolic heart failure (HCC)  Will continue amiodarone Dr Fawn KirkJA has broached ablation  Continue current meds;  EF >40% so no indication for Entresto  Will recheck lipids, now with PSCK9 better options  Signed, Sherryl MangesSteven Klein MD  05/10/2016

## 2016-05-10 NOTE — Progress Notes (Signed)
Inpatient Diabetes Program Recommendations  AACE/ADA: New Consensus Statement on Inpatient Glycemic Control (2015)  Target Ranges:  Prepandial:   less than 140 mg/dL      Peak postprandial:   less than 180 mg/dL (1-2 hours)      Critically ill patients:  140 - 180 mg/dL   Lab Results  Component Value Date   GLUCAP 228 (H) 05/10/2016   HGBA1C 6.0 10/06/2010    Review of Glycemic Control Results for [name], Gyasi A (MRN [id number]) as of 05/10/2016 14:13  Ref. Range 05/09/2016 15:36 05/09/2016 16:48 05/09/2016 21:34 05/10/2016 07:02 05/10/2016 11:56  Glucose-Capillary Latest Ref Range: 65 - 99 mg/dL 086277 (H) 578201 (H) 469293 (H) 197 (H) 228 (H)   Inpatient Diabetes Program Recommendations:    Please consider Lantus 10 units daily and A1c.  Thank you, Billy FischerJudy E. Justo Hengel, RN, MSN, CDE Inpatient Glycemic Control Team Team Pager [phone] (8am-5pm) 05/10/2016 2:24 PM

## 2016-05-11 LAB — GLUCOSE, CAPILLARY
GLUCOSE-CAPILLARY: 175 mg/dL — AB (ref 65–99)
GLUCOSE-CAPILLARY: 261 mg/dL — AB (ref 65–99)
GLUCOSE-CAPILLARY: 169 mg/dL — AB (ref 65–99)
GLUCOSE-CAPILLARY: 188 mg/dL — AB (ref 65–99)

## 2016-05-11 LAB — LIPID PANEL
CHOL/HDL RATIO: 6.6 ratio
CHOLESTEROL: 259 mg/dL — AB (ref 0–200)
HDL: 39 mg/dL — AB (ref >40)
LDL Cholesterol: 183 mg/dL — ABNORMAL HIGH (ref 0–99)
Triglycerides: 185 mg/dL — ABNORMAL HIGH (ref <150)
VLDL: 37 mg/dL (ref 0–40)

## 2016-05-11 MED ORDER — METFORMIN HCL 500 MG PO TABS
1000.0000 mg | ORAL_TABLET | Freq: Two times a day (BID) | ORAL | Status: DC
  Administered 2016-05-11 – 2016-05-12 (×2): 1000 mg via ORAL
  Filled 2016-05-11 (×2): qty 2

## 2016-05-11 NOTE — Progress Notes (Addendum)
Patient Name: [name]      SUBJECTIVE: without complaints this am Tolerating amio Sotalol discontinued  Past Medical History:  Diagnosis Date  . AICD (automatic cardioverter/defibrillator) present   . Anemia   . Arthritis    "mild in my joints & knuckles" (05/10/2016)  . CHF (congestive heart failure) (HCC)   . Coronary artery disease   . Hemochromatosis   . Hyperlipidemia   . Ischemic cardiomyopathy    EF 36%; prior anterior MI, s/p CABG x 3; s/p cath Feb 2013 showing grafts to be patent with severe LV dysfunction  . Myocardial infarction 2006  . PAF (paroxysmal atrial fibrillation) (HCC)   . Pneumonia 2000  . RBBB (right bundle branch block)   . Sleep apnea    "can't tolerate mask" (05/10/2016)  . Type II diabetes mellitus (HCC)   . Ventricular tachycardia, 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    Scheduled Meds:  Scheduled Meds: . amiodarone  400 mg Oral TID  . aspirin EC  81 mg Oral Daily  . carvedilol  25 mg Oral BID WC  . fenofibrate  160 mg Oral Daily  . furosemide  10 mg Oral BID  . heparin  5,000 Units Subcutaneous Q8H  . insulin aspart  0-15 Units Subcutaneous TID WC  . insulin aspart  0-5 Units Subcutaneous QHS  . lisinopril  5 mg Oral Daily  . magnesium oxide  400 mg Oral Daily  . off the beat book   Does not apply Once  . omega-3 acid ethyl esters  1 g Oral BID  . potassium chloride  40 mEq Oral Daily  . spironolactone  25 mg Oral Daily   Continuous Infusions: acetaminophen, nitroGLYCERIN, ondansetron (ZOFRAN) IV    PHYSICAL EXAM Vitals:   05/10/16 1923 05/10/16 2355 05/11/16 0618 05/11/16 0820  BP: (!) 109/49 121/69 121/63 (!) 120/56  Pulse: 71 70 64 75  Resp: 15 17 14    Temp: 98.4 F (36.9 C) 98 F (36.7 C) 98.3 F (36.8 C) 98.3 F (36.8 C)  TempSrc: Oral Oral Oral Oral  SpO2: 92% 96% 92% 96%  Weight:   210 lb 15.7 oz (95.7 kg)   Height:         TELEMETRY:  Reviewed by Dr. Graciela Husbands, Nonsustained VT present      Well  developed and nourished in no acute distress HENT normal Neck supple with JVP-flat Carotids brisk and full without bruits Clear Regular rate and rhythm, no murmurs or gallops Abd-soft with active BS without hepatomegaly No Clubbing cyanosis edema Skin-warm and dry A & Oriented  Grossly normal sensory and motor function      Intake/Output Summary (Last 24 hours) at 05/11/16 1029 Last data filed at 05/11/16 0821  Gross per 24 hour  Intake             1040 ml  Output              200 ml  Net              840 ml    LABS: Basic Metabolic Panel:  Recent Labs Lab 05/08/16 2223 05/09/16 0748  NA 135 137  K 3.7 4.5  CL 101 103  CO2 20* 23  GLUCOSE 230* 180*  BUN 24* 20  CREATININE 1.47* 1.40*  CALCIUM 9.5 9.7  MG 1.7  --    Cardiac Enzymes:  Recent Labs  05/09/16 0253 05/09/16 0748 05/09/16 1429  TROPONINI 0.03* 0.03* <0.03  CBC:  Recent Labs Lab 05/08/16 2223 05/09/16 0748  WBC 9.0 9.2  NEUTROABS 6.4  --   HGB 12.9* 14.2  HCT 37.0* 40.2  MCV 93.0 92.2  PLT 207 237   Liver Function Tests:  Recent Labs  05/08/16 2223  AST 25  ALT 23  ALKPHOS 34*  BILITOT 0.5  PROT 6.3*  ALBUMIN 3.8   BNP: BNP (last 3 results)  Recent Labs  05/09/16 0253  BNP 149.5*     ASSESSMENT AND PLAN: Patient was seen and examined by Dr. Graciela HusbandsKlein  1.  Hemodynamically unstable VT The patient presents with recurrent hemodynamically unstable VT with appropriate ICD therapy.  His VT started below his detection (340msec) which is likely cause of syncope He had ischemic evaluation with cath 01/2016, no need to repeat PO amiodarone load in progress ICD reprogrammed to VT zone of 164bpm with more ATP to try to terminate VT without ICD shocks Keep K >3.9, Mg >1.8 No driving x6 months - pt aware Could consider VT ablation in the future electively, Dr. Johney FrameAllred has mentioned this to the patient, follow up out patient  2.  CAD s/p CABG No recent ischemic symptoms Cath  01/2016 with patent grafts  3.  Chronic systolic heart failure Euvolemic on exam Continue current therapy  LV lead off per Dr Graciela HusbandsKlein with underlying RBBB EF 40%, no need for Entresto  4. HLD May benefit from PSCK9 Statin/zetia intolerance RPH consult out patient  5. Unclear hx of PAFib listed for him     Records reviewedm unclear when this was observed     Patient has denied knowledge of AF and has not been on a/c     Continue to monitor for via his device   Signed, Sheilah PigeonRenee Lynn Ursuy, PA-C  05/11/2016  Seen and examined agree with above Will plan discharge tomorrow on amio 400 bid x 2 weeks 400 daily x 2 weeks Then 2 day Will discuss with JA re timing for Ablation

## 2016-05-11 NOTE — Progress Notes (Signed)
Pt has walked x3 today. No CR needs. Will sign off. Ethelda ChickKristan Oneisha Ammons CES, ACSM 11:22 AM 05/11/2016

## 2016-05-12 LAB — GLUCOSE, CAPILLARY: GLUCOSE-CAPILLARY: 175 mg/dL — AB (ref 65–99)

## 2016-05-12 MED ORDER — AMIODARONE HCL 400 MG PO TABS: 400.0000 mg | ORAL_TABLET | Freq: Two times a day (BID) | ORAL | 3 refills | Status: DC

## 2016-05-12 MED ORDER — AMIODARONE HCL 400 MG PO TABS: ORAL_TABLET | ORAL | 3 refills | Status: DC

## 2016-05-12 NOTE — Progress Notes (Signed)
Patient Name: [name]      SUBJECTIVE: without complaints this am Tolerating amio Anxious to leave     Past Medical History:  Diagnosis Date  . AICD (automatic cardioverter/defibrillator) present   . Anemia   . Arthritis    "mild in my joints & knuckles" (05/10/2016)  . CHF (congestive heart failure) (HCC)   . Coronary artery disease   . Hemochromatosis   . Hyperlipidemia   . Ischemic cardiomyopathy    EF 36%; prior anterior MI, s/p CABG x 3; s/p cath Feb 2013 showing grafts to be patent with severe LV dysfunction  . Myocardial infarction 2006  . PAF (paroxysmal atrial fibrillation) (HCC)   . Pneumonia 2000  . RBBB (right bundle branch block)   . Sleep apnea    "can't tolerate mask" (05/10/2016)  . Type II diabetes mellitus (HCC)   . Ventricular tachycardia, 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    Scheduled Meds:  Scheduled Meds: . amiodarone  400 mg Oral TID  . aspirin EC  81 mg Oral Daily  . carvedilol  25 mg Oral BID WC  . fenofibrate  160 mg Oral Daily  . furosemide  10 mg Oral BID  . heparin  5,000 Units Subcutaneous Q8H  . insulin aspart  0-15 Units Subcutaneous TID WC  . insulin aspart  0-5 Units Subcutaneous QHS  . lisinopril  5 mg Oral Daily  . magnesium oxide  400 mg Oral Daily  . metFORMIN  1,000 mg Oral BID WC  . off the beat book   Does not apply Once  . omega-3 acid ethyl esters  1 g Oral BID  . potassium chloride  40 mEq Oral Daily  . spironolactone  25 mg Oral Daily   Continuous Infusions: acetaminophen, nitroGLYCERIN, ondansetron (ZOFRAN) IV    PHYSICAL EXAM Vitals:   05/11/16 1457 05/11/16 1909 05/12/16 0428 05/12/16 0818  BP: 112/69 (!) 109/57 (!) 104/57 (!) 103/54  Pulse: 74 64 66 69  Resp:  18 15 17   Temp: 98.4 F (36.9 C) 98 F (36.7 C) 97.9 F (36.6 C) 98.6 F (37 C)  TempSrc: Oral Oral Axillary Oral  SpO2: 98% 94% 94% 95%  Weight:   211 lb 10.3 oz (96 kg)   Height:         Telemetry Personally reviewed  No VT  overnight   occ PVCs     Well developed and nourished in no acute distress HENT normal Neck supple with JVP-flat Carotids brisk and full without bruits Clear Regular rate and rhythm, no murmurs or gallops Abd-soft with active BS without hepatomegaly No Clubbing cyanosis edema Skin-warm and dry A & Oriented  Grossly normal sensory and motor function      Intake/Output Summary (Last 24 hours) at 05/12/16 1032 Last data filed at 05/12/16 0819  Gross per 24 hour  Intake              800 ml  Output                0 ml  Net              800 ml    LABS: Basic Metabolic Panel:  Recent Labs Lab 05/08/16 2223 05/09/16 0748  NA 135 137  K 3.7 4.5  CL 101 103  CO2 20* 23  GLUCOSE 230* 180*  BUN 24* 20  CREATININE 1.47* 1.40*  CALCIUM 9.5 9.7  MG 1.7  --  Cardiac Enzymes:  Recent Labs  05/09/16 1429  TROPONINI <0.03   CBC:  Recent Labs Lab 05/08/16 2223 05/09/16 0748  WBC 9.0 9.2  NEUTROABS 6.4  --   HGB 12.9* 14.2  HCT 37.0* 40.2  MCV 93.0 92.2  PLT 207 237   Liver Function Tests: No results for input(s): AST, ALT, ALKPHOS, BILITOT, PROT, ALBUMIN in the last 72 hours. BNP: BNP (last 3 results)  Recent Labs  05/09/16 0253  BNP 149.5*   LDL 183   ASSESSMENT AND PLAN:    1.  Hemodynamically unstable VT The patient presents with recurrent hemodynamically unstable VT with appropriate ICD therapy.  His VT started below his detection ( ) which is likely cause of syncope   ICD reprogrammed to VT zone of 164bpm with more ATP to try to terminate VT without ICD shocks Keep K >3.9, Mg >1.8 No driving x6 months - pt aware   2.  CAD s/p CABG No recent ischemic symptoms Cath 01/2016 with patent grafts  3.  Chronic systolic heart failure Euvolemic on exam Continue current therapy  LV lead off per Dr Graciela Husbands with underlying RBBB EF 40%, no need for Entresto  4. HLD-very high LDL  May benefit from PSCK9 Statin/zetia intolerance RPH consult  out patient  5. Unclear hx of PAFib listed for him     Records reviewedm unclear when this was observed     Patient has denied knowledge of AF and has not been on a/c     Continue to monitor for via his device     Will plan discharge tomorrow on amio 400 bid x 2 weeks 400 daily x 2 weeks Then 200 daily  Needs Lipid clinic appt for PSCK9  Will reach out to [facility] next week about consultation for VT ablation  Will need to decrease VT detect to about 400 msec I think with adjunctive amio

## 2016-05-12 NOTE — Discharge Instructions (Signed)
No driving x6 months °

## 2016-05-12 NOTE — Care Management Important Message (Signed)
Important Message  Patient Details  Name: [name] A [place] MRN: [id number] Date of Birth: [birth year]   Medicare Important Message Given:  Yes    Dorena BodoIris Valeria Boza 05/12/2016, 11:56 AM

## 2016-05-12 NOTE — Discharge Summary (Signed)
ELECTROPHYSIOLOGY PROCEDURE DISCHARGE SUMMARY    Patient ID: [name],  MRN: [id number], DOB/AGE: 1955/07/15 61 y.o.  Admit date: 05/08/2016 Discharge date: 05/12/2016  Primary Care Physician: Minda Meo, MD Primary Cardiologist: [place] Electrophysiologist: Graciela Husbands  Primary Discharge Diagnosis:  Principal Problem:   VT (ventricular tachycardia) (HCC) Active Problems:   Ischemic cardiomyopathy   Hyperlipidemia   PAF (paroxysmal atrial fibrillation) (HCC)   Chronic systolic heart failure (HCC)   Allergies  Allergen Reactions  . Statins Other (See Comments)    Myalgias   . Zetia [Ezetimibe] Other (See Comments)    myalgias   Brief HPI/Hospital Course:  [name] is a 61 y.o. male with a past medical history significant for CAD, ischemic cardiomyopathy, OSA, diabetes, chronic systolic heart failure, and prior appropriate therapy for ventricular tachycardia. He presented in August of 2017 with inappropriate ICD therapy 2/2 ungrounded pool.  He then was admitted 01/2016 with appropriate therapy and was placed on Sotalol. He has done well since then with no recurrent ICD shocks until yesterday. He was having dinner at the [facility] when he stood up to pay for his dinner (hot dog) and developed a sensation of warmth and extreme dizziness.  He does not remember anything else until he was in the ambulance with EMS.  He receive a short period of bystander CPR.  He was transferred to [facility] and device interrogation demonstrated appropriate therapy for VT that started below the VT zone and accelerated into the VF zone with appropriate ICD shock terminating VF and restoring SR.  It is difficult to know how long he was in VT for below his zone, but it was long enough for him to have syncope and require bystander CPR.  He has been admitted and started on IV amiodarone. Labs were grossly unremarkable. He underwent ischemic evaluation after appropriate therapy in October of 2017  with patent grafts and no targets for revascularization. He was converted to po amiodarone and monitored on telemetry. His device was reprogrammed to allow for treatment of slower VT's. He will be seen in follow up in the office. He has been seen by Dr Graciela Husbands and considered stable for discharge to home.  No driving x6 months. Will refer to lipid clinic for consideration of PSCK9  Physical Exam: Vitals:   05/11/16 1457 05/11/16 1909 05/12/16 0428 05/12/16 0818  BP: 112/69 (!) 109/57 (!) 104/57 (!) 103/54  Pulse: 74 64 66 69  Resp:  18 15 17   Temp: 98.4 F (36.9 C) 98 F (36.7 C) 97.9 F (36.6 C) 98.6 F (37 C)  TempSrc: Oral Oral Axillary Oral  SpO2: 98% 94% 94% 95%  Weight:   211 lb 10.3 oz (96 kg)   Height:         Labs:   Lab Results  Component Value Date   WBC 9.2 05/09/2016   HGB 14.2 05/09/2016   HCT 40.2 05/09/2016   MCV 92.2 05/09/2016   PLT 237 05/09/2016     Recent Labs Lab 05/08/16 2223 05/09/16 0748  NA 135 137  K 3.7 4.5  CL 101 103  CO2 20* 23  BUN 24* 20  CREATININE 1.47* 1.40*  CALCIUM 9.5 9.7  PROT 6.3*  --   BILITOT 0.5  --   ALKPHOS 34*  --   ALT 23  --   AST 25  --   GLUCOSE 230* 180*     Discharge Medications:  Current Discharge Medication List    START  taking these medications   Details  amiodarone (PACERONE) 400 MG tablet Take 400mg  twice daily for 2 weeks, then 400mg  daily for 2 weeks, then 200mg  daily Qty: 120 tablet, Refills: 3      CONTINUE these medications which have NOT CHANGED   Details  aspirin 81 MG tablet Take 81 mg by mouth daily.   Associated Diagnoses: Chronic systolic heart failure (HCC); Ischemic cardiomyopathy; PAF (paroxysmal atrial fibrillation) (HCC); CAD (coronary artery disease); HTN (hypertension)    carvedilol (COREG) 25 MG tablet Take 1 tablet (25 mg total) by mouth 2 (two) times daily with a meal. Qty: 135 tablet, Refills: 2    fenofibrate 160 MG tablet TAKE 1 TABLET BY MOUTH DAILY. Qty: 90 tablet,  Refills: 2    furosemide (LASIX) 20 MG tablet TAKE 1/2 TABLET BY MOUTH DAILY. Qty: 30 tablet, Refills: 11    lisinopril (PRINIVIL,ZESTRIL) 10 MG tablet Take 0.5 tablets (5 mg total) by mouth daily. Qty: 30 tablet, Refills: 11    magnesium oxide (MAG-OX) 400 (241.3 Mg) MG tablet Take 1 tablet (400 mg total) by mouth daily. Qty: 30 tablet, Refills: 11    metFORMIN (GLUCOPHAGE) 500 MG tablet Take 1,000 mg by mouth 2 (two) times daily with a meal.     NITROSTAT 0.4 MG SL tablet PLACE 1 TABLET UNDER THE TONGUE EVERY 5 MINUTES AS NEEDED FOR CHEST PAIN FOR UP TO 3 DOSES. Qty: 25 tablet, Refills: 3    Omega-3 Fatty Acids (RA FISH OIL) 1400 MG CPDR Take 1,400 mg by mouth 2 (two) times daily.    spironolactone (ALDACTONE) 25 MG tablet Take 1 tablet (25 mg total) by mouth daily. Qty: 30 tablet, Refills: 11    ONETOUCH VERIO test strip 1 each by Other route as needed for other.  Refills: 6      STOP taking these medications     sotalol (BETAPACE) 120 MG tablet         Disposition: Pt is being discharged home today in good condition. Discharge Instructions    Diet - low sodium heart healthy    Complete by:  As directed    Increase activity slowly    Complete by:  As directed      Follow-up Information    Gypsy BalsamAmber Haidy Kackley, NP Follow up on 06/02/2016.   Specialty:  Cardiology Why:  at [facility] Contact information: [address] [place] [address] [phone]           Duration of Discharge Encounter: Greater than 30 minutes including physician time.  Signed, Gypsy BalsamAmber Yalda Herd, NP 05/12/2016 10:51 AM

## 2016-06-01 NOTE — Progress Notes (Signed)
Patient ID: [name]                 DOB: Sep 27, 1955                    MRN: [id number]     HPI: Colton NineLeslie A Pucci is a 61 y.o. male patient of Dr Graciela HusbandsKlein referred to lipid clinic by Gypsy BalsamAmber Seiler, NP. PMH is significant for CAD s/p MI and CABG x3 vessels, CHF with LVEF 40-45%, PAF, ICD with hx of VT, DM2, and HLD. Pt has a history of statin intolerance and presents to lipid clinic for further management.  Pt reports severe joint pain in his hips, knees, shoulders, and elbows with statin use. He has tried Crestor 5mg  3x per week and Lipitor 40mg  daily. Symptoms would present about 1 week after starting therapy and would take about a week to resolve after drug discontinuation. He is currently adherent to his fenofibrate and fish oil.  Pt and his wife are primarily concerned with his VT and state that cholesterol management is currently at the bottom of their priority list.  Current Medications: fenofibrate 160mg  daily, fish oil 1400mg  BID Intolerances: Crestor 5mg  3x per week and 10mg  daily, Lipitor 40mg  daily, Zetia 10mg  daily Risk Factors: CAD s/p MI and CABG x3 vessels LDL goal: 70mg /dL  Diet: Sandwiches (PB and J, ham and cheese), chicken soup. Watches his sugar since he has DM  Exercise: Minimal  Family History: Not on file - pt was adopted.  Social History: The patient  reports that he quit smoking about 10 years ago. His smoking use included Cigarettes. He has a 60.00 pack-year smoking history. He has never used smokeless tobacco. He reports that he drinks about 1.8 oz of alcohol per week . He reports that he does not use drugs.  Labs: 05/11/16: TC 259, TG 185, HDL 39, LDL 183 (fenofibrate and fish oil)  Past Medical History:  Diagnosis Date  . AICD (automatic cardioverter/defibrillator) present   . Anemia   . Arthritis    "mild in my joints & knuckles" (05/10/2016)  . CHF (congestive heart failure) (HCC)   . Coronary artery disease   . Hemochromatosis   . Hyperlipidemia     . Ischemic cardiomyopathy    EF 36%; prior anterior MI, s/p CABG x 3; s/p cath Feb 2013 showing grafts to be patent with severe LV dysfunction  . Myocardial infarction 2006  . PAF (paroxysmal atrial fibrillation) (HCC)   . Pneumonia 2000  . RBBB (right bundle branch block)   . Sleep apnea    "can't tolerate mask" (05/10/2016)  . Type II diabetes mellitus (HCC)   . Ventricular tachycardia, 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    Current Outpatient Prescriptions on File Prior to Visit  Medication Sig Dispense Refill  . amiodarone (PACERONE) 400 MG tablet Take 400mg  twice daily for 2 weeks, then 400mg  daily for 2 weeks, then 200mg  daily 120 tablet 3  . aspirin 81 MG tablet Take 81 mg by mouth daily.    . carvedilol (COREG) 25 MG tablet Take 1 tablet (25 mg total) by mouth 2 (two) times daily with a meal. 135 tablet 2  . fenofibrate 160 MG tablet TAKE 1 TABLET BY MOUTH DAILY. 90 tablet 2  . furosemide (LASIX) 20 MG tablet TAKE 1/2 TABLET BY MOUTH DAILY. 30 tablet 11  . lisinopril (PRINIVIL,ZESTRIL) 10 MG tablet Take 0.5 tablets (5 mg total) by mouth daily. 30 tablet 11  . magnesium oxide (MAG-OX) 400 (  241.3 Mg) MG tablet Take 1 tablet (400 mg total) by mouth daily. 30 tablet 11  . metFORMIN (GLUCOPHAGE) 500 MG tablet Take 1,000 mg by mouth 2 (two) times daily with a meal.     . NITROSTAT 0.4 MG SL tablet PLACE 1 TABLET UNDER THE TONGUE EVERY 5 MINUTES AS NEEDED FOR CHEST PAIN FOR UP TO 3 DOSES. 25 tablet 3  . Omega-3 Fatty Acids (RA FISH OIL) 1400 MG CPDR Take 1,400 mg by mouth 2 (two) times daily.    Letta Pate VERIO test strip 1 each by Other route as needed for other.   6  . spironolactone (ALDACTONE) 25 MG tablet Take 1 tablet (25 mg total) by mouth daily. 30 tablet 11   No current facility-administered medications on file prior to visit.     Allergies  Allergen Reactions  . Statins Other (See Comments)    Myalgias   . Zetia [Ezetimibe] Other (See Comments)    myalgias     Assessment/Plan:  1. Hyperlipidemia - LDL 183 above goal 70mg /dL given hx of CAD. Pt is intolerant to multiple statins. Discussed PCSK9i vs clinical trials with pt and his wife. He is not currently interested in pursuing PCSK9i given recent VT with ICD shock and upcoming ablation. Will f/u in 6 months to see if pt would like to pursue lipid lowering therapy at that time.   Megan E. Supple, PharmD, CPP, BCACP [facility] [address] [place], [place] [address] Phone: [phone]; Fax: [phone] 06/02/2016 10:33 AM

## 2016-06-01 NOTE — Progress Notes (Signed)
Electrophysiology Office Note Date: 06/02/2016  ID:  Colton MilroyLeslie A Weaver, DOB 05-02-1955, MRN [id number]  PCP: [name],RICHARD A, MD Primary Cardiologist: [place] Electrophysiologist: Graciela HusbandsKlein  CC: VT follow-up  Colton NineLeslie A Weaver is a 61 y.o. male seen today for Dr Graciela HusbandsKlein.  He was admitted last month [facility] when he stood up to pay for his dinner (hot dog) and developed a sensation of warmth and extreme dizziness. He does not remember anything else until he was in the ambulance with EMS. He receive a short period of bystander CPR. [facility] and device interrogation demonstrated appropriate therapy for VT that started below the VT zone and accelerated into the VF zone with appropriate ICD shock terminating VF and restoring SR. It is difficult to know how long he was in VT for below his zone, but it was long enough for him to have syncope and require bystander CPR. He has been admitted and started on IV amiodarone. Labs were grossly unremarkable. He underwent ischemic evaluation after appropriate therapy in October of 2017 with patent grafts and no targets for revascularization. He was converted to po amiodarone and monitored on telemetry. His device was reprogrammed to allow for treatment of slower VT's.  Since discharge, the patient reports significant anxiety about repeat ICD shocks and has been limiting activity. He is waking up with night sweats, has hand tremors, feels that his vision has changed, and overall has little energy.  He denies chest pain, dyspnea, PND, orthopnea, nausea, vomiting, syncope, edema, weight gain, or early satiety.  He has not had ICD shocks.   Device History: MDT CRTD implanted 2013 for ICM, CHF; gen change 2016  History of appropriate therapy: yes History of AAD therapy: yes - sotalol started 01/2016; amiodarone started 04/2016   Past Medical History:  Diagnosis Date  . AICD (automatic cardioverter/defibrillator)  present   . Anemia   . Arthritis    "mild in my joints & knuckles" (05/10/2016)  . CHF (congestive heart failure) (HCC)   . Coronary artery disease   . Hemochromatosis   . Hyperlipidemia   . Ischemic cardiomyopathy    EF 36%; prior anterior MI, s/p CABG x 3; s/p cath Feb 2013 showing grafts to be patent with severe LV dysfunction  . Myocardial infarction 2006  . PAF (paroxysmal atrial fibrillation) (HCC)   . Pneumonia 2000  . RBBB (right bundle branch block)   . Sleep apnea    "can't tolerate mask" (05/10/2016)  . Type II diabetes mellitus (HCC)   . Ventricular tachycardia, 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   Past Surgical History:  Procedure Laterality Date  . BI-VENTRICULAR IMPLANTABLE CARDIOVERTER DEFIBRILLATOR N/A 08/21/2011   Procedure: BI-VENTRICULAR IMPLANTABLE CARDIOVERTER DEFIBRILLATOR  (CRT-D);  Surgeon: Duke SalviaSteven C Klein, MD;  [facility];  Service: Cardiovascular;  Laterality: N/A;  . BIV ICD GENERTAOR CHANGE OUT N/A 06/12/2014   Procedure: BIV ICD GENERTAOR CHANGE OUT;  Surgeon: Duke SalviaSteven C Klein, MD;  [facility];  Service: Cardiovascular;  Laterality: N/A;  . CARDIAC CATHETERIZATION N/A 01/19/2016   Procedure: Left Heart Cath and Cors/Grafts Angiography;  Surgeon: Peter M [place]azilandJordan, MD;  [facility];  Service: Cardiovascular;  Laterality: N/A;  . CORONARY ARTERY BYPASS GRAFT  2006   Emergent CABG x 3 with LIMA to LAD, SVG to OM, SVG to distal RCA per Dr. Tyrone SageGerhardt  . INGUINAL HERNIA REPAIR  02/09/2012   Procedure: HERNIA REPAIR INGUINAL ADULT;  Surgeon: Fayrene FearingJames  Charlsie Quest, MD;  Location: [facility] OR;  Service: General;  Laterality: Right;  . INGUINAL HERNIA REPAIR Bilateral [id number]  . INSERTION OF MESH  02/09/2012   Procedure: INSERTION OF MESH;  Surgeon: Cherylynn Ridges, MD;  Location[facility] OR;  Service: General;  Laterality: Right;  . LEFT HEART CATHETERIZATION WITH CORONARY/GRAFT ANGIOGRAM N/A 05/25/2011   Procedure: LEFT HEART CATHETERIZATION WITH Isabel Caprice;  Surgeon: Peter M [place], MD;  Location: [facility] CATH LAB;  Service: Cardiovascular;  Laterality: N/A;  . LIVER BIOPSY  1990s  . SHOULDER OPEN ROTATOR CUFF REPAIR Bilateral ?2000/~ 2007   left; right   . WRIST FRACTURE SURGERY Left 06/2005   "plates and screws and cadavaer bone"    Current Outpatient Prescriptions  Medication Sig Dispense Refill  . amiodarone (PACERONE) 400 MG tablet Take 400mg  twice daily for 2 weeks, then 400mg  daily for 2 weeks, then 200mg  daily 120 tablet 3  . aspirin 81 MG tablet Take 81 mg by mouth daily.    . carvedilol (COREG) 25 MG tablet Take 1 tablet (25 mg total) by mouth 2 (two) times daily with a meal. 135 tablet 2  . fenofibrate 160 MG tablet TAKE 1 TABLET BY MOUTH DAILY. 90 tablet 2  . furosemide (LASIX) 20 MG tablet TAKE 1/2 TABLET BY MOUTH DAILY. 30 tablet 11  . lisinopril (PRINIVIL,ZESTRIL) 10 MG tablet Take 0.5 tablets (5 mg total) by mouth daily. 30 tablet 11  . magnesium oxide (MAG-OX) 400 (241.3 Mg) MG tablet Take 1 tablet (400 mg total) by mouth daily. 30 tablet 11  . metFORMIN (GLUCOPHAGE) 500 MG tablet Take 1,000 mg by mouth 2 (two) times daily with a meal.     . NITROSTAT 0.4 MG SL tablet PLACE 1 TABLET UNDER THE TONGUE EVERY 5 MINUTES AS NEEDED FOR CHEST PAIN FOR UP TO 3 DOSES. 25 tablet 3  . Omega-3 Fatty Acids (RA FISH OIL) 1400 MG CPDR Take 1,400 mg by mouth 2 (two) times daily.    Letta Pate VERIO test strip 1 each by Other route as needed for other.   6  . spironolactone (ALDACTONE) 25 MG tablet Take 1 tablet (25 mg total) by mouth daily. 30 tablet 11   No current facility-administered medications for this visit.     Allergies:   Statins and Zetia [ezetimibe]   Social History: Social History   Social History  . Marital status: Married    Spouse name: N/A  . Number of children: 1  . Years of education: N/A   Occupational History  . telephone service    Social History Main Topics  . Smoking status: Former Smoker     Packs/day: 2.00    Years: 30.00    Types: Cigarettes    Quit date: 03/11/2005  . Smokeless tobacco: Never Used  . Alcohol use 1.8 oz/week    3 Cans of beer per week  . Drug use: No  . Sexual activity: Not Currently   Other Topics Concern  . Not on file   Social History Narrative  . No narrative on file    Family History: Family History  Problem Relation Age of Onset  . Adopted: Yes    Review of Systems: All other systems reviewed and are otherwise negative except as noted above.   Physical Exam: VS:  BP (!) 112/58   Pulse 64   Ht 6' (1.829 m)   Wt 208 lb (94.3 kg)   SpO2 97%   BMI 28.21 kg/m  ,  BMI Body mass index is 28.21 kg/m.  GEN- The patient is well appearing, alert and oriented x 3 today.   HEENT: normocephalic, atraumatic; sclera clear, conjunctiva pink; hearing intact; oropharynx clear; neck supple  Lungs- Clear to ausculation bilaterally, normal work of breathing.  No wheezes, rales, rhonchi Heart- Regular rate and rhythm  GI- soft, non-tender, non-distended, bowel sounds present  Extremities- no clubbing, cyanosis, or edema  MS- no significant deformity or atrophy Skin- warm and dry, no rash or lesion; ICD pocket well healed Psych- euthymic mood, full affect Neuro- strength and sensation are intact  ICD interrogation- reviewed in detail today,  See PACEART report  EKG:  EKG is not ordered today.  Recent Labs: 05/08/2016: ALT 23; Magnesium 1.7 05/09/2016: B Natriuretic Peptide 149.5; Hemoglobin 14.2 06/02/2016: BUN 23; Creatinine, Ser 1.75; Platelets 213; Potassium 4.8; Sodium 139; TSH 1.880   Wt Readings from Last 3 Encounters:  06/02/16 208 lb (94.3 kg)  05/12/16 211 lb 10.3 oz (96 kg)  03/10/16 209 lb (94.8 kg)     Other studies Reviewed: Additional studies/ records that were reviewed today include: hospital records   Assessment and Plan: 1.  Hemodynamically unstable VT He continues to have significant burden of NSVT hovering around  detection.  I have lowered VT zone to 140bpm today. He has significant anxiety about recurrent VT/ICD shocks and is not tolerating amiodarone. He has previously failed sotalol.  Treatment options reviewed with the patient and his wife today.   Therapeutic strategies for ventricular tachycardia including medicine and ablation were discussed in detail with the patient today. Risk, benefits, and alternatives to EP study and radiofrequency ablation were also discussed in detail today. These risks include but are not limited to stroke, bleeding, vascular damage, tamponade, perforation, damage to the heart and other structures, AV block requiring pacemaker, worsening renal function, and death. The patient understands these risk and wishes to proceed.  Will schedule for next Thursday.  Stop amiodarone on Monday. Stop Coreg Wednesday morning.  Keep K >3.9, Mg >1.8 No driving x6 months - pt aware LFT's normal recently. TSH today   2.  CAD s/p CABG No recent ischemic symptoms Cath 01/2016 with patent grafts  3.  Chronic systolic heart failure Euvolemic on exam Continue current therapy  LV lead off per Dr Graciela Husbands with underlying RBBB  Current medicines are reviewed at length with the patient today.   The patient does not have concerns regarding his medicines.  The following changes were made today:  none  Labs/ tests ordered today include: TSH, pre-procedure labs  Orders Placed This Encounter  Procedures  . Basic metabolic panel  . TSH  . CBC  . CUP [facility] DEVICE CHECK    Randolm Idol MD 06/02/2016 5:01 PM  [facility] [address] [place] [address] [phone] (office) [phone] (fax

## 2016-06-02 ENCOUNTER — Encounter: Payer: Self-pay | Admitting: Nurse Practitioner

## 2016-06-02 ENCOUNTER — Ambulatory Visit (INDEPENDENT_AMBULATORY_CARE_PROVIDER_SITE_OTHER): Payer: Medicare Other | Admitting: Pharmacist

## 2016-06-02 ENCOUNTER — Encounter: Payer: Self-pay | Admitting: *Deleted

## 2016-06-02 ENCOUNTER — Ambulatory Visit (INDEPENDENT_AMBULATORY_CARE_PROVIDER_SITE_OTHER): Payer: Medicare Other | Admitting: Internal Medicine

## 2016-06-02 VITALS — BP 112/58 | HR 64 | Ht 72.0 in | Wt 208.0 lb

## 2016-06-02 DIAGNOSIS — I25810 Atherosclerosis of coronary artery bypass graft(s) without angina pectoris: Secondary | ICD-10-CM

## 2016-06-02 DIAGNOSIS — I472 Ventricular tachycardia, unspecified: Secondary | ICD-10-CM

## 2016-06-02 DIAGNOSIS — I5022 Chronic systolic (congestive) heart failure: Secondary | ICD-10-CM

## 2016-06-02 DIAGNOSIS — E784 Other hyperlipidemia: Secondary | ICD-10-CM

## 2016-06-02 DIAGNOSIS — I255 Ischemic cardiomyopathy: Secondary | ICD-10-CM | POA: Diagnosis not present

## 2016-06-02 DIAGNOSIS — E7849 Other hyperlipidemia: Secondary | ICD-10-CM

## 2016-06-02 LAB — CUP PACEART INCLINIC DEVICE CHECK
Date Time Interrogation Session: 20180223090925
Implantable Lead Implant Date: 20130513
Implantable Lead Location: 753858
Implantable Lead Location: 753859
Implantable Lead Location: 753860
Implantable Lead Serial Number: 316784
Implantable Pulse Generator Implant Date: 20160304
MDC IDC LEAD IMPLANT DT: 20130513
MDC IDC LEAD IMPLANT DT: 20130513

## 2016-06-02 LAB — CBC
HEMOGLOBIN: 12 g/dL — AB (ref 13.0–17.7)
Hematocrit: 34.8 % — ABNORMAL LOW (ref 37.5–51.0)
MCH: 32.4 pg (ref 26.6–33.0)
MCHC: 34.5 g/dL (ref 31.5–35.7)
MCV: 94 fL (ref 79–97)
Platelets: 213 10*3/uL (ref 150–379)
RBC: 3.7 x10E6/uL — AB (ref 4.14–5.80)
RDW: 11.7 % — ABNORMAL LOW (ref 12.3–15.4)
WBC: 5.5 10*3/uL (ref 3.4–10.8)

## 2016-06-02 LAB — BASIC METABOLIC PANEL
BUN/Creatinine Ratio: 13 (ref 10–24)
BUN: 23 mg/dL (ref 8–27)
CALCIUM: 9.3 mg/dL (ref 8.6–10.2)
CHLORIDE: 100 mmol/L (ref 96–106)
CO2: 21 mmol/L (ref 18–29)
Creatinine, Ser: 1.75 mg/dL — ABNORMAL HIGH (ref 0.76–1.27)
GFR calc Af Amer: 48 mL/min/{1.73_m2} — ABNORMAL LOW (ref >59)
GFR calc non Af Amer: 41 mL/min/{1.73_m2} — ABNORMAL LOW (ref >59)
GLUCOSE: 154 mg/dL — AB (ref 65–99)
Potassium: 4.8 mmol/L (ref 3.5–5.2)
Sodium: 139 mmol/L (ref 134–144)

## 2016-06-02 LAB — TSH: TSH: 1.88 u[IU]/mL (ref 0.450–4.500)

## 2016-06-02 NOTE — Patient Instructions (Signed)
Follow up in 6 months to discuss injectable cholesterol medicine (either Praluent or Repatha)  Call Megan in lipid clinic with any concerns about your cholesterol [phone]

## 2016-06-02 NOTE — Patient Instructions (Addendum)
Medication Instructions:   LAST DOSE  OF AMIODARONE 400 MG  ON   MONDAY2/26/2018  LAST DOSE OF COREG  25 MG ON TUESDAY  06/06/2016  If you need a refill on your cardiac medications before your next appointment, please call your pharmacy.  Labwork: CBC BMET AND TSH    Testing/Procedures: SEE LETTER FOR VT  ABALATION   Follow-Up:  4 WEEKS AFTER 06/08/2016 WITH AMBER SEILER    Any Other Special Instructions Will Be Listed Below (If Applicable).

## 2016-06-08 ENCOUNTER — Ambulatory Visit (HOSPITAL_COMMUNITY): Payer: Medicare Other | Admitting: Certified Registered Nurse Anesthetist

## 2016-06-08 ENCOUNTER — Ambulatory Visit (HOSPITAL_COMMUNITY)
Admission: RE | Admit: 2016-06-08 | Discharge: 2016-06-09 | Disposition: A | Discharge status: Home / Self Care | Payer: Medicare Other | Source: Ambulatory Visit | Attending: Internal Medicine | Admitting: Internal Medicine

## 2016-06-08 ENCOUNTER — Encounter (HOSPITAL_COMMUNITY): Payer: Self-pay | Admitting: Certified Registered Nurse Anesthetist

## 2016-06-08 ENCOUNTER — Encounter (HOSPITAL_COMMUNITY)
Admission: RE | Disposition: A | Discharge status: Home / Self Care | Payer: Self-pay | Source: Ambulatory Visit | Attending: Internal Medicine

## 2016-06-08 DIAGNOSIS — F419 Anxiety disorder, unspecified: Secondary | ICD-10-CM | POA: Insufficient documentation

## 2016-06-08 DIAGNOSIS — I480 Paroxysmal atrial fibrillation: Secondary | ICD-10-CM | POA: Insufficient documentation

## 2016-06-08 DIAGNOSIS — I472 Ventricular tachycardia, unspecified: Secondary | ICD-10-CM

## 2016-06-08 DIAGNOSIS — M1990 Unspecified osteoarthritis, unspecified site: Secondary | ICD-10-CM | POA: Diagnosis not present

## 2016-06-08 DIAGNOSIS — E785 Hyperlipidemia, unspecified: Secondary | ICD-10-CM | POA: Diagnosis not present

## 2016-06-08 DIAGNOSIS — I255 Ischemic cardiomyopathy: Secondary | ICD-10-CM | POA: Insufficient documentation

## 2016-06-08 DIAGNOSIS — I4510 Unspecified right bundle-branch block: Secondary | ICD-10-CM | POA: Insufficient documentation

## 2016-06-08 DIAGNOSIS — I110 Hypertensive heart disease with heart failure: Secondary | ICD-10-CM | POA: Diagnosis not present

## 2016-06-08 DIAGNOSIS — I2510 Atherosclerotic heart disease of native coronary artery without angina pectoris: Secondary | ICD-10-CM | POA: Insufficient documentation

## 2016-06-08 DIAGNOSIS — Z95810 Presence of automatic (implantable) cardiac defibrillator: Secondary | ICD-10-CM | POA: Diagnosis not present

## 2016-06-08 DIAGNOSIS — I5022 Chronic systolic (congestive) heart failure: Secondary | ICD-10-CM | POA: Diagnosis not present

## 2016-06-08 DIAGNOSIS — Z7984 Long term (current) use of oral hypoglycemic drugs: Secondary | ICD-10-CM | POA: Diagnosis not present

## 2016-06-08 DIAGNOSIS — Z951 Presence of aortocoronary bypass graft: Secondary | ICD-10-CM | POA: Insufficient documentation

## 2016-06-08 DIAGNOSIS — Z87891 Personal history of nicotine dependence: Secondary | ICD-10-CM | POA: Diagnosis not present

## 2016-06-08 DIAGNOSIS — E119 Type 2 diabetes mellitus without complications: Secondary | ICD-10-CM | POA: Insufficient documentation

## 2016-06-08 DIAGNOSIS — G4730 Sleep apnea, unspecified: Secondary | ICD-10-CM | POA: Insufficient documentation

## 2016-06-08 DIAGNOSIS — I252 Old myocardial infarction: Secondary | ICD-10-CM | POA: Insufficient documentation

## 2016-06-08 DIAGNOSIS — Z7982 Long term (current) use of aspirin: Secondary | ICD-10-CM | POA: Insufficient documentation

## 2016-06-08 HISTORY — PX: V TACH ABLATION: EP1227

## 2016-06-08 HISTORY — PX: VENTRICULAR ABLATION SURGERY: SHX835

## 2016-06-08 LAB — POCT ACTIVATED CLOTTING TIME
ACTIVATED CLOTTING TIME: 153 s
ACTIVATED CLOTTING TIME: 213 s
ACTIVATED CLOTTING TIME: 246 s
Activated Clotting Time: 180 seconds
Activated Clotting Time: 252 seconds
Activated Clotting Time: 175 seconds
Activated Clotting Time: 186 seconds

## 2016-06-08 LAB — GLUCOSE, CAPILLARY
GLUCOSE-CAPILLARY: 121 mg/dL — AB (ref 65–99)
Glucose-Capillary: 97 mg/dL (ref 65–99)
Glucose-Capillary: 99 mg/dL (ref 65–99)
Glucose-Capillary: 97 mg/dL (ref 65–99)

## 2016-06-08 SURGERY — V TACH ABLATION
Anesthesia: General

## 2016-06-08 MED ORDER — HEPARIN SODIUM (PORCINE) 1000 UNIT/ML IJ SOLN
INTRAMUSCULAR | Status: DC | PRN
  Administered 2016-06-08: 4000 [IU] via INTRAVENOUS
  Administered 2016-06-08 (×3): 5000 [IU] via INTRAVENOUS
  Administered 2016-06-08: 3000 [IU] via INTRAVENOUS

## 2016-06-08 MED ORDER — ONDANSETRON HCL 4 MG/2ML IJ SOLN: 4.0000 mg | Freq: Once | INTRAMUSCULAR | Status: DC | PRN

## 2016-06-08 MED ORDER — HYDROMORPHONE HCL 1 MG/ML IJ SOLN: 0.2500 mg | INTRAMUSCULAR | Status: DC | PRN

## 2016-06-08 MED ORDER — ISOPROTERENOL HCL 0.2 MG/ML IJ SOLN
INTRAVENOUS | Status: DC | PRN
  Administered 2016-06-08: 2 ug/min via INTRAVENOUS

## 2016-06-08 MED ORDER — ONDANSETRON HCL 4 MG/2ML IJ SOLN: 4.0000 mg | Freq: Four times a day (QID) | INTRAMUSCULAR | Status: DC | PRN

## 2016-06-08 MED ORDER — PHENYLEPHRINE 40 MCG/ML (10ML) SYRINGE FOR IV PUSH (FOR BLOOD PRESSURE SUPPORT)
PREFILLED_SYRINGE | INTRAVENOUS | Status: DC | PRN
  Administered 2016-06-08: 40 ug via INTRAVENOUS
  Administered 2016-06-08 (×3): 80 ug via INTRAVENOUS

## 2016-06-08 MED ORDER — LISINOPRIL 5 MG PO TABS
5.0000 mg | ORAL_TABLET | Freq: Every day | ORAL | Status: DC
  Administered 2016-06-09: 5 mg via ORAL
  Filled 2016-06-08: qty 1

## 2016-06-08 MED ORDER — SODIUM CHLORIDE 0.9% FLUSH
3.0000 mL | Freq: Two times a day (BID) | INTRAVENOUS | Status: DC
  Administered 2016-06-08 – 2016-06-09 (×2): 3 mL via INTRAVENOUS

## 2016-06-08 MED ORDER — PROTAMINE SULFATE 10 MG/ML IV SOLN
INTRAVENOUS | Status: DC | PRN
  Administered 2016-06-08: 20 mg via INTRAVENOUS

## 2016-06-08 MED ORDER — SODIUM CHLORIDE 0.9 % IV SOLN
INTRAVENOUS | Status: DC | PRN
  Administered 2016-06-08: 11:00:00 via INTRAVENOUS

## 2016-06-08 MED ORDER — BUPIVACAINE HCL (PF) 0.25 % IJ SOLN
INTRAMUSCULAR | Status: AC
  Filled 2016-06-08: qty 30

## 2016-06-08 MED ORDER — METFORMIN HCL 500 MG PO TABS
1000.0000 mg | ORAL_TABLET | Freq: Two times a day (BID) | ORAL | Status: DC
  Administered 2016-06-08 – 2016-06-09 (×2): 1000 mg via ORAL
  Filled 2016-06-08 (×2): qty 2

## 2016-06-08 MED ORDER — SODIUM CHLORIDE 0.9% FLUSH: 3.0000 mL | INTRAVENOUS | Status: DC | PRN

## 2016-06-08 MED ORDER — ASPIRIN EC 81 MG PO TBEC
81.0000 mg | DELAYED_RELEASE_TABLET | Freq: Every day | ORAL | Status: DC
  Administered 2016-06-08 – 2016-06-09 (×2): 81 mg via ORAL
  Filled 2016-06-08 (×2): qty 1

## 2016-06-08 MED ORDER — CARVEDILOL 25 MG PO TABS
25.0000 mg | ORAL_TABLET | Freq: Two times a day (BID) | ORAL | Status: DC
  Administered 2016-06-09: 25 mg via ORAL
  Filled 2016-06-08: qty 1

## 2016-06-08 MED ORDER — ROCURONIUM BROMIDE 100 MG/10ML IV SOLN
INTRAVENOUS | Status: DC | PRN
  Administered 2016-06-08: 10 mg via INTRAVENOUS
  Administered 2016-06-08: 50 mg via INTRAVENOUS

## 2016-06-08 MED ORDER — MEPERIDINE HCL 25 MG/ML IJ SOLN: 6.2500 mg | INTRAMUSCULAR | Status: DC | PRN

## 2016-06-08 MED ORDER — SUGAMMADEX SODIUM 200 MG/2ML IV SOLN
INTRAVENOUS | Status: DC | PRN
  Administered 2016-06-08: 100 mg via INTRAVENOUS

## 2016-06-08 MED ORDER — ACETAMINOPHEN 325 MG PO TABS
650.0000 mg | ORAL_TABLET | ORAL | Status: DC | PRN
  Administered 2016-06-08: 650 mg via ORAL
  Filled 2016-06-08: qty 2

## 2016-06-08 MED ORDER — MIDAZOLAM HCL 2 MG/2ML IJ SOLN
INTRAMUSCULAR | Status: DC | PRN
  Administered 2016-06-08 (×2): 1 mg via INTRAVENOUS

## 2016-06-08 MED ORDER — PHENYLEPHRINE HCL 10 MG/ML IJ SOLN
INTRAMUSCULAR | Status: DC | PRN
  Administered 2016-06-08: 25 ug/min via INTRAVENOUS

## 2016-06-08 MED ORDER — LIDOCAINE HCL (CARDIAC) 20 MG/ML IV SOLN
INTRAVENOUS | Status: DC | PRN
Start: 2016-06-08 — End: 2016-06-08
  Administered 2016-06-08: 100 mg via INTRATRACHEAL

## 2016-06-08 MED ORDER — MAGNESIUM OXIDE 400 (241.3 MG) MG PO TABS
400.0000 mg | ORAL_TABLET | Freq: Every day | ORAL | Status: DC
  Administered 2016-06-09: 400 mg via ORAL
  Filled 2016-06-08: qty 1

## 2016-06-08 MED ORDER — BUPIVACAINE HCL (PF) 0.25 % IJ SOLN
INTRAMUSCULAR | Status: DC | PRN
  Administered 2016-06-08: 30 mL

## 2016-06-08 MED ORDER — SPIRONOLACTONE 25 MG PO TABS
25.0000 mg | ORAL_TABLET | Freq: Every day | ORAL | Status: DC
  Administered 2016-06-09: 25 mg via ORAL
  Filled 2016-06-08: qty 1

## 2016-06-08 MED ORDER — FENTANYL CITRATE (PF) 100 MCG/2ML IJ SOLN
INTRAMUSCULAR | Status: DC | PRN
  Administered 2016-06-08 (×2): 50 ug via INTRAVENOUS

## 2016-06-08 MED ORDER — PROPOFOL 10 MG/ML IV BOLUS
INTRAVENOUS | Status: DC | PRN
  Administered 2016-06-08: 150 mg via INTRAVENOUS

## 2016-06-08 MED ORDER — SODIUM CHLORIDE 0.9 % IV SOLN: 250.0000 mL | INTRAVENOUS | Status: DC | PRN

## 2016-06-08 MED ORDER — HYDROCODONE-ACETAMINOPHEN 5-325 MG PO TABS: 1.0000 | ORAL_TABLET | ORAL | Status: DC | PRN

## 2016-06-08 MED ORDER — LACTATED RINGERS IV SOLN
INTRAVENOUS | Status: DC | PRN
  Administered 2016-06-08: 14:00:00 via INTRAVENOUS

## 2016-06-08 MED ORDER — FUROSEMIDE 40 MG PO TABS
40.0000 mg | ORAL_TABLET | Freq: Every day | ORAL | Status: DC
  Administered 2016-06-09: 40 mg via ORAL
  Filled 2016-06-08: qty 1

## 2016-06-08 SURGICAL SUPPLY — 13 items
BAG SNAP BAND KOVER 36X36 (MISCELLANEOUS) ×3 IMPLANT
BLANKET WARM UNDERBOD FULL ACC (MISCELLANEOUS) ×2 IMPLANT
CATH JOSEPH QUAD ALLRED 6F REP (CATHETERS) ×2 IMPLANT
CATH SMTCH THERMOCOOL SF DF (CATHETERS) ×2 IMPLANT
CATH WEBSTER BI DIR CS D-F CRV (CATHETERS) ×2 IMPLANT
PACK EP LATEX FREE (CUSTOM PROCEDURE TRAY) ×3
PACK EP LF (CUSTOM PROCEDURE TRAY) ×1 IMPLANT
PAD DEFIB LIFELINK (PAD) ×3 IMPLANT
PATCH CARTO3 (PAD) ×2 IMPLANT
SHEATH PINNACLE 6F 10CM (SHEATH) ×2 IMPLANT
SHEATH PINNACLE 7F 10CM (SHEATH) ×2 IMPLANT
SHEATH PINNACLE 8F 10CM (SHEATH) ×4 IMPLANT
TUBING SMART ABLATE COOLFLOW (TUBING) ×4 IMPLANT

## 2016-06-08 NOTE — Discharge Summary (Signed)
ELECTROPHYSIOLOGY PROCEDURE DISCHARGE SUMMARY    Patient ID: Colton NineLeslie A Roseman,  MRN: [id number], DOB/AGE: 61-Jan-1957 61 y.o.  Admit date: 06/08/2016 Discharge date: 06/09/2016  Primary Care Physician: [place]az[place] Primary Cardiologist: Graciela HusbandsKlein  Primary Discharge Diagnosis:  1.  Ventricular tachycardia s/p ablation this admission  Secondary Discharge Diagnoses: 1.  CAD 2.  Chronic systolic heart failure 3.  Paroxysmal atrial fibrillation 4.  RBBB 5.  Diabetes  Allergies  Allergen Reactions  . Statins Other (See Comments)    Myalgias   . Zetia [Ezetimibe] Other (See Comments)    myalgias    Procedures This Admission:  1.  Electrophysiology study and radiofrequency catheter ablation on 06/08/16 by Dr Johney FrameAllred. This study demonstrated sinus rhythm upon presentation; inducible ventricular tachycardia with a cycle length of 360 milliseconds, which was felt to be the clinical tachycardia. This was a right bundle-branch superior axis VT with a Q-wave in V6. This tachycardia was mapped (pace mapping) and successfully ablated along the inferoseptal portion of the left ventricle; alarge inferior LV scar wasdemonstrated with voltage mapping.  Only a small septal scar was observed.  There was no apical or anterior scar demonstrated by voltage mapping today; extensive substrate modification with radiofrequency ablation performed along the inferior and inferoseptal portions of the LV scar; no early apparent complications.  Brief HPI:  [name] is a 61 y.o. male with a past medical history as outlined above. He has had recurrent appropriate therapy for VT despite AAD use.  He was previously on Sotalol and most recently has had recurrent VT on amiodarone. He has significant anxiety about recurrent VT and is not tolerating amiodarone 2/2 side effects.  He was seen in the office and risks, benefits to EPS/ablation were reviewed with the patient who wished to proceed  Hospital Course:  The  patient was admitted and underwent EPS/ablation on 06/08/16 by Dr Johney FrameAllred with details as outlined above. He was monitored on telemetry overnight was reviewed by myself demonstrated SR/V paced. Groin was without complication.  He was seen by  Dr Johney FrameAllred and considered stable for discharge to home. He will be seen in the office in 4 weeks for follow up.    We had a long discussion about AAD therapy.  At this time, he understands risks of amiodarone but would prefer to keep taking this medicine.  I have reduced dose to amiodarone 200mg  daily.  He will follow-up with me in 4 weeks.  I would advise reducing to 100mg  daily at that time if no further VT with plans to ultimately stop amiodarone in 3 months.    Physical Exam: Vitals:   06/08/16 1650 06/08/16 1655 06/08/16 1948 06/09/16 0456  BP: (!) 93/49 (!) 96/51 (!) 106/58 107/60  Pulse: 60 62 68 71  Resp: 15 14 14 11   Temp:   99 F (37.2 C) 98.7 F (37.1 C)  TempSrc:   Oral Oral  SpO2: 90% 92% 94% 97%  Weight:    204 lb 5.9 oz (92.7 kg)  Height:        GEN- The patient is well appearing, alert and oriented x 3 today.   HEENT: normocephalic, atraumatic; sclera clear, conjunctiva pink; hearing intact; oropharynx clear; neck supple  Lungs- Clear to ausculation bilaterally, normal work of breathing.  No wheezes, rales, rhonchi Heart- Regular rate and rhythm, no murmurs, rubs or gallops  GI- soft, non-tender, non-distended, bowel sounds present  Extremities- no clubbing, cyanosis, or edema  MS- no significant deformity or atrophy  Skin- warm and dry, no rash or lesion Psych- euthymic mood, full affect Neuro- strength and sensation are intact    Labs:   Lab Results  Component Value Date   WBC 5.5 06/02/2016   HGB 14.2 05/09/2016   HCT 34.8 (L) 06/02/2016   MCV 94 06/02/2016   PLT 213 06/02/2016     Recent Labs Lab 06/09/16 0453  NA 138  K 4.3  CL 107  CO2 23  BUN 17  CREATININE 1.67*  CALCIUM 8.8*  GLUCOSE 130*     Discharge  Medications:  Current Discharge Medication List    CONTINUE these medications which have CHANGED   Details  amiodarone (PACERONE) 400 MG tablet Take 0.5 tablets (200 mg total) by mouth daily. Take 200 mg daily Qty: 120 tablet, Refills: 3      CONTINUE these medications which have NOT CHANGED   Details  aspirin 81 MG tablet Take 81 mg by mouth daily.   Associated Diagnoses: Chronic systolic heart failure (HCC); Ischemic cardiomyopathy; PAF (paroxysmal atrial fibrillation) (HCC); CAD (coronary artery disease); HTN (hypertension)    carvedilol (COREG) 25 MG tablet Take 1 tablet (25 mg total) by mouth 2 (two) times daily with a meal. Qty: 135 tablet, Refills: 2    fenofibrate 160 MG tablet TAKE 1 TABLET BY MOUTH DAILY. Qty: 90 tablet, Refills: 2    furosemide (LASIX) 20 MG tablet TAKE 1/2 TABLET BY MOUTH DAILY. Qty: 30 tablet, Refills: 11    lisinopril (PRINIVIL,ZESTRIL) 10 MG tablet Take 0.5 tablets (5 mg total) by mouth daily. Qty: 30 tablet, Refills: 11    magnesium oxide (MAG-OX) 400 (241.3 Mg) MG tablet Take 1 tablet (400 mg total) by mouth daily. Qty: 30 tablet, Refills: 11    metFORMIN (GLUCOPHAGE) 500 MG tablet Take 1,000 mg by mouth 2 (two) times daily with a meal.     NITROSTAT 0.4 MG SL tablet PLACE 1 TABLET UNDER THE TONGUE EVERY 5 MINUTES AS NEEDED FOR CHEST PAIN FOR UP TO 3 DOSES. Qty: 25 tablet, Refills: 3    Omega-3 Fatty Acids (RA FISH OIL) 1400 MG CPDR Take 1,400 mg by mouth 2 (two) times daily.    spironolactone (ALDACTONE) 25 MG tablet Take 1 tablet (25 mg total) by mouth daily. Qty: 30 tablet, Refills: 11    ONETOUCH VERIO test strip 1 each by Other route as needed for other.  Refills: 6        Disposition: Pt is being discharged home today in good condition.  Follow-up Information    Gypsy Balsam, NP Follow up on 07/12/2016.   Specialty:  Cardiology Why:  at 11:20AM  Contact information: [address] [place] [address] [phone]             Duration of Discharge Encounter: Greater than 30 minutes including physician time.  Randolm Idol MD 06/09/2016 11:06 AM

## 2016-06-08 NOTE — H&P (View-Only) (Signed)
Electrophysiology Office Note Date: 06/02/2016  ID:  [name], DOB 05-02-1955, MRN [id number]  PCP: [name],Colton A, MD Primary Cardiologist: [name] Electrophysiologist: [name]  CC: VT follow-up  Colton NineLeslie A Weaver is a 61 y.o. male seen today for Dr [name].  He was admitted last month [facility] when he stood up to pay for his dinner (hot dog) and developed a sensation of warmth and extreme dizziness. He does not remember anything else until he was in the ambulance with EMS. He receive a short period of bystander CPR. [facility] and device interrogation demonstrated appropriate therapy for VT that started below the VT zone and accelerated into the VF zone with appropriate ICD shock terminating VF and restoring SR. It is difficult to know how long he was in VT for below his zone, but it was long enough for him to have syncope and require bystander CPR. He has been admitted and started on IV amiodarone. Labs were grossly unremarkable. He underwent ischemic evaluation after appropriate therapy in October of 2017 with patent grafts and no targets for revascularization. He was converted to po amiodarone and monitored on telemetry. His device was reprogrammed to allow for treatment of slower VT's.  Since discharge, the patient reports significant anxiety about repeat ICD shocks and has been limiting activity. He is waking up with night sweats, has hand tremors, feels that his vision has changed, and overall has little energy.  He denies chest pain, dyspnea, PND, orthopnea, nausea, vomiting, syncope, edema, weight gain, or early satiety.  He has not had ICD shocks.   Device History: MDT CRTD implanted 2013 for ICM, CHF; gen change 2016  History of appropriate therapy: yes History of AAD therapy: yes - sotalol started 01/2016; amiodarone started 04/2016   Past Medical History:  Diagnosis Date  . AICD (automatic cardioverter/defibrillator)  present   . Anemia   . Arthritis    "mild in my joints & knuckles" (05/10/2016)  . CHF (congestive heart failure) (HCC)   . Coronary artery disease   . Hemochromatosis   . Hyperlipidemia   . Ischemic cardiomyopathy    EF 36%; prior anterior MI, s/p CABG x 3; s/p cath Feb 2013 showing grafts to be patent with severe LV dysfunction  . Myocardial infarction 2006  . PAF (paroxysmal atrial fibrillation) (HCC)   . Pneumonia 2000  . RBBB (right bundle branch block)   . Sleep apnea    "can't tolerate mask" (05/10/2016)  . Type II diabetes mellitus (HCC)   . Ventricular tachycardia, 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   Past Surgical History:  Procedure Laterality Date  . BI-VENTRICULAR IMPLANTABLE CARDIOVERTER DEFIBRILLATOR N/A 08/21/2011   Procedure: BI-VENTRICULAR IMPLANTABLE CARDIOVERTER DEFIBRILLATOR  (CRT-D);  Surgeon: Colton SalviaSteven C Klein, MD;  [facility];  Service: Cardiovascular;  Laterality: N/A;  . BIV ICD GENERTAOR CHANGE OUT N/A 06/12/2014   Procedure: BIV ICD GENERTAOR CHANGE OUT;  Surgeon: Colton SalviaSteven C Klein, MD;  [facility];  Service: Cardiovascular;  Laterality: N/A;  . CARDIAC CATHETERIZATION N/A 01/19/2016   Procedure: Left Heart Cath and Cors/Grafts Angiography;  Surgeon: Colton M Swazil[place]Jordan, MD;  [facility];  Service: Cardiovascular;  Laterality: N/A;  . CORONARY ARTERY BYPASS GRAFT  2006   Emergent CABG x 3 with LIMA to LAD, SVG to OM, SVG to distal RCA per Dr. Tyrone Weaver  . INGUINAL HERNIA REPAIR  02/09/2012   Procedure: HERNIA REPAIR INGUINAL ADULT;  Surgeon: Colton FearingJames  Charlsie Quest, MD;  Location: [facility] OR;  Service: General;  Laterality: Right;  . INGUINAL HERNIA REPAIR Bilateral [id number]  . INSERTION OF MESH  02/09/2012   Procedure: INSERTION OF MESH;  Surgeon: Colton Ridges, MD;  Location: [facility] OR;  Service: General;  Laterality: Right;  . LEFT HEART CATHETERIZATION WITH CORONARY/GRAFT ANGIOGRAM N/A 05/25/2011   Procedure: LEFT HEART CATHETERIZATION WITH Isabel Caprice;  Surgeon: Colton M [place], MD;  Location: [facility] CATH LAB;  Service: Cardiovascular;  Laterality: N/A;  . LIVER BIOPSY  1990s  . SHOULDER OPEN ROTATOR CUFF REPAIR Bilateral ?2000/~ 2007   left; right   . WRIST FRACTURE SURGERY Left 06/2005   "plates and screws and cadavaer bone"    Current Outpatient Prescriptions  Medication Sig Dispense Refill  . amiodarone (PACERONE) 400 MG tablet Take 400mg  twice daily for 2 weeks, then 400mg  daily for 2 weeks, then 200mg  daily 120 tablet 3  . aspirin 81 MG tablet Take 81 mg by mouth daily.    . carvedilol (COREG) 25 MG tablet Take 1 tablet (25 mg total) by mouth 2 (two) times daily with a meal. 135 tablet 2  . fenofibrate 160 MG tablet TAKE 1 TABLET BY MOUTH DAILY. 90 tablet 2  . furosemide (LASIX) 20 MG tablet TAKE 1/2 TABLET BY MOUTH DAILY. 30 tablet 11  . lisinopril (PRINIVIL,ZESTRIL) 10 MG tablet Take 0.5 tablets (5 mg total) by mouth daily. 30 tablet 11  . magnesium oxide (MAG-OX) 400 (241.3 Mg) MG tablet Take 1 tablet (400 mg total) by mouth daily. 30 tablet 11  . metFORMIN (GLUCOPHAGE) 500 MG tablet Take 1,000 mg by mouth 2 (two) times daily with a meal.     . NITROSTAT 0.4 MG SL tablet PLACE 1 TABLET UNDER THE TONGUE EVERY 5 MINUTES AS NEEDED FOR CHEST PAIN FOR UP TO 3 DOSES. 25 tablet 3  . Omega-3 Fatty Acids (RA FISH OIL) 1400 MG CPDR Take 1,400 mg by mouth 2 (two) times daily.    Letta Pate VERIO test strip 1 each by Other route as needed for other.   6  . spironolactone (ALDACTONE) 25 MG tablet Take 1 tablet (25 mg total) by mouth daily. 30 tablet 11   No current facility-administered medications for this visit.     Allergies:   Statins and Zetia [ezetimibe]   Social History: Social History   Social History  . Marital status: Married    Spouse name: N/A  . Number of children: 1  . Years of education: N/A   Occupational History  . telephone service    Social History Main Topics  . Smoking status: Former Smoker     Packs/day: 2.00    Years: 30.00    Types: Cigarettes    Quit date: 03/11/2005  . Smokeless tobacco: Never Used  . Alcohol use 1.8 oz/week    3 Cans of beer per week  . Drug use: No  . Sexual activity: Not Currently   Other Topics Concern  . Not on file   Social History Narrative  . No narrative on file    Family History: Family History  Problem Relation Age of Onset  . Adopted: Yes    Review of Systems: All other systems reviewed and are otherwise negative except as noted above.   Physical Exam: VS:  BP (!) 112/58   Pulse 64   Ht 6' (1.829 m)   Wt 208 lb (94.3 kg)   SpO2 97%   BMI 28.21 kg/m  ,  BMI Body mass index is 28.21 kg/m.  GEN- The patient is well appearing, alert and oriented x 3 today.   HEENT: normocephalic, atraumatic; sclera clear, conjunctiva pink; hearing intact; oropharynx clear; neck supple  Lungs- Clear to ausculation bilaterally, normal work of breathing.  No wheezes, rales, rhonchi Heart- Regular rate and rhythm  GI- soft, non-tender, non-distended, bowel sounds present  Extremities- no clubbing, cyanosis, or edema  MS- no significant deformity or atrophy Skin- warm and dry, no rash or lesion; ICD pocket well healed Psych- euthymic mood, full affect Neuro- strength and sensation are intact  ICD interrogation- reviewed in detail today,  See PACEART report  EKG:  EKG is not ordered today.  Recent Labs: 05/08/2016: ALT 23; Magnesium 1.7 05/09/2016: B Natriuretic Peptide 149.5; Hemoglobin 14.2 06/02/2016: BUN 23; Creatinine, Ser 1.75; Platelets 213; Potassium 4.8; Sodium 139; TSH 1.880   Wt Readings from Last 3 Encounters:  06/02/16 208 lb (94.3 kg)  05/12/16 211 lb 10.3 oz (96 kg)  03/10/16 209 lb (94.8 kg)     Other studies Reviewed: Additional studies/ records that were reviewed today include: hospital records   Assessment and Plan: 1.  Hemodynamically unstable VT He continues to have significant burden of NSVT hovering around  detection.  I have lowered VT zone to 140bpm today. He has significant anxiety about recurrent VT/ICD shocks and is not tolerating amiodarone. He has previously failed sotalol.  Treatment options reviewed with the patient and his wife today.   Therapeutic strategies for ventricular tachycardia including medicine and ablation were discussed in detail with the patient today. Risk, benefits, and alternatives to EP study and radiofrequency ablation were also discussed in detail today. These risks include but are not limited to stroke, bleeding, vascular damage, tamponade, perforation, damage to the heart and other structures, AV block requiring pacemaker, worsening renal function, and death. The patient understands these risk and wishes to proceed.  Will schedule for next Thursday.  Stop amiodarone on Monday. Stop Coreg Wednesday morning.  Keep K >3.9, Mg >1.8 No driving x6 months - pt aware LFT's normal recently. TSH today   2.  CAD s/p CABG No recent ischemic symptoms Cath 01/2016 with patent grafts  3.  Chronic systolic heart failure Euvolemic on exam Continue current therapy  LV lead off per Dr Colton Husbands with underlying RBBB  Current medicines are reviewed at length with the patient today.   The patient does not have concerns regarding his medicines.  The following changes were made today:  none  Labs/ tests ordered today include: TSH, pre-procedure labs  Orders Placed This Encounter  Procedures  . Basic metabolic panel  . TSH  . CBC  . CUP [facility] DEVICE CHECK    Randolm Idol MD 06/02/2016 5:01 PM  [facility] [address] [place] [address] [phone] (office) [phone] (fax

## 2016-06-08 NOTE — Anesthesia Preprocedure Evaluation (Addendum)
Anesthesia Evaluation  Patient identified by MRN, date of birth, ID band Patient awake    Reviewed: Allergy & Precautions, NPO status , Patient's Chart, lab work & pertinent test results  Airway Mallampati: I  TM Distance: >3 FB Neck ROM: Full    Dental  (+) Teeth Intact, Dental Advisory Given   Pulmonary sleep apnea , former smoker,    Pulmonary exam normal        Cardiovascular + CAD and + Past MI  Normal cardiovascular exam+ Cardiac Defibrillator      Neuro/Psych    GI/Hepatic   Endo/Other  diabetes, Type 2, Oral Hypoglycemic Agents  Renal/GU      Musculoskeletal   Abdominal   Peds  Hematology   Anesthesia Other Findings   Reproductive/Obstetrics                            Anesthesia Physical Anesthesia Plan  ASA: III  Anesthesia Plan: General   Post-op Pain Management:    Induction: Intravenous  Airway Management Planned: LMA  Additional Equipment:   Intra-op Plan:   Post-operative Plan: Extubation in OR  Informed Consent: I have reviewed the patients History and Physical, chart, labs and discussed the procedure including the risks, benefits and alternatives for the proposed anesthesia with the patient or authorized representative who has indicated his/her understanding and acceptance.   Dental advisory given  Plan Discussed with: CRNA, Surgeon and Anesthesiologist  Anesthesia Plan Comments:        Anesthesia Quick Evaluation

## 2016-06-08 NOTE — Transfer of Care (Signed)
Immediate Anesthesia Transfer of Care Note  Patient: [name]  Procedure(s) Performed: Procedure(s): V Tach Ablation (N/A)  Patient Location: Cath Lab  Anesthesia Type:General  Level of Consciousness: awake, alert , oriented and patient cooperative  Airway & Oxygen Therapy: Patient Spontanous Breathing and Patient connected to nasal cannula oxygen  Post-op Assessment: Report given to RN, Post -op Vital signs reviewed and stable and Patient moving all extremities X 4  Post vital signs: Reviewed and stable  Last Vitals:  Vitals:   06/08/16 0810  BP: 138/80  Pulse: 75  Temp: 37.1 C    Last Pain:  Vitals:   06/08/16 0810  TempSrc: Oral      Patients Stated Pain Goal: 3 (06/08/16 0841)  Complications: No apparent anesthesia complications

## 2016-06-08 NOTE — Progress Notes (Addendum)
Site area: RFA x 1--RFV x 3 Site Prior to Removal:  Level 0 Pressure Applied For:90 min Manual:   yes  Patient Status During Pull:  stable Post Pull Site:  Level 1 Post Pull Instructions Given:  yes Post Pull Pulses Present: palpable Dressing Applied:  tegaderm Bedrest begins @ 1630 till 2230 Comments:area hard to palpation above A and V sites-  assisted by Lance BoschBryan Robinson

## 2016-06-08 NOTE — Progress Notes (Signed)
Bedrest complete. Rt groin level "0". Pedal pulse strong. Pt able to ambulate to bathroom with assistance without complication. Pt denies pain. Will continue to monitor.  Viviano SimasMorgan N Givanni Staron, RN

## 2016-06-08 NOTE — Anesthesia Postprocedure Evaluation (Signed)
Anesthesia Post Note  Patient: [name]  Procedure(s) Performed: Procedure(s) (LRB): V Tach Ablation (N/A)  Patient location during evaluation: PACU Anesthesia Type: General Level of consciousness: awake and alert Pain management: pain level controlled Vital Signs Assessment: post-procedure vital signs reviewed and stable Respiratory status: spontaneous breathing, nonlabored ventilation, respiratory function stable and patient connected to nasal cannula oxygen Cardiovascular status: blood pressure returned to baseline and stable Postop Assessment: no signs of nausea or vomiting Anesthetic complications: no       Last Vitals:  Vitals:   06/08/16 1540 06/08/16 1545  BP: (!) 94/50 (!) 99/47  Pulse: (!) 59 (!) 59  Resp: 13 11  Temp: 36.7 C     Last Pain:  Vitals:   06/08/16 1540  TempSrc: Temporal                 Cresencio Reesor DAVID

## 2016-06-08 NOTE — Interval H&P Note (Signed)
History and Physical Interval Note:  06/08/2016 10:36 AM  [name]  has presented today for surgery, with the diagnosis of VT  The various methods of treatment have been discussed with the patient and family. After consideration of risks, benefits and other options for treatment, the patient has consented to  Procedure(s): V Tach Ablation (N/A) as a surgical intervention .  The patient's history has been reviewed, patient examined, no change in status, stable for surgery.  I have reviewed the patient's chart and labs.  Questions were answered to the patient's satisfaction.     Hillis RangeJames Ladaisha Portillo

## 2016-06-08 NOTE — Anesthesia Procedure Notes (Addendum)
Procedure Name: Intubation Date/Time: 06/08/2016 11:05 AM Performed by: Burt EkURNER, [name] Pre-anesthesia Checklist: Patient identified, Emergency Drugs available, Suction available, Patient being monitored and Timeout performed Patient Re-evaluated:Patient Re-evaluated prior to inductionOxygen Delivery Method: Circle system utilized Preoxygenation: Pre-oxygenation with 100% oxygen Intubation Type: IV induction Ventilation: Two handed mask ventilation required and Oral airway inserted - appropriate to patient size Laryngoscope Size: Hyacinth MeekerMiller and 2 Grade View: Grade I Tube type: Oral Tube size: 7.5 mm Number of attempts: 1 Airway Equipment and Method: Stylet Placement Confirmation: ETT inserted through vocal cords under direct vision,  positive ETCO2 and breath sounds checked- equal and bilateral Secured at: 23 cm Tube secured with: Tape Dental Injury: Teeth and Oropharynx as per pre-operative assessment

## 2016-06-09 ENCOUNTER — Encounter (HOSPITAL_COMMUNITY): Payer: Self-pay | Admitting: Internal Medicine

## 2016-06-09 DIAGNOSIS — I5022 Chronic systolic (congestive) heart failure: Secondary | ICD-10-CM | POA: Diagnosis not present

## 2016-06-09 DIAGNOSIS — I480 Paroxysmal atrial fibrillation: Secondary | ICD-10-CM | POA: Diagnosis not present

## 2016-06-09 DIAGNOSIS — I472 Ventricular tachycardia: Secondary | ICD-10-CM | POA: Diagnosis not present

## 2016-06-09 DIAGNOSIS — I110 Hypertensive heart disease with heart failure: Secondary | ICD-10-CM | POA: Diagnosis not present

## 2016-06-09 LAB — BASIC METABOLIC PANEL
Anion gap: 8 (ref 5–15)
BUN: 17 mg/dL (ref 6–20)
CHLORIDE: 107 mmol/L (ref 101–111)
CO2: 23 mmol/L (ref 22–32)
Calcium: 8.8 mg/dL — ABNORMAL LOW (ref 8.9–10.3)
Creatinine, Ser: 1.67 mg/dL — ABNORMAL HIGH (ref 0.61–1.24)
GFR calc non Af Amer: 43 mL/min — ABNORMAL LOW (ref >60)
GFR, EST AFRICAN AMERICAN: 50 mL/min — AB (ref >60)
Glucose, Bld: 130 mg/dL — ABNORMAL HIGH (ref 65–99)
POTASSIUM: 4.3 mmol/L (ref 3.5–5.1)
SODIUM: 138 mmol/L (ref 135–145)

## 2016-06-09 LAB — MAGNESIUM: MAGNESIUM: 1.4 mg/dL — AB (ref 1.7–2.4)

## 2016-06-09 LAB — GLUCOSE, CAPILLARY
GLUCOSE-CAPILLARY: 124 mg/dL — AB (ref 65–99)
Glucose-Capillary: 147 mg/dL — ABNORMAL HIGH (ref 65–99)

## 2016-06-09 MED ORDER — AMIODARONE HCL 400 MG PO TABS: 200.0000 mg | ORAL_TABLET | Freq: Every day | ORAL | 3 refills | Status: DC

## 2016-06-09 NOTE — Discharge Instructions (Signed)
No driving. No lifting over 5 lbs for 1 week. No vigorous or sexual activity for 1 week. You may return to work in 1 week. Keep procedure site clean & dry. If you notice increased pain, swelling, bleeding or pus, call/return!  You may shower, but no soaking baths/hot tubs/pools for 1 week.     Cardiac Ablation Cardiac ablation is a procedure to stop some heart tissue from causing problems. The heart has many electrical connections. Sometimes these connections make the heart beat very fast or irregularly. Removing some problem areas can improve the heart rhythm or make it normal. What happens before the procedure?  Follow instructions from your doctor about what you cannot eat or drink.  Ask your doctor about:  Changing or stopping your normal medicines. This is important if you take diabetes medicines or blood thinners.  Taking medicines such as aspirin and ibuprofen. These medicines can thin your blood. Do not take these medicines before your procedure if your doctor tells you not to.  Plan to have someone take you home.  If you will be going home right after the procedure, plan to have someone with you for 24 hours. What happens during the procedure?  To lower your risk of infection:  Your health care team will wash or sanitize their hands.  Your skin will be washed with soap.  Hair may be removed from your neck or groin.  An IV tube will be put into one of your veins.  You will be given a medicine to help you relax (sedative).  Skin on your neck or groin will be numbed.  A cut (incision) will be made in your neck or groin.  A needle will be put through your cut and into a vein in your neck or groin.  A tube (catheter) will be put into the needle. The tube will be moved to your heart. X-rays (fluoroscopy) will be used to help guide the tube.  Small devices (electrodes) on the tip of the tube will send out electrical currents.  Dye may be put through the tube. This helps  your surgeon see your heart.  Electrical energy will be used to scar (ablate) some heart tissue. Your surgeon may use:  Heat (radiofrequency energy).  Laser energy.  Extreme cold (cryoablation).  The tube will be taken out.  Pressure will be held on your cut. This helps stop bleeding.  A bandage (dressing) will be put on your cut. The procedure may vary. What happens after the procedure?  You will be monitored until your medicines have worn off.  Your cut will be watched for bleeding. You will need to lie still for a few hours.  Do not drive for 24 hours or as long as your doctor tells you. Summary  Cardiac ablation is a procedure to stop some heart tissue from causing problems.  Electrical energy will be used to scar (ablate) some heart tissue. This information is not intended to replace advice given to you by your health care provider. Make sure you discuss any questions you have with your health care provider. Document Released: 11/27/2012 Document Revised: 02/14/2016 Document Reviewed: 02/14/2016 Elsevier Interactive Patient Education  2017 Elsevier Inc.    Heart-Healthy Eating Plan Heart-healthy meal planning includes:  Limiting unhealthy fats.  Increasing healthy fats.  Making other small dietary changes. You may need to talk with your doctor or a diet specialist (dietitian) to create an eating plan that is right for you. What types of fat should I  choose?  Choose healthy fats. These include olive oil and canola oil, flaxseeds, walnuts, almonds, and seeds.  Eat more omega-3 fats. These include salmon, mackerel, sardines, tuna, flaxseed oil, and ground flaxseeds. Try to eat fish at least twice each week.  Limit saturated fats.  Saturated fats are often found in animal products, such as meats, butter, and cream.  Plant sources of saturated fats include palm oil, palm kernel oil, and coconut oil.  Avoid foods with partially hydrogenated oils in them. These  include stick margarine, some tub margarines, cookies, crackers, and other baked goods. These contain trans fats. What general guidelines do I need to follow?  Check food labels carefully. Identify foods with trans fats or high amounts of saturated fat.  Fill one half of your plate with vegetables and green salads. Eat 4-5 servings of vegetables per day. A serving of vegetables is:  1 cup of raw leafy vegetables.   cup of raw or cooked cut-up vegetables.   cup of vegetable juice.  Fill one fourth of your plate with whole grains. Look for the word "whole" as the first word in the ingredient list.  Fill one fourth of your plate with lean protein foods.  Eat 4-5 servings of fruit per day. A serving of fruit is:  One medium whole fruit.   cup of dried fruit.   cup of fresh, frozen, or canned fruit.   cup of 100% fruit juice.  Eat more foods that contain soluble fiber. These include apples, broccoli, carrots, beans, peas, and barley. Try to get 20-30 g of fiber per day.  Eat more home-cooked food. Eat less restaurant, buffet, and fast food.  Limit or avoid alcohol.  Limit foods high in starch and sugar.  Avoid fried foods.  Avoid frying your food. Try baking, boiling, grilling, or broiling it instead. You can also reduce fat by:  Removing the skin from poultry.  Removing all visible fats from meats.  Skimming the fat off of stews, soups, and gravies before serving them.  Steaming vegetables in water or broth.  Lose weight if you are overweight.  Eat 4-5 servings of nuts, legumes, and seeds per week:  One serving of dried beans or legumes equals  cup after being cooked.  One serving of nuts equals 1 ounces.  One serving of seeds equals  ounce or one tablespoon.  You may need to keep track of how much salt or sodium you eat. This is especially true if you have high blood pressure. Talk with your doctor or dietitian to get more information. What foods can I  eat? Grains  Breads, including [place], white, pita, wheat, raisin, rye, oatmeal, and [place]. Tortillas that are neither fried nor made with lard or trans fat. Low-fat rolls, including hotdog and hamburger buns and English muffins. Biscuits. Muffins. Waffles. Pancakes. Light popcorn. Whole-grain cereals. Flatbread. Melba toast. Pretzels. Breadsticks. Rusks. Low-fat snacks. Low-fat crackers, including oyster, saltine, matzo, graham, animal, and rye. Rice and pasta, including brown rice and pastas that are made with whole wheat. Vegetables  All vegetables. Fruits  All fruits, but limit coconut. Meats and Other Protein Sources  Lean, well-trimmed beef, veal, pork, and lamb. Chicken and [place] without skin. All fish and shellfish. Wild duck, rabbit, pheasant, and venison. Egg whites or low-cholesterol egg substitutes. Dried beans, peas, lentils, and tofu. Seeds and most nuts. Dairy  Low-fat or nonfat cheeses, including ricotta, string, and mozzarella. Skim or 1% milk that is liquid, powdered, or evaporated. Buttermilk that is  made with low-fat milk. Nonfat or low-fat yogurt. Beverages  Mineral water. Diet carbonated beverages. Sweets and Desserts  Sherbets and fruit ices. Honey, jam, marmalade, jelly, and syrups. Meringues and gelatins. Pure sugar candy, such as hard candy, jelly beans, gumdrops, mints, marshmallows, and small amounts of dark chocolate. [employer]. Eat all sweets and desserts in moderation. Fats and Oils  Nonhydrogenated (trans-free) margarines. Vegetable oils, including soybean, sesame, sunflower, olive, peanut, safflower, corn, canola, and cottonseed. Salad dressings or mayonnaise made with a vegetable oil. Limit added fats and oils that you use for cooking, baking, salads, and as spreads. Other  Cocoa powder. Coffee and tea. All seasonings and condiments. The items listed above may not be a complete list of recommended foods or beverages. Contact your dietitian for more  options.  What foods are not recommended? Grains  Breads that are made with saturated or trans fats, oils, or whole milk. Croissants. Butter rolls. Cheese breads. Sweet rolls. Donuts. Buttered popcorn. Chow mein noodles. High-fat crackers, such as cheese or butter crackers. Meats and Other Protein Sources  Fatty meats, such as hotdogs, short ribs, sausage, spareribs, bacon, rib eye roast or steak, and mutton. High-fat deli meats, such as salami and bologna. Caviar. Domestic duck and goose. Organ meats, such as kidney, liver, sweetbreads, and heart. Dairy  Cream, sour cream, cream cheese, and creamed cottage cheese. Whole-milk cheeses, including blue (bleu), [address], [place], [place], [address], [place], [address], cheddar, [place], and [place]. Whole or 2% milk that is liquid, evaporated, or condensed. Whole buttermilk. Cream sauce or high-fat cheese sauce. Yogurt that is made from whole milk. Beverages  Regular sodas and juice drinks with added sugar. Sweets and Desserts  Frosting. Pudding. Cookies. Cakes other than angel food cake. Candy that has milk chocolate or white chocolate, hydrogenated fat, butter, coconut, or unknown ingredients. Buttered syrups. Full-fat ice cream or ice cream drinks. Fats and Oils  Gravy that has suet, meat fat, or shortening. Cocoa butter, hydrogenated oils, palm oil, coconut oil, palm kernel oil. These can often be found in baked products, candy, fried foods, nondairy creamers, and whipped toppings. Solid fats and shortenings, including bacon fat, salt pork, lard, and butter. Nondairy cream substitutes, such as coffee creamers and sour cream substitutes. Salad dressings that are made of unknown oils, cheese, or sour cream. The items listed above may not be a complete list of foods and beverages to avoid. Contact your dietitian for more information.  This information is not intended to replace advice given to you by your health care provider. Make sure you discuss any  questions you have with your health care provider. Document Released: 09/26/2011 Document Revised: 09/02/2015 Document Reviewed: 09/18/2013 Elsevier Interactive Patient Education  2017 [employer].

## 2016-07-10 NOTE — Progress Notes (Signed)
Electrophysiology Office Note Date: 07/12/2016  ID:  Colton, Weaver 1956-02-15, MRN [id number]  PCP: Minda Meo, MD Primary Cardiologist: [place] Electrophysiologist: Graciela Husbands  CC: Follow-up for VT ablation   [name] is a 61 y.o. male seen today for Drs Klein/Allred. He has had recurrent appropriate therapy for VT despite AAD use.  He was previously on Sotalol and most recently had recurrent VT on amiodarone.  He underwent VT ablation 06/08/16 by Dr Johney Frame.  Since discharge, he has done very well. He feels "the best he has felt" in a long time. He does have headaches that he attributes to amiodarone and also still has significant anxiety around recurrent device therapy.  He denies chest pain, palpitations, dyspnea, PND, orthopnea, nausea, vomiting, dizziness, syncope, edema, weight gain, or early satiety.     Device History: MDT CRTD implanted 2013 for ICM, CHF, gen change 2016 History of appropriate therapy: yes History of AAD therapy: yes - Sotalol, amiodarone, VT ablation 06/2016   Past Medical History:  Diagnosis Date  . AICD (automatic cardioverter/defibrillator) present   . Anemia   . Arthritis    "mild in my joints & knuckles" (05/10/2016)  . CHF (congestive heart failure) (HCC)   . Coronary artery disease   . Hemochromatosis   . Hyperlipidemia   . Ischemic cardiomyopathy    EF 36%; prior anterior MI, s/p CABG x 3; s/p cath Feb 2013 showing grafts to be patent with severe LV dysfunction  . Myocardial infarction 2006  . PAF (paroxysmal atrial fibrillation) (HCC)   . Pneumonia 2000  . RBBB (right bundle branch block)   . Sleep apnea    "can't tolerate mask" (05/10/2016)  . Type II diabetes mellitus (HCC)   . Ventricular tachycardia, 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   Past Surgical History:  Procedure Laterality Date  . BI-VENTRICULAR IMPLANTABLE CARDIOVERTER DEFIBRILLATOR N/A 08/21/2011   Procedure: BI-VENTRICULAR IMPLANTABLE CARDIOVERTER DEFIBRILLATOR   (CRT-D);  Surgeon: Duke Salvia, MD;  Location: [facility] CATH LAB;  Service: Cardiovascular;  Laterality: N/A;  . BIV ICD GENERTAOR CHANGE OUT N/A 06/12/2014   Procedure: BIV ICD GENERTAOR CHANGE OUT;  Surgeon: Duke Salvia, MD;  Location: [facility] CATH LAB;  Service: Cardiovascular;  Laterality: N/A;  . CARDIAC CATHETERIZATION N/A 01/19/2016   Procedure: Left Heart Cath and Cors/Grafts Angiography;  Surgeon: Peter M [place], MD;  Location: [facility] INVASIVE CV LAB;  Service: Cardiovascular;  Laterality: N/A;  . CORONARY ARTERY BYPASS GRAFT  2006   Emergent CABG x 3 with LIMA to LAD, SVG to OM, SVG to distal RCA per Dr. Tyrone Sage  . FRACTURE SURGERY    . INGUINAL HERNIA REPAIR  02/09/2012   Procedure: HERNIA REPAIR INGUINAL ADULT;  Surgeon: Cherylynn Ridges, MD;  Location: [facility] OR;  Service: General;  Laterality: Right;  . INGUINAL HERNIA REPAIR Bilateral [id number]  . INSERTION OF MESH  02/09/2012   Procedure: INSERTION OF MESH;  Surgeon: Cherylynn Ridges, MD;  Location: [facility] OR;  Service: General;  Laterality: Right;  . LEFT HEART CATHETERIZATION WITH CORONARY/GRAFT ANGIOGRAM N/A 05/25/2011   Procedure: LEFT HEART CATHETERIZATION WITH Isabel Caprice;  Surgeon: Peter M [place], MD;  Location: [facility] CATH LAB;  Service: Cardiovascular;  Laterality: N/A;  . LIVER BIOPSY  1990s  . SHOULDER OPEN ROTATOR CUFF REPAIR Bilateral ?2000/~ 2007   left; right   . V TACH ABLATION N/A 06/08/2016   Procedure: V Tach Ablation;  Surgeon: Hillis Range, MD;  Location: [facility] INVASIVE CV LAB;  Service: Cardiovascular;  Laterality: N/A;  . VENTRICULAR ABLATION SURGERY  06/08/2016  . WRIST FRACTURE SURGERY Left 06/2005   "plates and screws and cadavaer bone"    Current Outpatient Prescriptions  Medication Sig Dispense Refill  . amiodarone (PACERONE) 400 MG tablet Take 0.5 tablets (200 mg total) by mouth daily. Take 200 mg daily 120 tablet 3  . aspirin 81 MG tablet Take 81 mg by mouth daily.    . carvedilol (COREG) 25 MG tablet Take 1  tablet (25 mg total) by mouth 2 (two) times daily with a meal. 135 tablet 2  . fenofibrate 160 MG tablet TAKE 1 TABLET BY MOUTH DAILY. 90 tablet 2  . furosemide (LASIX) 20 MG tablet TAKE 1/2 TABLET BY MOUTH DAILY. 30 tablet 11  . lisinopril (PRINIVIL,ZESTRIL) 10 MG tablet Take 0.5 tablets (5 mg total) by mouth daily. 30 tablet 11  . magnesium oxide (MAG-OX) 400 (241.3 Mg) MG tablet Take 1 tablet (400 mg total) by mouth daily. 30 tablet 11  . metFORMIN (GLUCOPHAGE) 500 MG tablet Take 1,000 mg by mouth 2 (two) times daily with a meal.     . NITROSTAT 0.4 MG SL tablet PLACE 1 TABLET UNDER THE TONGUE EVERY 5 MINUTES AS NEEDED FOR CHEST PAIN FOR UP TO 3 DOSES. 25 tablet 3  . Omega-3 Fatty Acids (RA FISH OIL) 1400 MG CPDR Take 1,400 mg by mouth 2 (two) times daily.    Letta Pate VERIO test strip 1 each by Other route as needed for other.   6  . spironolactone (ALDACTONE) 25 MG tablet Take 1 tablet (25 mg total) by mouth daily. 30 tablet 11   No current facility-administered medications for this visit.     Allergies:   Statins and Zetia [ezetimibe]   Social History: Social History   Social History  . Marital status: Married    Spouse name: N/A  . Number of children: 1  . Years of education: N/A   Occupational History  . telephone service    Social History Main Topics  . Smoking status: Former Smoker    Packs/day: 2.00    Years: 30.00    Types: Cigarettes    Quit date: 03/11/2005  . Smokeless tobacco: Never Used  . Alcohol use 1.8 oz/week    3 Cans of beer per week  . Drug use: No  . Sexual activity: Not Currently   Other Topics Concern  . Not on file   Social History Narrative  . No narrative on file    Family History: Family History  Problem Relation Age of Onset  . Adopted: Yes    Review of Systems: All other systems reviewed and are otherwise negative except as noted above.   Physical Exam: VS:  BP 112/64   Pulse 76   Ht 6' (1.829 m)   Wt 204 lb 8 oz (92.8 kg)    SpO2 97%   BMI 27.74 kg/m  , BMI Body mass index is 27.74 kg/m.  GEN- The patient is well appearing, alert and oriented x 3 today.   HEENT: normocephalic, atraumatic; sclera clear, conjunctiva pink; hearing intact; oropharynx clear; neck supple  Lungs- Clear to ausculation bilaterally, normal work of breathing.  No wheezes, rales, rhonchi Heart- Regular rate and rhythm, no murmurs, rubs or gallops  GI- soft, non-tender, non-distended, bowel sounds present  Extremities- no clubbing, cyanosis, or edema  MS- no significant deformity or atrophy Skin- warm and dry, no rash or lesion; ICD pocket well healed Psych- euthymic mood, full  affect Neuro- strength and sensation are intact  ICD interrogation- reviewed in detail today,  See PACEART report  EKG:  EKG is ordered today. The ekg ordered today shows AV pacing   Recent Labs: 05/08/2016: ALT 23 05/09/2016: B Natriuretic Peptide 149.5; Hemoglobin 14.2 06/02/2016: Platelets 213; TSH 1.880 06/09/2016: BUN 17; Creatinine, Ser 1.67; Magnesium 1.4; Potassium 4.3; Sodium 138   Wt Readings from Last 3 Encounters:  07/12/16 204 lb 8 oz (92.8 kg)  06/09/16 204 lb 5.9 oz (92.7 kg)  06/02/16 208 lb (94.3 kg)     Other studies Reviewed: Additional studies/ records that were reviewed today include: hospital records   Assessment and Plan:  1.  Chronic systolic dysfunction euvolemic today Stable on an appropriate medical regimen Normal ICD function See Pace Art report No changes today  2.  ICM/CAD No recent ischemic symptoms Continue medical therapy  3. Ventricular tachycardia  No recurrence since ablation Decrease amiodarone to  daily today No driving Follow up with Dr Johney Frame 8 weeks then Dr Graciela Husbands thereafter   4.  PTSD We discussed at length today He is getting better with time.     Current medicines are reviewed at length with the patient today.   The patient does not have concerns regarding his medicines.  The following  changes were made today: decrease amiodarone to  daily   Labs/ tests ordered today include: none Orders Placed This Encounter  Procedures  . CUP PACEART INCLINIC DEVICE CHECK     Disposition:   Follow up with Dr Johney Frame 8 weeks    Signed, Gypsy Balsam, NP 07/12/2016 11:42 AM  [facility] [address] [place] [address] [phone] (office) [phone] (fax)

## 2016-07-12 ENCOUNTER — Encounter (INDEPENDENT_AMBULATORY_CARE_PROVIDER_SITE_OTHER): Payer: Self-pay

## 2016-07-12 ENCOUNTER — Ambulatory Visit (INDEPENDENT_AMBULATORY_CARE_PROVIDER_SITE_OTHER): Payer: Medicare Other | Admitting: Nurse Practitioner

## 2016-07-12 VITALS — BP 112/64 | HR 76 | Ht 72.0 in | Wt 204.5 lb

## 2016-07-12 DIAGNOSIS — F4310 Post-traumatic stress disorder, unspecified: Secondary | ICD-10-CM

## 2016-07-12 DIAGNOSIS — I472 Ventricular tachycardia, unspecified: Secondary | ICD-10-CM

## 2016-07-12 DIAGNOSIS — I5022 Chronic systolic (congestive) heart failure: Secondary | ICD-10-CM

## 2016-07-12 DIAGNOSIS — I255 Ischemic cardiomyopathy: Secondary | ICD-10-CM | POA: Diagnosis not present

## 2016-07-12 LAB — CUP PACEART INCLINIC DEVICE CHECK
Date Time Interrogation Session: 20180404112801
Implantable Lead Implant Date: 20130513
Implantable Lead Implant Date: 20130513
Implantable Lead Location: 753858
Implantable Lead Model: 181
MDC IDC LEAD IMPLANT DT: 20130513
MDC IDC LEAD LOCATION: 753859
MDC IDC LEAD LOCATION: 753860
MDC IDC LEAD SERIAL: 316784
MDC IDC PG IMPLANT DT: 20160304

## 2016-07-12 NOTE — Patient Instructions (Addendum)
Medication Instructions:    START TAKING 100 MG AMIDAORONE ONCE A DAY    (UNTIL FOLLOW UP APPOINTMENT  WITH DR Johney Frame)  If you need a refill on your cardiac medications before your next appointment, please call your pharmacy.  Labwork: NONE ORDERED  TODAY    Testing/Procedures: NONE ORDERED  TODAY    Follow-Up: IN 6 TO 8 WEEKS WITIH DR ALLRED    Any Other Special Instructions Will Be Listed Below (If Applicable).

## 2016-07-31 ENCOUNTER — Other Ambulatory Visit: Payer: Self-pay | Admitting: Internal Medicine

## 2016-09-11 ENCOUNTER — Ambulatory Visit (INDEPENDENT_AMBULATORY_CARE_PROVIDER_SITE_OTHER): Payer: Medicare Other | Admitting: Internal Medicine

## 2016-09-11 VITALS — BP 125/69 | HR 82 | Ht 72.0 in | Wt 206.0 lb

## 2016-09-11 DIAGNOSIS — I255 Ischemic cardiomyopathy: Secondary | ICD-10-CM | POA: Diagnosis not present

## 2016-09-11 DIAGNOSIS — I5022 Chronic systolic (congestive) heart failure: Secondary | ICD-10-CM | POA: Diagnosis not present

## 2016-09-11 DIAGNOSIS — I472 Ventricular tachycardia, unspecified: Secondary | ICD-10-CM

## 2016-09-11 DIAGNOSIS — Z95810 Presence of automatic (implantable) cardiac defibrillator: Secondary | ICD-10-CM

## 2016-09-11 LAB — CUP PACEART INCLINIC DEVICE CHECK
Battery Voltage: 2.99 V
Brady Statistic AP VP Percent: 42.82 %
Brady Statistic AS VS Percent: 0.11 %
Date Time Interrogation Session: 20180604175353
HIGH POWER IMPEDANCE MEASURED VALUE: 72 Ohm
Implantable Lead Implant Date: 20130513
Implantable Lead Location: 753860
Implantable Lead Model: 181
Implantable Lead Serial Number: 316784
Lead Channel Impedance Value: 437 Ohm
Lead Channel Impedance Value: 494 Ohm
Lead Channel Impedance Value: 589 Ohm
Lead Channel Impedance Value: 855 Ohm
Lead Channel Pacing Threshold Amplitude: 0.75 V
Lead Channel Pacing Threshold Pulse Width: 0.4 ms
Lead Channel Sensing Intrinsic Amplitude: 1.125 mV
Lead Channel Sensing Intrinsic Amplitude: 11.875 mV
MDC IDC LEAD IMPLANT DT: 20130513
MDC IDC LEAD IMPLANT DT: 20130513
MDC IDC LEAD LOCATION: 753858
MDC IDC LEAD LOCATION: 753859
MDC IDC MSMT BATTERY REMAINING LONGEVITY: 81 mo
MDC IDC MSMT LEADCHNL RA PACING THRESHOLD PULSEWIDTH: 0.4 ms
MDC IDC MSMT LEADCHNL RA SENSING INTR AMPL: 1 mV
MDC IDC MSMT LEADCHNL RV IMPEDANCE VALUE: 494 Ohm
MDC IDC MSMT LEADCHNL RV IMPEDANCE VALUE: 494 Ohm
MDC IDC MSMT LEADCHNL RV PACING THRESHOLD AMPLITUDE: 0.75 V
MDC IDC MSMT LEADCHNL RV SENSING INTR AMPL: 8.875 mV
MDC IDC PG IMPLANT DT: 20160304
MDC IDC SET LEADCHNL RA PACING AMPLITUDE: 2 V
MDC IDC SET LEADCHNL RV PACING AMPLITUDE: 2.5 V
MDC IDC SET LEADCHNL RV PACING PULSEWIDTH: 0.4 ms
MDC IDC SET LEADCHNL RV SENSING SENSITIVITY: 0.45 mV
MDC IDC STAT BRADY AP VS PERCENT: 0.01 %
MDC IDC STAT BRADY AS VP PERCENT: 57.05 %
MDC IDC STAT BRADY RA PERCENT PACED: 37.16 %
MDC IDC STAT BRADY RV PERCENT PACED: 98.08 %

## 2016-09-11 NOTE — Progress Notes (Signed)
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  Since his VT ablation 06/08/16, the patient reports doing very well.  No further arrhythmias.  Very pleased with results.  Today, he denies symptoms of palpitations, chest pain, shortness of breath,  lower extremity edema, dizziness, presyncope, syncope, or ICD shocks.  The patient is otherwise without complaint today.   Past Medical History:  Diagnosis Date  . AICD (automatic cardioverter/defibrillator) present   . Anemia   . Arthritis    "mild in my joints & knuckles" (05/10/2016)  . CHF (congestive heart failure) (HCC)   . Coronary artery disease   . Hemochromatosis   . Hyperlipidemia   . Ischemic cardiomyopathy    EF 36%; prior anterior MI, s/p CABG x 3; s/p cath Feb 2013 showing grafts to be patent with severe LV dysfunction  . Myocardial infarction 2006  . PAF (paroxysmal atrial fibrillation) (HCC)   . Pneumonia 2000  . RBBB (right bundle branch block)   . Sleep apnea    "can't tolerate mask" (05/10/2016)  . Type II diabetes mellitus (HCC)   . Ventricular tachycardia, 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   Past Surgical History:  Procedure Laterality Date  . BI-VENTRICULAR IMPLANTABLE CARDIOVERTER DEFIBRILLATOR N/A 08/21/2011   Procedure: BI-VENTRICULAR IMPLANTABLE CARDIOVERTER DEFIBRILLATOR  (CRT-D);  Surgeon: Duke Salvia, MD;  Location: [facility] CATH LAB;  Service: Cardiovascular;  Laterality: N/A;  . BIV ICD GENERTAOR CHANGE OUT N/A 06/12/2014   Procedure: BIV ICD GENERTAOR CHANGE OUT;  Surgeon: Duke Salvia, MD;  Location: [facility] CATH LAB;  Service: Cardiovascular;  Laterality: N/A;  . CARDIAC CATHETERIZATION N/A 01/19/2016   Procedure: Left Heart Cath and Cors/Grafts Angiography;  Surgeon: Peter M Swaziland, MD;  Location: [facility] INVASIVE CV LAB;  Service: Cardiovascular;  Laterality: N/A;  . CORONARY ARTERY BYPASS GRAFT  2006   Emergent CABG x 3 with  LIMA to LAD, SVG to OM, SVG to distal RCA per Dr. Tyrone Sage  . FRACTURE SURGERY    . INGUINAL HERNIA REPAIR  02/09/2012   Procedure: HERNIA REPAIR INGUINAL ADULT;  Surgeon: Cherylynn Ridges, MD;  Location: [facility] OR;  Service: General;  Laterality: Right;  . INGUINAL HERNIA REPAIR Bilateral [id number]  . INSERTION OF MESH  02/09/2012   Procedure: INSERTION OF MESH;  Surgeon: Cherylynn Ridges, MD;  Location: [facility] OR;  Service: General;  Laterality: Right;  . LEFT HEART CATHETERIZATION WITH CORONARY/GRAFT ANGIOGRAM N/A 05/25/2011   Procedure: LEFT HEART CATHETERIZATION WITH Isabel Caprice;  Surgeon: Peter M Swaziland, MD;  Location: [facility] CATH LAB;  Service: Cardiovascular;  Laterality: N/A;  . LIVER BIOPSY  1990s  . SHOULDER OPEN ROTATOR CUFF REPAIR Bilateral ?2000/~ 2007   left; right   . V TACH ABLATION N/A 06/08/2016   Procedure: V Tach Ablation;  Surgeon: Hillis Range, MD;  Location: [facility] INVASIVE CV LAB;  Service: Cardiovascular;  Laterality: N/A;  . VENTRICULAR ABLATION SURGERY  06/08/2016  . WRIST FRACTURE SURGERY Left 06/2005   "plates and screws and cadavaer bone"    ROS- all systems are reviewed and negative except as per HPI above  Current Outpatient Prescriptions  Medication Sig Dispense Refill  . amiodarone (PACERONE) 200 MG tablet Take 100 mg by mouth daily.    Marland Kitchen aspirin 81 MG tablet Take 81 mg by mouth daily.    . carvedilol (COREG) 25 MG tablet Take 1 tablet (25 mg total) by  mouth 2 (two) times daily with a meal. 135 tablet 2  . fenofibrate 160 MG tablet TAKE 1 TABLET BY MOUTH DAILY. 90 tablet 3  . furosemide (LASIX) 20 MG tablet TAKE 1/2 TABLET BY MOUTH DAILY. 30 tablet 11  . lisinopril (PRINIVIL,ZESTRIL) 10 MG tablet Take 0.5 tablets (5 mg total) by mouth daily. 30 tablet 11  . magnesium oxide (MAG-OX) 400 (241.3 Mg) MG tablet Take 1 tablet (400 mg total) by mouth daily. 30 tablet 11  . metFORMIN (GLUCOPHAGE) 500 MG tablet Take 1,000 mg by mouth 2 (two) times daily with a meal.     .  NITROSTAT 0.4 MG SL tablet PLACE 1 TABLET UNDER THE TONGUE EVERY 5 MINUTES AS NEEDED FOR CHEST PAIN FOR UP TO 3 DOSES. 25 tablet 3  . Omega-3 Fatty Acids (RA FISH OIL) 1400 MG CPDR Take 1,400 mg by mouth 2 (two) times daily.    Letta Pate. ONETOUCH VERIO test strip 1 each by Other route as needed for other.   6  . spironolactone (ALDACTONE) 25 MG tablet Take 1 tablet (25 mg total) by mouth daily. 30 tablet 11   No current facility-administered medications for this visit.     Physical Exam: Vitals:   09/11/16 1610  BP: 125/69  Pulse: 82  SpO2: 96%  Weight: 206 lb (93.4 kg)  Height: 6' (1.829 m)    GEN- The patient is well appearing, alert and oriented x 3 today.   Head- normocephalic, atraumatic Eyes-  Sclera clear, conjunctiva pink Ears- hearing intact Oropharynx- clear Lungs- Clear to ausculation bilaterally, normal work of breathing Chest- ICD pocket is well healed Heart- Regular rate and rhythm, no murmurs, rubs or gallops, PMI not laterally displaced GI- soft, NT, ND, + BS Extremities- no clubbing, cyanosis, or edema  ICD interrogation- reviewed in detail today,  See PACEART report  Assessment and Plan:  1.  VT Well controlled s/p ablation He wishes to stop amiodarone after his July vacation.  I think that this is reasonable.  If he has further VT, our options would be sotalol or reablation.  Would like to avoid amiodarone given his young age.  2.  Chronic systolic dysfunction/ CAD euvolemic today, no ischemic symptoms Stable on an appropriate medical regimen Normal ICD function See Pace Art report No changes today  Follow-up with Dr Graciela HusbandsKlein in 3 months I will see as needed going forward  Hillis RangeJames Aniela Caniglia MD, [facility] Pa[facility]ACC 09/11/2016 5:29 PM

## 2016-09-11 NOTE — Patient Instructions (Addendum)
Medication Instructions:  Your physician has recommended you make the following change in your medication:  1) STOP Amiodarone in 6 weeks - October 23, 2016   Labwork: None ordered   Testing/Procedures: None ordered   Follow-Up: Your physician wants you to follow-up in: 3 months with Dr. Graciela HusbandsKlein   Remote monitoring is used to monitor your  ICD from home. This monitoring reduces the number of office visits required to check your device to one time per year. It allows us to keep an eye on the functioning of your device to ensure it is working properly. You are scheduled for a device check from home on 12/11/16. You may send your transmission at any time that day. If you have a wireless device, the transmission will be sent automatically. After your physician reviews your transmission, you will receive a postcard with your next transmission date.    Any Other Special Instructions Will Be Listed Below (If Applicable).     If you need a refill on your cardiac medications before your next appointment, please call your pharmacy.

## 2016-10-25 ENCOUNTER — Other Ambulatory Visit: Payer: Self-pay | Admitting: Internal Medicine

## 2016-11-29 ENCOUNTER — Encounter: Payer: Self-pay | Admitting: Internal Medicine

## 2016-12-04 NOTE — Progress Notes (Signed)
Patient ID: [name]                 DOB: June 06, 1955                    MRN: [id number]     HPI: [name] is a 61 y.o. male patient of Dr Graciela Husbands referred to lipid clinic by Gypsy Balsam, NP. PMH is significant for CAD s/p MI and CABG x3 vessels, CHF with LVEF 40-45%, PAF, ICD with hx of VT, DM2, HLD, and statin intolerance. At his most recent clinic visit on 06/02/16, PCSK9 therapy and clinic trials were discussed with pt and wife. He was not interested in pursuing PCSK9 at the time given recent VT w/ ICD shock and upcoming ablation. Pt was to follow up in 6 months to see if he would like to pursue lipid lowering therapy.  Pt presents today to assess readiness to start lipid lowering therapy. Pt states he does not want to take any injectable medication. He is also intolerant to Crestor 5mg  3x per week, 10mg  daily, Lipitor 40mg  daily, and Vytorin. He reported severe joint pain in his hips, knees, shoulders, and elbows with statin use. Symptoms would present about 1 week after starting therapy and would take about a week to resolve after drug discontinuation. Pt is very resistant to trying any new medication for his cholesterol. He states that he is taking too many medications to begin with and does not want to start anything new currently. He is still currently taking fenofibrate and fish oil.   Current Medications: fenofibrate 160 mg daily, fish oil 1400 mg BID  Intolerances: Crestor 5mg  3x per week and 10mg  daily, Lipitor 40mg  daily, Vytorin - myalgias Risk Factors:  CAD s/p MI and CABG x3 vessels LDL goal: 70mg /dL  Diet: Sandwiches (PB and J, ham and cheese), chicken soup. Watches his sugar since he has DM  Exercise: Minimal  Family History: Not on file - pt was adopted.  Social History: The patient reports that he quit smoking about 10 years ago. His smoking use included Cigarettes. He has a 60.00 pack-year smoking history. He has never used smokeless tobacco. He reports that  he drinks about 1.8 oz of alcohol per week . He reports that he does not use drugs.  Labs: 05/11/16: TC 259, TG 185, HDL 39, LDL 183 (fenofibrate and fish oil)  Past Medical History:  Diagnosis Date  . AICD (automatic cardioverter/defibrillator) present   . Anemia   . Arthritis    "mild in my joints & knuckles" (05/10/2016)  . CHF (congestive heart failure) (HCC)   . Coronary artery disease   . Hemochromatosis   . Hyperlipidemia   . Ischemic cardiomyopathy    EF 36%; prior anterior MI, s/p CABG x 3; s/p cath Feb 2013 showing grafts to be patent with severe LV dysfunction  . Myocardial infarction (HCC) 2006  . PAF (paroxysmal atrial fibrillation) (HCC)   . Pneumonia 2000  . RBBB (right bundle branch block)   . Sleep apnea    "can't tolerate mask" (05/10/2016)  . Type II diabetes mellitus (HCC)   . Ventricular tachycardia, 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    Current Outpatient Prescriptions on File Prior to Visit  Medication Sig Dispense Refill  . aspirin 81 MG tablet Take 81 mg by mouth daily.    . carvedilol (COREG) 25 MG tablet Take 1 tablet (25 mg total) by mouth 2 (two) times daily with a meal. 180 tablet 3  .  fenofibrate 160 MG tablet TAKE 1 TABLET BY MOUTH DAILY. 90 tablet 3  . furosemide (LASIX) 20 MG tablet TAKE 1/2 TABLET BY MOUTH DAILY. 30 tablet 11  . lisinopril (PRINIVIL,ZESTRIL) 10 MG tablet Take 0.5 tablets (5 mg total) by mouth daily. 30 tablet 11  . magnesium oxide (MAG-OX) 400 (241.3 Mg) MG tablet Take 1 tablet (400 mg total) by mouth daily. 30 tablet 11  . metFORMIN (GLUCOPHAGE) 500 MG tablet Take 1,000 mg by mouth 2 (two) times daily with a meal.     . NITROSTAT 0.4 MG SL tablet PLACE 1 TABLET UNDER THE TONGUE EVERY 5 MINUTES AS NEEDED FOR CHEST PAIN FOR UP TO 3 DOSES. 25 tablet 3  . Omega-3 Fatty Acids (RA FISH OIL) 1400 MG CPDR Take 1,400 mg by mouth 2 (two) times daily.    Letta Pate VERIO test strip 1 each by Other route as needed for other.   6  . spironolactone  (ALDACTONE) 25 MG tablet Take 1 tablet (25 mg total) by mouth daily. 30 tablet 11   No current facility-administered medications on file prior to visit.     Allergies  Allergen Reactions  . Statins Other (See Comments)    Myalgias   . Zetia [Ezetimibe] Other (See Comments)    myalgias    Assessment/Plan:  1. Hyperlipidemia: Pt's most recent LDL is 183, well elevated above his goal of <70 given his cardiac history. Pt is intolerant to multiple doses of Crestor, Lipitor, and Vytorin. He is resistant to taking any medications for his cholesterol. Discussed with pt the benefits of lowering LDL given his elevated cardiac risk and provided education regarding the safety and efficacy of statins, Zetia, PCSK9 inhibitors, and clinical trials at [facility]. However, pt remains uninterested in starting any new therapies for cholesterol at this time. I did provide him with information on Zetia therapy since he seemed least opposed to this option, but he stated he would call clinic if he decides to start therapy. Can follow up with pt as needed if he becomes agreeable to lipid lowering therapy.  -Durward Mallard, PharmD Student    Megan E. Supple, PharmD, CPP, BCACP [facility] [address] [place], [place] [address] Phone: [phone]; Fax: [phone] 12/05/2016 10:26 AM

## 2016-12-05 ENCOUNTER — Ambulatory Visit (INDEPENDENT_AMBULATORY_CARE_PROVIDER_SITE_OTHER): Payer: Medicare Other | Admitting: Pharmacist

## 2016-12-05 DIAGNOSIS — E784 Other hyperlipidemia: Secondary | ICD-10-CM

## 2016-12-05 DIAGNOSIS — E7849 Other hyperlipidemia: Secondary | ICD-10-CM

## 2016-12-05 NOTE — Patient Instructions (Signed)
It was nice to see you today  Please read over the information about Zetia and call clinic if you would like to start this medicine to help lower your cholesterol  Clinic [phone]

## 2016-12-06 ENCOUNTER — Other Ambulatory Visit: Payer: Self-pay | Admitting: Cardiology

## 2016-12-06 ENCOUNTER — Telehealth: Payer: Self-pay | Admitting: Internal Medicine

## 2016-12-06 NOTE — Telephone Encounter (Signed)
REFILL 

## 2016-12-06 NOTE — Telephone Encounter (Signed)
New message   Pt called this in :  1. What dental office are you calling from?  Dr Mayford Knife   2. What is your office phone and fax number? [phone]   3. What type of procedure is the patient having performed? extraction  4. What date is procedure scheduled? 12/12/16   5. What is your question (ex. Antibiotics prior to procedure, holding medication-we need to know how long dentist wants pt to hold med)?  Pt just had ablation , is there anything special he needs to be aware of

## 2016-12-06 NOTE — Telephone Encounter (Signed)
Patient had a VT ablation in March with Dr. Johney FrameAllred- seen in follow up in June and doing well.  Patient has stopped amiodarone. Will forward to Dr. Graciela HusbandsKlein to review any special recommendations.

## 2016-12-07 NOTE — Telephone Encounter (Signed)
I called and spoke with the patient- he states he had a tooth crack in half and is scheduled for a single tooth extraction on Tuesday morning at 9:00 am.   I advised the patient I have his message for Dr. Graciela HusbandsKlein to review, but for simple tooth extraction, there should be no problem with this in regards to the ablation that he had done. He is aware I will clarify any precautions/ restrictions with Dr. Graciela HusbandsKlein and get this to the dental office for his appointment on Tuesday.  I also advised I will not call him back unless there is an issue. He is agreeable.

## 2016-12-07 NOTE — Telephone Encounter (Signed)
New Message  Pt call requesting to speak with RN to f/u on clearance for procedure. Pt states the dental office will be closed Friday and his procedure is on Tuesday. Please call back to discuss

## 2016-12-08 NOTE — Telephone Encounter (Signed)
Per Dr. Graciela HusbandsKlein- no contraindication for tooth extraction to be done, no pre-medication needed.

## 2016-12-12 NOTE — Telephone Encounter (Signed)
Late entry- phone note was faxed to Dr. Norris Crossurner's office this morning- [phone]- confirmation received.

## 2016-12-20 ENCOUNTER — Encounter: Payer: Self-pay | Admitting: Internal Medicine

## 2016-12-20 ENCOUNTER — Ambulatory Visit (INDEPENDENT_AMBULATORY_CARE_PROVIDER_SITE_OTHER): Payer: Medicare Other | Admitting: Internal Medicine

## 2016-12-20 VITALS — BP 114/60 | HR 68 | Ht 72.0 in | Wt 208.0 lb

## 2016-12-20 DIAGNOSIS — I4510 Unspecified right bundle-branch block: Secondary | ICD-10-CM | POA: Diagnosis not present

## 2016-12-20 DIAGNOSIS — I255 Ischemic cardiomyopathy: Secondary | ICD-10-CM

## 2016-12-20 DIAGNOSIS — I472 Ventricular tachycardia, unspecified: Secondary | ICD-10-CM

## 2016-12-20 DIAGNOSIS — Z95810 Presence of automatic (implantable) cardiac defibrillator: Secondary | ICD-10-CM | POA: Diagnosis not present

## 2016-12-20 DIAGNOSIS — I5022 Chronic systolic (congestive) heart failure: Secondary | ICD-10-CM

## 2016-12-20 LAB — CUP PACEART INCLINIC DEVICE CHECK
Battery Remaining Longevity: 76 mo
Brady Statistic AS VS Percent: 0.1 %
HighPow Impedance: 62 Ohm
Implantable Lead Implant Date: 20130513
Implantable Lead Implant Date: 20130513
Implantable Lead Location: 753859
Implantable Lead Location: 753860
Implantable Lead Model: 181
Implantable Lead Model: 4396
Implantable Lead Model: 5076
Implantable Lead Serial Number: 316784
Implantable Pulse Generator Implant Date: 20160304
Lead Channel Impedance Value: 494 Ohm
Lead Channel Pacing Threshold Pulse Width: 0.4 ms
Lead Channel Pacing Threshold Pulse Width: 0.4 ms
Lead Channel Setting Pacing Amplitude: 2 V
MDC IDC LEAD IMPLANT DT: 20130513
MDC IDC LEAD LOCATION: 753858
MDC IDC MSMT LEADCHNL LV IMPEDANCE VALUE: 570 Ohm
MDC IDC MSMT LEADCHNL RA PACING THRESHOLD AMPLITUDE: 0.75 V
MDC IDC MSMT LEADCHNL RA SENSING INTR AMPL: 1 mV
MDC IDC MSMT LEADCHNL RV IMPEDANCE VALUE: 494 Ohm
MDC IDC MSMT LEADCHNL RV PACING THRESHOLD AMPLITUDE: 0.75 V
MDC IDC MSMT LEADCHNL RV SENSING INTR AMPL: 12.6 mV
MDC IDC SESS DTM: 20180912105433
MDC IDC SET LEADCHNL RV PACING AMPLITUDE: 2.5 V
MDC IDC SET LEADCHNL RV PACING PULSEWIDTH: 0.4 ms
MDC IDC SET LEADCHNL RV SENSING SENSITIVITY: 0.45 mV
MDC IDC STAT BRADY AP VP PERCENT: 29.5 %
MDC IDC STAT BRADY AP VS PERCENT: 0.1 % — AB
MDC IDC STAT BRADY AS VP PERCENT: 70.4 %

## 2016-12-20 LAB — BASIC METABOLIC PANEL
BUN / CREAT RATIO: 11 (ref 10–24)
BUN: 14 mg/dL (ref 8–27)
CALCIUM: 9.5 mg/dL (ref 8.6–10.2)
CHLORIDE: 99 mmol/L (ref 96–106)
CO2: 23 mmol/L (ref 20–29)
Creatinine, Ser: 1.24 mg/dL (ref 0.76–1.27)
GFR calc Af Amer: 73 mL/min/{1.73_m2} (ref >59)
GFR calc non Af Amer: 63 mL/min/{1.73_m2} (ref >59)
GLUCOSE: 145 mg/dL — AB (ref 65–99)
Potassium: 4.8 mmol/L (ref 3.5–5.2)
Sodium: 139 mmol/L (ref 134–144)

## 2016-12-20 LAB — MAGNESIUM: Magnesium: 1.4 mg/dL — ABNORMAL LOW (ref 1.6–2.3)

## 2016-12-20 NOTE — Progress Notes (Signed)
Patient Care Team: Burnard Bunting, MD as PCP - General (Internal Medicine)   HPI  [name] is a 61 y.o. male Seen in followup for an CRT-D implanted for primary prevention April 2013 for ischemic cardiomyopathy and prior myocardial infarction and right bundle branch block. He underwent device generator reimplantation 2/16  He was admitted 10/17 w VT storm-- noted to be mildly hypokalemic; Aldactone and Mag started. Nitrates added for possible ischemic trigger m Sotalol added   He is modestly improved following CRT. He is less winded. He still develops fatigue. He has daytime somnolence and obstructive sleep apnea. He's had a sleep study but is not inclined to CPAP therapy. There is been no significant change in functional status following the off programming of his LV lead  DATE TEST    3/13 echo EF 40-45%   3/16 echo EF 30-35%   10/17 cath EF 35-40% LIMA/SVG OM2-PDA -- patent , severe native disease  1/18 Echo EF 40-45%     Date Cr Mg K LDL  10.13   1.9       11/17  1.49   4.8   3/18 1.67 1.4 4.3 183   He had recurrent ventricular tachycardia despite the sotalol. He underwent catheter ablation by Dr. Greggory Brandy 3/18.  6/18 he was seen in follow-up without interval VT  He has felt much better following ablation. He has more energy. Still has modest exercise intolerance. Without Chest pain or peripheral edema.   He has met with PhD Supple re PCSK 9.   Past Medical History:  Diagnosis Date  . AICD (automatic cardioverter/defibrillator) present   . Anemia   . Arthritis    "mild in my joints & knuckles" (05/10/2016)  . CHF (congestive heart failure) ([place])   . Coronary artery disease   . Hemochromatosis   . Hyperlipidemia   . Ischemic cardiomyopathy    EF 36%; prior anterior MI, s/p CABG x 3; s/p cath Feb 2013 showing grafts to be patent with severe LV dysfunction  . Myocardial infarction ([place]) 2006  . PAF (paroxysmal atrial fibrillation)  ([place])   . Pneumonia 2000  . RBBB (right bundle branch block)   . Sleep apnea    "can't tolerate mask" (05/10/2016)  . Type II diabetes mellitus ([place])   . Ventricular tachycardia, sustained ([place]) 01/19/2016    Past Surgical History:  Procedure Laterality Date  . BI-VENTRICULAR IMPLANTABLE CARDIOVERTER DEFIBRILLATOR N/A 08/21/2011   Procedure: BI-VENTRICULAR IMPLANTABLE CARDIOVERTER DEFIBRILLATOR  (CRT-D);  Surgeon: Deboraha Sprang, MD;  Location: [facility] CATH LAB;  Service: Cardiovascular;  Laterality: N/A;  . BIV ICD GENERTAOR CHANGE OUT N/A 06/12/2014   Procedure: BIV ICD GENERTAOR CHANGE OUT;  Surgeon: Deboraha Sprang, MD;  Location: [facility] CATH LAB;  Service: Cardiovascular;  Laterality: N/A;  . CARDIAC CATHETERIZATION N/A 01/19/2016   Procedure: Left Heart Cath and Cors/Grafts Angiography;  Surgeon: Peter M Martinique, MD;  Location: [place] CV LAB;  Service: Cardiovascular;  Laterality: N/A;  . CORONARY ARTERY BYPASS GRAFT  2006   Emergent CABG x 3 with LIMA to LAD, SVG to OM, SVG to distal RCA per Dr. Servando Snare  . FRACTURE SURGERY    . INGUINAL HERNIA REPAIR  02/09/2012   Procedure: HERNIA REPAIR INGUINAL ADULT;  Surgeon: Gwenyth Ober, MD;  Location: [place];  Service: General;  Laterality: Right;  . INGUINAL HERNIA REPAIR Bilateral [id number]  . INSERTION OF MESH  02/09/2012   Procedure: INSERTION OF MESH;  Surgeon:  Gwenyth Ober, MD;  Location: [place];  Service: General;  Laterality: Right;  . LEFT HEART CATHETERIZATION WITH CORONARY/GRAFT ANGIOGRAM N/A 05/25/2011   Procedure: LEFT HEART CATHETERIZATION WITH Beatrix Fetters;  Surgeon: Peter M Martinique, MD;  Location: [facility] CATH LAB;  Service: Cardiovascular;  Laterality: N/A;  . LIVER BIOPSY  1990s  . SHOULDER OPEN ROTATOR CUFF REPAIR Bilateral ?2000/~ 2007   left; right   . V TACH ABLATION N/A 06/08/2016   Procedure: V Tach Ablation;  Surgeon: Thompson Grayer, MD;  Location: [place] CV LAB;  Service: Cardiovascular;  Laterality: N/A;  .  VENTRICULAR ABLATION SURGERY  06/08/2016  . WRIST FRACTURE SURGERY Left 06/2005   "plates and screws and cadavaer bone"    Current Outpatient Prescriptions  Medication Sig Dispense Refill  . aspirin 81 MG tablet Take 81 mg by mouth daily.    . carvedilol (COREG) 25 MG tablet Take 1 tablet (25 mg total) by mouth 2 (two) times daily with a meal. 180 tablet 3  . fenofibrate 160 MG tablet TAKE 1 TABLET BY MOUTH DAILY. 90 tablet 3  . furosemide (LASIX) 20 MG tablet TAKE 1/2 TABLET BY MOUTH DAILY. 30 tablet 3  . lisinopril (PRINIVIL,ZESTRIL) 10 MG tablet Take 0.5 tablets (5 mg total) by mouth daily. 30 tablet 11  . magnesium oxide (MAG-OX) 400 (241.3 Mg) MG tablet Take 1 tablet (400 mg total) by mouth daily. 30 tablet 11  . metFORMIN (GLUCOPHAGE) 500 MG tablet Take 1,000 mg by mouth 2 (two) times daily with a meal.     . NITROSTAT 0.4 MG SL tablet PLACE 1 TABLET UNDER THE TONGUE EVERY 5 MINUTES AS NEEDED FOR CHEST PAIN FOR UP TO 3 DOSES. 25 tablet 3  . Omega-3 Fatty Acids (RA FISH OIL) 1400 MG CPDR Take 1,400 mg by mouth 2 (two) times daily.    Glory Rosebush VERIO test strip 1 each by Other route as needed for other.   6  . spironolactone (ALDACTONE) 25 MG tablet Take 1 tablet (25 mg total) by mouth daily. 30 tablet 11   No current facility-administered medications for this visit.     Allergies  Allergen Reactions  . Statins Other (See Comments)    Myalgias   . Zetia [Ezetimibe] Other (See Comments)    myalgias      Review of Systems negative except from HPI and PMH  Physical Exam BP 114/60   Pulse 68   Ht 6' (1.829 m)   Wt 208 lb (94.3 kg)   SpO2 95%   BMI 28.21 kg/m  Well developed and well nourished in no acute distress HENT normal E scleral and icterus clear Neck Supple JVP flat; carotids brisk and full  Clear to ausculation Regular rate and rhythm, no murmurs gallops or rub Soft with active bowel sounds No clubbing cyanosis  Edema Alert and oriented, grossly normal  motor and sensory function Skin Warm and Dry  ECG demonstrates sinus rhythm  74 bpm P-synchronous/ AV  pacing   Assessment and  Plan  Ischemic cardiomyopathy  VT storm s/p ablation   CRT-Medtronic The patient's device was interrogated and the information was fully reviewed. The device was reprogrammed to Maximize longevity  RBBB  Congestive heart failure-chronic systolic  High output LV capture threshold  Renal insuff grade 3  Hyperlipidemia  Without symptoms of ischemia  Review hyperlipidemia and PCSK 9 inhibitors.  He is wanted by the cost. I will have Malcom review this. I will also have him  follow-up with Dr. Martinique discussed the risks and benefits.  No intercurrent Ventricular tachycardia  We'll recheck his renal function particularly with his high-risk medications Aldactone  Euvolemic continue current meds    Current medicines are reviewed at length with the patient today .  The patient does not have concerns regarding medicines.

## 2016-12-20 NOTE — Patient Instructions (Signed)
Medication Instructions: - Your physician recommends that you continue on your current medications as directed. Please refer to the Current Medication list given to you today.  Labwork: - Your physician recommends that you have lab work today: [employer]  Procedures/Testing: - none ordered  Follow-Up: - Your physician recommends that you schedule a follow-up appointment in: 2 months with Dr. SwazilandJordan.  - Remote monitoring is used to monitor your Pacemaker of ICD from home. This monitoring reduces the number of office visits required to check your device to one time per year. It allows us to keep an eye on the functioning of your device to ensure it is working properly. You are scheduled for a device check from home on 03/21/17. You may send your transmission at any time that day. If you have a wireless device, the transmission will be sent automatically. After your physician reviews your transmission, you will receive a postcard with your next transmission date.  - Your physician wants you to follow-up in: 1 year with Gypsy BalsamAmber Seiler, NP for Dr. Graciela HusbandsKlein. You will receive a reminder letter in the mail two months in advance. If you don't receive a letter, please call our office to schedule the follow-up appointment.   Any Additional Special Instructions Will Be Listed Below (If Applicable).     If you need a refill on your cardiac medications before your next appointment, please call your pharmacy.

## 2016-12-20 NOTE — Addendum Note (Signed)
Addended bySherri Rad: MCGHEE, HEATHER C on: 12/20/2016 11:25 AM   Modules accepted: Orders

## 2016-12-25 ENCOUNTER — Telehealth: Payer: Self-pay | Admitting: Internal Medicine

## 2016-12-25 MED ORDER — MAGNESIUM OXIDE 400 (241.3 MG) MG PO TABS: 400.0000 mg | ORAL_TABLET | Freq: Two times a day (BID) | ORAL | 3 refills | Status: DC

## 2016-12-25 NOTE — Telephone Encounter (Signed)
The patient is aware of his most recent lab results.  He will increase magnesium oxide to 400 mg BID. He is aware a copy of these labs will be sent to Dr. Jacky Kindle.   He is also aware Dr. Graciela Husbands has spoken with Dr. Jacky Kindle about his facial issues (feeling like one side goes to sleep).  Per Dr. Graciela Husbands, the patient will need to call Dr. Lanell Matar office to schedule a follow up appointment with him about this. He voices understanding.

## 2016-12-25 NOTE — Telephone Encounter (Signed)
[name] is returning your call. Please call

## 2016-12-25 NOTE — Telephone Encounter (Signed)
I spoke with the patient. 

## 2017-02-01 ENCOUNTER — Other Ambulatory Visit: Payer: Self-pay | Admitting: Physician Assistant

## 2017-02-15 NOTE — Progress Notes (Signed)
[name] Date of Birth: 1956/02/16 Medical Record #[id number]  History of Present Illness: [name] is seen for follow up CAD and CHF.  He has an ischemic CM and has CRT-D in place for primary prevention since April of 2013. EF by Echo was 45-50% at that time. Cardiac MRI showed an EF of 31%. Other issues include HLD (statin intolerant and intolerant to zetia), hemochromatosis, RBBB, PAF, type 2 DM and OSA. He is s/p CABG in 2006 following an MI. Cardiac cath in February 2013 showed patrent grafts. After CRT therapy he did have a significant improvement in dyspnea.   He was admitted in October 2017 with VT and received shocks. Cardiac cath demonstrated patent grafts. He was started on sotalol. In January 2018 he presented with syncope and recurrent VT. Switched to amiodarone. Was concerned about taking amiodarone long term. Underwent VT ablation by Dr. Johney Frame on June 08, 2016. Amiodarone later discontinued. No recurrent VT to date. Last device check in September showed no VT and BiV pacing >99%.    On follow up today he states he has not returned to baseline since his last VT episode. Complains of dyspnea if he has to walk very far. No edema. Tries to do some house chores and yard work but gave up trying to [employer] his yard. Last Echo in January showed EF 40-45%.  He is intolerant of niacin, statins (Simcor and Vytorin) and Zetia. Was seen by pharmacy at [facility]. And considered for PCSK 9 inhibitor but he is really not interested in this- I think mainly due to cost and reaction to prior cholesterol meds.    Current Outpatient Medications  Medication Sig Dispense Refill  . aspirin 81 MG tablet Take 81 mg by mouth daily.    . carvedilol (COREG) 25 MG tablet Take 1 tablet (25 mg total) by mouth 2 (two) times daily with a meal. 180 tablet 3  . fenofibrate 160 MG tablet TAKE 1 TABLET BY MOUTH DAILY. 90 tablet 3  . furosemide (LASIX) 20 MG tablet TAKE 1/2 TABLET BY MOUTH DAILY. 30 tablet 3  .  lisinopril (PRINIVIL,ZESTRIL) 10 MG tablet Take 0.5 tablets (5 mg total) by mouth daily. 30 tablet 11  . magnesium oxide (MAG-OX) 400 (241.3 Mg) MG tablet Take 1 tablet (400 mg total) by mouth 2 (two) times daily. 180 tablet 3  . metFORMIN (GLUCOPHAGE) 500 MG tablet Take 1,000 mg by mouth 2 (two) times daily with a meal.     . NITROSTAT 0.4 MG SL tablet PLACE 1 TABLET UNDER THE TONGUE EVERY 5 MINUTES AS NEEDED FOR CHEST PAIN FOR UP TO 3 DOSES. 25 tablet 3  . Omega-3 Fatty Acids (RA FISH OIL) 1400 MG CPDR Take 1,400 mg by mouth 2 (two) times daily.    Letta Pate VERIO test strip 1 each by Other route as needed for other.   6  . spironolactone (ALDACTONE) 25 MG tablet TAKE 1 TABLET (25 MG TOTAL) BY MOUTH DAILY. 30 tablet 0   No current facility-administered medications for this visit.     Allergies  Allergen Reactions  . Statins Other (See Comments)    Myalgias   . Zetia [Ezetimibe] Other (See Comments)    myalgias    Past Medical History:  Diagnosis Date  . AICD (automatic cardioverter/defibrillator) present   . Anemia   . Arthritis    "mild in my joints & knuckles" (05/10/2016)  . CHF (congestive heart failure) (HCC)   . Coronary artery disease   .  Hemochromatosis   . Hyperlipidemia   . Ischemic cardiomyopathy    EF 36%; prior anterior MI, s/p CABG x 3; s/p cath Feb 2013 showing grafts to be patent with severe LV dysfunction  . Myocardial infarction (HCC) 2006  . PAF (paroxysmal atrial fibrillation) (HCC)   . Pneumonia 2000  . RBBB (right bundle branch block)   . Sleep apnea    "can't tolerate mask" (05/10/2016)  . Type II diabetes mellitus (HCC)   . Ventricular tachycardia, 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    Past Surgical History:  Procedure Laterality Date  . CORONARY ARTERY BYPASS GRAFT  2006   Emergent CABG x 3 with LIMA to LAD, SVG to OM, SVG to distal RCA per Dr. Tyrone SageGerhardt  . FRACTURE SURGERY    . INGUINAL HERNIA REPAIR Bilateral [id number]  . LIVER BIOPSY  1990s  .  SHOULDER OPEN ROTATOR CUFF REPAIR Bilateral ?2000/~ 2007   left; right   . VENTRICULAR ABLATION SURGERY  06/08/2016  . WRIST FRACTURE SURGERY Left 06/2005   "plates and screws and cadavaer bone"    Social History   Tobacco Use  Smoking Status Former Smoker  . Packs/day: 2.00  . Years: 30.00  . Pack years: 60.00  . Types: Cigarettes  . Last attempt to quit: 03/11/2005  . Years since quitting: 11.9  Smokeless Tobacco Never Used    Social History   Substance and Sexual Activity  Alcohol Use Yes  . Alcohol/week: 1.8 oz  . Types: 3 Cans of beer per week    Family History  Adopted: Yes    Review of Systems: The review of systems is per the HPI.  All other systems were reviewed and are negative.  Physical Exam: BP 115/62   Pulse 81   Ht 6' (1.829 m)   Wt 211 lb 6.4 oz (95.9 kg)   BMI 28.67 kg/m  GENERAL:  Well appearing WM in NAD HEENT:  PERRL, EOMI, sclera are clear. Oropharynx is clear. NECK:  No jugular venous distention, carotid upstroke brisk and symmetric, no bruits, no thyromegaly or adenopathy LUNGS:  Clear to auscultation bilaterally CHEST:  Unremarkable HEART:  RRR,  PMI not displaced or sustained,S1 and S2 within normal limits, no S3, no S4: no clicks, no rubs, no murmurs ABD:  Soft, nontender. BS +, no masses or bruits. No hepatomegaly, no splenomegaly EXT:  2 + pulses throughout, no edema, no cyanosis no clubbing SKIN:  Warm and dry.  No rashes NEURO:  Alert and oriented x 3. Cranial nerves II through XII intact. PSYCH:  Cognitively intact    Wt Readings from Last 3 Encounters:  02/19/17 211 lb 6.4 oz (95.9 kg)  12/20/16 208 lb (94.3 kg)  09/11/16 206 lb (93.4 kg)   LABORATORY DATA:  PENDING  Lab Results  Component Value Date   WBC 5.5 06/02/2016   HGB 12.0 (L) 06/02/2016   HCT 34.8 (L) 06/02/2016   PLT 213 06/02/2016   GLUCOSE 145 (H) 12/20/2016   CHOL 259 (H) 05/11/2016   TRIG 185 (H) 05/11/2016   HDL 39 (L) 05/11/2016   LDLDIRECT 206.0  04/14/2013   LDLCALC 183 (H) 05/11/2016   ALT 23 05/08/2016   AST 25 05/08/2016   NA 139 12/20/2016   K 4.8 12/20/2016   CL 99 12/20/2016   CREATININE 1.24 12/20/2016   BUN 14 12/20/2016   CO2 23 12/20/2016   TSH 1.880 06/02/2016   INR 1.05 01/19/2016   HGBA1C 6.0 10/06/2010   Labs dated 07/28/16: cholesterol  285, triglycerides 252, HDL 41, LDL 194.  Dated 12/13/16: A1c 6.1%.    Echo 04/13/16: Study Conclusions  - Left ventricle: The cavity size was mildly dilated. Systolic   function was mildly to moderately reduced. The estimated ejection   fraction was in the range of 40% to 45%. Diffuse hypokinesis.   Akinesis of the apical myocardium. Doppler parameters are   consistent with abnormal left ventricular relaxation (grade 1   diastolic dysfunction). Doppler parameters are consistent with   indeterminate ventricular filling pressure. - Aortic valve: Transvalvular velocity was within the normal range.   There was no stenosis. There was no regurgitation. - Mitral valve: Transvalvular velocity was within the normal range.   There was no evidence for stenosis. There was trivial   regurgitation. - Right ventricle: The cavity size was normal. Wall thickness was   normal. - Tricuspid valve: There was trivial regurgitation. - Pulmonary arteries: Systolic pressure was within the normal   range. PA peak pressure: 25 mm Hg (S).  Cardiac cath 01/19/16: Procedures   Left Heart Cath and Cors/Grafts Angiography  Conclusion     Prox LAD to Mid LAD lesion, 85 %stenosed.  Ost Ramus to Ramus lesion, 55 %stenosed.  Prox Cx to Mid Cx lesion, 100 %stenosed.  Ost RCA to Dist RCA lesion, 100 %stenosed.  SVG graft was visualized by angiography and is small.  SVG graft was visualized by angiography and is small.  LIMA graft was visualized by angiography and is large and anatomically normal.  There is moderate left ventricular systolic dysfunction.  LV end diastolic pressure is  moderately elevated.  The left ventricular ejection fraction is 35-45% by visual estimate.   1. Severe 3 vessel obstructive CAD 2. Large patent LIMA to the LAD 3. Patent SVG to OM2. This is a tiny graft supplying a very small OM  4. Patent SVG to PDA. This is also a tiny graft supplying a very tiny PDA 5. Moderate LV dysfunction. 6. Elevated LV EDP  Plan: medical therapy. Antiarrhythmic drug therapy per EP team.      Assessment / Plan: 1. Chronic systolic CHF status post CRT. EF 40-45% but I think this is a fairly generous interpretation. Clinically  class  II- III. He does not appear to be volume overloaded on exam today. I have recommended consideration of switching from lisinopril [facility]. He is interested in this and we will have him see our Pharm D to initiate and titrate. I will follow up in 3 months.  2. Ischemic cardiomyopathy.   3. Coronary disease status post CABG in 2006. Cardiac catheterization in October 2017 showed all grafts were patent. Clinically without chest pain.  4. Right bundle branch block.   5. Hyperlipidemia. Patient is intolerant to statins. He is on fenofibrate and fish oil only. Not really interested in pursuing PCSK 9 inhibitor now.   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 No recurrence. Off AAD therapy.  I will follow up in 3 months.

## 2017-02-19 ENCOUNTER — Encounter (INDEPENDENT_AMBULATORY_CARE_PROVIDER_SITE_OTHER): Payer: Self-pay

## 2017-02-19 ENCOUNTER — Encounter: Payer: Self-pay | Admitting: Cardiology

## 2017-02-19 ENCOUNTER — Ambulatory Visit: Payer: Medicare Other | Admitting: Cardiology

## 2017-02-19 VITALS — BP 115/62 | HR 81 | Ht 72.0 in | Wt 211.4 lb

## 2017-02-19 DIAGNOSIS — I472 Ventricular tachycardia, unspecified: Secondary | ICD-10-CM

## 2017-02-19 DIAGNOSIS — E7849 Other hyperlipidemia: Secondary | ICD-10-CM

## 2017-02-19 DIAGNOSIS — I4510 Unspecified right bundle-branch block: Secondary | ICD-10-CM

## 2017-02-19 DIAGNOSIS — I5022 Chronic systolic (congestive) heart failure: Secondary | ICD-10-CM

## 2017-02-19 DIAGNOSIS — I25810 Atherosclerosis of coronary artery bypass graft(s) without angina pectoris: Secondary | ICD-10-CM | POA: Diagnosis not present

## 2017-02-19 DIAGNOSIS — I255 Ischemic cardiomyopathy: Secondary | ICD-10-CM

## 2017-02-19 NOTE — Patient Instructions (Signed)
[facility]   Your physician recommends that you schedule a follow-up appointment Wed 05/23/16 at 8:00 am

## 2017-03-06 ENCOUNTER — Other Ambulatory Visit: Payer: Self-pay | Admitting: Cardiology

## 2017-03-06 NOTE — Telephone Encounter (Signed)
REFILL 

## 2017-03-08 ENCOUNTER — Ambulatory Visit (INDEPENDENT_AMBULATORY_CARE_PROVIDER_SITE_OTHER): Payer: Medicare Other | Admitting: Pharmacist

## 2017-03-08 VITALS — BP 118/64 | HR 64

## 2017-03-08 DIAGNOSIS — I5022 Chronic systolic (congestive) heart failure: Secondary | ICD-10-CM | POA: Diagnosis not present

## 2017-03-08 MED ORDER — SACUBITRIL-VALSARTAN 24-26 MG PO TABS: 1.0000 | ORAL_TABLET | Freq: Two times a day (BID) | ORAL | 0 refills | Status: DC

## 2017-03-08 MED ORDER — FUROSEMIDE 20 MG PO TABS: 10.0000 mg | ORAL_TABLET | Freq: Every day | ORAL | 3 refills | Status: DC | PRN

## 2017-03-08 MED ORDER — SACUBITRIL-VALSARTAN 24-26 MG PO TABS: 1.0000 | ORAL_TABLET | Freq: Two times a day (BID) | ORAL | 1 refills | Status: DC

## 2017-03-08 NOTE — Patient Instructions (Addendum)
Return for a  follow up appointment in 2 weeks  Your blood pressure today is 118/64 pulse 64  Check your blood pressure at home daily (if able) and keep record of the readings.  Take your BP meds as follows:  **START taking Entresto 24/26 twice daily** **HOLD furosemide** **Repeat Blood work in 2 weeks **  Exercise as you're able, try to walk approximately 30 minutes per day.  Keep salt intake to a minimum, especially watch canned and prepared boxed foods.  Eat more fresh fruits and vegetables and fewer canned items.  Avoid eating in fast food restaurants.    HOW TO TAKE YOUR BLOOD PRESSURE: . Rest 5 minutes before taking your blood pressure. .  Don't smoke or drink caffeinated beverages for at least 30 minutes before. . Take your blood pressure before (not after) you eat. . Sit comfortably with your back supported and both feet on the floor (don't cross your legs). . Elevate your arm to heart level on a table or a desk. . Use the proper sized cuff. It should fit smoothly and snugly around your bare upper arm. There should be enough room to slip a fingertip under the cuff. The bottom edge of the cuff should be 1 inch above the crease of the elbow. . Ideally, take 3 measurements at one sitting and record the average.

## 2017-03-08 NOTE — Progress Notes (Signed)
Patient ID: [name]                 DOB: 07/29/1955                      MRN: [id number]     HPI: [name] is a 61 y.o. male referred by Dr. [place] to pharmacist clinic for potential Entresto titration. PMH includes HF with reduced ejection fraction (EF of 31% on 07/01/2014) improved to 40% on ECHO 04/13/2016, hyperlipidemia, atrail fibrillation, RBBB, VTach, and diabetes. Patient denies dizziness, chest pain , swelling or increased fatigue. Reports last time he refilled lisinopril was Sep/2017 and don't know why the medication still active on his profile. His weight never changes >4lbs and denies problems with edema or shortness of breath.  Current HTN meds:  carvedilol 25mg  twice daily Furosemide 10mg  daily Spironolactone 25mg  daily  BP goal: 130/80  Family History: unknown - patient is adopted  Social History: current smoker of 2 packs/day, 3 cans of beer per week,   Home BP readings: none available but reports systolic ~115/60s  Wt Readings from Last 3 Encounters:  02/19/17 211 lb 6.4 oz (95.9 kg)  12/20/16 208 lb (94.3 kg)  09/11/16 206 lb (93.4 kg)   BP Readings from Last 3 Encounters:  03/08/17 118/64  02/19/17 115/62  12/20/16 114/60   Pulse Readings from Last 3 Encounters:  03/08/17 64  02/19/17 81  12/20/16 68    Past Medical History:  Diagnosis Date  . AICD (automatic cardioverter/defibrillator) present   . Anemia   . Arthritis    "mild in my joints & knuckles" (05/10/2016)  . CHF (congestive heart failure) (HCC)   . Coronary artery disease   . Hemochromatosis   . Hyperlipidemia   . Ischemic cardiomyopathy    EF 36%; prior anterior MI, s/p CABG x 3; s/p cath Feb 2013 showing grafts to be patent with severe LV dysfunction  . Myocardial infarction (HCC) 2006  . PAF (paroxysmal atrial fibrillation) (HCC)   . Pneumonia 2000  . RBBB (right bundle branch block)   . Sleep apnea    "can't tolerate mask" (05/10/2016)  . Type II diabetes mellitus  (HCC)   . Ventricular tachycardia, 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    Current Outpatient Medications on File Prior to Visit  Medication Sig Dispense Refill  . aspirin 81 MG tablet Take 81 mg by mouth daily.    . carvedilol (COREG) 25 MG tablet Take 1 tablet (25 mg total) by mouth 2 (two) times daily with a meal. 180 tablet 3  . fenofibrate 160 MG tablet TAKE 1 TABLET BY MOUTH DAILY. 90 tablet 3  . furosemide (LASIX) 20 MG tablet TAKE 1/2 TABLET BY MOUTH DAILY. 30 tablet 3  . magnesium oxide (MAG-OX) 400 (241.3 Mg) MG tablet Take 1 tablet (400 mg total) by mouth 2 (two) times daily. 180 tablet 3  . metFORMIN (GLUCOPHAGE) 500 MG tablet Take 1,000 mg by mouth 2 (two) times daily with a meal.     . Omega-3 Fatty Acids (RA FISH OIL) 1400 MG CPDR Take 1,400 mg by mouth 2 (two) times daily.    Letta Pate. ONETOUCH VERIO test strip 1 each by Other route as needed for other.   6  . spironolactone (ALDACTONE) 25 MG tablet TAKE 1 TABLET BY MOUTH DAILY. 30 tablet 7  . NITROSTAT 0.4 MG SL tablet PLACE 1 TABLET UNDER THE TONGUE EVERY 5 MINUTES AS NEEDED FOR CHEST PAIN FOR UP  TO 3 DOSES. (Patient not taking: Reported on 03/08/2017) 25 tablet 3   No current facility-administered medications on file prior to visit.     Allergies  Allergen Reactions  . Statins Other (See Comments)    Myalgias   . Zetia [Ezetimibe] Other (See Comments)    myalgias    Blood pressure 118/64, pulse 64, SpO2 97 %.  No problem-specific Assessment & Plan notes found for this encounter.   Carmen Tolliver Rodriguez-Guzman PharmD, BCPS, CPP [facility] [address] [place] [phone] 03/08/2017 11:02 AM

## 2017-03-08 NOTE — Assessment & Plan Note (Addendum)
Blood pressure low normal today and patient off lisinopril since Sep/2017. Patient reports prior BP ~100s/50 while on lisinopril and now ~118/60. Will initiate Entresto 24/26 twice daily today, change furosemide to PRN, monitor BP twice daily and repeat BMET in 2 weeks. Appointment for f/u in 2 weeks given today.Plan to decrease spironolactone to 1/2 tablet daily if K >/= 5  or BP <100 systolic.

## 2017-03-13 ENCOUNTER — Telehealth: Payer: Self-pay | Admitting: Cardiology

## 2017-03-13 DIAGNOSIS — I5022 Chronic systolic (congestive) heart failure: Secondary | ICD-10-CM

## 2017-03-13 DIAGNOSIS — I25810 Atherosclerosis of coronary artery bypass graft(s) without angina pectoris: Secondary | ICD-10-CM

## 2017-03-13 MED ORDER — LISINOPRIL 5 MG PO TABS: 5.0000 mg | ORAL_TABLET | Freq: Every day | ORAL | 3 refills | Status: DC

## 2017-03-13 NOTE — Telephone Encounter (Signed)
Returned call to patient.He stated he is [facility] to take [facility].Stated B/P 84/52,87/60.Stated he was dizzy and light headed.Stopped taking this past Sat 03/10/17.Stated he feels much better today B/P 116/67.Stated Raquel wanted to know if he was unable to take.Advised I will send message to her for advice.

## 2017-03-13 NOTE — Telephone Encounter (Signed)
Returned call to patient.[name]'s advice given.Advised to have bmet done 03/21/17.Lab order placed in lab box.

## 2017-03-13 NOTE — Telephone Encounter (Signed)
New message    Patient stopped taking Entresto 12/2. States medication made him feel too dizzy. Wants to consider taking Lisinopril again.  Please call    Pt c/o BP issue: STAT if pt c/o blurred vision, one-sided weakness or slurred speech  1. What are your last 5 BP readings? 112/65, 114/66, 91/57, 84/60, 82/54  2. Are you having any other symptoms (ex. Dizziness, headache, blurred vision, passed out)? Dizziness and headache  3. What is your BP issue? Felt bad while taking Entresto   Pt c/o medication issue:  1. Name of Medication: sacubitril-valsartan (ENTRESTO) 24-26 MG  2. How are you currently taking this medication (dosage and times per day)? Take 1 tablet by mouth 2 (two) times daily.  3. Are you having a reaction (difficulty breathing--STAT)? NO  4. What is your medication issue? States medication made him feel too dizzy

## 2017-03-13 NOTE — Telephone Encounter (Signed)
Okay to stop taking Entresto. Rx for lisinopril 5mg  daily sent to prefer pharmacy.  Repeat BMET  7-10 days after start lisinopril (order already in Epic).

## 2017-03-21 ENCOUNTER — Ambulatory Visit (INDEPENDENT_AMBULATORY_CARE_PROVIDER_SITE_OTHER): Payer: Medicare Other | Admitting: *Deleted

## 2017-03-21 DIAGNOSIS — I255 Ischemic cardiomyopathy: Secondary | ICD-10-CM | POA: Diagnosis not present

## 2017-03-21 LAB — BASIC METABOLIC PANEL
BUN/Creatinine Ratio: 15 (ref 10–24)
BUN: 22 mg/dL (ref 8–27)
CALCIUM: 9.5 mg/dL (ref 8.6–10.2)
CO2: 24 mmol/L (ref 20–29)
Chloride: 99 mmol/L (ref 96–106)
Creatinine, Ser: 1.48 mg/dL — ABNORMAL HIGH (ref 0.76–1.27)
GFR calc Af Amer: 59 mL/min/{1.73_m2} — ABNORMAL LOW (ref >59)
GFR, EST NON AFRICAN AMERICAN: 51 mL/min/{1.73_m2} — AB (ref >59)
GLUCOSE: 122 mg/dL — AB (ref 65–99)
POTASSIUM: 5 mmol/L (ref 3.5–5.2)
SODIUM: 138 mmol/L (ref 134–144)

## 2017-03-21 NOTE — Progress Notes (Signed)
Remote ICD transmission.   

## 2017-03-22 ENCOUNTER — Ambulatory Visit: Payer: Medicare Other

## 2017-03-23 LAB — CUP PACEART REMOTE DEVICE CHECK
Brady Statistic AP VP Percent: 21.21 %
Brady Statistic AP VS Percent: 0.03 %
Brady Statistic AS VP Percent: 78.16 %
Brady Statistic AS VS Percent: 0.61 %
HighPow Impedance: 65 Ohm
Implantable Lead Implant Date: 20130513
Implantable Lead Implant Date: 20130513
Implantable Lead Implant Date: 20130513
Implantable Lead Location: 753859
Implantable Lead Location: 753860
Implantable Lead Model: 181
Implantable Lead Model: 4396
Implantable Lead Model: 5076
Lead Channel Impedance Value: 437 Ohm
Lead Channel Impedance Value: 456 Ohm
Lead Channel Impedance Value: 494 Ohm
Lead Channel Impedance Value: 570 Ohm
Lead Channel Impedance Value: 836 Ohm
Lead Channel Pacing Threshold Amplitude: 0.875 V
Lead Channel Pacing Threshold Pulse Width: 0.4 ms
Lead Channel Pacing Threshold Pulse Width: 0.4 ms
Lead Channel Sensing Intrinsic Amplitude: 1.25 mV
Lead Channel Sensing Intrinsic Amplitude: 17 mV
Lead Channel Setting Pacing Pulse Width: 0.4 ms
MDC IDC LEAD LOCATION: 753858
MDC IDC LEAD SERIAL: 316784
MDC IDC MSMT BATTERY REMAINING LONGEVITY: 72 mo
MDC IDC MSMT BATTERY VOLTAGE: 2.99 V
MDC IDC MSMT LEADCHNL RA SENSING INTR AMPL: 1.25 mV
MDC IDC MSMT LEADCHNL RV IMPEDANCE VALUE: 456 Ohm
MDC IDC MSMT LEADCHNL RV PACING THRESHOLD AMPLITUDE: 0.75 V
MDC IDC MSMT LEADCHNL RV SENSING INTR AMPL: 17 mV
MDC IDC PG IMPLANT DT: 20160304
MDC IDC SESS DTM: 20181212072821
MDC IDC SET LEADCHNL RA PACING AMPLITUDE: 2 V
MDC IDC SET LEADCHNL RV PACING AMPLITUDE: 2.5 V
MDC IDC SET LEADCHNL RV SENSING SENSITIVITY: 0.45 mV
MDC IDC STAT BRADY RA PERCENT PACED: 20.9 %
MDC IDC STAT BRADY RV PERCENT PACED: 97.51 %

## 2017-03-27 ENCOUNTER — Encounter: Payer: Self-pay | Admitting: Cardiology

## 2017-05-20 NOTE — Progress Notes (Signed)
[name] Date of Birth: [birth year] Medical Record #[id number]  History of Present Illness: [name] is seen for follow up CAD and CHF.  He has an ischemic CM and has CRT-D in place for primary prevention since April of 2013. EF by Echo was 45-50% at that time. Cardiac MRI showed an EF of 31%. Other issues include HLD (statin intolerant and intolerant to zetia), hemochromatosis, RBBB, PAF, type 2 DM and OSA. He is s/p CABG in 2006 following an MI. Cardiac cath in February 2013 showed patrent grafts. After CRT therapy he did have a significant improvement in dyspnea.   He was admitted in October 2017 with VT and received shocks. Cardiac cath demonstrated patent grafts. He was started on sotalol. In January 2018 he presented with syncope and recurrent VT. Switched to amiodarone. Was concerned about taking amiodarone long term. Underwent VT ablation by Dr. Johney Frame on June 08, 2016. Amiodarone later discontinued.  Last device check in Decmber showed 3 episodes of VT that were pace terminated. Approx. 15 runs of NSVT.  Last Echo in January 2018 showed EF 40-45%.  He is intolerant of niacin, statins (Simcor and Vytorin) and Zetia. Was seen by pharmacy at [facility]. And considered for PCSK 9 inhibitor but he is really not interested in this- I think mainly due to cost and reaction to prior cholesterol meds.   On his last visit we attempted to initiate him on Entresto. Lisinopril was washed out. Lasix changed to prn. Even at lower dose he was unable to tolerate Entresto due to hypotension with pressures in the 80s systolic and symptoms of lightheadedness and dizziness. Lisinopril resumed.   On follow up today he is doing well. No symptomatic VT. He is doing a lot of splitting and stacking wood. Notes he wears out more quickly when doing strenuous exertion but otherwise denies any SOB or chest pain. No edema.    Current Outpatient Medications  Medication Sig Dispense Refill  . aspirin 81 MG tablet Take  81 mg by mouth daily.    . carvedilol (COREG) 25 MG tablet Take 1 tablet (25 mg total) by mouth 2 (two) times daily with a meal. 180 tablet 3  . fenofibrate 160 MG tablet TAKE 1 TABLET BY MOUTH DAILY. 90 tablet 3  . furosemide (LASIX) 20 MG tablet Take 0.5 tablets (10 mg total) by mouth daily as needed. 30 tablet 3  . lisinopril (PRINIVIL,ZESTRIL) 5 MG tablet Take 1 tablet (5 mg total) by mouth daily. After stopping Entresto 30 tablet 3  . magnesium oxide (MAG-OX) 400 (241.3 Mg) MG tablet Take 1 tablet (400 mg total) by mouth 2 (two) times daily. 180 tablet 3  . metFORMIN (GLUCOPHAGE) 500 MG tablet Take 1,000 mg by mouth 2 (two) times daily with a meal.     . NITROSTAT 0.4 MG SL tablet PLACE 1 TABLET UNDER THE TONGUE EVERY 5 MINUTES AS NEEDED FOR CHEST PAIN FOR UP TO 3 DOSES. 25 tablet 3  . Omega-3 Fatty Acids (RA FISH OIL) 1400 MG CPDR Take 1,400 mg by mouth 2 (two) times daily.    Letta Pate VERIO test strip 1 each by Other route as needed for other.   6  . spironolactone (ALDACTONE) 25 MG tablet TAKE 1 TABLET BY MOUTH DAILY. 30 tablet 7   No current facility-administered medications for this visit.     Allergies  Allergen Reactions  . Statins Other (See Comments)    Myalgias   . Zetia [Ezetimibe] Other (See  Comments)    myalgias    Past Medical History:  Diagnosis Date  . AICD (automatic cardioverter/defibrillator) present   . Anemia   . Arthritis    "mild in my joints & knuckles" (05/10/2016)  . CHF (congestive heart failure) (HCC)   . Coronary artery disease   . Hemochromatosis   . Hyperlipidemia   . Ischemic cardiomyopathy    EF 36%; prior anterior MI, s/p CABG x 3; s/p cath Feb 2013 showing grafts to be patent with severe LV dysfunction  . Myocardial infarction (HCC) 2006  . PAF (paroxysmal atrial fibrillation) (HCC)   . Pneumonia 2000  . RBBB (right bundle branch block)   . Sleep apnea    "can't tolerate mask" (05/10/2016)  . Type II diabetes mellitus (HCC)   .  Ventricular tachycardia, 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    Past Surgical History:  Procedure Laterality Date  . BI-VENTRICULAR IMPLANTABLE CARDIOVERTER DEFIBRILLATOR N/A 08/21/2011   Procedure: BI-VENTRICULAR IMPLANTABLE CARDIOVERTER DEFIBRILLATOR  (CRT-D);  Surgeon: Duke SalviaSteven C Klein, MD;  [facility];  Service: Cardiovascular;  Laterality: N/A;  . BIV ICD GENERTAOR CHANGE OUT N/A 06/12/2014   Procedure: BIV ICD GENERTAOR CHANGE OUT;  Surgeon: Duke SalviaSteven C Klein, MD;  [facility];  Service: Cardiovascular;  Laterality: N/A;  . CARDIAC CATHETERIZATION N/A 01/19/2016   Procedure: Left Heart Cath and Cors/Grafts Angiography;  Surgeon: Breelle Hollywood M SwazilandJordan, MD;  [facility];  Service: Cardiovascular;  Laterality: N/A;  . CORONARY ARTERY BYPASS GRAFT  2006   Emergent CABG x 3 with LIMA to LAD, SVG to OM, SVG to distal RCA per Dr. Tyrone SageGerhardt  . FRACTURE SURGERY    . INGUINAL HERNIA REPAIR  02/09/2012   Procedure: HERNIA REPAIR INGUINAL ADULT;  Surgeon: Cherylynn RidgesJames O Wyatt, MD;  [facility];  Service: General;  Laterality: Right;  . INGUINAL HERNIA REPAIR Bilateral [id number]  . INSERTION OF MESH  02/09/2012   Procedure: INSERTION OF MESH;  Surgeon: Cherylynn RidgesJames O Wyatt, MD;  [facility];  Service: General;  Laterality: Right;  . LEFT HEART CATHETERIZATION WITH CORONARY/GRAFT ANGIOGRAM N/A 05/25/2011   Procedure: LEFT HEART CATHETERIZATION WITH Isabel CapriceORONARY/GRAFT ANGIOGRAM;  Surgeon: Hosie Sharman M SwazilandJordan, MD;  [facility];  Service: Cardiovascular;  Laterality: N/A;  . LIVER BIOPSY  1990s  . SHOULDER OPEN ROTATOR CUFF REPAIR Bilateral ?2000/~ 2007   left; right   . V TACH ABLATION N/A 06/08/2016   Procedure: V Tach Ablation;  Surgeon: Hillis RangeJames Allred, MD;  [facility] CV LAB;  Service: Cardiovascular;  Laterality: N/A;  . VENTRICULAR ABLATION SURGERY  06/08/2016  . WRIST FRACTURE SURGERY Left 06/2005   "plates and screws and cadavaer bone"    Social History   Tobacco Use    Smoking Status Former Smoker  . Packs/day: 2.00  . Years: 30.00  . Pack years: 60.00  . Types: Cigarettes  . Last attempt to quit: 03/11/2005  . Years since quitting: 12.2  Smokeless Tobacco Never Used    Social History   Substance and Sexual Activity  Alcohol Use Yes  . Alcohol/week: 1.8 oz  . Types: 3 Cans of beer per week    Family History  Adopted: Yes    Review of Systems: The review of systems is per the HPI.  All other systems were reviewed and are negative.  Physical Exam: BP 116/71   Pulse 87   Ht 6' (1.829 m)   Wt 212 lb 3.2 oz (96.3 kg)   SpO2  95%   BMI 28.78 kg/m  GENERAL:  Well appearing WM in NAD HEENT:  PERRL, EOMI, sclera are clear. Oropharynx is clear. NECK:  No jugular venous distention, carotid upstroke brisk and symmetric, no bruits, no thyromegaly or adenopathy LUNGS:  Clear to auscultation bilaterally CHEST:  Unremarkable HEART:  RRR,  PMI not displaced or sustained,S1 and S2 within normal limits, no S3, no S4: no clicks, no rubs, no murmurs ABD:  Soft, nontender. BS +, no masses or bruits. No hepatomegaly, no splenomegaly EXT:  2 + pulses throughout, no edema, no cyanosis no clubbing SKIN:  Warm and dry.  No rashes NEURO:  Alert and oriented x 3. Cranial nerves II through XII intact. PSYCH:  Cognitively intact       Wt Readings from Last 3 Encounters:  05/23/17 212 lb 3.2 oz (96.3 kg)  02/19/17 211 lb 6.4 oz (95.9 kg)  12/20/16 208 lb (94.3 kg)   LABORATORY DATA:  PENDING  Lab Results  Component Value Date   WBC 5.5 06/02/2016   HGB 12.0 (L) 06/02/2016   HCT 34.8 (L) 06/02/2016   PLT 213 06/02/2016   GLUCOSE 122 (H) 03/21/2017   CHOL 259 (H) 05/11/2016   TRIG 185 (H) 05/11/2016   HDL 39 (L) 05/11/2016   LDLDIRECT 206.0 04/14/2013   LDLCALC 183 (H) 05/11/2016   ALT 23 05/08/2016   AST 25 05/08/2016   NA 138 03/21/2017   K 5.0 03/21/2017   CL 99 03/21/2017   CREATININE 1.48 (H) 03/21/2017   BUN 22 03/21/2017   CO2 24  03/21/2017   TSH 1.880 06/02/2016   INR 1.05 01/19/2016   HGBA1C 6.0 10/06/2010   Labs dated 07/28/16: cholesterol 285, triglycerides 252, HDL 41, LDL 194.  Dated 12/13/16: A1c 6.1%.    Echo 04/13/16: Study Conclusions  - Left ventricle: The cavity size was mildly dilated. Systolic   function was mildly to moderately reduced. The estimated ejection   fraction was in the range of 40% to 45%. Diffuse hypokinesis.   Akinesis of the apical myocardium. Doppler parameters are   consistent with abnormal left ventricular relaxation (grade 1   diastolic dysfunction). Doppler parameters are consistent with   indeterminate ventricular filling pressure. - Aortic valve: Transvalvular velocity was within the normal range.   There was no stenosis. There was no regurgitation. - Mitral valve: Transvalvular velocity was within the normal range.   There was no evidence for stenosis. There was trivial   regurgitation. - Right ventricle: The cavity size was normal. Wall thickness was   normal. - Tricuspid valve: There was trivial regurgitation. - Pulmonary arteries: Systolic pressure was within the normal   range. PA peak pressure: 25 mm Hg (S).  Cardiac cath 01/19/16: Procedures   Left Heart Cath and Cors/Grafts Angiography  Conclusion     Prox LAD to Mid LAD lesion, 85 %stenosed.  Ost Ramus to Ramus lesion, 55 %stenosed.  Prox Cx to Mid Cx lesion, 100 %stenosed.  Ost RCA to Dist RCA lesion, 100 %stenosed.  SVG graft was visualized by angiography and is small.  SVG graft was visualized by angiography and is small.  LIMA graft was visualized by angiography and is large and anatomically normal.  There is moderate left ventricular systolic dysfunction.  LV end diastolic pressure is moderately elevated.  The left ventricular ejection fraction is 35-45% by visual estimate.   1. Severe 3 vessel obstructive CAD 2. Large patent LIMA to the LAD 3. Patent SVG to OM2. This is  a tiny graft  supplying a very small OM  4. Patent SVG to PDA. This is also a tiny graft supplying a very tiny PDA 5. Moderate LV dysfunction. 6. Elevated LV EDP  Plan: medical therapy. Antiarrhythmic drug therapy per EP team.      Assessment / Plan: 1. Chronic systolic CHF status post CRT. EF 40-45% but I think this is a fairly generous interpretation. Clinically  class  II. He was unable to tolerate Entresto. Thoracic impedence in December was normal. Continue current therapy.  2. Ischemic cardiomyopathy.   3. Coronary disease status post CABG in 2006. Cardiac catheterization in October 2017 showed all grafts were patent. Clinically without chest pain.  4. Right bundle branch block.   5. Hyperlipidemia. Patient is intolerant to statins. He is on fenofibrate and fish oil only. Not really interested in pursuing PCSK 9 inhibitor now. Labs checked by primary care.   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  Off AAD therapy. ICD check in December showed 3 episodes of VT that were pace terminated and some runs of NSVT. No symptomatic episodes. Device check planned next month.  I will follow up in 6 months.

## 2017-05-23 ENCOUNTER — Ambulatory Visit: Payer: Medicare Other | Admitting: Cardiology

## 2017-05-23 ENCOUNTER — Encounter: Payer: Self-pay | Admitting: Cardiology

## 2017-05-23 VITALS — BP 116/71 | HR 87 | Ht 72.0 in | Wt 212.2 lb

## 2017-05-23 DIAGNOSIS — E7849 Other hyperlipidemia: Secondary | ICD-10-CM

## 2017-05-23 DIAGNOSIS — I472 Ventricular tachycardia, unspecified: Secondary | ICD-10-CM

## 2017-05-23 DIAGNOSIS — I5022 Chronic systolic (congestive) heart failure: Secondary | ICD-10-CM

## 2017-05-23 DIAGNOSIS — I25810 Atherosclerosis of coronary artery bypass graft(s) without angina pectoris: Secondary | ICD-10-CM

## 2017-05-23 NOTE — Patient Instructions (Signed)
Continue your current therapy  I will see you in 6 months.   

## 2017-06-20 ENCOUNTER — Ambulatory Visit (INDEPENDENT_AMBULATORY_CARE_PROVIDER_SITE_OTHER): Payer: Medicare Other | Admitting: *Deleted

## 2017-06-20 ENCOUNTER — Telehealth: Payer: Self-pay

## 2017-06-20 DIAGNOSIS — I472 Ventricular tachycardia, unspecified: Secondary | ICD-10-CM

## 2017-06-20 DIAGNOSIS — I5022 Chronic systolic (congestive) heart failure: Secondary | ICD-10-CM

## 2017-06-20 NOTE — Progress Notes (Signed)
Remote ICD transmission.   

## 2017-06-20 NOTE — Telephone Encounter (Signed)
Spoke with pt regarding ATP episode from 04/16/2017 that transmitted on scheduled remote 06/20/17. Informed pt of VT episode and how the device treated him, pt voiced understanding of this and understanding of DMV restrictions, informed pt that he would receive a call if Dr.Klein had recommended medication changes or an OV. Pt voiced understanding

## 2017-06-21 ENCOUNTER — Encounter: Payer: Self-pay | Admitting: Cardiology

## 2017-06-28 LAB — CUP PACEART REMOTE DEVICE CHECK
Battery Remaining Longevity: 70 mo
Brady Statistic AP VP Percent: 15.64 %
Brady Statistic AP VS Percent: 0.05 %
Brady Statistic AS VP Percent: 83.31 %
Brady Statistic AS VS Percent: 1 %
Date Time Interrogation Session: 20190313083723
HIGH POWER IMPEDANCE MEASURED VALUE: 60 Ohm
Implantable Lead Implant Date: 20130513
Implantable Lead Location: 753860
Lead Channel Impedance Value: 437 Ohm
Lead Channel Impedance Value: 456 Ohm
Lead Channel Impedance Value: 456 Ohm
Lead Channel Impedance Value: 855 Ohm
Lead Channel Pacing Threshold Amplitude: 0.875 V
Lead Channel Pacing Threshold Pulse Width: 0.4 ms
Lead Channel Sensing Intrinsic Amplitude: 1.875 mV
Lead Channel Setting Sensing Sensitivity: 0.45 mV
MDC IDC LEAD IMPLANT DT: 20130513
MDC IDC LEAD IMPLANT DT: 20130513
MDC IDC LEAD LOCATION: 753858
MDC IDC LEAD LOCATION: 753859
MDC IDC LEAD SERIAL: 316784
MDC IDC MSMT BATTERY VOLTAGE: 2.99 V
MDC IDC MSMT LEADCHNL LV IMPEDANCE VALUE: 570 Ohm
MDC IDC MSMT LEADCHNL RA PACING THRESHOLD PULSEWIDTH: 0.4 ms
MDC IDC MSMT LEADCHNL RA SENSING INTR AMPL: 1.875 mV
MDC IDC MSMT LEADCHNL RV IMPEDANCE VALUE: 494 Ohm
MDC IDC MSMT LEADCHNL RV PACING THRESHOLD AMPLITUDE: 0.75 V
MDC IDC MSMT LEADCHNL RV SENSING INTR AMPL: 13.5 mV
MDC IDC MSMT LEADCHNL RV SENSING INTR AMPL: 13.5 mV
MDC IDC PG IMPLANT DT: 20160304
MDC IDC SET LEADCHNL RA PACING AMPLITUDE: 2 V
MDC IDC SET LEADCHNL RV PACING AMPLITUDE: 2.5 V
MDC IDC SET LEADCHNL RV PACING PULSEWIDTH: 0.4 ms
MDC IDC STAT BRADY RA PERCENT PACED: 15.47 %
MDC IDC STAT BRADY RV PERCENT PACED: 96.97 %

## 2017-07-02 ENCOUNTER — Other Ambulatory Visit: Payer: Self-pay | Admitting: Cardiology

## 2017-07-19 ENCOUNTER — Telehealth: Payer: Self-pay | Admitting: Cardiology

## 2017-07-19 MED ORDER — FENOFIBRATE 160 MG PO TABS: 160.0000 mg | ORAL_TABLET | Freq: Every day | ORAL | 3 refills | Status: DC

## 2017-07-19 MED ORDER — FUROSEMIDE 20 MG PO TABS: 10.0000 mg | ORAL_TABLET | Freq: Every day | ORAL | 6 refills | Status: DC | PRN

## 2017-07-19 MED ORDER — LISINOPRIL 5 MG PO TABS: ORAL_TABLET | ORAL | 3 refills | Status: DC

## 2017-07-19 MED ORDER — SPIRONOLACTONE 25 MG PO TABS: 25.0000 mg | ORAL_TABLET | Freq: Every day | ORAL | 3 refills | Status: DC

## 2017-07-19 NOTE — Telephone Encounter (Signed)
New Message:    Needs authorization to do 90 days supply on her chronic medication please.

## 2017-07-19 NOTE — Telephone Encounter (Signed)
E- sent 5 prescription as requested per pharmacy.

## 2017-08-07 ENCOUNTER — Other Ambulatory Visit: Payer: Self-pay | Admitting: Cardiology

## 2017-09-19 ENCOUNTER — Ambulatory Visit (INDEPENDENT_AMBULATORY_CARE_PROVIDER_SITE_OTHER): Payer: Medicare Other | Admitting: *Deleted

## 2017-09-19 DIAGNOSIS — I255 Ischemic cardiomyopathy: Secondary | ICD-10-CM

## 2017-09-19 NOTE — Progress Notes (Signed)
Remote ICD transmission.   

## 2017-09-20 ENCOUNTER — Encounter: Payer: Self-pay | Admitting: Cardiology

## 2017-09-20 NOTE — Progress Notes (Signed)
Letter  

## 2017-09-24 LAB — CUP PACEART REMOTE DEVICE CHECK
Battery Voltage: 2.99 V
Brady Statistic RA Percent Paced: 16.67 %
HIGH POWER IMPEDANCE MEASURED VALUE: 70 Ohm
Implantable Lead Implant Date: 20130513
Implantable Lead Location: 753859
Implantable Lead Model: 181
Implantable Lead Model: 4396
Implantable Lead Serial Number: 316784
Lead Channel Impedance Value: 513 Ohm
Lead Channel Impedance Value: 646 Ohm
Lead Channel Impedance Value: 969 Ohm
Lead Channel Pacing Threshold Pulse Width: 0.4 ms
Lead Channel Sensing Intrinsic Amplitude: 1.5 mV
Lead Channel Sensing Intrinsic Amplitude: 13.125 mV
Lead Channel Sensing Intrinsic Amplitude: 13.125 mV
Lead Channel Setting Pacing Amplitude: 2 V
Lead Channel Setting Pacing Pulse Width: 0.4 ms
Lead Channel Setting Sensing Sensitivity: 0.45 mV
MDC IDC LEAD IMPLANT DT: 20130513
MDC IDC LEAD IMPLANT DT: 20130513
MDC IDC LEAD LOCATION: 753858
MDC IDC LEAD LOCATION: 753860
MDC IDC MSMT BATTERY REMAINING LONGEVITY: 68 mo
MDC IDC MSMT LEADCHNL LV IMPEDANCE VALUE: 456 Ohm
MDC IDC MSMT LEADCHNL RA IMPEDANCE VALUE: 456 Ohm
MDC IDC MSMT LEADCHNL RA PACING THRESHOLD AMPLITUDE: 0.875 V
MDC IDC MSMT LEADCHNL RA SENSING INTR AMPL: 1.5 mV
MDC IDC MSMT LEADCHNL RV IMPEDANCE VALUE: 494 Ohm
MDC IDC MSMT LEADCHNL RV PACING THRESHOLD AMPLITUDE: 0.75 V
MDC IDC MSMT LEADCHNL RV PACING THRESHOLD PULSEWIDTH: 0.4 ms
MDC IDC PG IMPLANT DT: 20160304
MDC IDC SESS DTM: 20190612052205
MDC IDC SET LEADCHNL RV PACING AMPLITUDE: 2.5 V
MDC IDC STAT BRADY AP VP PERCENT: 16.83 %
MDC IDC STAT BRADY AP VS PERCENT: 0.03 %
MDC IDC STAT BRADY AS VP PERCENT: 82.41 %
MDC IDC STAT BRADY AS VS PERCENT: 0.74 %
MDC IDC STAT BRADY RV PERCENT PACED: 97.2 %

## 2017-10-25 ENCOUNTER — Other Ambulatory Visit: Payer: Self-pay | Admitting: Internal Medicine

## 2017-12-19 ENCOUNTER — Ambulatory Visit (INDEPENDENT_AMBULATORY_CARE_PROVIDER_SITE_OTHER): Payer: Medicare Other | Admitting: *Deleted

## 2017-12-19 DIAGNOSIS — I255 Ischemic cardiomyopathy: Secondary | ICD-10-CM

## 2017-12-19 NOTE — Progress Notes (Signed)
Remote ICD transmission.   

## 2017-12-31 ENCOUNTER — Other Ambulatory Visit: Payer: Self-pay | Admitting: Internal Medicine

## 2017-12-31 NOTE — Telephone Encounter (Signed)
This is Dr. Jordan's pt. °

## 2018-01-09 LAB — CUP PACEART REMOTE DEVICE CHECK
Brady Statistic AP VP Percent: 18.01 %
Brady Statistic AP VS Percent: 0.02 %
Brady Statistic AS VP Percent: 81.24 %
Brady Statistic RA Percent Paced: 17.73 %
HighPow Impedance: 66 Ohm
Implantable Lead Implant Date: 20130513
Implantable Lead Implant Date: 20130513
Implantable Lead Location: 753858
Implantable Lead Location: 753859
Implantable Lead Location: 753860
Implantable Lead Model: 4396
Implantable Lead Model: 5076
Implantable Lead Serial Number: 316784
Lead Channel Impedance Value: 437 Ohm
Lead Channel Impedance Value: 456 Ohm
Lead Channel Impedance Value: 456 Ohm
Lead Channel Impedance Value: 589 Ohm
Lead Channel Impedance Value: 855 Ohm
Lead Channel Pacing Threshold Amplitude: 0.875 V
Lead Channel Pacing Threshold Pulse Width: 0.4 ms
Lead Channel Pacing Threshold Pulse Width: 0.4 ms
Lead Channel Sensing Intrinsic Amplitude: 1.75 mV
Lead Channel Sensing Intrinsic Amplitude: 1.75 mV
Lead Channel Sensing Intrinsic Amplitude: 12.875 mV
MDC IDC LEAD IMPLANT DT: 20130513
MDC IDC MSMT BATTERY REMAINING LONGEVITY: 64 mo
MDC IDC MSMT BATTERY VOLTAGE: 2.98 V
MDC IDC MSMT LEADCHNL RV IMPEDANCE VALUE: 494 Ohm
MDC IDC MSMT LEADCHNL RV PACING THRESHOLD AMPLITUDE: 0.625 V
MDC IDC MSMT LEADCHNL RV SENSING INTR AMPL: 12.875 mV
MDC IDC PG IMPLANT DT: 20160304
MDC IDC SESS DTM: 20190911083724
MDC IDC SET LEADCHNL RA PACING AMPLITUDE: 2 V
MDC IDC SET LEADCHNL RV PACING AMPLITUDE: 2.5 V
MDC IDC SET LEADCHNL RV PACING PULSEWIDTH: 0.4 ms
MDC IDC SET LEADCHNL RV SENSING SENSITIVITY: 0.45 mV
MDC IDC STAT BRADY AS VS PERCENT: 0.73 %
MDC IDC STAT BRADY RV PERCENT PACED: 96.15 %

## 2018-01-22 ENCOUNTER — Other Ambulatory Visit: Payer: Self-pay | Admitting: Internal Medicine

## 2018-01-30 NOTE — Progress Notes (Signed)
This encounter was created in error - please disregard.

## 2018-03-04 ENCOUNTER — Ambulatory Visit: Payer: Medicare Other | Admitting: Internal Medicine

## 2018-03-04 ENCOUNTER — Encounter: Payer: Self-pay | Admitting: Internal Medicine

## 2018-03-04 VITALS — BP 112/68 | HR 73 | Ht 72.0 in | Wt 210.6 lb

## 2018-03-04 DIAGNOSIS — I472 Ventricular tachycardia, unspecified: Secondary | ICD-10-CM

## 2018-03-04 DIAGNOSIS — I5022 Chronic systolic (congestive) heart failure: Secondary | ICD-10-CM

## 2018-03-04 DIAGNOSIS — I255 Ischemic cardiomyopathy: Secondary | ICD-10-CM

## 2018-03-04 LAB — CUP PACEART INCLINIC DEVICE CHECK
Battery Remaining Longevity: 60 mo
Battery Voltage: 2.98 V
Brady Statistic AP VP Percent: 18.86 %
Brady Statistic AP VS Percent: 0.03 %
Brady Statistic AS VP Percent: 80.34 %
Brady Statistic AS VS Percent: 0.76 %
Date Time Interrogation Session: 20191125171839
HIGH POWER IMPEDANCE MEASURED VALUE: 71 Ohm
Implantable Lead Implant Date: 20130513
Implantable Lead Location: 753858
Implantable Lead Location: 753860
Implantable Lead Serial Number: 316784
Lead Channel Impedance Value: 456 Ohm
Lead Channel Impedance Value: 494 Ohm
Lead Channel Impedance Value: 513 Ohm
Lead Channel Impedance Value: 513 Ohm
Lead Channel Impedance Value: 646 Ohm
Lead Channel Impedance Value: 969 Ohm
Lead Channel Pacing Threshold Amplitude: 0.75 V
Lead Channel Pacing Threshold Pulse Width: 0.4 ms
Lead Channel Pacing Threshold Pulse Width: 0.4 ms
Lead Channel Sensing Intrinsic Amplitude: 1.75 mV
Lead Channel Sensing Intrinsic Amplitude: 13.125 mV
Lead Channel Setting Pacing Amplitude: 2 V
Lead Channel Setting Pacing Pulse Width: 0.4 ms
MDC IDC LEAD IMPLANT DT: 20130513
MDC IDC LEAD IMPLANT DT: 20130513
MDC IDC LEAD LOCATION: 753859
MDC IDC MSMT LEADCHNL RA SENSING INTR AMPL: 1.25 mV
MDC IDC MSMT LEADCHNL RV PACING THRESHOLD AMPLITUDE: 0.625 V
MDC IDC MSMT LEADCHNL RV SENSING INTR AMPL: 8.5 mV
MDC IDC PG IMPLANT DT: 20160304
MDC IDC SET LEADCHNL RV PACING AMPLITUDE: 2.5 V
MDC IDC SET LEADCHNL RV SENSING SENSITIVITY: 0.45 mV
MDC IDC STAT BRADY RA PERCENT PACED: 18.56 %
MDC IDC STAT BRADY RV PERCENT PACED: 96.55 %

## 2018-03-04 MED ORDER — CARVEDILOL 25 MG PO TABS: 25.0000 mg | ORAL_TABLET | Freq: Two times a day (BID) | ORAL | 3 refills | Status: DC

## 2018-03-04 NOTE — Progress Notes (Signed)
Patient Care Team: Burnard Bunting, MD as PCP - General (Internal Medicine)   HPI  [name] is a 62 y.o. male Seen in followup for an CRT-D implanted for primary prevention April 2013 for ischemic cardiomyopathy and prior myocardial infarction and right bundle branch block. He underwent device generator reimplantation 2/16  He was admitted 10/17 w VT storm-- noted to be mildly hypokalemic; Aldactone and Mag started. Nitrates added for possible ischemic trigger m Sotalol added   He is modestly improved following CRT. He is less winded. He still develops fatigue. He has daytime somnolence and obstructive sleep apnea. He's had a sleep study but is not inclined to CPAP therapy. There is been no significant change in functional status following the off programming of his LV lead  DATE TEST EF   3/13 echo  40-45%   3/16 echo  30-35%   10/17 cath  35-40% LIMA/SVG OM2-PDA -- patent , severe native disease  1/18 Echo  40-45%     Date Cr Mg K LDL  10.13   1.9       11/17  1.49   4.8   3/18 1.67 1.4 4.3 183  12/18 1.48  5.0    He had recurrent ventricular tachycardia despite the sotalol. He underwent catheter ablation by Dr. Greggory Brandy 3/18.  Sotalol now discontinued.  The patient denies chest pain, nocturnal dyspnea, orthopnea or peripheral edema.  There have been no palpitations, lightheadedness or syncope.    Shortness of breath with inclines   He has met with PhD Supple re PCSK 9.  Declined    Past Medical History:  Diagnosis Date  . AICD (automatic cardioverter/defibrillator) present   . Anemia   . Arthritis    "mild in my joints & knuckles" (05/10/2016)  . CHF (congestive heart failure) ([place])   . Coronary artery disease   . Hemochromatosis   . Hyperlipidemia   . Ischemic cardiomyopathy    EF 36%; prior anterior MI, s/p CABG x 3; s/p cath Feb 2013 showing grafts to be patent with severe LV dysfunction  . Myocardial infarction ([place]) 2006  . PAF  (paroxysmal atrial fibrillation) ([place])   . Pneumonia 2000  . RBBB (right bundle branch block)   . Sleep apnea    "can't tolerate mask" (05/10/2016)  . Type II diabetes mellitus ([place])   . Ventricular tachycardia, sustained ([place]) 01/19/2016    Past Surgical History:  Procedure Laterality Date  . BI-VENTRICULAR IMPLANTABLE CARDIOVERTER DEFIBRILLATOR N/A 08/21/2011   Procedure: BI-VENTRICULAR IMPLANTABLE CARDIOVERTER DEFIBRILLATOR  (CRT-D);  Surgeon: Deboraha Sprang, MD;  Location: [facility] CATH LAB;  Service: Cardiovascular;  Laterality: N/A;  . BIV ICD GENERTAOR CHANGE OUT N/A 06/12/2014   Procedure: BIV ICD GENERTAOR CHANGE OUT;  Surgeon: Deboraha Sprang, MD;  Location[facility] CATH LAB;  Service: Cardiovascular;  Laterality: N/A;  . CARDIAC CATHETERIZATION N/A 01/19/2016   Procedure: Left Heart Cath and Cors/Grafts Angiography;  Surgeon: Peter M Martinique, MD;  Location: [place] CV LAB;  Service: Cardiovascular;  Laterality: N/A;  . CORONARY ARTERY BYPASS GRAFT  2006   Emergent CABG x 3 with LIMA to LAD, SVG to OM, SVG to distal RCA per Dr. Servando Snare  . FRACTURE SURGERY    . INGUINAL HERNIA REPAIR  02/09/2012   Procedure: HERNIA REPAIR INGUINAL ADULT;  Surgeon: Gwenyth Ober, MD;  Location: [place];  Service: General;  Laterality: Right;  . INGUINAL HERNIA REPAIR Bilateral [id number]  . INSERTION OF MESH  02/09/2012   Procedure: INSERTION OF MESH;  Surgeon: Gwenyth Ober, MD;  Location: [place];  Service: General;  Laterality: Right;  . LEFT HEART CATHETERIZATION WITH CORONARY/GRAFT ANGIOGRAM N/A 05/25/2011   Procedure: LEFT HEART CATHETERIZATION WITH Beatrix Fetters;  Surgeon: Peter M Martinique, MD;  Location: [facility] CATH LAB;  Service: Cardiovascular;  Laterality: N/A;  . LIVER BIOPSY  1990s  . SHOULDER OPEN ROTATOR CUFF REPAIR Bilateral ?2000/~ 2007   left; right   . V TACH ABLATION N/A 06/08/2016   Procedure: V Tach Ablation;  Surgeon: Thompson Grayer, MD;  Location: [place] CV LAB;  Service:  Cardiovascular;  Laterality: N/A;  . VENTRICULAR ABLATION SURGERY  06/08/2016  . WRIST FRACTURE SURGERY Left 06/2005   "plates and screws and cadavaer bone"    Current Outpatient Medications  Medication Sig Dispense Refill  . aspirin 81 MG tablet Take 81 mg by mouth daily.    . carvedilol (COREG) 25 MG tablet Take 1 tablet (25 mg total) by mouth 2 (two) times daily with a meal. 180 tablet 3  . fenofibrate 160 MG tablet Take 1 tablet (160 mg total) by mouth daily. 90 tablet 3  . furosemide (LASIX) 20 MG tablet TAKE 1/2 TABLET BY MOUTH DAILY. 30 tablet 3  . lisinopril (PRINIVIL,ZESTRIL) 5 MG tablet TAKE 1 TABLET BY MOUTH DAILY. AFTER STOPPING ENTRESTO 90 tablet 3  . magnesium oxide (MAG-OX) 400 (241.3 Mg) MG tablet TAKE 1 TABLET BY MOUTH 2 TIMES DAILY. 180 tablet 0  . metFORMIN (GLUCOPHAGE) 500 MG tablet Take 1,000 mg by mouth 2 (two) times daily with a meal.     . NITROSTAT 0.4 MG SL tablet PLACE 1 TABLET UNDER THE TONGUE EVERY 5 MINUTES AS NEEDED FOR CHEST PAIN FOR UP TO 3 DOSES. 25 tablet 3  . Omega-3 Fatty Acids (RA FISH OIL) 1400 MG CPDR Take 1,400 mg by mouth 2 (two) times daily.    Glory Rosebush VERIO test strip 1 each by Other route as needed for other.   6  . spironolactone (ALDACTONE) 25 MG tablet Take 1 tablet (25 mg total) by mouth daily. 90 tablet 3   No current facility-administered medications for this visit.     Allergies  Allergen Reactions  . Statins Other (See Comments)    Myalgias   . Zetia [Ezetimibe] Other (See Comments)    myalgias      Review of Systems negative except from HPI and PMH  Physical Exam BP 112/68   Pulse 73   Ht 6' (1.829 m)   Wt 210 lb 9.6 oz (95.5 kg)   SpO2 97%   BMI 28.56 kg/m  Well developed and nourished in no acute distress HENT normal Neck supple with JVP-flat Clear Regular rate and rhythm, no murmurs or gallops Abd-soft with active BS No Clubbing cyanosis edema Skin-warm and dry A & Oriented  Grossly normal sensory and  motor function   ECG demonstrates AV pacing  Assessment and  Plan  Ischemic cardiomyopathy  VT storm s/p ablation   CRT-Medtronic The patient's device was interrogated and the information was fully reviewed. The device was reprogrammed to Maximize longevity  RBBB  Congestive heart failure-chronic systolic  High output LV capture threshold  Renal insuff grade 3  Hyperlipidemia  Hypomagnesemia   Without symptoms of ischemia  No intercurrent Ventricular tachycardia  Euvolemic continue current meds  Not tolerant of entresto 2/2 hypotension or statins even infrequently  HgbA1c is < 6.5    Current medicines are reviewed  at length with the patient today .  The patient does not have concerns regarding medicines.

## 2018-03-04 NOTE — Patient Instructions (Signed)
Medication Instructions:  Your physician recommends that you continue on your current medications as directed. Please refer to the Current Medication list given to you today.  Labwork: You will have labs drawn today: BMP and Mg  Testing/Procedures: None ordered.  Follow-Up:  Your physician recommends that you schedule a follow-up appointment in:   One Year with Dr Graciela HusbandsKlein   Any Other Special Instructions Will Be Listed Below (If Applicable).     If you need a refill on your cardiac medications before your next appointment, please call your pharmacy.

## 2018-03-05 LAB — MAGNESIUM: Magnesium: 1.8 mg/dL (ref 1.6–2.3)

## 2018-03-05 LAB — BASIC METABOLIC PANEL
BUN / CREAT RATIO: 17 (ref 10–24)
BUN: 22 mg/dL (ref 8–27)
CHLORIDE: 102 mmol/L (ref 96–106)
CO2: 20 mmol/L (ref 20–29)
Calcium: 9.9 mg/dL (ref 8.6–10.2)
Creatinine, Ser: 1.29 mg/dL — ABNORMAL HIGH (ref 0.76–1.27)
GFR, EST AFRICAN AMERICAN: 69 mL/min/{1.73_m2} (ref >59)
GFR, EST NON AFRICAN AMERICAN: 59 mL/min/{1.73_m2} — AB (ref >59)
Glucose: 116 mg/dL — ABNORMAL HIGH (ref 65–99)
POTASSIUM: 4.6 mmol/L (ref 3.5–5.2)
SODIUM: 139 mmol/L (ref 134–144)

## 2018-03-20 ENCOUNTER — Ambulatory Visit (INDEPENDENT_AMBULATORY_CARE_PROVIDER_SITE_OTHER): Payer: Medicare Other

## 2018-03-20 DIAGNOSIS — I472 Ventricular tachycardia, unspecified: Secondary | ICD-10-CM

## 2018-03-20 DIAGNOSIS — I255 Ischemic cardiomyopathy: Secondary | ICD-10-CM | POA: Diagnosis not present

## 2018-03-21 NOTE — Progress Notes (Signed)
Remote ICD transmission.   

## 2018-03-22 ENCOUNTER — Encounter: Payer: Self-pay | Admitting: Cardiology

## 2018-04-02 ENCOUNTER — Other Ambulatory Visit: Payer: Self-pay | Admitting: Internal Medicine

## 2018-05-04 LAB — CUP PACEART REMOTE DEVICE CHECK
Battery Remaining Longevity: 58 mo
Battery Voltage: 2.97 V
Brady Statistic AP VP Percent: 16.05 %
Brady Statistic AP VS Percent: 0.02 %
Brady Statistic AS VP Percent: 83.31 %
Brady Statistic AS VS Percent: 0.62 %
Brady Statistic RA Percent Paced: 15.83 %
Brady Statistic RV Percent Paced: 96.54 %
Date Time Interrogation Session: 20191211092306
HighPow Impedance: 65 Ohm
Implantable Lead Implant Date: 20130513
Implantable Lead Implant Date: 20130513
Implantable Lead Implant Date: 20130513
Implantable Lead Location: 753858
Implantable Lead Location: 753859
Implantable Lead Location: 753860
Implantable Lead Model: 181
Implantable Lead Model: 4396
Implantable Lead Model: 5076
Implantable Lead Serial Number: 316784
Implantable Pulse Generator Implant Date: 20160304
Lead Channel Impedance Value: 399 Ohm
Lead Channel Impedance Value: 456 Ohm
Lead Channel Impedance Value: 494 Ohm
Lead Channel Impedance Value: 494 Ohm
Lead Channel Impedance Value: 627 Ohm
Lead Channel Pacing Threshold Amplitude: 0.75 V
Lead Channel Pacing Threshold Amplitude: 0.75 V
Lead Channel Pacing Threshold Pulse Width: 0.4 ms
Lead Channel Pacing Threshold Pulse Width: 0.4 ms
Lead Channel Sensing Intrinsic Amplitude: 12.375 mV
Lead Channel Sensing Intrinsic Amplitude: 12.375 mV
Lead Channel Sensing Intrinsic Amplitude: 2.25 mV
Lead Channel Sensing Intrinsic Amplitude: 2.25 mV
Lead Channel Setting Pacing Amplitude: 2 V
Lead Channel Setting Pacing Amplitude: 2.5 V
Lead Channel Setting Sensing Sensitivity: 0.45 mV
MDC IDC MSMT LEADCHNL LV IMPEDANCE VALUE: 855 Ohm
MDC IDC SET LEADCHNL RV PACING PULSEWIDTH: 0.4 ms

## 2018-06-19 ENCOUNTER — Other Ambulatory Visit: Payer: Self-pay

## 2018-06-19 ENCOUNTER — Ambulatory Visit (INDEPENDENT_AMBULATORY_CARE_PROVIDER_SITE_OTHER): Payer: Medicare Other | Admitting: *Deleted

## 2018-06-19 DIAGNOSIS — I5022 Chronic systolic (congestive) heart failure: Secondary | ICD-10-CM

## 2018-06-19 DIAGNOSIS — I255 Ischemic cardiomyopathy: Secondary | ICD-10-CM | POA: Diagnosis not present

## 2018-06-19 LAB — CUP PACEART REMOTE DEVICE CHECK
Battery Remaining Longevity: 53 mo
Battery Voltage: 2.98 V
Brady Statistic AP VS Percent: 0.03 %
Brady Statistic AS VP Percent: 84.11 %
Brady Statistic AS VS Percent: 0.91 %
Brady Statistic RA Percent Paced: 14.7 %
Brady Statistic RV Percent Paced: 96.1 %
Date Time Interrogation Session: 20200311073525
HighPow Impedance: 64 Ohm
Implantable Lead Implant Date: 20130513
Implantable Lead Implant Date: 20130513
Implantable Lead Implant Date: 20130513
Implantable Lead Location: 753858
Implantable Lead Location: 753859
Implantable Lead Location: 753860
Implantable Lead Model: 181
Implantable Lead Model: 4396
Implantable Lead Model: 5076
Implantable Lead Serial Number: 316784
Implantable Pulse Generator Implant Date: 20160304
Lead Channel Impedance Value: 437 Ohm
Lead Channel Impedance Value: 456 Ohm
Lead Channel Impedance Value: 456 Ohm
Lead Channel Impedance Value: 494 Ohm
Lead Channel Impedance Value: 570 Ohm
Lead Channel Impedance Value: 836 Ohm
Lead Channel Pacing Threshold Amplitude: 0.875 V
Lead Channel Pacing Threshold Amplitude: 0.875 V
Lead Channel Pacing Threshold Pulse Width: 0.4 ms
Lead Channel Sensing Intrinsic Amplitude: 1.625 mV
Lead Channel Sensing Intrinsic Amplitude: 1.625 mV
Lead Channel Sensing Intrinsic Amplitude: 27.75 mV
Lead Channel Sensing Intrinsic Amplitude: 27.75 mV
Lead Channel Setting Pacing Amplitude: 2 V
Lead Channel Setting Pacing Pulse Width: 0.4 ms
Lead Channel Setting Sensing Sensitivity: 0.45 mV
MDC IDC MSMT LEADCHNL RV PACING THRESHOLD PULSEWIDTH: 0.4 ms
MDC IDC SET LEADCHNL RV PACING AMPLITUDE: 2.5 V
MDC IDC STAT BRADY AP VP PERCENT: 14.95 %

## 2018-06-26 NOTE — Progress Notes (Signed)
Remote ICD transmission.   

## 2018-07-26 ENCOUNTER — Other Ambulatory Visit: Payer: Self-pay | Admitting: Cardiology

## 2018-07-26 NOTE — Telephone Encounter (Signed)
Fenofibrate 160 mg refilled 

## 2018-07-29 ENCOUNTER — Telehealth: Payer: Self-pay

## 2018-07-29 DIAGNOSIS — I472 Ventricular tachycardia, unspecified: Secondary | ICD-10-CM

## 2018-07-29 NOTE — Telephone Encounter (Signed)
-----   Message from Duke Salvia, MD sent at 07/28/2018  8:51 PM EDT ----- Remote reviewed. This remote is abnormal for frequent VT NS and one episode of treated VT/-fast which terminated with ATP during charging  Please arrange Telehealth visit    And will need labs Bmet mag

## 2018-07-29 NOTE — Telephone Encounter (Signed)
Pt has agreed with lab draw tomorrow and virtual visit with Dr Graciela Husbands on 4/22. He was advised on [place] driving restrictions and verbalized understanding.

## 2018-07-30 ENCOUNTER — Other Ambulatory Visit: Payer: Self-pay

## 2018-07-30 ENCOUNTER — Other Ambulatory Visit: Payer: Medicare Other

## 2018-07-30 DIAGNOSIS — I472 Ventricular tachycardia, unspecified: Secondary | ICD-10-CM

## 2018-07-30 LAB — BASIC METABOLIC PANEL
BUN/Creatinine Ratio: 16 (ref 10–24)
BUN: 24 mg/dL (ref 8–27)
CO2: 19 mmol/L — ABNORMAL LOW (ref 20–29)
Calcium: 9.9 mg/dL (ref 8.6–10.2)
Chloride: 100 mmol/L (ref 96–106)
Creatinine, Ser: 1.5 mg/dL — ABNORMAL HIGH (ref 0.76–1.27)
GFR calc Af Amer: 57 mL/min/{1.73_m2} — ABNORMAL LOW (ref >59)
GFR calc non Af Amer: 49 mL/min/{1.73_m2} — ABNORMAL LOW (ref >59)
Glucose: 195 mg/dL — ABNORMAL HIGH (ref 65–99)
Potassium: 4.6 mmol/L (ref 3.5–5.2)
Sodium: 137 mmol/L (ref 134–144)

## 2018-07-30 LAB — MAGNESIUM: Magnesium: 1.7 mg/dL (ref 1.6–2.3)

## 2018-07-31 ENCOUNTER — Telehealth (INDEPENDENT_AMBULATORY_CARE_PROVIDER_SITE_OTHER): Payer: Medicare Other | Admitting: Internal Medicine

## 2018-07-31 ENCOUNTER — Encounter: Payer: Self-pay | Admitting: Internal Medicine

## 2018-07-31 VITALS — BP 110/68 | HR 66 | Ht 72.0 in | Wt 204.8 lb

## 2018-07-31 DIAGNOSIS — I4510 Unspecified right bundle-branch block: Secondary | ICD-10-CM

## 2018-07-31 DIAGNOSIS — I472 Ventricular tachycardia, unspecified: Secondary | ICD-10-CM

## 2018-07-31 DIAGNOSIS — I255 Ischemic cardiomyopathy: Secondary | ICD-10-CM

## 2018-07-31 DIAGNOSIS — Z95810 Presence of automatic (implantable) cardiac defibrillator: Secondary | ICD-10-CM

## 2018-07-31 MED ORDER — AMIODARONE HCL 200 MG PO TABS: ORAL_TABLET | ORAL | 0 refills | Status: DC

## 2018-07-31 NOTE — Progress Notes (Signed)
Electrophysiology TeleHealth Note   Due to national recommendations of social distancing due to COVID 19, an audio/video telehealth visit is felt to be most appropriate for this patient at this time.  See MyChart message from today for the patient's consent to telehealth for [facility].   Date:  07/31/2018   ID:  [name], DOB February 21, 1956, MRN [id number]  Location: patient's home  Provider location: [address], [place]  Evaluation Performed: Follow-up visit  PCP:  Geoffry Paradise, MD  Cardiologist:   PJ  Electrophysiologist:  SK   Chief Complaint:  Ventricular tachycardia with an implanted ICD  History of Present Illness:    [name] is a 63 y.o. male who presents via audio/video conferencing for a telehealth visit today.  Since last being seen in our clinic for VT, ICD with CRT-ICD ( LV lead off)  the patient reports doing really quite well.  No chest pain.  Modest dyspnea on exertion.  He has been able to shovel mulch and albeit slow pace.  No peripheral edema.  He had a recurrent episode over the weekend of abrupt short lasting lightheadedness; this correlated with nonsustained ventricular tachycardia.  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  Treated with amiodarone.  Ablation-JA-7/18.  With significant improvement thereafter.   DATE TEST EF   3/13 echo  40-45%   3/16 echo  30-35%   10/17 cath  35-40% LIMA/SVG OM2-PDA -- patent , severe native disease  1/18 Echo  40-45%     Date Cr Mg K LDL  10.13   1.9     11/17  1.49  4.8   3/18 1.67 1.4 4.3 183  12/18 1.48  5.0      The patient denies symptoms of fevers, chills, cough, or new SOB worrisome for COVID 19. *   Past Medical History:  Diagnosis Date  . AICD (automatic cardioverter/defibrillator) present   . Anemia   . Arthritis    "mild in my joints & knuckles" (05/10/2016)  . CHF (congestive heart failure) (HCC)   . Coronary artery disease   .  Hemochromatosis   . Hyperlipidemia   . Ischemic cardiomyopathy    EF 36%; prior anterior MI, s/p CABG x 3; s/p cath Feb 2013 showing grafts to be patent with severe LV dysfunction  . Myocardial infarction (HCC) 2006  . PAF (paroxysmal atrial fibrillation) (HCC)   . Pneumonia 2000  . RBBB (right bundle branch block)   . Sleep apnea    "can't tolerate mask" (05/10/2016)  . Type II diabetes mellitus (HCC)   . Ventricular tachycardia, 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    Past Surgical History:  Procedure Laterality Date  . BI-VENTRICULAR IMPLANTABLE CARDIOVERTER DEFIBRILLATOR N/A 08/21/2011   Procedure: BI-VENTRICULAR IMPLANTABLE CARDIOVERTER DEFIBRILLATOR  (CRT-D);  Surgeon: Duke Salvia, MD;  Location: [facility] CATH LAB;  Service: Cardiovascular;  Laterality: N/A;  . BIV ICD GENERTAOR CHANGE OUT N/A 06/12/2014   Procedure: BIV ICD GENERTAOR CHANGE OUT;  Surgeon: Duke Salvia, MD;  Location: [facility] CATH LAB;  Service: Cardiovascular;  Laterality: N/A;  . CARDIAC CATHETERIZATION N/A 01/19/2016   Procedure: Left Heart Cath and Cors/Grafts Angiography;  Surgeon: Peter M Swaziland, MD;  Location: [facility] INVASIVE CV LAB;  Service: Cardiovascular;  Laterality: N/A;  . CORONARY ARTERY BYPASS GRAFT  2006   Emergent CABG x 3 with LIMA to LAD, SVG to OM, SVG to distal RCA per Dr. Tyrone Sage  . FRACTURE SURGERY    .  INGUINAL HERNIA REPAIR  02/09/2012   Procedure: HERNIA REPAIR INGUINAL ADULT;  Surgeon: Cherylynn Ridges, MD;  Location: [facility] OR;  Service: General;  Laterality: Right;  . INGUINAL HERNIA REPAIR Bilateral [id number]  . INSERTION OF MESH  02/09/2012   Procedure: INSERTION OF MESH;  Surgeon: Cherylynn Ridges, MD;  Location: [facility] OR;  Service: General;  Laterality: Right;  . LEFT HEART CATHETERIZATION WITH CORONARY/GRAFT ANGIOGRAM N/A 05/25/2011   Procedure: LEFT HEART CATHETERIZATION WITH Isabel Caprice;  Surgeon: Peter M Swaziland, MD;  Location: [facility] CATH LAB;  Service: Cardiovascular;  Laterality: N/A;  . LIVER BIOPSY   1990s  . SHOULDER OPEN ROTATOR CUFF REPAIR Bilateral ?2000/~ 2007   left; right   . V TACH ABLATION N/A 06/08/2016   Procedure: V Tach Ablation;  Surgeon: Hillis Range, MD;  Location: [facility] INVASIVE CV LAB;  Service: Cardiovascular;  Laterality: N/A;  . VENTRICULAR ABLATION SURGERY  06/08/2016  . WRIST FRACTURE SURGERY Left 06/2005   "plates and screws and cadavaer bone"    Current Outpatient Medications  Medication Sig Dispense Refill  . aspirin 81 MG tablet Take 81 mg by mouth daily.    . carvedilol (COREG) 25 MG tablet Take 1 tablet (25 mg total) by mouth 2 (two) times daily with a meal. 180 tablet 3  . fenofibrate 160 MG tablet TAKE 1 TABLET (160 MG TOTAL) BY MOUTH DAILY. 90 tablet 3  . furosemide (LASIX) 20 MG tablet TAKE 1/2 TABLET BY MOUTH DAILY. 30 tablet 3  . lisinopril (PRINIVIL,ZESTRIL) 5 MG tablet TAKE 1 TABLET BY MOUTH DAILY. AFTER STOPPING ENTRESTO 90 tablet 3  . magnesium oxide (MAG-OX) 400 (241.3 Mg) MG tablet TAKE 1 TABLET BY MOUTH 2 TIMES DAILY. 180 tablet 0  . metFORMIN (GLUCOPHAGE) 500 MG tablet Take 1,000 mg by mouth 2 (two) times daily with a meal.     . NITROSTAT 0.4 MG SL tablet PLACE 1 TABLET UNDER THE TONGUE EVERY 5 MINUTES AS NEEDED FOR CHEST PAIN FOR UP TO 3 DOSES. 25 tablet 3  . Omega-3 Fatty Acids (RA FISH OIL) 1400 MG CPDR Take 1,400 mg by mouth 2 (two) times daily.    Letta Pate VERIO test strip 1 each by Other route as needed for other.   6  . spironolactone (ALDACTONE) 25 MG tablet Take 1 tablet (25 mg total) by mouth daily. 90 tablet 3   No current facility-administered medications for this visit.     Allergies:   Entresto [sacubitril-valsartan]; Statins; and Zetia [ezetimibe]   Social History:  The patient  reports that he quit smoking about 13 years ago. His smoking use included cigarettes. He has a 60.00 pack-year smoking history. He has never used smokeless tobacco. He reports current alcohol use of about 3.0 standard drinks of alcohol per week. He  reports that he does not use drugs.   Family History:  The patient's   family history is not on file. He was adopted.   ROS:  Please see the history of present illness.   All other systems are personally reviewed and negative.    Exam:    Vital Signs:  BP 110/68   Pulse 66   Ht 6' (1.829 m)   Wt 204 lb 12.8 oz (92.9 kg)   BMI 27.78 kg/m    Well appearing, alert and conversant, regular work of breathing,  good skin color Eyes- anicteric, neuro- grossly intact, skin- no apparent rash or lesions or cyanosis, mouth- oral mucosa is pink   Labs/Other  Tests and Data Reviewed:    Recent Labs: 07/30/2018: BUN 24; Creatinine, Ser 1.50; Magnesium 1.7; Potassium 4.6; Sodium 137   Wt Readings from Last 3 Encounters:  07/31/18 204 lb 12.8 oz (92.9 kg)  03/04/18 210 lb 9.6 oz (95.5 kg)  05/23/17 212 lb 3.2 oz (96.3 kg)     Other studies personally reviewed: Additional studies/ records that were reviewed today include:  As above   Review of the above records today demonstrates: As above  Prior radiographs: CXR to look for LV lead   Only 2V is pre implant      Last device remote is reviewed from PaceART PDF dated 3/20 which reveals normal device function,   arrhythmias - treated ATP while charging for fast monomorphic VT;   Strips from this week are noted in the chart has VTNS assoc with symptoms..As above    ASSESSMENT & PLAN:    Ischemic cardiomyopathy  VT storm s/p ablation   Amiodarone prior to ablation   Ventricular tachycardia-treated and non-sustained-symptomatic  CRT-Medtronic The patient's device was interrogated and the information was fully reviewed. The device was reprogrammed to Maximize longevity  RBBB  Congestive heart failure-chronic systolic  High output LV capture threshold  Renal insuff grade 3  Hyperlipidemia  Entresto intolerant  Hypomagnesemia   Euvolemic.  Heart failure status stable.  Medications are largely maximized.   Recurrent symptomatic ventricular tachycardia both treated and nonsustained.  We will resume amiodarone.  200 mg twice daily for 4 weeks and then 200 mg a day.  We will plan to check comprehensive profile and TSH in 4 weeks.  I will see the patient in 3 months.  His LDL is very elevated; he is agreeable to talking to the risk factor reduction clinic about cost and potential side effects of PCSK9 inhibitor therapy    COVID 19 screen The patient denies symptoms of COVID 19 at this time.  The importance of social distancing was discussed today.  Follow-up: 4 weeks  Next remote: now  Current medicines are reviewed at length with the patient today.   The patient has concerns regarding his medicines.  The following changes were made today:  As above Amiodarone 200 mg bid x 4 weeks Amiodarone 200 mg daily   Labs/ tests ordered today include: As above  No orders of the defined types were placed in this encounter.   Future tests ( post COVID )  Labs As above   Patient Risk:  after full review of this patients clinical status, I feel that they are at moderate risk at this time.  Today, I have spent 19 minutes with the patient with telehealth technology discussing the above.  Signed, Sherryl MangesSteven Khaniya Tenaglia, MD  07/31/2018 9:47 AM     [facility] [address] [place] [address] [phone] (office) [phone] (fax)

## 2018-07-31 NOTE — Patient Instructions (Signed)
Called and reviewed Dr Odessa Fleming recommendations with pt.  Medication Instructions:  Your physician has recommended you make the following change in your medication:   Begin Amiodarone.  200mg  tablet, twice daily, for 4 weeks. Then begin 200mg  tablet once daily.  Labwork: Your physician recommends that you return for lab work in: 4 weeks for a TSH and LFT  Testing/Procedures: None ordered.   Follow-Up: Your physician recommends that you schedule a follow-up appointment June 2 @ 2:30pm with Dr. Graciela Husbands  Dr Graciela Husbands has also made a referral to Dr Rennis Golden for his lipid clinic.   Pt verbalized understanding and agrees with the recommendations above.      If you need a refill on your cardiac medications before your next appointment, please call your pharmacy.

## 2018-08-06 ENCOUNTER — Other Ambulatory Visit: Payer: Self-pay | Admitting: Cardiology

## 2018-08-12 ENCOUNTER — Telehealth: Payer: Self-pay | Admitting: Cardiology

## 2018-08-12 MED ORDER — SPIRONOLACTONE 25 MG PO TABS: 25.0000 mg | ORAL_TABLET | Freq: Every day | ORAL | 3 refills | Status: DC

## 2018-08-12 NOTE — Telephone Encounter (Signed)
New message   Pt c/o medication issue:  1. Name of Medication:  spironolactone (ALDACTONE) 25 MG tablet    2. How are you currently taking this medication (dosage and times per day)?1 time daily  3. Are you having a reaction (difficulty breathing--STAT)? No   4. What is your medication issue? Patient states that he needs a new prescription sent to [facility] in [place], [place].

## 2018-08-12 NOTE — Telephone Encounter (Signed)
Called pharmacy -  Reviewing  information -  interscript completed 08/06/18  pharmacy has not received information   Verbal given Judeth Cornfield  to refill spironolactone 25 mg  one tablet  Daily #90 x3 refills.   patient aware . appt made for 12/26/18

## 2018-09-03 ENCOUNTER — Other Ambulatory Visit: Payer: Medicare Other

## 2018-09-03 ENCOUNTER — Other Ambulatory Visit: Payer: Self-pay

## 2018-09-03 ENCOUNTER — Encounter (INDEPENDENT_AMBULATORY_CARE_PROVIDER_SITE_OTHER): Payer: Self-pay

## 2018-09-03 DIAGNOSIS — I472 Ventricular tachycardia, unspecified: Secondary | ICD-10-CM

## 2018-09-03 DIAGNOSIS — I255 Ischemic cardiomyopathy: Secondary | ICD-10-CM

## 2018-09-03 DIAGNOSIS — Z95810 Presence of automatic (implantable) cardiac defibrillator: Secondary | ICD-10-CM

## 2018-09-03 DIAGNOSIS — I4510 Unspecified right bundle-branch block: Secondary | ICD-10-CM

## 2018-09-03 LAB — HEPATIC FUNCTION PANEL
ALT: 16 IU/L (ref 0–44)
AST: 15 IU/L (ref 0–40)
Albumin: 4.4 g/dL (ref 3.8–4.8)
Alkaline Phosphatase: 31 IU/L — ABNORMAL LOW (ref 39–117)
Bilirubin Total: 0.5 mg/dL (ref 0.0–1.2)
Bilirubin, Direct: 0.2 mg/dL (ref 0.00–0.40)
Total Protein: 6.3 g/dL (ref 6.0–8.5)

## 2018-09-03 LAB — TSH: TSH: 2.97 u[IU]/mL (ref 0.450–4.500)

## 2018-09-06 ENCOUNTER — Telehealth: Payer: Self-pay | Admitting: Internal Medicine

## 2018-09-06 NOTE — Telephone Encounter (Signed)
lmtcb - dr Graciela Husbands has referred patient to Lipid Clinic with Dr. Rennis Golden.  Please schedule on a lipid clinic day. Thanks

## 2018-09-10 ENCOUNTER — Telehealth (INDEPENDENT_AMBULATORY_CARE_PROVIDER_SITE_OTHER): Payer: Medicare Other | Admitting: Internal Medicine

## 2018-09-10 ENCOUNTER — Other Ambulatory Visit: Payer: Self-pay

## 2018-09-10 ENCOUNTER — Encounter: Payer: Self-pay | Admitting: Internal Medicine

## 2018-09-10 VITALS — BP 110/67 | HR 70 | Ht 72.0 in | Wt 205.0 lb

## 2018-09-10 DIAGNOSIS — I255 Ischemic cardiomyopathy: Secondary | ICD-10-CM

## 2018-09-10 DIAGNOSIS — Z95810 Presence of automatic (implantable) cardiac defibrillator: Secondary | ICD-10-CM

## 2018-09-10 DIAGNOSIS — I472 Ventricular tachycardia, unspecified: Secondary | ICD-10-CM

## 2018-09-10 DIAGNOSIS — I4510 Unspecified right bundle-branch block: Secondary | ICD-10-CM

## 2018-09-10 DIAGNOSIS — R251 Tremor, unspecified: Secondary | ICD-10-CM

## 2018-09-10 NOTE — Progress Notes (Signed)
Electrophysiology TeleHealth Note   Due to national recommendations of social distancing due to COVID 19, an audio/video telehealth visit is felt to be most appropriate for this patient at this time.  See MyChart message from today for the patient's consent to telehealth for [facility].   Date:  09/10/2018   ID:  [name], DOB 1955/09/01, MRN [id number]  Location: patient's home  Provider location: [address], [place]  Evaluation Performed: Follow-up visit  PCP:  Geoffry Paradise, MD  Cardiologist:   PJ Electrophysiologist:  SK   Chief Complaint:  ICM Ventricular tachycardia   History of Present Illness:    [name] is a 63 y.o. male who presents via audio/video conferencing for a telehealth visit today. The patient did not have access to video technology/had technical difficulties with video requiring transitioning to audio format only (telephone).  All issues noted in this document were discussed and addressed.  No physical exam could be performed with this format.      He has CRT-D for primary prevention with subsequent VT storm and managed now wi AMIO, previously wi adjunctive sotalol and s/p ablation.  ICM s/p CABG  Since last being seen in our clinic, the patient reports doing relatively well; most things are stable.   DATE TEST EF   3/13 echo  40-45%   3/16 echo  30-35%   10/17 cath  35-40% LIMA/SVG OM2-PDA -- patent , severe native disease  1/18 Echo  40-45%     Date Cr Mg K LDL  10.13   1.9     11/17  1.49  4.8   3/18 1.67 1.4 4.3 183  12/18 1.48  5.0   4/20 1.50 1.7 4.6   5/20 TSH 2.97AST 16  He had recurrent ventricular tachycardia despite the sotalol. He underwent catheter ablation by Dr. Fawn Kirk 3/18.  Sotalol discontinued and then to amiodarone   Patient denies symptoms of GI intolerance, sun sensitivity, He does have neurological symptoms incl tremors sleep disturbance--seemingly aggravation of baseline  issues  Modest DOE but not intruding on ADL. No CP or edema; orthopnea, PND    The patient denies symptoms of fevers, chills, cough, or new SOB worrisome for COVID 19.    Past Medical History:  Diagnosis Date  . AICD (automatic cardioverter/defibrillator) present   . Anemia   . Arthritis    "mild in my joints & knuckles" (05/10/2016)  . CHF (congestive heart failure) (HCC)   . Coronary artery disease   . Hemochromatosis   . Hyperlipidemia   . Ischemic cardiomyopathy    EF 36%; prior anterior MI, s/p CABG x 3; s/p cath Feb 2013 showing grafts to be patent with severe LV dysfunction  . Myocardial infarction (HCC) 2006  . PAF (paroxysmal atrial fibrillation) (HCC)   . Pneumonia 2000  . RBBB (right bundle branch block)   . Sleep apnea    "can't tolerate mask" (05/10/2016)  . Type II diabetes mellitus (HCC)   . Ventricular tachycardia, 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    Past Surgical History:  Procedure Laterality Date  . BI-VENTRICULAR IMPLANTABLE CARDIOVERTER DEFIBRILLATOR N/A 08/21/2011   Procedure: BI-VENTRICULAR IMPLANTABLE CARDIOVERTER DEFIBRILLATOR  (CRT-D);  Surgeon: Duke Salvia, MD;  Location: [facility] CATH LAB;  Service: Cardiovascular;  Laterality: N/A;  . BIV ICD GENERTAOR CHANGE OUT N/A 06/12/2014   Procedure: BIV ICD GENERTAOR CHANGE OUT;  Surgeon: Duke Salvia, MD;  Location: [facility] CATH LAB;  Service: Cardiovascular;  Laterality: N/A;  .  CARDIAC CATHETERIZATION N/A 01/19/2016   Procedure: Left Heart Cath and Cors/Grafts Angiography;  Surgeon: Peter M SwazilandJordan, MD;  [facility];  Service: Cardiovascular;  Laterality: N/A;  . CORONARY ARTERY BYPASS GRAFT  2006   Emergent CABG x 3 with LIMA to LAD, SVG to OM, SVG to distal RCA per Dr. Tyrone SageGerhardt  . FRACTURE SURGERY    . INGUINAL HERNIA REPAIR  02/09/2012   Procedure: HERNIA REPAIR INGUINAL ADULT;  Surgeon: Cherylynn RidgesJames O Wyatt, MD;  [facility];  Service: General;  Laterality: Right;  . INGUINAL HERNIA REPAIR Bilateral  [id number]  . INSERTION OF MESH  02/09/2012   Procedure: INSERTION OF MESH;  Surgeon: Cherylynn RidgesJames O Wyatt, MD;  [facility];  Service: General;  Laterality: Right;  . LEFT HEART CATHETERIZATION WITH CORONARY/GRAFT ANGIOGRAM N/A 05/25/2011   Procedure: LEFT HEART CATHETERIZATION WITH Isabel CapriceORONARY/GRAFT ANGIOGRAM;  Surgeon: Peter M SwazilandJordan, MD;  [facility];  Service: Cardiovascular;  Laterality: N/A;  . LIVER BIOPSY  1990s  . SHOULDER OPEN ROTATOR CUFF REPAIR Bilateral ?2000/~ 2007   left; right   . V TACH ABLATION N/A 06/08/2016   Procedure: V Tach Ablation;  Surgeon: Hillis RangeJames Allred, MD;  Location: [facility] INVASIVE CV LAB;  Service: Cardiovascular;  Laterality: N/A;  . VENTRICULAR ABLATION SURGERY  06/08/2016  . WRIST FRACTURE SURGERY Left 06/2005   "plates and screws and cadavaer bone"    Current Outpatient Medications  Medication Sig Dispense Refill  . amiodarone (PACERONE) 200 MG tablet Take 200 mg by mouth daily.    Marland Kitchen. aspirin 81 MG tablet Take 81 mg by mouth daily.    . carvedilol (COREG) 25 MG tablet Take 1 tablet (25 mg total) by mouth 2 (two) times daily with a meal. 180 tablet 3  . fenofibrate 160 MG tablet TAKE 1 TABLET (160 MG TOTAL) BY MOUTH DAILY. 90 tablet 3  . furosemide (LASIX) 20 MG tablet TAKE 1/2 TABLET BY MOUTH DAILY. 30 tablet 3  . lisinopril (PRINIVIL,ZESTRIL) 5 MG tablet TAKE 1 TABLET BY MOUTH DAILY. AFTER STOPPING ENTRESTO 90 tablet 3  . magnesium oxide (MAG-OX) 400 (241.3 Mg) MG tablet TAKE 1 TABLET BY MOUTH 2 TIMES DAILY. 180 tablet 0  . metFORMIN (GLUCOPHAGE) 500 MG tablet Take 1,000 mg by mouth 2 (two) times daily with a meal.     . NITROSTAT 0.4 MG SL tablet PLACE 1 TABLET UNDER THE TONGUE EVERY 5 MINUTES AS NEEDED FOR CHEST PAIN FOR UP TO 3 DOSES. 25 tablet 3  . Omega-3 Fatty Acids (RA FISH OIL) 1400 MG CPDR Take 1,400 mg by mouth 2 (two) times daily.    Letta Pate. ONETOUCH VERIO test strip 1 each by Other route as needed for other.   6  . spironolactone (ALDACTONE) 25 MG  tablet Take 1 tablet (25 mg total) by mouth daily. 90 tablet 3   No current facility-administered medications for this visit.     Allergies:   Entresto [sacubitril-valsartan]; Statins; and Zetia [ezetimibe]   Social History:  The patient  reports that he quit smoking about 13 years ago. His smoking use included cigarettes. He has a 60.00 pack-year smoking history. He has never used smokeless tobacco. He reports current alcohol use of about 3.0 standard drinks of alcohol per week. He reports that he does not use drugs.   Family History:  The patient's   family history is not on file. He was adopted.   ROS:  Please see the history of present illness.  All other systems are personally reviewed and negative.    Exam:    Vital Signs:  BP 110/67   Pulse 70   Ht 6' (1.829 m)   Wt 205 lb (93 kg)   BMI 27.80 kg/m     Well appearing, alert and conversant, regular work of breathing,  good skin color Eyes- anicteric, neuro- grossly intact, skin- no apparent rash or lesions or cyanosis, mouth- oral mucosa is pink   Labs/Other Tests and Data Reviewed:    Recent Labs: 07/30/2018: BUN 24; Creatinine, Ser 1.50; Magnesium 1.7; Potassium 4.6; Sodium 137 09/03/2018: ALT 16; TSH 2.970   Wt Readings from Last 3 Encounters:  09/10/18 205 lb (93 kg)  07/31/18 204 lb 12.8 oz (92.9 kg)  03/04/18 210 lb 9.6 oz (95.5 kg)     Other studies personally reviewed: Additional studies/ records that were reviewed today include: *     Last device remote is reviewed from PaceART PDF dated 3/20 which reveals normal device function,   arrhythmias - Ventricular tachy with ATP    ASSESSMENT & PLAN:    Ischemic cardiomyopathy  VT storm s/p ablation Now recurrent   CRT-Medtronic       RBBB  Congestive heart failure-chronic systolic  High output LV capture threshold  Renal insuff grade 3  Hyperlipidemia  Hypomagnesemia   Recurrent VT prompted the reinitiation of amio-- NO recurrent VT.   Sleep and tremors are worse since resuming, something he had noted previously on amio.  Labs are normal   Will reach out to PCP to consider neuro referral as symptoms seem to antedate amio;  although they might be residual from his prior exposure, neuro toxicity although my brief search of the literature describes no permanent defects and comments that rapidly things improved in one case report and that all resolved by 5 months in another  Functional status is stable   Euvolemic continue current meds by report   Amio labs are normal    COVID 19 screen The patient denies symptoms of COVID 19 at this time.  The importance of social distancing was discussed today.  Follow-up:  59m Next remote: As Scheduled   Current medicines are reviewed at length with the patient today.   The patient does not have concerns regarding his medicines.  The following changes were made today:  none  Labs/ tests ordered today include: none No orders of the defined types were placed in this encounter.   Future tests ( post COVID )     Patient Risk:  after full review of this patients clinical status, I feel that they are at moderate risk at this time.  Today, I have spent  13  minutes with the patient with telehealth technology discussing the above.  Signed, Sherryl Manges, MD  09/10/2018 2:34 PM     [facility] [address] [place] [address] [phone] (office) [phone] (fax)

## 2018-09-10 NOTE — Patient Instructions (Signed)
Called pt and reviewed the following recommendations. He had no questions regarding his visit and agrees to follow up appointment date.   Medication Instructions:  Your physician recommends that you continue on your current medications as directed. Please refer to the Current Medication list given to you today.   Labwork: None ordered.   Testing/Procedures: None ordered.   Follow-Up:  You have a 3 month follow up appointment scheduled for Sept 2 @ 1:45pm  Any Other Special Instructions Will Be Listed Below (If Applicable).     If you need a refill on your cardiac medications before your next appointment, please call your pharmacy.

## 2018-09-18 ENCOUNTER — Ambulatory Visit (INDEPENDENT_AMBULATORY_CARE_PROVIDER_SITE_OTHER): Payer: Medicare Other | Admitting: *Deleted

## 2018-09-18 DIAGNOSIS — I472 Ventricular tachycardia, unspecified: Secondary | ICD-10-CM

## 2018-09-18 LAB — CUP PACEART REMOTE DEVICE CHECK
Battery Remaining Longevity: 50 mo
Battery Voltage: 2.97 V
Brady Statistic AP VP Percent: 31.78 %
Brady Statistic AP VS Percent: 0.02 %
Brady Statistic AS VP Percent: 67.58 %
Brady Statistic AS VS Percent: 0.62 %
Brady Statistic RA Percent Paced: 31.27 %
Brady Statistic RV Percent Paced: 97.21 %
Date Time Interrogation Session: 20200610052404
HighPow Impedance: 66 Ohm
Implantable Lead Implant Date: 20130513
Implantable Lead Implant Date: 20130513
Implantable Lead Implant Date: 20130513
Implantable Lead Location: 753858
Implantable Lead Location: 753859
Implantable Lead Location: 753860
Implantable Lead Model: 181
Implantable Lead Model: 4396
Implantable Lead Model: 5076
Implantable Lead Serial Number: 316784
Implantable Pulse Generator Implant Date: 20160304
Lead Channel Impedance Value: 437 Ohm
Lead Channel Impedance Value: 456 Ohm
Lead Channel Impedance Value: 456 Ohm
Lead Channel Impedance Value: 456 Ohm
Lead Channel Impedance Value: 589 Ohm
Lead Channel Impedance Value: 855 Ohm
Lead Channel Pacing Threshold Amplitude: 0.75 V
Lead Channel Pacing Threshold Amplitude: 0.875 V
Lead Channel Pacing Threshold Pulse Width: 0.4 ms
Lead Channel Pacing Threshold Pulse Width: 0.4 ms
Lead Channel Sensing Intrinsic Amplitude: 0.875 mV
Lead Channel Sensing Intrinsic Amplitude: 0.875 mV
Lead Channel Sensing Intrinsic Amplitude: 12.75 mV
Lead Channel Sensing Intrinsic Amplitude: 12.75 mV
Lead Channel Setting Pacing Amplitude: 2 V
Lead Channel Setting Pacing Amplitude: 2.5 V
Lead Channel Setting Pacing Pulse Width: 0.4 ms
Lead Channel Setting Sensing Sensitivity: 0.45 mV

## 2018-09-27 ENCOUNTER — Encounter: Payer: Self-pay | Admitting: Cardiology

## 2018-09-27 NOTE — Progress Notes (Signed)
Remote ICD transmission.   

## 2018-09-30 ENCOUNTER — Other Ambulatory Visit: Payer: Self-pay | Admitting: Cardiology

## 2018-10-04 ENCOUNTER — Telehealth: Payer: Self-pay | Admitting: Internal Medicine

## 2018-10-04 NOTE — Telephone Encounter (Signed)
smartphone/ my chart via emailed/ consent/ pre reg completed  °

## 2018-10-09 ENCOUNTER — Encounter: Payer: Self-pay | Admitting: Internal Medicine

## 2018-10-09 ENCOUNTER — Telehealth (INDEPENDENT_AMBULATORY_CARE_PROVIDER_SITE_OTHER): Payer: Medicare Other | Admitting: Internal Medicine

## 2018-10-09 ENCOUNTER — Telehealth: Payer: Self-pay | Admitting: Internal Medicine

## 2018-10-09 VITALS — BP 117/67 | HR 68 | Ht 72.0 in | Wt 204.0 lb

## 2018-10-09 DIAGNOSIS — E782 Mixed hyperlipidemia: Secondary | ICD-10-CM | POA: Diagnosis not present

## 2018-10-09 DIAGNOSIS — I255 Ischemic cardiomyopathy: Secondary | ICD-10-CM | POA: Diagnosis not present

## 2018-10-09 DIAGNOSIS — I5022 Chronic systolic (congestive) heart failure: Secondary | ICD-10-CM | POA: Diagnosis not present

## 2018-10-09 DIAGNOSIS — Z789 Other specified health status: Secondary | ICD-10-CM

## 2018-10-09 DIAGNOSIS — I25810 Atherosclerosis of coronary artery bypass graft(s) without angina pectoris: Secondary | ICD-10-CM | POA: Diagnosis not present

## 2018-10-09 MED ORDER — REPATHA SURECLICK 140 MG/ML ~~LOC~~ SOAJ: 1.0000 | SUBCUTANEOUS | 11 refills | Status: DC

## 2018-10-09 NOTE — Telephone Encounter (Signed)
Patient called to review e-visit instructions. Patient aware that the following changes have been made: start repatha 140mg /ml every 2 weeks Patient aware that they will need the following labs: fasting labs in 3-4 months to check cholesterol Patient aware that they will need the following test(s): none  Recall for 3-4 months (lipid clinic) entered.   No further assistance needed at this time.

## 2018-10-09 NOTE — Progress Notes (Signed)
noted 

## 2018-10-09 NOTE — Addendum Note (Signed)
Addended by: Fidel Levy on: 10/09/2018 11:56 AM   Modules accepted: Orders

## 2018-10-09 NOTE — Patient Instructions (Signed)
Medication Instructions:  Dr. Hilty recommends Repatha 140mg/mL - once every 14 days (PCSK9). This is an injectable cholesterol medication. This medication will need prior approval with your insurance company, which we will work on. If the medication is not approved initially, we may need to do an appeal with your insurance. We will keep you updated on this process. This medication can be provided at some local pharmacies or be shipped to you from a specialty pharmacy.   If you need a refill on your cardiac medications before your next appointment, please call your pharmacy.   Lab work: FASTING lab work in 3-4 months to check cholesterol If you have labs (blood work) drawn today and your tests are completely normal, you will receive your results only by: . MyChart Message (if you have MyChart) OR . A paper copy in the mail If you have any lab test that is abnormal or we need to change your treatment, we will call you to review the results.  Testing/Procedures: NONE  Follow-Up: Dr. Hilty recommends that you schedule a follow up visit with him the in the LIPID CLINIC in 3-4 months.  Please have fasting blood work about 1 week prior to this visit and he will review the blood work results with you at your appointment.     

## 2018-10-09 NOTE — Telephone Encounter (Signed)
PA for repatha sureclick submitted via [email or web address]  (Key: [id number])

## 2018-10-09 NOTE — Telephone Encounter (Signed)
Request Reference Number: [id number]. REPATHA SURE INJ 140MG /ML is approved through 04/11/2019

## 2018-10-09 NOTE — Progress Notes (Signed)
Virtual Visit via Video Note   This visit type was conducted due to national recommendations for restrictions regarding the COVID-19 Pandemic (e.g. social distancing) in an effort to limit this patient's exposure and mitigate transmission in our community.  Due to his co-morbid illnesses, this patient is at least at moderate risk for complications without adequate follow up.  This format is felt to be most appropriate for this patient at this time.  All issues noted in this document were discussed and addressed.  A limited physical exam was performed with this format.  Please refer to the patient's chart for his consent to telehealth for [facility].   Evaluation Performed:  Doxy.me video visit  Date:  10/09/2018   ID:  [name], [name] 06-01-1955, MRN [id number]  Patient Location:  [address] [place] [address]  Provider location:   [address] [place], [place] [address]  PCP:  Burnard Bunting, MD  Cardiologist:  No primary care provider on file. Electrophysiologist:  None   Chief Complaint:  Manage dyslipidemia  History of Present Illness:    [name] is a 63 y.o. male who presents via audio/video conferencing for a telehealth visit today.  [name] is a pleasant 63 year old male with a history of ischemic cardiomyopathy, coronary artery disease status post CABG, recurrent ventricular tachycardia, and a number of other cardiovascular conditions including dyslipidemia.  He has had poorly controlled cholesterol with statin intolerance, including intolerance to Vytorin, Crestor 5 mg 3 times weekly and 10 mg daily, and torvastatin 40 mg daily.  He has been managed on fenofibrate 160 mg daily and over-the-counter fish oil tablets.  He saw Fuller Canada, PharmD, in 2018 in the lipid clinic who mentioned several options to him including PCSK9 inhibitor therapy at the time however he was against it.  She also mentioned he may be candidate for ezetimibe.   I would agree with this as he had previously had myalgias with Vytorin, but most likely this was the simvastatin component.  He remains at high risk of cardiovascular events with a goal LDL less than 70.  I again discussed the options including PCSK9 inhibitors with him today.  He is concerned about cost issues however as I mentioned the cost is come down significantly over the past several years.  With Medicare and supplemental that he has 3 [place] healthcare, most patients pay between $40-$60 a month.  This will require prior authorization I would also recommend applying for patient assistance.  The patient does not have symptoms concerning for COVID-19 infection (fever, chills, cough, or new SHORTNESS OF BREATH).    Prior CV studies:   The following studies were reviewed today:  Chart review Lab work  PMHx:  Past Medical History:  Diagnosis Date   AICD (automatic cardioverter/defibrillator) present    Anemia    Arthritis    "mild in my joints & knuckles" (05/10/2016)   CHF (congestive heart failure) ([employer])    Coronary artery disease    Hemochromatosis    Hyperlipidemia    Ischemic cardiomyopathy    EF 36%; prior anterior MI, s/p CABG x 3; s/p cath Feb 2013 showing grafts to be patent with severe LV dysfunction   Myocardial infarction ([place]) 2006   PAF (paroxysmal atrial fibrillation) ([place])    Pneumonia 2000   RBBB (right bundle branch block)    Sleep apnea    "can't tolerate mask" (05/10/2016)   Type II diabetes mellitus (HCC)    Ventricular  tachycardia, 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    Past Surgical History:  Procedure Laterality Date   BI-VENTRICULAR IMPLANTABLE CARDIOVERTER DEFIBRILLATOR N/A 08/21/2011   Procedure: BI-VENTRICULAR IMPLANTABLE CARDIOVERTER DEFIBRILLATOR  (CRT-D);  Surgeon: Duke SalviaSteven C Klein, MD;  [facility];  Service: Cardiovascular;  Laterality: N/A;   BIV ICD GENERTAOR CHANGE OUT N/A 06/12/2014   Procedure: BIV ICD GENERTAOR CHANGE OUT;   Surgeon: Duke SalviaSteven C Klein, MD;  [facility];  Service: Cardiovascular;  Laterality: N/A;   CARDIAC CATHETERIZATION N/A 01/19/2016   Procedure: Left Heart Cath and Cors/Grafts Angiography;  Surgeon: Peter M SwazilandJordan, MD;  [facility];  Service: Cardiovascular;  Laterality: N/A;   CORONARY ARTERY BYPASS GRAFT  2006   Emergent CABG x 3 with LIMA to LAD, SVG to OM, SVG to distal RCA per Dr. Tyrone SageGerhardt   FRACTURE SURGERY     INGUINAL HERNIA REPAIR  02/09/2012   Procedure: HERNIA REPAIR INGUINAL ADULT;  Surgeon: Cherylynn RidgesJames O Wyatt, MD;  [facility];  Service: General;  Laterality: Right;   INGUINAL HERNIA REPAIR Bilateral [phone]   INSERTION OF MESH  02/09/2012   Procedure: INSERTION OF MESH;  Surgeon: Cherylynn RidgesJames O Wyatt, MD;  [facility];  Service: General;  Laterality: Right;   LEFT HEART CATHETERIZATION WITH CORONARY/GRAFT ANGIOGRAM N/A 05/25/2011   Procedure: LEFT HEART CATHETERIZATION WITH Isabel CapriceORONARY/GRAFT ANGIOGRAM;  Surgeon: Peter M SwazilandJordan, MD;  [facility];  Service: Cardiovascular;  Laterality: N/A;   LIVER BIOPSY  1990s   SHOULDER OPEN ROTATOR CUFF REPAIR Bilateral ?2000/~ 2007   left; right    V TACH ABLATION N/A 06/08/2016   Procedure: V Tach Ablation;  Surgeon: Hillis RangeJames Allred, MD;  [facility];  Service: Cardiovascular;  Laterality: N/A;   VENTRICULAR ABLATION SURGERY  06/08/2016   WRIST FRACTURE SURGERY Left 06/2005   "plates and screws and cadavaer bone"    FAMHx:  Family History  Adopted: Yes    SOCHx:   reports that he quit smoking about 13 years ago. His smoking use included cigarettes. He has a 60.00 pack-year smoking history. He has never used smokeless tobacco. He reports current alcohol use of about 3.0 standard drinks of alcohol per week. He reports that he does not use drugs.  ALLERGIES:  Allergies  Allergen Reactions   Entresto [Sacubitril-Valsartan] Other (See Comments)    Hypotension, increased heart rate,  dizziness, blurry vision   Statins Other (See Comments)    Myalgias     MEDS:  Current Meds  Medication Sig   amiodarone (PACERONE) 200 MG tablet Take 200 mg by mouth daily.   aspirin 81 MG tablet Take 81 mg by mouth daily.   carvedilol (COREG) 25 MG tablet Take 1 tablet (25 mg total) by mouth 2 (two) times daily with a meal.   fenofibrate 160 MG tablet TAKE 1 TABLET (160 MG TOTAL) BY MOUTH DAILY.   furosemide (LASIX) 20 MG tablet TAKE 1/2 TABLET BY MOUTH DAILY.   lisinopril (ZESTRIL) 5 MG tablet TAKE 1 TABLET BY MOUTH DAILY. AFTER STOPPING ENTRESTO   magnesium oxide (MAG-OX) 400 (241.3 Mg) MG tablet TAKE 1 TABLET BY MOUTH 2 TIMES DAILY.   metFORMIN (GLUCOPHAGE) 500 MG tablet Take 1,000 mg by mouth 2 (two) times daily with a meal.    NITROSTAT 0.4 MG SL tablet PLACE 1 TABLET UNDER THE TONGUE EVERY 5 MINUTES AS NEEDED FOR CHEST PAIN FOR UP TO 3 DOSES.   Omega-3 Fatty Acids (RA FISH OIL) 1400  MG CPDR Take 1,400 mg by mouth 2 (two) times daily.   ONETOUCH VERIO test strip 1 each by Other route as needed for other.    spironolactone (ALDACTONE) 25 MG tablet Take 1 tablet (25 mg total) by mouth daily.     ROS: Pertinent items noted in HPI and remainder of comprehensive ROS otherwise negative.  Labs/Other Tests and Data Reviewed:    Recent Labs: 07/30/2018: BUN 24; Creatinine, Ser 1.50; Magnesium 1.7; Potassium 4.6; Sodium 137 09/03/2018: ALT 16; TSH 2.970   Recent Lipid Panel Lab Results  Component Value Date/Time   CHOL 259 (H) 05/11/2016 02:09 AM   TRIG 185 (H) 05/11/2016 02:09 AM   HDL 39 (L) 05/11/2016 02:09 AM   CHOLHDL 6.6 05/11/2016 02:09 AM   LDLCALC 183 (H) 05/11/2016 02:09 AM   LDLDIRECT 206.0 04/14/2013 09:58 AM    Wt Readings from Last 3 Encounters:  10/09/18 204 lb (92.5 kg)  09/10/18 205 lb (93 kg)  07/31/18 204 lb 12.8 oz (92.9 kg)     Exam:    Vital Signs:  BP 117/67    Pulse 68    Ht 6' (1.829 m)    Wt 204 lb (92.5 kg)    BMI 27.67 kg/m     General appearance: alert and no distress Lungs: No visual respiratory difficulty Abdomen: Mildly overweight Extremities: extremities normal, atraumatic, no cyanosis or edema Skin: Skin color, texture, turgor normal. No rashes or lesions Neurologic: Mental status: Alert, oriented, thought content appropriate Psych: Pleasant  ASSESSMENT & PLAN:    1. Coronary artery disease status post CABG 2. Ischemic cardiomyopathy 3. Recurrent ventricular tachycardia status post CRT-D and VT ablation 4. Mixed dyslipidemia 5. Statin intolerance  [name] has multiple cardiovascular disorders including ischemic cardiomyopathy, prior anterior MI, ischemic cardiomyopathy, recurrent VT, and mixed dyslipidemia with statin intolerance.  He is well above goal LDL less than 70 and is at risk for recurrent events.  I think he is a good candidate for PCSK9 inhibitor therapy which she will tolerate well if he is willing to try the medication which he indicated he would be if the cost was effective.  We will go ahead and try a prior authorization for that as well as encouraged him to apply for patient assistance.  If he is successful in starting the medication we will plan a repeat lipid profile in 3 to 4 months and follow-up with me at that time.  Ultimately if it is cost prohibitive, we may consider enrollment in the Orion-4 study of inclisiran, which would provide either free drug or placebo.  Also, oral options including Nexletol and ezetimibe could be an option however long-term outcome data is not available at this point.  Thanks again for the kind referral.  COVID-19 Education: The signs and symptoms of COVID-19 were discussed with the patient and how to seek care for testing (follow up with PCP or arrange E-visit).  The importance of social distancing was discussed today.  Patient Risk:   After full review of this patients clinical status, I feel that they are at least moderate risk at this time.  Time:    Today, I have spent 25 minutes with the patient with telehealth technology discussing dyslipidemia, coronary artery history, statin intolerance, PCSK9 inhibitors.     Medication Adjustments/Labs and Tests Ordered: Current medicines are reviewed at length with the patient today.  Concerns regarding medicines are outlined above.   Tests Ordered: No orders of the defined types were placed in this  encounter.   Medication Changes: No orders of the defined types were placed in this encounter.   Disposition:  in 3 month(s)  Chrystie NoseKenneth C. Marvon Shillingburg, MD, [facility] Professional GroupFACC, FACP  [place]   [facility]  Medical Director of the Advanced Lipid Disorders &  Cardiovascular Risk Reduction Clinic Diplomate of the American Board of Clinical Lipidology Attending Cardiologist  Direct Dial: [phone]   Fax: [phone]  Website:  [email or web address]  Chrystie NoseKenneth C Janicia Monterrosa, MD  10/09/2018 11:48 AM

## 2018-10-10 NOTE — Telephone Encounter (Signed)
Patient aware med has been approved. He will contact [facility] to determine cost of medication and will reach out if not affordable.

## 2018-11-29 ENCOUNTER — Other Ambulatory Visit: Payer: Self-pay | Admitting: Internal Medicine

## 2018-11-29 NOTE — Telephone Encounter (Signed)
Pt calling wanting to know if Dr. Caryl Comes still wants him to take Amiodarone 200 mg tablet, if so pt states that he needs a refill sent to his pharmacy, because pt stated that Dr. Caryl Comes did not give him refills on this medication the last time because he wanted to see if pt could tolerate it. Pt would like a call back concerning this matter. Please address

## 2018-11-29 NOTE — Telephone Encounter (Signed)
Pt coming in for app to discuss amio side effects on 8/24.

## 2018-11-30 IMAGING — CR DG CHEST 1V PORT
1 series · 1 of 1 positions shown · non-contrast
Comparison: Chest radiograph performed 01/19/2016

CLINICAL DATA: Ventricular tachycardia, acute onset. Defibrillator
fired. Gurgling and vomiting. Assess for aspiration. Initial
encounter.

EXAM:
PORTABLE CHEST 1 VIEW

[portable]
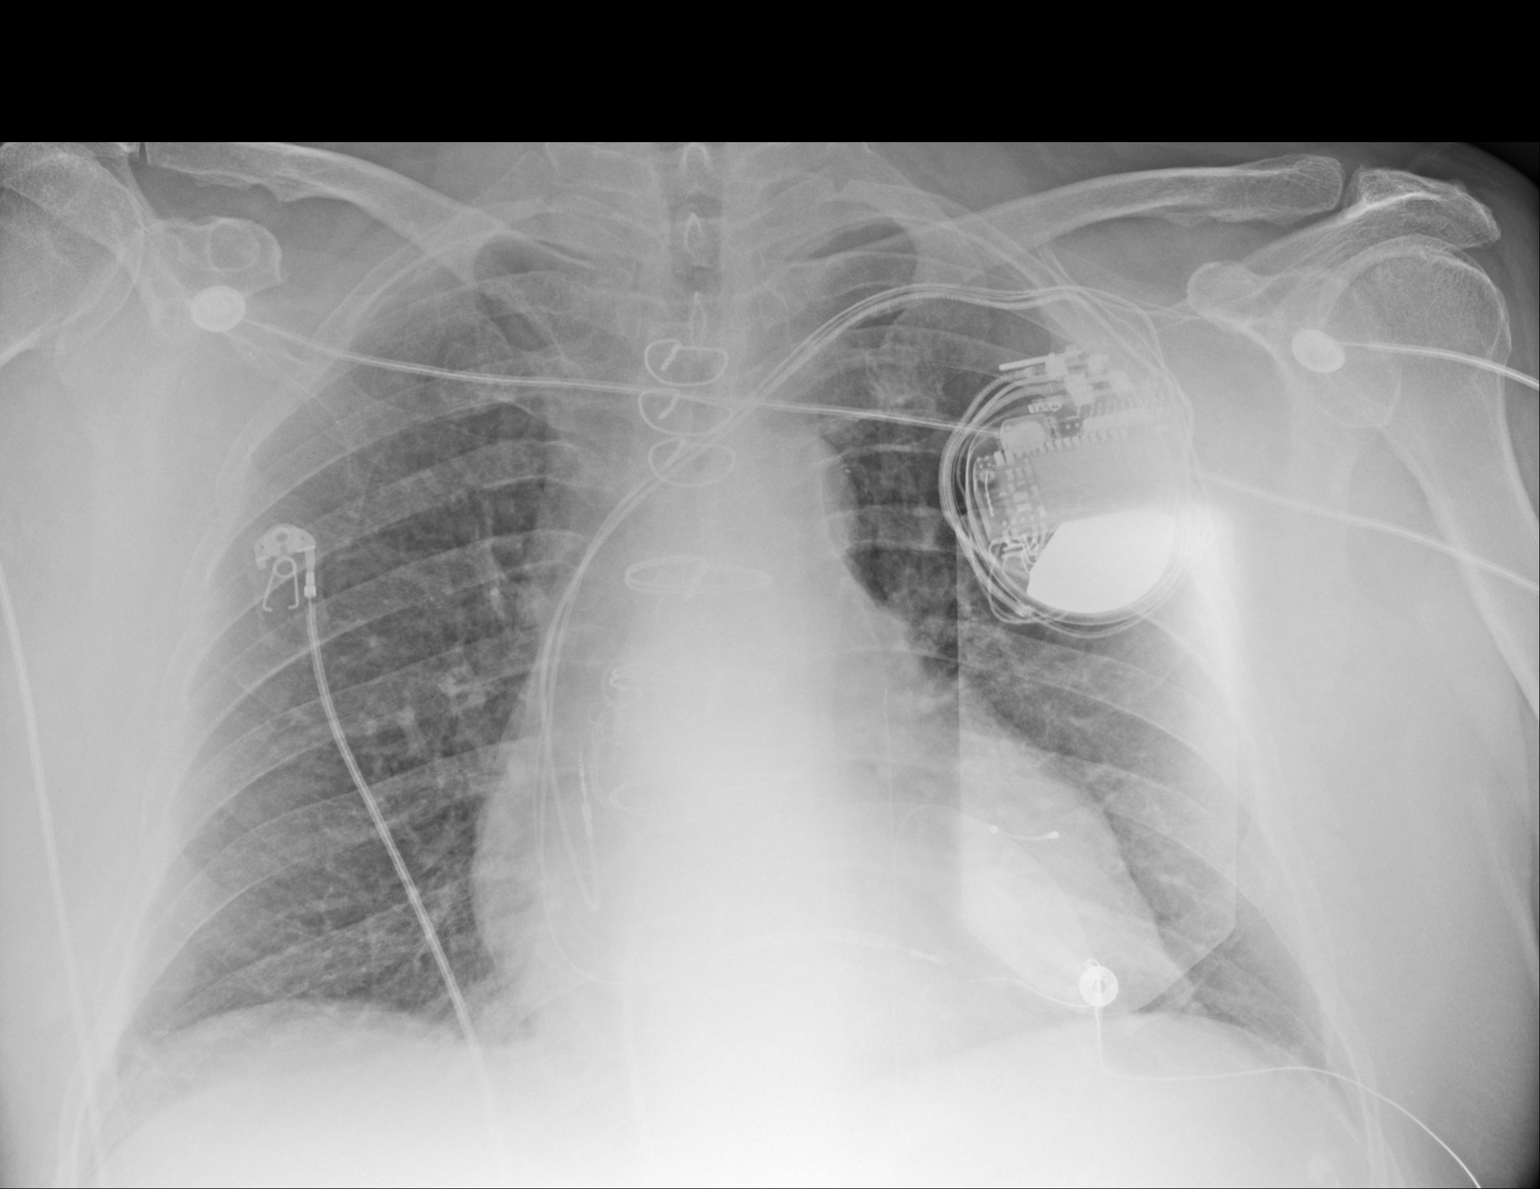

[1 of 1 positions shown; findings below may reference images not displayed]

FINDINGS: The lungs are well-aerated. Vascular congestion is noted. Increased
interstitial markings may reflect mild transient interstitial edema.
There is no evidence of pleural effusion or pneumothorax.

The cardiomediastinal silhouette is borderline enlarged. The patient
is status post median sternotomy. A pacemaker is noted overlying the
left chest wall, with leads ending overlying the right atrium, right
ventricle and coronary sinus. No acute osseous abnormalities are
seen. An external pacing pad is noted.
IMPRESSION: Vascular congestion and borderline cardiomegaly. Increased
interstitial markings may reflect mild transient interstitial edema.
No radiographic evidence for aspiration at this time.

## 2018-12-02 ENCOUNTER — Encounter: Payer: Self-pay | Admitting: Internal Medicine

## 2018-12-02 ENCOUNTER — Ambulatory Visit (INDEPENDENT_AMBULATORY_CARE_PROVIDER_SITE_OTHER): Payer: Medicare Other | Admitting: Internal Medicine

## 2018-12-02 ENCOUNTER — Other Ambulatory Visit: Payer: Self-pay

## 2018-12-02 VITALS — BP 120/68 | HR 65 | Ht 72.0 in | Wt 200.6 lb

## 2018-12-02 DIAGNOSIS — Z95810 Presence of automatic (implantable) cardiac defibrillator: Secondary | ICD-10-CM | POA: Diagnosis not present

## 2018-12-02 DIAGNOSIS — I4510 Unspecified right bundle-branch block: Secondary | ICD-10-CM | POA: Diagnosis not present

## 2018-12-02 DIAGNOSIS — I5022 Chronic systolic (congestive) heart failure: Secondary | ICD-10-CM

## 2018-12-02 DIAGNOSIS — I255 Ischemic cardiomyopathy: Secondary | ICD-10-CM | POA: Diagnosis not present

## 2018-12-02 NOTE — Progress Notes (Signed)
    Patient Care Team: Aronson, Richard, MD as PCP - General (Internal Medicine)   HPI  [name] is a 63 y.o. male Seen in followup for an CRT-D implanted for primary prevention April 2013 for ischemic cardiomyopathy and prior myocardial infarction and right bundle branch block. He underwent device generator reimplantation 2/16  He was admitted 10/17 w VT storm-- noted to be mildly hypokalemic; Aldactone and Mag started. Nitrates added for possible ischemic trigger  Sotalol added      DATE TEST EF   3/13 echo  40-45%   3/16 echo  30-35%   10/17 cath  35-40% LIMA/SVG OM2-PDA -- patent , severe native disease  1/18 Echo  40-45%     Date Cr Mg K LDL LFTs TSH  10.13   1.9         11/17  1.49   4.8     3/18 1.67 1.4 4.3 183    12/18 1.48  5.0     4/20 1.5 1.7 4.6  15 2.97   He had recurrent ventricular tachycardia despite the sotalol. He underwent catheter ablation by Dr. JA 3/18.  Sotalol now discontinued.with recurrent VT started back on amio 4/20 and he stopped following taper-- had some tremors and diarrhea which have persisted despite having stopped taking the amiodarone 4 weeks ago    He has met with PhD Supple re PCSK 9.   declined   Past Medical History:  Diagnosis Date  . AICD (automatic cardioverter/defibrillator) present   . Anemia   . Arthritis    "mild in my joints & knuckles" (05/10/2016)  . CHF (congestive heart failure) (HCC)   . Coronary artery disease   . Hemochromatosis   . Hyperlipidemia   . Ischemic cardiomyopathy    EF 36%; prior anterior MI, s/p CABG x 3; s/p cath Feb 2013 showing grafts to be patent with severe LV dysfunction  . Myocardial infarction (HCC) 2006  . PAF (paroxysmal atrial fibrillation) (HCC)   . Pneumonia 2000  . RBBB (right bundle branch block)   . Sleep apnea    "can't tolerate mask" (05/10/2016)  . Type II diabetes mellitus (HCC)   . Ventricular tachycardia, 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    Past  Surgical History:  Procedure Laterality Date  . BI-VENTRICULAR IMPLANTABLE CARDIOVERTER DEFIBRILLATOR N/A 08/21/2011   Procedure: BI-VENTRICULAR IMPLANTABLE CARDIOVERTER DEFIBRILLATOR  (CRT-D);  Surgeon: Steven C Klein, MD;  [facility] CATH LAB;  Service: Cardiovascular;  Laterality: N/A;  . BIV ICD GENERTAOR CHANGE OUT N/A 06/12/2014   Procedure: BIV ICD GENERTAOR CHANGE OUT;  Surgeon: Steven C Klein, MD;  [facility]: [facility] CATH LAB;  Service: Cardiovascular;  Laterality: N/A;  . CARDIAC CATHETERIZATION N/A 01/19/2016   Procedure: Left Heart Cath and Cors/Grafts Angiography;  Surgeon: Peter M Jordan, MD;  [facility]: [facility] INVASIVE CV LAB;  Service: Cardiovascular;  Laterality: N/A;  . CORONARY ARTERY BYPASS GRAFT  2006   Emergent CABG x 3 with LIMA to LAD, SVG to OM, SVG to distal RCA per Dr. Gerhardt  . FRACTURE SURGERY    . INGUINAL HERNIA REPAIR  02/09/2012   Procedure: HERNIA REPAIR INGUINAL ADULT;  Surgeon: James O Wyatt, MD;  Location: [facility] OR;  Service: General;  Laterality: Right;  . INGUINAL HERNIA REPAIR Bilateral 1990s-2013  . INSERTION OF MESH  02/09/2012   Procedure: INSERTION OF MESH;  Surgeon: James O Wyatt, MD;  Location: [facility] OR;  Service: General;  Laterality: Right;  . LEFT HEART CATHETERIZATION WITH CORONARY/GRAFT   ANGIOGRAM N/A 05/25/2011   Procedure: LEFT HEART CATHETERIZATION WITH Beatrix Fetters;  Surgeon: Peter M Martinique, MD;  [facility];  Service: Cardiovascular;  Laterality: N/A;  . LIVER BIOPSY  1990s  . SHOULDER OPEN ROTATOR CUFF REPAIR Bilateral ?2000/~ 2007   left; right   . V TACH ABLATION N/A 06/08/2016   Procedure: V Tach Ablation;  Surgeon: Thompson Grayer, MD;  Location: [facility] CV LAB;  Service: Cardiovascular;  Laterality: N/A;  . VENTRICULAR ABLATION SURGERY  06/08/2016  . WRIST FRACTURE SURGERY Left 06/2005   "plates and screws and cadavaer bone"    Current Outpatient Medications  Medication Sig Dispense Refill  . aspirin 81 MG tablet Take 81 mg  by mouth daily.    . carvedilol (COREG) 25 MG tablet Take 1 tablet (25 mg total) by mouth 2 (two) times daily with a meal. 180 tablet 3  . fenofibrate 160 MG tablet TAKE 1 TABLET (160 MG TOTAL) BY MOUTH DAILY. 90 tablet 3  . furosemide (LASIX) 20 MG tablet TAKE 1/2 TABLET BY MOUTH DAILY. 30 tablet 3  . lisinopril (ZESTRIL) 5 MG tablet Take 5 mg by mouth daily.    . magnesium oxide (MAG-OX) 400 (241.3 Mg) MG tablet TAKE 1 TABLET BY MOUTH 2 TIMES DAILY. 180 tablet 0  . metFORMIN (GLUCOPHAGE) 500 MG tablet Take 1,000 mg by mouth 2 (two) times daily with a meal.     . NITROSTAT 0.4 MG SL tablet PLACE 1 TABLET UNDER THE TONGUE EVERY 5 MINUTES AS NEEDED FOR CHEST PAIN FOR UP TO 3 DOSES. 25 tablet 3  . Omega-3 Fatty Acids (RA FISH OIL) 1400 MG CPDR Take 1,400 mg by mouth 2 (two) times daily.    Glory Rosebush VERIO test strip 1 each by Other route as needed for other.   6  . spironolactone (ALDACTONE) 25 MG tablet Take 1 tablet (25 mg total) by mouth daily. 90 tablet 3   No current facility-administered medications for this visit.     Allergies  Allergen Reactions  . Entresto [Sacubitril-Valsartan] Other (See Comments)    Hypotension, increased heart rate, dizziness, blurry vision  . Statins Other (See Comments)    Myalgias       Review of Systems negative except from HPI and PMH  Physical Exam BP 120/68   Pulse 65   Ht 6' (1.829 m)   Wt 200 lb 9.6 oz (91 kg)   SpO2 95%   BMI 27.21 kg/m  Well developed and well nourished in no acute distress HENT normal Neck supple with JVP-flat Clear Device pocket well healed; without hematoma or erythema.  There is no tethering  Regular rate and rhythm, no  gallop No murmur Abd-soft with active BS No Clubbing cyanosis   edema Skin-warm and dry A & Oriented  Grossly normal sensory and motor function  ECG AV pacing  Assessment and  Plan  Ischemic cardiomyopathy  VT storm s/p ablation   CRT-Medtronic The patient's device was interrogated  and the information was fully reviewed. The device was reprogrammed to Maximize longevity  RBBB  Sinus node dysfunction  Congestive heart failure-chronic systolic  High output LV capture threshold  Renal insuff grade 3  Hyperlipidemia  Hypomagnesemia  Diarrhea  Tremor    In pts mind both diarrhea nad termor worsened with the initiation of amiodarone and has not improved with its discontinuation  Micromedex does NOT describe diarrhea; typically neurotoxicity abates with drug discontinuation-- he had an antecedent tremor   Will  reach out to his PCP for help with evaluating his diarrhea  Will review his VT burden in about 4 weeks and reconsider amiodaorne/alternative drugs  Current medicines are reviewed at length with the patient today .  The patient does not have concerns regarding medicines.  

## 2018-12-02 NOTE — Patient Instructions (Signed)
Medication Instructions:  Your physician recommends that you continue on your current medications as directed. Please refer to the Current Medication list given to you today.  Labwork: None ordered.  Testing/Procedures: None ordered.  Follow-Up:  You have a telehealth visit with Dr. Caryl Comes scheduled for October 8 @ 2:30pm. We will contact you a few minutes prior to your appointment. He will text you a link inviting you to a virtual meeting. Click on the link and allow the link permission to use your camera and microphone. Dr. Caryl Comes will camera in at that time.   Any Other Special Instructions Will Be Listed Below (If Applicable).     If you need a refill on your cardiac medications before your next appointment, please call your pharmacy.

## 2018-12-03 LAB — CUP PACEART INCLINIC DEVICE CHECK
Battery Remaining Longevity: 45 mo
Battery Voltage: 2.97 V
Brady Statistic AP VP Percent: 29.26 %
Brady Statistic AP VS Percent: 0.02 %
Brady Statistic AS VP Percent: 70.15 %
Brady Statistic AS VS Percent: 0.56 %
Brady Statistic RA Percent Paced: 28.88 %
Brady Statistic RV Percent Paced: 97.43 %
Date Time Interrogation Session: 20200824191706
HighPow Impedance: 71 Ohm
Implantable Lead Implant Date: 20130513
Implantable Lead Implant Date: 20130513
Implantable Lead Implant Date: 20130513
Implantable Lead Location: 753858
Implantable Lead Location: 753859
Implantable Lead Location: 753860
Implantable Lead Model: 181
Implantable Lead Model: 4396
Implantable Lead Model: 5076
Implantable Lead Serial Number: 316784
Implantable Pulse Generator Implant Date: 20160304
Lead Channel Impedance Value: 437 Ohm
Lead Channel Impedance Value: 456 Ohm
Lead Channel Impedance Value: 494 Ohm
Lead Channel Impedance Value: 513 Ohm
Lead Channel Impedance Value: 589 Ohm
Lead Channel Impedance Value: 893 Ohm
Lead Channel Pacing Threshold Amplitude: 0.875 V
Lead Channel Pacing Threshold Amplitude: 1 V
Lead Channel Pacing Threshold Pulse Width: 0.4 ms
Lead Channel Pacing Threshold Pulse Width: 0.4 ms
Lead Channel Sensing Intrinsic Amplitude: 1.125 mV
Lead Channel Sensing Intrinsic Amplitude: 1.125 mV
Lead Channel Sensing Intrinsic Amplitude: 12.75 mV
Lead Channel Sensing Intrinsic Amplitude: 12.875 mV
Lead Channel Setting Pacing Amplitude: 2 V
Lead Channel Setting Pacing Amplitude: 2.5 V
Lead Channel Setting Pacing Pulse Width: 0.4 ms
Lead Channel Setting Sensing Sensitivity: 0.45 mV

## 2018-12-12 ENCOUNTER — Encounter: Payer: Medicare Other | Admitting: Internal Medicine

## 2018-12-18 ENCOUNTER — Ambulatory Visit (INDEPENDENT_AMBULATORY_CARE_PROVIDER_SITE_OTHER): Payer: Medicare Other | Admitting: *Deleted

## 2018-12-18 DIAGNOSIS — I5022 Chronic systolic (congestive) heart failure: Secondary | ICD-10-CM

## 2018-12-18 DIAGNOSIS — I472 Ventricular tachycardia, unspecified: Secondary | ICD-10-CM

## 2018-12-18 LAB — CUP PACEART REMOTE DEVICE CHECK
Battery Remaining Longevity: 44 mo
Battery Voltage: 2.97 V
Brady Statistic AP VP Percent: 35.04 %
Brady Statistic AP VS Percent: 0.01 %
Brady Statistic AS VP Percent: 64.89 %
Brady Statistic AS VS Percent: 0.06 %
Brady Statistic RA Percent Paced: 34.93 %
Brady Statistic RV Percent Paced: 99.53 %
Date Time Interrogation Session: 20200909083723
HighPow Impedance: 57 Ohm
Implantable Lead Implant Date: 20130513
Implantable Lead Implant Date: 20130513
Implantable Lead Implant Date: 20130513
Implantable Lead Location: 753858
Implantable Lead Location: 753859
Implantable Lead Location: 753860
Implantable Lead Model: 181
Implantable Lead Model: 4396
Implantable Lead Model: 5076
Implantable Lead Serial Number: 316784
Implantable Pulse Generator Implant Date: 20160304
Lead Channel Impedance Value: 380 Ohm
Lead Channel Impedance Value: 437 Ohm
Lead Channel Impedance Value: 456 Ohm
Lead Channel Impedance Value: 456 Ohm
Lead Channel Impedance Value: 513 Ohm
Lead Channel Impedance Value: 779 Ohm
Lead Channel Pacing Threshold Amplitude: 0.75 V
Lead Channel Pacing Threshold Amplitude: 1 V
Lead Channel Pacing Threshold Pulse Width: 0.4 ms
Lead Channel Pacing Threshold Pulse Width: 0.4 ms
Lead Channel Sensing Intrinsic Amplitude: 1.25 mV
Lead Channel Sensing Intrinsic Amplitude: 1.25 mV
Lead Channel Sensing Intrinsic Amplitude: 12.75 mV
Lead Channel Sensing Intrinsic Amplitude: 12.875 mV
Lead Channel Setting Pacing Amplitude: 2 V
Lead Channel Setting Pacing Amplitude: 2.5 V
Lead Channel Setting Pacing Pulse Width: 0.4 ms
Lead Channel Setting Sensing Sensitivity: 0.45 mV

## 2018-12-23 NOTE — Progress Notes (Signed)
Virtual Visit via Telephone Note   This visit type was conducted due to national recommendations for restrictions regarding the COVID-19 Pandemic (e.g. social distancing) in an effort to limit this patient's exposure and mitigate transmission in our community.  Due to his co-morbid illnesses, this patient is at least at moderate risk for complications without adequate follow up.  This format is felt to be most appropriate for this patient at this time.  The patient did not have access to video technology/had technical difficulties with video requiring transitioning to audio format only (telephone).  All issues noted in this document were discussed and addressed.  No physical exam could be performed with this format.  Please refer to the patient's chart for his  consent to telehealth for [facility].   Date:  12/23/2018   ID:  [name], DOB 22-Nov-1955, MRN [id number]  Patient Location: Home Provider Location: Home  PCP:  Burnard Bunting, MD  Cardiologist:  Jarica Plass Martin[place] MD Electrophysiologist:  None   Evaluation Performed:  Follow-Up Visit  Chief Complaint:  Follow up CAD  History of Present Illness:    [name] is a 63 y.o. male with history of an ischemic CM and has CRT-D in place for primary prevention since April of 2013. EF by Echo was 45-50% at that time. Cardiac MRI showed an EF of 31%. Other issues include HLD (statin intolerant and intolerant to zetia), hemochromatosis, RBBB, PAF, type 2 DM and OSA. He is s/p CABG in 2006 following an MI. Cardiac cath in February 2013 showed patrent grafts. After CRT therapy he did have a significant improvement in dyspnea.   He was admitted in October 2017 with VT and received shocks. Cardiac cath demonstrated patent grafts. He was started on sotalol. In January 2018 he presented with syncope and recurrent VT. Switched to amiodarone. Was concerned about taking amiodarone long term. Underwent VT ablation by Dr. Rayann Heman on June 08, 2016. Amiodarone later discontinued.  He notes some mild NSVT episodes since then and was put back on amiodarone for a couple of months but then discontinued again. He has been followed by Dr Caryl Comes. Last ICD check on 12/18/18 showed no significant VT. Normal device function  Last Echo in January 2018 showed EF 40-45%.  He is intolerant of niacin, statins (Simcor and Vytorin) and Zetia. He has been seen by Dr Debara Pickett in the lipid clinic and recommended he start on Repatha.   He was unable to tolerate Entresto in the past due to hypotension.   He is very active doing yard work and splitting wood. He denies any chest pain, SOB, palpitations, or edema. Weight is stable.   The patient does not have symptoms concerning for COVID-19 infection (fever, chills, cough, or new shortness of breath).    Past Medical History:  Diagnosis Date  . AICD (automatic cardioverter/defibrillator) present   . Anemia   . Arthritis    "mild in my joints & knuckles" (05/10/2016)  . CHF (congestive heart failure) ([place])   . Coronary artery disease   . Hemochromatosis   . Hyperlipidemia   . Ischemic cardiomyopathy    EF 36%; prior anterior MI, s/p CABG x 3; s/p cath Feb 2013 showing grafts to be patent with severe LV dysfunction  . Myocardial infarction ([place]) 2006  . PAF (paroxysmal atrial fibrillation) ([place])   . Pneumonia 2000  . RBBB (right bundle branch block)   . Sleep apnea    "can't tolerate mask" (05/10/2016)  . Type  II diabetes mellitus (HCC)   . Ventricular tachycardia, 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   Past Surgical History:  Procedure Laterality Date  . BI-VENTRICULAR IMPLANTABLE CARDIOVERTER DEFIBRILLATOR N/A 08/21/2011   Procedure: BI-VENTRICULAR IMPLANTABLE CARDIOVERTER DEFIBRILLATOR  (CRT-D);  Surgeon: Duke Salvia, MD;  Location: [facility] CATH LAB;  Service: Cardiovascular;  Laterality: N/A;  . BIV ICD GENERTAOR CHANGE OUT N/A 06/12/2014   Procedure: BIV ICD GENERTAOR CHANGE OUT;  Surgeon: Duke Salvia, MD;   Location: [facility] CATH LAB;  Service: Cardiovascular;  Laterality: N/A;  . CARDIAC CATHETERIZATION N/A 01/19/2016   Procedure: Left Heart Cath and Cors/Grafts Angiography;  Surgeon: Dynasty Holquin M Sw[place], MD;  Location: [facility] INVASIVE CV LAB;  Service: Cardiovascular;  Laterality: N/A;  . CORONARY ARTERY BYPASS GRAFT  2006   Emergent CABG x 3 with LIMA to LAD, SVG to OM, SVG to distal RCA per Dr. Tyrone Sage  . FRACTURE SURGERY    . INGUINAL HERNIA REPAIR  02/09/2012   Procedure: HERNIA REPAIR INGUINAL ADULT;  Surgeon: Cherylynn Ridges, MD;  Location: [facility] OR;  Service: General;  Laterality: Right;  . INGUINAL HERNIA REPAIR Bilateral [id number]  . INSERTION OF MESH  02/09/2012   Procedure: INSERTION OF MESH;  Surgeon: Cherylynn Ridges, MD;  Location: [facility] OR;  Service: General;  Laterality: Right;  . LEFT HEART CATHETERIZATION WITH CORONARY/GRAFT ANGIOGRAM N/A 05/25/2011   Procedure: LEFT HEART CATHETERIZATION WITH Isabel Caprice;  Surgeon: Jasean Ambrosia M [place], MD;  Location: [facility] CATH LAB;  Service: Cardiovascular;  Laterality: N/A;  . LIVER BIOPSY  1990s  . SHOULDER OPEN ROTATOR CUFF REPAIR Bilateral ?2000/~ 2007   left; right   . V TACH ABLATION N/A 06/08/2016   Procedure: V Tach Ablation;  Surgeon: Hillis Range, MD;  Location: [facility] INVASIVE CV LAB;  Service: Cardiovascular;  Laterality: N/A;  . VENTRICULAR ABLATION SURGERY  06/08/2016  . WRIST FRACTURE SURGERY Left 06/2005   "plates and screws and cadavaer bone"     No outpatient medications have been marked as taking for the 12/26/18 encounter (Appointment) with [place], Crosby Bevan M, MD.     Allergies:   Sherryll Burger [sacubitril-valsartan] and Statins   Social History   Tobacco Use  . Smoking status: Former Smoker    Packs/day: 2.00    Years: 30.00    Pack years: 60.00    Types: Cigarettes    Quit date: 03/11/2005    Years since quitting: 13.7  . Smokeless tobacco: Never Used  Substance Use Topics  . Alcohol use: Yes    Alcohol/week: 3.0 standard drinks     Types: 3 Cans of beer per week  . Drug use: No     Family Hx: The patient's family history is not on file. He was adopted.  ROS:   Please see the history of present illness.    All other systems reviewed and are negative.   Prior CV studies:   The following studies were reviewed today:  Echo 04/13/16:Study Conclusions  - Left ventricle: The cavity size was mildly dilated. Systolic   function was mildly to moderately reduced. The estimated ejection   fraction was in the range of 40% to 45%. Diffuse hypokinesis.   Akinesis of the apical myocardium. Doppler parameters are   consistent with abnormal left ventricular relaxation (grade 1   diastolic dysfunction). Doppler parameters are consistent with   indeterminate ventricular filling pressure. - Aortic valve: Transvalvular velocity was within the normal range.   There was no stenosis. There was no regurgitation. - Mitral valve: Transvalvular  velocity was within the normal range.   There was no evidence for stenosis. There was trivial   regurgitation. - Right ventricle: The cavity size was normal. Wall thickness was   normal. - Tricuspid valve: There was trivial regurgitation. - Pulmonary arteries: Systolic pressure was within the normal   range. PA peak pressure: 25 mm Hg (S).   Cardiac cath 01/19/16: Procedures   Left Heart Cath and Cors/Grafts Angiography  Conclusion     Prox LAD to Mid LAD lesion, 85 %stenosed.  Ost Ramus to Ramus lesion, 55 %stenosed.  Prox Cx to Mid Cx lesion, 100 %stenosed.  Ost RCA to Dist RCA lesion, 100 %stenosed.  SVG graft was visualized by angiography and is small.  SVG graft was visualized by angiography and is small.  LIMA graft was visualized by angiography and is large and anatomically normal.  There is moderate left ventricular systolic dysfunction.  LV end diastolic pressure is moderately elevated.  The left ventricular ejection fraction is 35-45% by visual estimate.  1.  Severe 3 vessel obstructive CAD 2. Large patent LIMA to the LAD 3. Patent SVG to OM2. This is a tiny graft supplying a very small OM  4. Patent SVG to PDA. This is also a tiny graft supplying a very tiny PDA 5. Moderate LV dysfunction. 6. Elevated LV EDP  Plan: medical therapy. Antiarrhythmic drug therapy per EP team.      Labs/Other Tests and Data Reviewed:    EKG:  No ECG reviewed.  Recent Labs: 07/30/2018: BUN 24; Creatinine, Ser 1.50; Magnesium 1.7; Potassium 4.6; Sodium 137 09/03/2018: ALT 16; TSH 2.970   Recent Lipid Panel Lab Results  Component Value Date/Time   CHOL 259 (H) 05/11/2016 02:09 AM   TRIG 185 (H) 05/11/2016 02:09 AM   HDL 39 (L) 05/11/2016 02:09 AM   CHOLHDL 6.6 05/11/2016 02:09 AM   LDLCALC 183 (H) 05/11/2016 02:09 AM   LDLDIRECT 206.0 04/14/2013 09:58 AM   Dated 09/18/18: cholesterol 229, triglycerides 200, HDL 35, LDL 154. A1c 5.9%. creatinine 1.9. otherwise CMET, CBC and TSH normal.  Wt Readings from Last 3 Encounters:  12/02/18 200 lb 9.6 oz (91 kg)  10/09/18 204 lb (92.5 kg)  09/10/18 205 lb (93 kg)     Objective:    Vital Signs:  There were no vitals taken for this visit.   VITAL SIGNS:  reviewed  ASSESSMENT & PLAN:    1. Chronic systolic CHF status post CRT. EF 40-45% but I think this is a fairly generous interpretation. Clinically  class  II. He was unable to tolerate Entresto. Thoracic impedence in September was normal. Continue current therapy.  2. Ischemic cardiomyopathy.   3. Coronary disease status post CABG in 2006. Cardiac catheterization in October 2017 showed all grafts were patent. Clinically without chest pain.  4. Right bundle branch block.   5. Hyperlipidemia. Patient is intolerant to statins. He is on fenofibrate and fish oil only. He states his wife is opposed to him taking Repatha. Wants to try some herbal/oil therapy first. He is going to have labs repeated next month. If no better he is willing to try Repatha.     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  Off AAD therapy.  Has ICD in place.  COVID-19 Education: The signs and symptoms of COVID-19 were discussed with the patient and how to seek care for testing (follow up with PCP or arrange E-visit).  The importance of social distancing was discussed today.  Time:  Today, I have spent 15 minutes with the patient with telehealth technology discussing the above problems.     Medication Adjustments/Labs and Tests Ordered: Current medicines are reviewed at length with the patient today.  Concerns regarding medicines are outlined above.   Tests Ordered: No orders of the defined types were placed in this encounter.   Medication Changes: No orders of the defined types were placed in this encounter.   Follow Up:  In Person in 1 year(s)  Signed, Tersea Aulds [place]azil[place]Jordan, MD  12/23/2018 10:12 AM    [facility]

## 2018-12-26 ENCOUNTER — Telehealth (INDEPENDENT_AMBULATORY_CARE_PROVIDER_SITE_OTHER): Payer: Medicare Other | Admitting: Cardiology

## 2018-12-26 ENCOUNTER — Encounter: Payer: Self-pay | Admitting: Cardiology

## 2018-12-26 VITALS — BP 113/68 | HR 67 | Ht 72.0 in | Wt 206.0 lb

## 2018-12-26 DIAGNOSIS — I472 Ventricular tachycardia, unspecified: Secondary | ICD-10-CM

## 2018-12-26 DIAGNOSIS — I5022 Chronic systolic (congestive) heart failure: Secondary | ICD-10-CM

## 2018-12-26 DIAGNOSIS — E782 Mixed hyperlipidemia: Secondary | ICD-10-CM

## 2018-12-26 DIAGNOSIS — I255 Ischemic cardiomyopathy: Secondary | ICD-10-CM

## 2018-12-26 DIAGNOSIS — I4510 Unspecified right bundle-branch block: Secondary | ICD-10-CM

## 2018-12-26 DIAGNOSIS — I25810 Atherosclerosis of coronary artery bypass graft(s) without angina pectoris: Secondary | ICD-10-CM

## 2018-12-26 NOTE — Patient Instructions (Signed)
Medication Instructions:  Continue same medications If you need a refill on your cardiac medications before your next appointment, please call your pharmacy.   Lab work: None ordered   Testing/Procedures: None ordered  Follow-Up: At [facility], you and your health needs are our priority.  As part of our continuing mission to provide you with exceptional heart care, we have created designated Provider Care Teams.  These Care Teams include your primary Cardiologist (physician) and Advanced Practice Providers (APPs -  Physician Assistants and Nurse Practitioners) who all work together to provide you with the care you need, when you need it. . Schedule follow up appointment in 1 year    Call in July to schedule Sept appointment

## 2019-01-02 ENCOUNTER — Ambulatory Visit (INDEPENDENT_AMBULATORY_CARE_PROVIDER_SITE_OTHER): Payer: Medicare Other | Admitting: *Deleted

## 2019-01-02 DIAGNOSIS — I255 Ischemic cardiomyopathy: Secondary | ICD-10-CM

## 2019-01-02 NOTE — Progress Notes (Signed)
Remote ICD transmission.   

## 2019-01-03 LAB — CUP PACEART REMOTE DEVICE CHECK
Battery Remaining Longevity: 44 mo
Battery Voltage: 2.97 V
Brady Statistic AP VP Percent: 33.72 %
Brady Statistic AP VS Percent: 0.01 %
Brady Statistic AS VP Percent: 66.2 %
Brady Statistic AS VS Percent: 0.07 %
Brady Statistic RA Percent Paced: 33.56 %
Brady Statistic RV Percent Paced: 99.09 %
Date Time Interrogation Session: 20200924052203
HighPow Impedance: 70 Ohm
Implantable Lead Implant Date: 20130513
Implantable Lead Implant Date: 20130513
Implantable Lead Implant Date: 20130513
Implantable Lead Location: 753858
Implantable Lead Location: 753859
Implantable Lead Location: 753860
Implantable Lead Model: 181
Implantable Lead Model: 4396
Implantable Lead Model: 5076
Implantable Lead Serial Number: 316784
Implantable Pulse Generator Implant Date: 20160304
Lead Channel Impedance Value: 437 Ohm
Lead Channel Impedance Value: 456 Ohm
Lead Channel Impedance Value: 456 Ohm
Lead Channel Impedance Value: 494 Ohm
Lead Channel Impedance Value: 627 Ohm
Lead Channel Impedance Value: 855 Ohm
Lead Channel Pacing Threshold Amplitude: 0.75 V
Lead Channel Pacing Threshold Amplitude: 1 V
Lead Channel Pacing Threshold Pulse Width: 0.4 ms
Lead Channel Pacing Threshold Pulse Width: 0.4 ms
Lead Channel Sensing Intrinsic Amplitude: 1 mV
Lead Channel Sensing Intrinsic Amplitude: 1 mV
Lead Channel Sensing Intrinsic Amplitude: 12.75 mV
Lead Channel Sensing Intrinsic Amplitude: 12.875 mV
Lead Channel Setting Pacing Amplitude: 2 V
Lead Channel Setting Pacing Amplitude: 2.5 V
Lead Channel Setting Pacing Pulse Width: 0.4 ms
Lead Channel Setting Sensing Sensitivity: 0.45 mV

## 2019-01-10 ENCOUNTER — Encounter: Payer: Self-pay | Admitting: Cardiology

## 2019-01-10 NOTE — Progress Notes (Signed)
Remote ICD transmission.   

## 2019-01-16 ENCOUNTER — Telehealth (INDEPENDENT_AMBULATORY_CARE_PROVIDER_SITE_OTHER): Payer: Medicare Other | Admitting: Internal Medicine

## 2019-01-16 ENCOUNTER — Other Ambulatory Visit: Payer: Self-pay

## 2019-01-16 ENCOUNTER — Encounter: Payer: Self-pay | Admitting: Internal Medicine

## 2019-01-16 VITALS — BP 107/60 | HR 67 | Ht 72.0 in | Wt 206.4 lb

## 2019-01-16 DIAGNOSIS — I472 Ventricular tachycardia, unspecified: Secondary | ICD-10-CM

## 2019-01-16 DIAGNOSIS — I255 Ischemic cardiomyopathy: Secondary | ICD-10-CM | POA: Diagnosis not present

## 2019-01-16 DIAGNOSIS — I5022 Chronic systolic (congestive) heart failure: Secondary | ICD-10-CM

## 2019-01-16 NOTE — Progress Notes (Signed)
Electrophysiology TeleHealth Note   Due to national recommendations of social distancing due to COVID 19, an audio/video telehealth visit is felt to be most appropriate for this patient at this time.  See MyChart message from today for the patient's consent to telehealth for [facility].   Date:  01/16/2019   ID:  [name], DOB 29-Jan-1956, MRN [id number]  Location: patient's home  Provider location: [address], [place]  Evaluation Performed: Follow-up visit  PCP:  Geoffry ParadiseAronson, Richard, MD  Cardiologist:   PJ Electrophysiologist:  SK   Chief Complaint:  VT  History of Present Illness:    Colton NineLeslie A Weaver is a 63 y.o. male who presents via audio/video conferencing for a telehealth visit today. The patient did not have access to video technology/had technical difficulties with video requiring transitioning to audio format only (telephone).  All issues noted in this document were discussed and addressed.  No physical exam could be performed with this format.      Since last being seen in our clinic, the patient reports doing better  No more diarrhea,  Still tired but " feels good" and better off amio   He has CRT-D implanted for primary prevention April 2013 for ischemic cardiomyopathy and prior myocardial infarction and right bundle branch block. He underwent device generator reimplantation 2/16  He was admitted 10/17 w VT storm- >> sotalol  RFCA VT 4/20 >> amio  Stopped 8/20 2/2 sideeffects  -diarrhea /tremor       DATE TEST EF   3/13 echo  40-45%   3/16 echo  30-35%   10/17 cath  35-40% LIMA/SVG OM2-PDA -- patent , severe native disease  1/18 Echo  40-45%     Date Cr Mg K LDL LFTs TSH  10.13   1.9       11/17  1.49  4.8     3/18 1.67 1.4 4.3 183    12/18 1.48  5.0     4/20 1.5 1.7 4.6  15 2.97      Now being seen off amiodarone and no interval VT No energy, and tremor unchanged       The patient  denies symptoms of fevers, chills, cough, or new SOB worrisome for COVID 19.   Past Medical History:  Diagnosis Date  . AICD (automatic cardioverter/defibrillator) present   . Anemia   . Arthritis    "mild in my joints & knuckles" (05/10/2016)  . CHF (congestive heart failure) (HCC)   . Coronary artery disease   . Hemochromatosis   . Hyperlipidemia   . Ischemic cardiomyopathy    EF 36%; prior anterior MI, s/p CABG x 3; s/p cath Feb 2013 showing grafts to be patent with severe LV dysfunction  . Myocardial infarction (HCC) 2006  . PAF (paroxysmal atrial fibrillation) (HCC)   . Pneumonia 2000  . RBBB (right bundle branch block)   . Sleep apnea    "can't tolerate mask" (05/10/2016)  . Type II diabetes mellitus (HCC)   . Ventricular tachycardia, 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    Past Surgical History:  Procedure Laterality Date  . BI-VENTRICULAR IMPLANTABLE CARDIOVERTER DEFIBRILLATOR N/A 08/21/2011   Procedure: BI-VENTRICULAR IMPLANTABLE CARDIOVERTER DEFIBRILLATOR  (CRT-D);  Surgeon: Duke SalviaSteven C Richy Spradley, MD;  [facility];  Service: Cardiovascular;  Laterality: N/A;  . BIV ICD GENERTAOR CHANGE OUT N/A 06/12/2014   Procedure: BIV ICD GENERTAOR CHANGE OUT;  Surgeon: Duke SalviaSteven C Eithen Castiglia, MD;  [facility];  Service:  Cardiovascular;  Laterality: N/A;  . CARDIAC CATHETERIZATION N/A 01/19/2016   Procedure: Left Heart Cath and Cors/Grafts Angiography;  Surgeon: Peter M Swaziland, MD;  Location: [facility] INVASIVE CV LAB;  Service: Cardiovascular;  Laterality: N/A;  . CORONARY ARTERY BYPASS GRAFT  2006   Emergent CABG x 3 with LIMA to LAD, SVG to OM, SVG to distal RCA per Dr. Tyrone Sage  . FRACTURE SURGERY    . INGUINAL HERNIA REPAIR  02/09/2012   Procedure: HERNIA REPAIR INGUINAL ADULT;  Surgeon: Cherylynn Ridges, MD;  Location: [facility] OR;  Service: General;  Laterality: Right;  . INGUINAL HERNIA REPAIR Bilateral [id number]  . INSERTION OF MESH  02/09/2012   Procedure: INSERTION OF MESH;  Surgeon: Cherylynn Ridges, MD;  Location: [facility] OR;  Service: General;  Laterality: Right;  . LEFT HEART CATHETERIZATION WITH CORONARY/GRAFT ANGIOGRAM N/A 05/25/2011   Procedure: LEFT HEART CATHETERIZATION WITH Isabel Caprice;  Surgeon: Peter M Swazil[place], MD;  Location: [facility] CATH LAB;  Service: Cardiovascular;  Laterality: N/A;  . LIVER BIOPSY  1990s  . SHOULDER OPEN ROTATOR CUFF REPAIR Bilateral ?2000/~ 2007   left; right   . V TACH ABLATION N/A 06/08/2016   Procedure: V Tach Ablation;  Surgeon: Hillis Range, MD;  Location: [facility] INVASIVE CV LAB;  Service: Cardiovascular;  Laterality: N/A;  . VENTRICULAR ABLATION SURGERY  06/08/2016  . WRIST FRACTURE SURGERY Left 06/2005   "plates and screws and cadavaer bone"    Current Outpatient Medications  Medication Sig Dispense Refill  . aspirin 81 MG tablet Take 81 mg by mouth daily.    . carvedilol (COREG) 25 MG tablet Take 1 tablet (25 mg total) by mouth 2 (two) times daily with a meal. 180 tablet 3  . fenofibrate 160 MG tablet TAKE 1 TABLET (160 MG TOTAL) BY MOUTH DAILY. 90 tablet 3  . furosemide (LASIX) 20 MG tablet TAKE 1/2 TABLET BY MOUTH DAILY. 30 tablet 3  . lisinopril (ZESTRIL) 5 MG tablet Take 5 mg by mouth daily.    . magnesium oxide (MAG-OX) 400 (241.3 Mg) MG tablet TAKE 1 TABLET BY MOUTH 2 TIMES DAILY. 180 tablet 0  . metFORMIN (GLUCOPHAGE) 500 MG tablet Take 1,000 mg by mouth 2 (two) times daily with a meal.     . NITROSTAT 0.4 MG SL tablet PLACE 1 TABLET UNDER THE TONGUE EVERY 5 MINUTES AS NEEDED FOR CHEST PAIN FOR UP TO 3 DOSES. 25 tablet 3  . Omega-3 Fatty Acids (RA FISH OIL) 1400 MG CPDR Take 1,400 mg by mouth 2 (two) times daily.    Letta Pate VERIO test strip 1 each by Other route as needed for other.   6  . spironolactone (ALDACTONE) 25 MG tablet Take 1 tablet (25 mg total) by mouth daily. 90 tablet 3   No current facility-administered medications for this visit.     Allergies:   Entresto [sacubitril-valsartan] and Statins   Social History:  The  patient  reports that he quit smoking about 13 years ago. His smoking use included cigarettes. He has a 60.00 pack-year smoking history. He has never used smokeless tobacco. He reports current alcohol use of about 3.0 standard drinks of alcohol per week. He reports that he does not use drugs.   Family History:  The patient's   family history is not on file. He was adopted.   ROS:  Please see the history of present illness.   All other systems are personally reviewed and negative.    Exam:  Vital Signs:  BP 107/60   Pulse 67   Ht 6' (1.829 m)   Wt 206 lb 6.4 oz (93.6 kg)   BMI 27.99 kg/m         Labs/Other Tests and Data Reviewed:    Recent Labs: 07/30/2018: BUN 24; Creatinine, Ser 1.50; Magnesium 1.7; Potassium 4.6; Sodium 137 09/03/2018: ALT 16; TSH 2.970   Wt Readings from Last 3 Encounters:  01/16/19 206 lb 6.4 oz (93.6 kg)  12/26/18 206 lb (93.4 kg)  12/02/18 200 lb 9.6 oz (91 kg)     Other studies personally reviewed:    Last device remote is reviewed from [employer] PDF dated 9/20  which reveals normal device function,   arrhythmias - none     ASSESSMENT & PLAN:   Ischemic cardiomyopathy  VT storm s/p ablation   CRT-[employer]tr[employer]   RBBB  Sinus node dysfunction  Congestive heart failure-chronic systolic  High output LV capture threshold  Renal insuff grade 3  Hyperlipidemia  Hypomagnesemia  Diarrhea  Tremor   Tremor unchanged  Diarrhea resolved off amio  No intercurrent Ventricular tachycardia  Without symptoms of ischemia     COVID 19 screen The patient denies symptoms of COVID 19 at this time.  The importance of social distancing was discussed today.  Follow-up:  103m telehealth visit   Next remote: As Scheduled   Current medicines are reviewed at length with the patient today.   The patient does not have concerns regarding his medicines.  The following changes were made today:  none  Labs/ tests ordered today include:    No orders of the defined types were placed in this encounter.   Future tests ( post COVID )     Patient Risk:  after full review of this patients clinical status, I feel that they are at moderate risk at this time.  Today, I have spent 7* minutes with the patient with telehealth technology discussing the above.  Signed, Virl Axe, MD  01/16/2019 2:48 PM     [facility] [address] [place] [address] [phone] (office) [phone] (fax)

## 2019-04-03 ENCOUNTER — Ambulatory Visit (INDEPENDENT_AMBULATORY_CARE_PROVIDER_SITE_OTHER): Payer: Medicare Other | Admitting: *Deleted

## 2019-04-03 DIAGNOSIS — I5022 Chronic systolic (congestive) heart failure: Secondary | ICD-10-CM | POA: Diagnosis not present

## 2019-04-04 LAB — CUP PACEART REMOTE DEVICE CHECK
Battery Remaining Longevity: 40 mo
Battery Voltage: 2.97 V
Brady Statistic AP VP Percent: 28.21 %
Brady Statistic AP VS Percent: 0.02 %
Brady Statistic AS VP Percent: 71.47 %
Brady Statistic AS VS Percent: 0.3 %
Brady Statistic RA Percent Paced: 27.58 %
Brady Statistic RV Percent Paced: 97.11 %
Date Time Interrogation Session: 20201224031604
HighPow Impedance: 66 Ohm
Implantable Lead Implant Date: 20130513
Implantable Lead Implant Date: 20130513
Implantable Lead Implant Date: 20130513
Implantable Lead Location: 753858
Implantable Lead Location: 753859
Implantable Lead Location: 753860
Implantable Lead Model: 181
Implantable Lead Model: 4396
Implantable Lead Model: 5076
Implantable Lead Serial Number: 316784
Implantable Pulse Generator Implant Date: 20160304
Lead Channel Impedance Value: 399 Ohm
Lead Channel Impedance Value: 437 Ohm
Lead Channel Impedance Value: 437 Ohm
Lead Channel Impedance Value: 456 Ohm
Lead Channel Impedance Value: 532 Ohm
Lead Channel Impedance Value: 779 Ohm
Lead Channel Pacing Threshold Amplitude: 0.75 V
Lead Channel Pacing Threshold Amplitude: 0.875 V
Lead Channel Pacing Threshold Pulse Width: 0.4 ms
Lead Channel Pacing Threshold Pulse Width: 0.4 ms
Lead Channel Sensing Intrinsic Amplitude: 1.25 mV
Lead Channel Sensing Intrinsic Amplitude: 12.875 mV
Lead Channel Setting Pacing Amplitude: 2 V
Lead Channel Setting Pacing Amplitude: 2.5 V
Lead Channel Setting Pacing Pulse Width: 0.4 ms
Lead Channel Setting Sensing Sensitivity: 0.45 mV

## 2019-04-07 ENCOUNTER — Other Ambulatory Visit: Payer: Self-pay | Admitting: Internal Medicine

## 2019-04-16 ENCOUNTER — Telehealth: Payer: Self-pay | Admitting: Internal Medicine

## 2019-04-16 NOTE — Telephone Encounter (Signed)
Spoke with patient about Repatha. He reports that after reading SE, he decided not to take this medication. Explained I was not aware of this and apologized for call requesting that he have labs done. Will update MD.

## 2019-04-16 NOTE — Telephone Encounter (Signed)
Per CMM, prior auth for repatha needs to be submitted, expired 04/11/2019 Patient will need labs completed

## 2019-04-16 NOTE — Telephone Encounter (Signed)
Thanks .. up to him I guess.  Dr Rexene Edison

## 2019-05-06 ENCOUNTER — Encounter: Payer: Self-pay | Admitting: Internal Medicine

## 2019-05-06 ENCOUNTER — Other Ambulatory Visit: Payer: Self-pay

## 2019-05-06 ENCOUNTER — Telehealth (INDEPENDENT_AMBULATORY_CARE_PROVIDER_SITE_OTHER): Payer: Medicare Other | Admitting: Internal Medicine

## 2019-05-06 VITALS — BP 113/64 | HR 70 | Ht 72.0 in | Wt 208.4 lb

## 2019-05-06 DIAGNOSIS — I472 Ventricular tachycardia, unspecified: Secondary | ICD-10-CM

## 2019-05-06 DIAGNOSIS — I4510 Unspecified right bundle-branch block: Secondary | ICD-10-CM | POA: Diagnosis not present

## 2019-05-06 DIAGNOSIS — I5022 Chronic systolic (congestive) heart failure: Secondary | ICD-10-CM

## 2019-05-06 DIAGNOSIS — I480 Paroxysmal atrial fibrillation: Secondary | ICD-10-CM

## 2019-05-06 DIAGNOSIS — I255 Ischemic cardiomyopathy: Secondary | ICD-10-CM

## 2019-05-06 NOTE — Patient Instructions (Addendum)
Phone call to pt and provided information below.  Pt verbalizes understanding and agrees with plan.  Medication Instructions:  Your physician recommends that you continue on your current medications as directed. Please refer to the Current Medication list given to you today.  *If you need a refill on your cardiac medications before your next appointment, please call your pharmacy*  Lab Work: Your physician recommends that you return for a BMET: the day you come in for Echo.   If you have labs (blood work) drawn today and your tests are completely normal, you will receive your results only by: Marland Kitchen MyChart Message (if you have MyChart) OR . A paper copy in the mail If you have any lab test that is abnormal or we need to change your treatment, we will call you to review the results.  Testing/Procedures: Echocardiogram - someone will call you to schedule this.  Your physician has requested that you have an echocardiogram. Echocardiography is a painless test that uses sound waves to create images of your heart. It provides your doctor with information about the size and shape of your heart and how well your heart's chambers and valves are working. This procedure takes approximately one hour. There are no restrictions for this procedure.     Follow-Up: At [facility], you and your health needs are our priority.  As part of our continuing mission to provide you with exceptional heart care, we have created designated Provider Care Teams.  These Care Teams include your primary Cardiologist (physician) and Advanced Practice Providers (APPs -  Physician Assistants and Nurse Practitioners) who all work together to provide you with the care you need, when you need it.  Your next appointment:  Your physician recommends that you schedule a follow-up appointment in: 6 months with Dr Graciela Husbands.

## 2019-05-06 NOTE — Progress Notes (Signed)
Electrophysiology TeleHealth Note   Due to national recommendations of social distancing due to COVID 19, an audio/video telehealth visit is felt to be most appropriate for this patient at this time.  See MyChart message from today for the patient's consent to telehealth for [facility].   Date:  05/06/2019   ID:  [name], DOB Jun 10, 1955, MRN [id number]  Location: patient's home  Provider location: [address], [place]  Evaluation Performed: Follow-up visit  PCP:  Burnard Bunting, MD  Cardiologist:    Electrophysiologist:  SK   Chief Complaint:  CHF  History of Present Illness:    [name] is a 64 y.o. male who presents via audio/video conferencing for a telehealth visit today.  Since last being seen in our clinic, the patient reports doing about the same.  His tremor is unchanged.  Dyspnea is about the same.  Dyspnea is unassociated with chest discomfort.  No nocturnal dyspnea or orthopnea.  No peripheral edema.  He was admitted 10/17 w VT storm- >> sotalol  RFCA  VT 4/20 >> amio  Stopped 8/20 2/2 side effects  -diarrhea /tremor  No interval ventricular tachycardia.  He has CRT-D implanted for primary prevention April 2013 for ischemic cardiomyopathy and prior myocardial infarction and right bundle branch block. He underwent device generator reimplantation 2/16      DATE TEST EF   3/13 echo  40-45%   3/16 echo  30-35%   10/17 cath 35-40% LIMA/SVG OM2-PDA -- patent , severe native disease  1/18 Echo 40-45%          Date Cr Mg K LDL LFTs TSH  10.13   1.9       11/17  1.49  4.8     3/18 1.67 1.4 4.3 183    12/18 1.48  5.0     4/20 1.5 1.7 4.6  15 2.97               The patient denies symptoms of fevers, chills, cough, or new SOB worrisome for COVID 19.    Past Medical History:  Diagnosis Date  . AICD (automatic cardioverter/defibrillator) present   . Anemia   . Arthritis    "mild  in my joints & knuckles" (05/10/2016)  . CHF (congestive heart failure) ([place])   . Coronary artery disease   . Hemochromatosis   . Hyperlipidemia   . Ischemic cardiomyopathy    EF 36%; prior anterior MI, s/p CABG x 3; s/p cath Feb 2013 showing grafts to be patent with severe LV dysfunction  . Myocardial infarction ([place]) 2006  . PAF (paroxysmal atrial fibrillation) ([place])   . Pneumonia 2000  . RBBB (right bundle branch block)   . Sleep apnea    "can't tolerate mask" (05/10/2016)  . Type II diabetes mellitus ([place])   . Ventricular tachycardia, sustained ([place]) 01/19/2016    Past Surgical History:  Procedure Laterality Date  . BI-VENTRICULAR IMPLANTABLE CARDIOVERTER DEFIBRILLATOR N/A 08/21/2011   Procedure: BI-VENTRICULAR IMPLANTABLE CARDIOVERTER DEFIBRILLATOR  (CRT-D);  Surgeon: Deboraha Sprang, MD;  Location: [facility] CATH LAB;  Service: Cardiovascular;  Laterality: N/A;  . BIV ICD GENERTAOR CHANGE OUT N/A 06/12/2014   Procedure: BIV ICD GENERTAOR CHANGE OUT;  Surgeon: Deboraha Sprang, MD;  Location: [facility] CATH LAB;  Service: Cardiovascular;  Laterality: N/A;  . CARDIAC CATHETERIZATION N/A 01/19/2016   Procedure: Left Heart Cath and Cors/Grafts Angiography;  Surgeon: Peter M Martinique, MD;  Location: [facility] CV LAB;  Service: Cardiovascular;  Laterality: N/A;  . CORONARY ARTERY BYPASS GRAFT  2006   Emergent CABG x 3 with LIMA to LAD, SVG to OM, SVG to distal RCA per Dr. Tyrone Sage  . FRACTURE SURGERY    . INGUINAL HERNIA REPAIR  02/09/2012   Procedure: HERNIA REPAIR INGUINAL ADULT;  Surgeon: Cherylynn Ridges, MD;  Location: [facility] OR;  Service: General;  Laterality: Right;  . INGUINAL HERNIA REPAIR Bilateral [id number]  . INSERTION OF MESH  02/09/2012   Procedure: INSERTION OF MESH;  Surgeon: Cherylynn Ridges, MD;  Location: [facility] OR;  Service: General;  Laterality: Right;  . LEFT HEART CATHETERIZATION WITH CORONARY/GRAFT ANGIOGRAM N/A 05/25/2011   Procedure: LEFT HEART CATHETERIZATION WITH Isabel Caprice;   Surgeon: Peter M Swaziland, MD;  Location: [facility] CATH LAB;  Service: Cardiovascular;  Laterality: N/A;  . LIVER BIOPSY  1990s  . SHOULDER OPEN ROTATOR CUFF REPAIR Bilateral ?2000/~ 2007   left; right   . V TACH ABLATION N/A 06/08/2016   Procedure: V Tach Ablation;  Surgeon: Hillis Range, MD;  Location: [facility] INVASIVE CV LAB;  Service: Cardiovascular;  Laterality: N/A;  . VENTRICULAR ABLATION SURGERY  06/08/2016  . WRIST FRACTURE SURGERY Left 06/2005   "plates and screws and cadavaer bone"    Current Outpatient Medications  Medication Sig Dispense Refill  . aspirin 81 MG tablet Take 81 mg by mouth daily.    . carvedilol (COREG) 25 MG tablet Take 1 tablet (25 mg total) by mouth 2 (two) times daily with a meal. 180 tablet 3  . fenofibrate 160 MG tablet TAKE 1 TABLET (160 MG TOTAL) BY MOUTH DAILY. 90 tablet 3  . furosemide (LASIX) 20 MG tablet TAKE 1/2 TABLET BY MOUTH DAILY. 30 tablet 3  . lisinopril (ZESTRIL) 5 MG tablet Take 5 mg by mouth daily.    . magnesium oxide (MAG-OX) 400 MG tablet Take 1 tablet by mouth 2 (two) times daily.    . metFORMIN (GLUCOPHAGE) 500 MG tablet Take 1,000 mg by mouth 2 (two) times daily with a meal.     . NITROSTAT 0.4 MG SL tablet PLACE 1 TABLET UNDER THE TONGUE EVERY 5 MINUTES AS NEEDED FOR CHEST PAIN FOR UP TO 3 DOSES. 25 tablet 3  . ONETOUCH VERIO test strip 1 each by Other route as needed for other.   6  . spironolactone (ALDACTONE) 25 MG tablet Take 1 tablet (25 mg total) by mouth daily. 90 tablet 3   No current facility-administered medications for this visit.    Allergies:   Entresto [sacubitril-valsartan] and Statins   Social History:  The patient  reports that he quit smoking about 14 years ago. His smoking use included cigarettes. He has a 60.00 pack-year smoking history. He has never used smokeless tobacco. He reports current alcohol use of about 3.0 standard drinks of alcohol per week. He reports that he does not use drugs.   Family History:  The patient's    family history is not on file. He was adopted.   ROS:  Please see the history of present illness.   All other systems are personally reviewed and negative.    Exam:    Vital Signs:  BP 113/64   Pulse 70   Ht 6' (1.829 m)   Wt 208 lb 6.4 oz (94.5 kg)   BMI 28.26 kg/m     Well appearing, alert and conversant, regular work of breathing,  good skin color Eyes- anicteric, neuro- grossly intact, skin- no apparent rash or  lesions or cyanosis, mouth- oral mucosa is pink   Labs/Other Tests and Data Reviewed:    Recent Labs: 07/30/2018: BUN 24; Creatinine, Ser 1.50; Magnesium 1.7; Potassium 4.6; Sodium 137 09/03/2018: ALT 16; TSH 2.970   Wt Readings from Last 3 Encounters:  05/06/19 208 lb 6.4 oz (94.5 kg)  01/16/19 206 lb 6.4 oz (93.6 kg)  12/26/18 206 lb (93.4 kg)     Other studies personally reviewed:    Last device remote is reviewed from Pace[employer] PDF dated 12/20 which reveals normal device function,   arrhythmias - VT NS with at least four different morphologies  3/20 VT monomorphic CL 250 msec  Terminated via ATP while charging  ASSESSMENT & PLAN:   Ischemic cardiomyopathy  VT storm s/p ablation   CRT-Medtronic   RBBB  Sinus node dysfunction  Congestive heart failure-chronic systolic  High output LV capture threshold  Renal insuff grade 3  Hyperlipidemia   Tremor   Tremor is stable--with intention primarily   Without symptoms of ischemia  Orthostasis    Euvolemic continue current meds     COVID 19 screen The patient denies symptoms of COVID 19 at this time.  The importance of social distancing was discussed today.  Follow-up:  6 Month inoffice  Next remote: As Scheduled   Current medicines are reviewed at length with the patient today.   The patient does not have concerns regarding his medicines.  The following changes were made today:  none  Labs/ tests ordered today include  BMET ; echo No orders of the defined types were placed  in this encounter.   Future tests ( post COVID )     Patient Risk:  after full review of this patients clinical status, I feel that they are at moderate risk at this time.  Today, I have spent 12 minutes with the patient with telehealth technology discussing the above.  Signed, Sherryl Manges, MD  05/06/2019 2:07 PM     [facility] [address] [place] [address] [phone] (office) [phone] (fax)

## 2019-05-19 ENCOUNTER — Other Ambulatory Visit: Payer: Self-pay

## 2019-05-19 ENCOUNTER — Other Ambulatory Visit: Payer: Medicare Other | Admitting: *Deleted

## 2019-05-19 ENCOUNTER — Ambulatory Visit (HOSPITAL_COMMUNITY): Payer: Medicare Other | Attending: Cardiovascular Disease

## 2019-05-19 DIAGNOSIS — I255 Ischemic cardiomyopathy: Secondary | ICD-10-CM

## 2019-05-19 DIAGNOSIS — I5022 Chronic systolic (congestive) heart failure: Secondary | ICD-10-CM | POA: Diagnosis present

## 2019-05-19 DIAGNOSIS — I480 Paroxysmal atrial fibrillation: Secondary | ICD-10-CM | POA: Insufficient documentation

## 2019-05-19 DIAGNOSIS — I4510 Unspecified right bundle-branch block: Secondary | ICD-10-CM | POA: Insufficient documentation

## 2019-05-19 DIAGNOSIS — I472 Ventricular tachycardia, unspecified: Secondary | ICD-10-CM

## 2019-05-19 LAB — BASIC METABOLIC PANEL
BUN/Creatinine Ratio: 12 (ref 10–24)
BUN: 22 mg/dL (ref 8–27)
CO2: 19 mmol/L — ABNORMAL LOW (ref 20–29)
Calcium: 9.1 mg/dL (ref 8.6–10.2)
Chloride: 102 mmol/L (ref 96–106)
Creatinine, Ser: 1.77 mg/dL — ABNORMAL HIGH (ref 0.76–1.27)
GFR calc Af Amer: 46 mL/min/{1.73_m2} — ABNORMAL LOW (ref >59)
GFR calc non Af Amer: 40 mL/min/{1.73_m2} — ABNORMAL LOW (ref >59)
Glucose: 149 mg/dL — ABNORMAL HIGH (ref 65–99)
Potassium: 4.5 mmol/L (ref 3.5–5.2)
Sodium: 137 mmol/L (ref 134–144)

## 2019-05-19 MED ORDER — PERFLUTREN LIPID MICROSPHERE
1.0000 mL | INTRAVENOUS | Status: AC | PRN
  Administered 2019-05-19: 10:00:00 2 mL via INTRAVENOUS

## 2019-05-21 ENCOUNTER — Other Ambulatory Visit: Payer: Self-pay | Admitting: Cardiology

## 2019-05-28 ENCOUNTER — Other Ambulatory Visit: Payer: Self-pay | Admitting: Internal Medicine

## 2019-05-30 NOTE — Progress Notes (Signed)
[name] Date of Birth: 1955-04-19 Medical Record #[id number]  History of Present Illness: [name] is seen for follow up CAD and CHF.  He has an ischemic CM and has CRT-D in place for primary prevention since April of 2013. EF by Echo was 45-50% at that time. Cardiac MRI showed an EF of 31%. Other issues include HLD (statin intolerant and intolerant to zetia), hemochromatosis, RBBB, PAF, type 2 DM and OSA. He is s/p CABG in 2006 following an MI. Cardiac cath in February 2013 showed patrent grafts. After CRT therapy he did have a significant improvement in dyspnea.   He was admitted in October 2017 with VT and received shocks. Cardiac cath demonstrated patent grafts. He was started on sotalol. In January 2018 he presented with syncope and recurrent VT. Switched to amiodarone. Was concerned about taking amiodarone long term. Underwent VT ablation by Dr. Johney Frame on June 08, 2016. Amiodarone later discontinued.  Last device check in Decmber showed 3 episodes of VT that were pace terminated. Approx. 15 runs of NSVT.  Last Echo in January 2018 showed EF 40-45%.  He is intolerant of niacin, statins (Simcor and Vytorin) and Zetia. Was seen by pharmacy at [facility]. And considered for PCSK 9 inhibitor but he is really not interested in this- I think mainly due to cost and reaction to prior cholesterol meds.   On his last visit we attempted to initiate him on Entresto. Lisinopril was washed out. Lasix changed to prn. Even at lower dose he was unable to tolerate Entresto due to hypotension with pressures in the 80s systolic and symptoms of lightheadedness and dizziness. Lisinopril resumed.   He had a televisit in January 2021 with Dr Graciela Husbands. Asymptomatic. Echo ordered showing a lower EF than before.   On follow up today he is doing well. No symptomatic VT.  Notes he runs out of air  when doing strenuous exertion such as splitting or carrying wood but otherwise denies any SOB with daily activities. No chest  pain. No edema. Weight is up 5-7 lbs at home which he attributes to less activity and more eating this winter.    Current Outpatient Medications  Medication Sig Dispense Refill  . aspirin 81 MG tablet Take 81 mg by mouth daily.    . carvedilol (COREG) 25 MG tablet TAKE 1 TABLET BY MOUTH 2 TIMES DAILY WITH A MEAL. 180 tablet 3  . fenofibrate 160 MG tablet TAKE 1 TABLET (160 MG TOTAL) BY MOUTH DAILY. 90 tablet 3  . furosemide (LASIX) 20 MG tablet TAKE 1/2 TABLET BY MOUTH DAILY. 45 tablet 1  . lisinopril (ZESTRIL) 5 MG tablet Take 1 tablet (5 mg total) by mouth in the morning and at bedtime. 180 tablet 3  . magnesium oxide (MAG-OX) 400 MG tablet Take 1 tablet by mouth 2 (two) times daily.    . metFORMIN (GLUCOPHAGE) 500 MG tablet Take 1,000 mg by mouth 2 (two) times daily with a meal.     . NITROSTAT 0.4 MG SL tablet PLACE 1 TABLET UNDER THE TONGUE EVERY 5 MINUTES AS NEEDED FOR CHEST PAIN FOR UP TO 3 DOSES. 25 tablet 3  . ONETOUCH VERIO test strip 1 each by Other route as needed for other.   6  . spironolactone (ALDACTONE) 25 MG tablet Take 1 tablet (25 mg total) by mouth daily. 90 tablet 3   No current facility-administered medications for this visit.    Allergies  Allergen Reactions  . Entresto [Sacubitril-Valsartan] Other (See Comments)  Hypotension, increased heart rate, dizziness, blurry vision  . Statins Other (See Comments)    Myalgias     Past Medical History:  Diagnosis Date  . AICD (automatic cardioverter/defibrillator) present   . Anemia   . Arthritis    "mild in my joints & knuckles" (05/10/2016)  . CHF (congestive heart failure) ([facility])   . Coronary artery disease   . Hemochromatosis   . Hyperlipidemia   . Ischemic cardiomyopathy    EF 36%; prior anterior MI, s/p CABG x 3; s/p cath Feb 2013 showing grafts to be patent with severe LV dysfunction  . Myocardial infarction ([facility]) 2006  . PAF (paroxysmal atrial fibrillation) ([facility])   . Pneumonia 2000  . RBBB (right bundle  branch block)   . Sleep apnea    "can't tolerate mask" (05/10/2016)  . Type II diabetes mellitus ([facility])   . Ventricular tachycardia, 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    Past Surgical History:  Procedure Laterality Date  . BI-VENTRICULAR IMPLANTABLE CARDIOVERTER DEFIBRILLATOR N/A 08/21/2011   Procedure: BI-VENTRICULAR IMPLANTABLE CARDIOVERTER DEFIBRILLATOR  (CRT-D);  Surgeon: Duke Salvia, MD;  Location: [facility] CATH LAB;  Service: Cardiovascular;  Laterality: N/A;  . BIV ICD GENERTAOR CHANGE OUT N/A 06/12/2014   Procedure: BIV ICD GENERTAOR CHANGE OUT;  Surgeon: Duke Salvia, MD;  Location: [facility] CATH LAB;  Service: Cardiovascular;  Laterality: N/A;  . CARDIAC CATHETERIZATION N/A 01/19/2016   Procedure: Left Heart Cath and Cors/Grafts Angiography;  Surgeon: Edrik Rundle M Swaziland, MD;  Location: [facility] INVASIVE CV LAB;  Service: Cardiovascular;  Laterality: N/A;  . CORONARY ARTERY BYPASS GRAFT  2006   Emergent CABG x 3 with LIMA to LAD, SVG to OM, SVG to distal RCA per Dr. Tyrone Sage  . FRACTURE SURGERY    . INGUINAL HERNIA REPAIR  02/09/2012   Procedure: HERNIA REPAIR INGUINAL ADULT;  Surgeon: Cherylynn Ridges, MD;  Location: [facility] OR;  Service: General;  Laterality: Right;  . INGUINAL HERNIA REPAIR Bilateral [id number]  . INSERTION OF MESH  02/09/2012   Procedure: INSERTION OF MESH;  Surgeon: Cherylynn Ridges, MD;  Location: [facility] OR;  Service: General;  Laterality: Right;  . LEFT HEART CATHETERIZATION WITH CORONARY/GRAFT ANGIOGRAM N/A 05/25/2011   Procedure: LEFT HEART CATHETERIZATION WITH Isabel Caprice;  Surgeon: Boysie Bonebrake M [place]az[place], MD;  Location: [facility] CATH LAB;  Service: Cardiovascular;  Laterality: N/A;  . LIVER BIOPSY  1990s  . SHOULDER OPEN ROTATOR CUFF REPAIR Bilateral ?2000/~ 2007   left; right   . V TACH ABLATION N/A 06/08/2016   Procedure: V Tach Ablation;  Surgeon: Hillis Range, MD;  Location: [facility] INVASIVE CV LAB;  Service: Cardiovascular;  Laterality: N/A;  . VENTRICULAR ABLATION SURGERY  06/08/2016  . WRIST  FRACTURE SURGERY Left 06/2005   "plates and screws and cadavaer bone"    Social History   Tobacco Use  Smoking Status Former Smoker  . Packs/day: 2.00  . Years: 30.00  . Pack years: 60.00  . Types: Cigarettes  . Quit date: 03/11/2005  . Years since quitting: 14.2  Smokeless Tobacco Never Used    Social History   Substance and Sexual Activity  Alcohol Use Yes  . Alcohol/week: 3.0 standard drinks  . Types: 3 Cans of beer per week    Family History  Adopted: Yes    Review of Systems: The review of systems is per the HPI.  All other systems were reviewed and are negative.  Physical Exam: BP 124/75   Pulse 80   Temp (!) 96.3 F (35.7  C)   Ht 6' (1.829 m)   Wt 217 lb (98.4 kg)   SpO2 96%   BMI 29.43 kg/m  GENERAL:  Well appearing WM in NAD HEENT:  PERRL, EOMI, sclera are clear. Oropharynx is clear. NECK:  No jugular venous distention, carotid upstroke brisk and symmetric, no bruits, no thyromegaly or adenopathy LUNGS:  Clear to auscultation bilaterally CHEST:  Unremarkable HEART:  RRR,  PMI not displaced or sustained,S1 and S2 within normal limits, no S3, no S4: no clicks, no rubs, no murmurs ABD:  Soft, nontender. BS +, no masses or bruits. No hepatomegaly, no splenomegaly EXT:  2 + pulses throughout, no edema, no cyanosis no clubbing SKIN:  Warm and dry.  No rashes NEURO:  Alert and oriented x 3. Cranial nerves II through XII intact. PSYCH:  Cognitively intact       Wt Readings from Last 3 Encounters:  06/03/19 217 lb (98.4 kg)  05/06/19 208 lb 6.4 oz (94.5 kg)  01/16/19 206 lb 6.4 oz (93.6 kg)   LABORATORY DATA:  PENDING  Lab Results  Component Value Date   WBC 5.5 06/02/2016   HGB 12.0 (L) 06/02/2016   HCT 34.8 (L) 06/02/2016   PLT 213 06/02/2016   GLUCOSE 149 (H) 05/19/2019   CHOL 259 (H) 05/11/2016   TRIG 185 (H) 05/11/2016   HDL 39 (L) 05/11/2016   LDLDIRECT 206.0 04/14/2013   LDLCALC 183 (H) 05/11/2016   ALT 16 09/03/2018   AST 15  09/03/2018   NA 137 05/19/2019   K 4.5 05/19/2019   CL 102 05/19/2019   CREATININE 1.77 (H) 05/19/2019   BUN 22 05/19/2019   CO2 19 (L) 05/19/2019   TSH 2.970 09/03/2018   INR 1.05 01/19/2016   HGBA1C 6.0 10/06/2010   Labs dated 07/28/16: cholesterol 285, triglycerides 252, HDL 41, LDL 194.  Dated 12/13/16: A1c 6.1%.  Dated 09/18/18: cholesterol 229, triglycerides 200, HDL 35, LDL 154. LFTs normal. CBC normal Dated 01/16/19: normal TSH Dated 05/19/19. A1c 5.9%  Echo 04/13/16: Study Conclusions  - Left ventricle: The cavity size was mildly dilated. Systolic   function was mildly to moderately reduced. The estimated ejection   fraction was in the range of 40% to 45%. Diffuse hypokinesis.   Akinesis of the apical myocardium. Doppler parameters are   consistent with abnormal left ventricular relaxation (grade 1   diastolic dysfunction). Doppler parameters are consistent with   indeterminate ventricular filling pressure. - Aortic valve: Transvalvular velocity was within the normal range.   There was no stenosis. There was no regurgitation. - Mitral valve: Transvalvular velocity was within the normal range.   There was no evidence for stenosis. There was trivial   regurgitation. - Right ventricle: The cavity size was normal. Wall thickness was   normal. - Tricuspid valve: There was trivial regurgitation. - Pulmonary arteries: Systolic pressure was within the normal   range. PA peak pressure: 25 mm Hg (S).  Cardiac cath 01/19/16: Procedures   Left Heart Cath and Cors/Grafts Angiography  Conclusion     Prox LAD to Mid LAD lesion, 85 %stenosed.  Ost Ramus to Ramus lesion, 55 %stenosed.  Prox Cx to Mid Cx lesion, 100 %stenosed.  Ost RCA to Dist RCA lesion, 100 %stenosed.  SVG graft was visualized by angiography and is small.  SVG graft was visualized by angiography and is small.  LIMA graft was visualized by angiography and is large and anatomically normal.  There is moderate  left ventricular systolic dysfunction.  LV end diastolic pressure is moderately elevated.  The left ventricular ejection fraction is 35-45% by visual estimate.   1. Severe 3 vessel obstructive CAD 2. Large patent LIMA to the LAD 3. Patent SVG to OM2. This is a tiny graft supplying a very small OM  4. Patent SVG to PDA. This is also a tiny graft supplying a very tiny PDA 5. Moderate LV dysfunction. 6. Elevated LV EDP  Plan: medical therapy. Antiarrhythmic drug therapy per EP team.    Echo 05/19/19: IMPRESSIONS    1. Left ventricular ejection fraction, by estimation, is 25 to 30%. The  left ventricle has severely decreased function. There is severe akinesis  of the left ventricular, mid-apical anteroseptal wall.  2. Right ventricular systolic function is severely reduced. The right  ventricular size is mildly enlarged.  3. Mild to moderate mitral valve regurgitation.   Assessment / Plan: 1. Chronic systolic CHF status post CRT. EF reported 40-45% in 2018  but I thought at the time that this was overestimated. Now EF reported 25-30%.  Clinically  class  II. He was unable to tolerate Entresto due to significant hypotension. Thoracic impedence in December was normal. I have recommended increasing lisinopril to 5 mg bid. Will repeat BMET in 2 weeks.   2. Ischemic cardiomyopathy. See above.  3. Coronary disease status post CABG in 2006. Cardiac catheterization in October 2017 showed all grafts were patent. Clinically without angina  4. Right bundle branch block.   5. Hyperlipidemia. Patient is intolerant to statins. He is on fenofibrate and fish oil only. Not really interested in pursuing PCSK 9 inhibitor now. Last LDL down to 154.   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  Off AAD therapy. ICD check in December showed 3 episodes of VT that were pace terminated and some runs of NSVT. No symptomatic episodes. No VT on device check  7. CKD last chemistry panel showed creatinine  increased to 1.77 with GFR 40. Likely related to DM. Will repeat in 2 weeks.   8. DM type 2. Based on renal function will need to reduce metformin to 500 mg bid. Would like to consider him for an SGLT2 inhibitor but this may also be limited by his renal function. Will need follow up with Dr Jacky Kindle to address options for treatment.   I will follow up in 4 months.

## 2019-06-03 ENCOUNTER — Encounter: Payer: Self-pay | Admitting: Cardiology

## 2019-06-03 ENCOUNTER — Ambulatory Visit: Payer: Medicare Other | Admitting: Cardiology

## 2019-06-03 ENCOUNTER — Other Ambulatory Visit: Payer: Self-pay

## 2019-06-03 VITALS — BP 124/75 | HR 80 | Temp 96.3°F | Ht 72.0 in | Wt 217.0 lb

## 2019-06-03 DIAGNOSIS — I480 Paroxysmal atrial fibrillation: Secondary | ICD-10-CM

## 2019-06-03 DIAGNOSIS — I255 Ischemic cardiomyopathy: Secondary | ICD-10-CM | POA: Diagnosis not present

## 2019-06-03 MED ORDER — LISINOPRIL 5 MG PO TABS: 5.0000 mg | ORAL_TABLET | Freq: Two times a day (BID) | ORAL | 3 refills | Status: DC

## 2019-06-03 NOTE — Patient Instructions (Signed)
Increase lisinopril to 5 mg twice a day  Reduce metformin to 500 mg twice a day  Repeat blood test in 2-3 weeks.

## 2019-06-17 LAB — BASIC METABOLIC PANEL
BUN/Creatinine Ratio: 15 (ref 10–24)
BUN: 24 mg/dL (ref 8–27)
CO2: 21 mmol/L (ref 20–29)
Calcium: 9.7 mg/dL (ref 8.6–10.2)
Chloride: 101 mmol/L (ref 96–106)
Creatinine, Ser: 1.63 mg/dL — ABNORMAL HIGH (ref 0.76–1.27)
GFR calc Af Amer: 51 mL/min/{1.73_m2} — ABNORMAL LOW (ref >59)
GFR calc non Af Amer: 44 mL/min/{1.73_m2} — ABNORMAL LOW (ref >59)
Glucose: 148 mg/dL — ABNORMAL HIGH (ref 65–99)
Potassium: 4.9 mmol/L (ref 3.5–5.2)
Sodium: 139 mmol/L (ref 134–144)

## 2019-07-03 ENCOUNTER — Ambulatory Visit (INDEPENDENT_AMBULATORY_CARE_PROVIDER_SITE_OTHER): Payer: Medicare Other | Admitting: *Deleted

## 2019-07-03 DIAGNOSIS — I5022 Chronic systolic (congestive) heart failure: Secondary | ICD-10-CM | POA: Diagnosis not present

## 2019-07-03 LAB — CUP PACEART REMOTE DEVICE CHECK
Battery Remaining Longevity: 37 mo
Battery Voltage: 2.97 V
Brady Statistic AP VP Percent: 18.41 %
Brady Statistic AP VS Percent: 0.01 %
Brady Statistic AS VP Percent: 81.26 %
Brady Statistic AS VS Percent: 0.33 %
Brady Statistic RA Percent Paced: 18.05 %
Brady Statistic RV Percent Paced: 97.22 %
Date Time Interrogation Session: 20210325012303
HighPow Impedance: 69 Ohm
Implantable Lead Implant Date: 20130513
Implantable Lead Implant Date: 20130513
Implantable Lead Implant Date: 20130513
Implantable Lead Location: 753858
Implantable Lead Location: 753859
Implantable Lead Location: 753860
Implantable Lead Model: 181
Implantable Lead Model: 4396
Implantable Lead Model: 5076
Implantable Lead Serial Number: 316784
Implantable Pulse Generator Implant Date: 20160304
Lead Channel Impedance Value: 399 Ohm
Lead Channel Impedance Value: 456 Ohm
Lead Channel Impedance Value: 456 Ohm
Lead Channel Impedance Value: 456 Ohm
Lead Channel Impedance Value: 589 Ohm
Lead Channel Impedance Value: 855 Ohm
Lead Channel Pacing Threshold Amplitude: 0.75 V
Lead Channel Pacing Threshold Amplitude: 0.875 V
Lead Channel Pacing Threshold Pulse Width: 0.4 ms
Lead Channel Pacing Threshold Pulse Width: 0.4 ms
Lead Channel Sensing Intrinsic Amplitude: 1.625 mV
Lead Channel Sensing Intrinsic Amplitude: 1.625 mV
Lead Channel Sensing Intrinsic Amplitude: 11.75 mV
Lead Channel Sensing Intrinsic Amplitude: 11.75 mV
Lead Channel Setting Pacing Amplitude: 2 V
Lead Channel Setting Pacing Amplitude: 2.5 V
Lead Channel Setting Pacing Pulse Width: 0.4 ms
Lead Channel Setting Sensing Sensitivity: 0.45 mV

## 2019-07-04 NOTE — Progress Notes (Signed)
ICD Remote  

## 2019-07-30 ENCOUNTER — Other Ambulatory Visit: Payer: Self-pay | Admitting: Cardiology

## 2019-09-18 NOTE — Progress Notes (Signed)
[name] Date of Birth: [birth year] Medical Record #[id number]  History of Present Illness: [name] is seen for follow up CAD and CHF.  He has an ischemic CM and has CRT-D in place for primary prevention since April of 2013. EF by Echo was 45-50% at that time. Cardiac MRI showed an EF of 31%. Other issues include HLD (statin intolerant and intolerant to zetia), hemochromatosis, RBBB, PAF, type 2 DM and OSA. He is s/p CABG in 2006 following an MI. Cardiac cath in February 2013 showed patrent grafts. After CRT therapy he did have a significant improvement in dyspnea.   He was admitted in October 2017 with VT and received shocks. Cardiac cath demonstrated patent grafts. He was started on sotalol. In January 2018 he presented with syncope and recurrent VT. Switched to amiodarone. Was concerned about taking amiodarone long term. Underwent VT ablation by Dr. Johney Weaver on June 08, 2016. Amiodarone later discontinued.  Last device check in Decmber showed 3 episodes of VT that were pace terminated. Approx. 15 runs of NSVT.  Last Echo in January 2018 showed EF 40-45%.  He is intolerant of niacin, statins (Simcor and Vytorin) and Zetia. Was seen by Dr [name] a year ago and approved for Repatha. Patient decided not to start at that time.    On a prior visit we attempted to initiate him on Entresto. Lisinopril was washed out. Lasix changed to prn. Even at lower dose he was unable to tolerate Entresto due to hypotension with pressures in the 80s systolic and symptoms of lightheadedness and dizziness. Lisinopril resumed.   He had a televisit in January 2021 with Dr [name]. Asymptomatic. Echo ordered showing a lower EF than before. Last device check 07/03/19 showed 9 episodes of NSVT. No device therapies.   On follow up today he is doing well. No symptomatic VT.  Notes he gets SOB  when doing strenuous exertion such as splitting or carrying wood but otherwise denies any SOB with daily activities. No chest pain. No  edema. Weight is stable.   Current Outpatient Medications  Medication Sig Dispense Refill  . aspirin 81 MG tablet Take 81 mg by mouth daily.    . carvedilol (COREG) 25 MG tablet TAKE 1 TABLET BY MOUTH 2 TIMES DAILY WITH A MEAL. 180 tablet 3  . fenofibrate 160 MG tablet TAKE 1 TABLET BY MOUTH DAILY. 90 tablet 3  . furosemide (LASIX) 20 MG tablet TAKE 1/2 TABLET BY MOUTH DAILY. 45 tablet 1  . lisinopril (ZESTRIL) 5 MG tablet Take 1 tablet (5 mg total) by mouth in the morning and at bedtime. 180 tablet 3  . magnesium oxide (MAG-OX) 400 MG tablet Take 1 tablet by mouth 2 (two) times daily.    . metFORMIN (GLUCOPHAGE) 500 MG tablet Take 500 mg by mouth 2 (two) times daily with a meal.    . NITROSTAT 0.4 MG SL tablet PLACE 1 TABLET UNDER THE TONGUE EVERY 5 MINUTES AS NEEDED FOR CHEST PAIN FOR UP TO 3 DOSES. 25 tablet 3  . ONETOUCH VERIO test strip 1 each by Other route as needed for other.   6  . spironolactone (ALDACTONE) 25 MG tablet TAKE 1 TABLET BY MOUTH DAILY. 90 tablet 3   No current facility-administered medications for this visit.    Allergies  Allergen Reactions  . Entresto [Sacubitril-Valsartan] Other (See Comments)    Hypotension, increased heart rate, dizziness, blurry vision  . Statins Other (See Comments)    Myalgias     Past  Medical History:  Diagnosis Date  . AICD (automatic cardioverter/defibrillator) present   . Anemia   . Arthritis    "mild in my joints & knuckles" (05/10/2016)  . CHF (congestive heart failure) ([name])   . Coronary artery disease   . Hemochromatosis   . Hyperlipidemia   . Ischemic cardiomyopathy    EF 36%; prior anterior MI, s/p CABG x 3; s/p cath Feb 2013 showing grafts to be patent with severe LV dysfunction  . Myocardial infarction ([name]) 2006  . PAF (paroxysmal atrial fibrillation) ([name])   . Pneumonia 2000  . RBBB (right bundle branch block)   . Sleep apnea    "can't tolerate mask" (05/10/2016)  . Type II diabetes mellitus ([place])   .  Ventricular tachycardia, sustained ([name]) 01/19/2016    Past Surgical History:  Procedure Laterality Date  . BI-VENTRICULAR IMPLANTABLE CARDIOVERTER DEFIBRILLATOR N/A 08/21/2011   Procedure: BI-VENTRICULAR IMPLANTABLE CARDIOVERTER DEFIBRILLATOR  (CRT-D);  Surgeon: Colton Sprang, MD;  Location: [name];  Service: Cardiovascular;  Laterality: N/A;  . BIV ICD GENERTAOR CHANGE OUT N/A 06/12/2014   Procedure: BIV ICD GENERTAOR CHANGE OUT;  Surgeon: Colton Sprang, MD;  Location: [name];  Service: Cardiovascular;  Laterality: N/A;  . CARDIAC CATHETERIZATION N/A 01/19/2016   Procedure: Left Heart Cath and Cors/Grafts Angiography;  Surgeon: Colton [name] M Martin[place], MD;  Location: [name];  Service: Cardiovascular;  Laterality: N/A;  . CORONARY ARTERY BYPASS GRAFT  2006   Emergent CABG x 3 with LIMA to LAD, SVG to OM, SVG to distal RCA per Dr. Servando Weaver  . FRACTURE SURGERY    . INGUINAL HERNIA REPAIR  02/09/2012   Procedure: HERNIA REPAIR INGUINAL ADULT;  Surgeon: Colton Ober, MD;  Location: [name];  Service: General;  Laterality: Right;  . INGUINAL HERNIA REPAIR Bilateral [id number]  . INSERTION OF MESH  02/09/2012   Procedure: INSERTION OF MESH;  Surgeon: Colton Ober, MD;  Location: [name];  Service: General;  Laterality: Right;  . LEFT HEART CATHETERIZATION WITH CORONARY/GRAFT ANGIOGRAM N/A 05/25/2011   Procedure: LEFT HEART CATHETERIZATION WITH Beatrix Fetters;  Surgeon: Colton [name] M Martinique, MD;  Location: [name];  Service: Cardiovascular;  Laterality: N/A;  . LIVER BIOPSY  1990s  . SHOULDER OPEN ROTATOR CUFF REPAIR Bilateral ?2000/~ 2007   left; right   . V TACH ABLATION N/A 06/08/2016   Procedure: V Tach Ablation;  Surgeon: Colton Grayer, MD;  Location: [name];  Service: Cardiovascular;  Laterality: N/A;  . VENTRICULAR ABLATION SURGERY  06/08/2016  . WRIST FRACTURE SURGERY Left 06/2005   "plates and screws and cadavaer bone"    Social History   Tobacco Use    Smoking Status Former Smoker  . Packs/day: 2.00  . Years: 30.00  . Pack years: 60.00  . Types: Cigarettes  . Quit date: 03/11/2005  . Years since quitting: 14.5  Smokeless Tobacco Never Used    Social History   Substance and Sexual Activity  Alcohol Use Yes  . Alcohol/week: 3.0 standard drinks  . Types: 3 Cans of beer per week    Family History  Adopted: Yes    Review of Systems: The review of systems is per the HPI.  All other systems were reviewed and are negative.  Physical Exam: BP 116/74   Pulse 74   Ht 6' (1.829 m)   Wt 214 lb 12.8 oz (97.4 kg)   SpO2 95%   BMI 29.13 kg/m  GENERAL:  Well  appearing WM in NAD HEENT:  PERRL, EOMI, sclera are clear. Oropharynx is clear. NECK:  No jugular venous distention, carotid upstroke brisk and symmetric, no bruits, no thyromegaly or adenopathy LUNGS:  Clear to auscultation bilaterally CHEST:  Unremarkable HEART:  RRR,  PMI not displaced or sustained,S1 and S2 within normal limits, no S3, no S4: no clicks, no rubs, no murmurs ABD:  Soft, nontender. BS +, no masses or bruits. No hepatomegaly, no splenomegaly EXT:  2 + pulses throughout, no edema, no cyanosis no clubbing SKIN:  Warm and dry.  No rashes NEURO:  Alert and oriented x 3. Cranial nerves II through XII intact. PSYCH:  Cognitively intact       Wt Readings from Last 3 Encounters:  09/19/19 214 lb 12.8 oz (97.4 kg)  06/03/19 217 lb (98.4 kg)  05/06/19 208 lb 6.4 oz (94.5 kg)   LABORATORY DATA:  PENDING  [facility] Results  Component Value Date   WBC 5.5 06/02/2016   HGB 12.0 (L) 06/02/2016   HCT 34.8 (L) 06/02/2016   PLT 213 06/02/2016   GLUCOSE 148 (H) 06/17/2019   CHOL 259 (H) 05/11/2016   TRIG 185 (H) 05/11/2016   HDL 39 (L) 05/11/2016   LDLDIRECT 206.0 04/14/2013   LDLCALC 183 (H) 05/11/2016   ALT 16 09/03/2018   AST 15 09/03/2018   NA 139 06/17/2019   K 4.9 06/17/2019   CL 101 06/17/2019   CREATININE 1.63 (H) 06/17/2019   BUN 24 06/17/2019   CO2  21 06/17/2019   TSH 2.970 09/03/2018   INR 1.05 01/19/2016   HGBA1C 6.0 10/06/2010   Labs dated 07/28/16: cholesterol 285, triglycerides 252, HDL 41, LDL 194.  Dated 12/13/16: A1c 6.1%.  Dated 09/18/18: cholesterol 229, triglycerides 200, HDL 35, LDL 154. LFTs normal. CBC normal Dated 01/16/19: normal TSH Dated 05/19/19. A1c 5.9% Dated 06/17/19: BUN 24, creatinine 1.63.   Ecg today shows AV pacing rate 74. I have personally reviewed and interpreted this study.   Echo 04/13/16: Study Conclusions  - Left ventricle: The cavity size was mildly dilated. Systolic   function was mildly to moderately reduced. The estimated ejection   fraction was in the range of 40% to 45%. Diffuse hypokinesis.   Akinesis of the apical myocardium. Doppler parameters are   consistent with abnormal left ventricular relaxation (grade 1   diastolic dysfunction). Doppler parameters are consistent with   indeterminate ventricular filling pressure. - Aortic valve: Transvalvular velocity was within the normal range.   There was no stenosis. There was no regurgitation. - Mitral valve: Transvalvular velocity was within the normal range.   There was no evidence for stenosis. There was trivial   regurgitation. - Right ventricle: The cavity size was normal. Wall thickness was   normal. - Tricuspid valve: There was trivial regurgitation. - Pulmonary arteries: Systolic pressure was within the normal   range. PA peak pressure: 25 mm Hg (S).  Cardiac cath 01/19/16: Procedures   Left Heart Cath and Cors/Grafts Angiography  Conclusion     Prox LAD to Mid LAD lesion, 85 %stenosed.  Ost Ramus to Ramus lesion, 55 %stenosed.  Prox Cx to Mid Cx lesion, 100 %stenosed.  Ost RCA to Dist RCA lesion, 100 %stenosed.  SVG graft was visualized by angiography and is small.  SVG graft was visualized by angiography and is small.  LIMA graft was visualized by angiography and is large and anatomically normal.  There is moderate  left ventricular systolic dysfunction.  LV end diastolic pressure  is moderately elevated.  The left ventricular ejection fraction is 35-45% by visual estimate.   1. Severe 3 vessel obstructive CAD 2. Large patent LIMA to the LAD 3. Patent SVG to OM2. This is a tiny graft supplying a very small OM  4. Patent SVG to PDA. This is also a tiny graft supplying a very tiny PDA 5. Moderate LV dysfunction. 6. Elevated LV EDP  Plan: medical therapy. Antiarrhythmic drug therapy per EP team.    Echo 05/19/19: IMPRESSIONS    1. Left ventricular ejection fraction, by estimation, is 25 to 30%. The  left ventricle has severely decreased function. There is severe akinesis  of the left ventricular, mid-apical anteroseptal wall.  2. Right ventricular systolic function is severely reduced. The right  ventricular size is mildly enlarged.  3. Mild to moderate mitral valve regurgitation.   Assessment / Plan: 1. Chronic systolic CHF status post CRT. EF reported 40-45% in 2018  but I thought at the time that this was overestimated. Now EF reported 25-30%.  Clinically  class  II. He was unable to tolerate Entresto due to significant hypotension. Thoracic impedence in March was normal. Now on lisinopril 5 mg bid, Coreg 25 mg bid, aldactone 25 mg daily and lasix 10 mg daily. Tolerating well.   2. Ischemic cardiomyopathy. See above.  3. Coronary disease status post CABG in 2006. Cardiac catheterization in October 2017 showed all grafts were patent. Clinically without angina  4. Right bundle branch block.   5. Hyperlipidemia. Patient is intolerant to statins. He is on fenofibrate and fish oil only. He states he is now willing to consider starting a PCSK 9 inhibitor. Knows several friends who are on it and doing well. Will update [facility] today.   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  Off AAD therapy. ICD check in December showed 3 episodes of VT that were pace terminated and some runs of NSVT. No  symptomatic episodes.   7. CKD last creatinine stable.   8. DM type 2. Based on renal function will need to reduce metformin to 500 mg bid. Would like to consider him for an SGLT2 inhibitor but this may also be limited by his renal function. Will need follow up with Dr Jacky Kindle to address options for treatment.   I will follow up in 6 months.

## 2019-09-19 ENCOUNTER — Encounter: Payer: Self-pay | Admitting: Cardiology

## 2019-09-19 ENCOUNTER — Ambulatory Visit: Payer: Medicare Other | Admitting: Cardiology

## 2019-09-19 ENCOUNTER — Other Ambulatory Visit: Payer: Self-pay

## 2019-09-19 ENCOUNTER — Telehealth: Payer: Self-pay | Admitting: *Deleted

## 2019-09-19 VITALS — BP 116/74 | HR 74 | Ht 72.0 in | Wt 214.8 lb

## 2019-09-19 DIAGNOSIS — I5022 Chronic systolic (congestive) heart failure: Secondary | ICD-10-CM | POA: Diagnosis not present

## 2019-09-19 DIAGNOSIS — I472 Ventricular tachycardia, unspecified: Secondary | ICD-10-CM

## 2019-09-19 DIAGNOSIS — I255 Ischemic cardiomyopathy: Secondary | ICD-10-CM

## 2019-09-19 DIAGNOSIS — I25810 Atherosclerosis of coronary artery bypass graft(s) without angina pectoris: Secondary | ICD-10-CM | POA: Diagnosis not present

## 2019-09-19 DIAGNOSIS — E782 Mixed hyperlipidemia: Secondary | ICD-10-CM

## 2019-09-19 DIAGNOSIS — Z789 Other specified health status: Secondary | ICD-10-CM

## 2019-09-19 LAB — COMPREHENSIVE METABOLIC PANEL
ALT: 15 IU/L (ref 0–44)
AST: 17 IU/L (ref 0–40)
Albumin/Globulin Ratio: 1.7 (ref 1.2–2.2)
Albumin: 4.2 g/dL (ref 3.8–4.8)
Alkaline Phosphatase: 44 IU/L — ABNORMAL LOW (ref 48–121)
BUN/Creatinine Ratio: 16 (ref 10–24)
BUN: 30 mg/dL — ABNORMAL HIGH (ref 8–27)
Bilirubin Total: 0.4 mg/dL (ref 0.0–1.2)
CO2: 19 mmol/L — ABNORMAL LOW (ref 20–29)
Calcium: 9.3 mg/dL (ref 8.6–10.2)
Chloride: 105 mmol/L (ref 96–106)
Creatinine, Ser: 1.83 mg/dL — ABNORMAL HIGH (ref 0.76–1.27)
GFR calc Af Amer: 44 mL/min/{1.73_m2} — ABNORMAL LOW (ref >59)
GFR calc non Af Amer: 38 mL/min/{1.73_m2} — ABNORMAL LOW (ref >59)
Globulin, Total: 2.5 g/dL (ref 1.5–4.5)
Glucose: 217 mg/dL — ABNORMAL HIGH (ref 65–99)
Potassium: 5.3 mmol/L — ABNORMAL HIGH (ref 3.5–5.2)
Sodium: 135 mmol/L (ref 134–144)
Total Protein: 6.7 g/dL (ref 6.0–8.5)

## 2019-09-19 LAB — LIPID PANEL
Chol/HDL Ratio: 5.7 ratio — ABNORMAL HIGH (ref 0.0–5.0)
Cholesterol, Total: 239 mg/dL — ABNORMAL HIGH (ref 100–199)
HDL: 42 mg/dL (ref >39)
LDL Chol Calc (NIH): 143 mg/dL — ABNORMAL HIGH (ref 0–99)
Triglycerides: 297 mg/dL — ABNORMAL HIGH (ref 0–149)
VLDL Cholesterol Cal: 54 mg/dL — ABNORMAL HIGH (ref 5–40)

## 2019-09-19 NOTE — Telephone Encounter (Signed)
Advised patient, verbalized understanding  Will reach out to [place] for assistance with Repatha

## 2019-09-19 NOTE — Patient Instructions (Signed)
Medication Instructions:  Your physician recommends that you continue on your current medications as directed. Please refer to the Current Medication list given to you today.  *If you need a refill on your cardiac medications before your next appointment, please call your pharmacy*   Lab Work: LP/CMET TODAY  If you have labs (blood work) drawn today and your tests are completely normal, you will receive your results only by:  MyChart Message (if you have MyChart) OR  A paper copy in the mail If you have any lab test that is abnormal or we need to change your treatment, we will call you to review the results.   Testing/Procedures: NONE    Follow-Up: At [facility], you and your health needs are our priority.  As part of our continuing mission to provide you with exceptional heart care, we have created designated Provider Care Teams.  These Care Teams include your primary Cardiologist (physician) and Advanced Practice Providers (APPs -  Physician Assistants and Nurse Practitioners) who all work together to provide you with the care you need, when you need it.  We recommend signing up for the patient portal called "MyChart".  Sign up information is provided on this After Visit Summary.  MyChart is used to connect with patients for Virtual Visits (Telemedicine).  Patients are able to view lab/test results, encounter notes, upcoming appointments, etc.  Non-urgent messages can be sent to your provider as well.   To learn more about what you can do with MyChart, go to [email or web address].    Your next appointment:   6 month(s)  The format for your next appointment:   In Person  Provider:   You may see DR [place] or one of the following Advanced Practice Providers on your designated Care Team:    Azalee Course, PA-C  Micah Flesher, PA-C or   Judy Pimple, [place]

## 2019-09-19 NOTE — Telephone Encounter (Signed)
-----   Message from Peter M [place], MD sent at 09/19/2019  4:51 PM EDT ----- The following abnormalities are noted:  lipids look bad. High cholesterol and triglycerides. Sugar is high. Creatinine is worse and potassium level is a little high. All other values are normal, stable or within acceptable limits. Medication changes / Follow up labs / Other changes or recommendations:   I would reduce aldactone to 12.5 mg daily. I would like to get him on Repatha. He saw Dr Rennis Golden a year ago and was approved for Repatha but never started. We will probably need to get approval again but if not can go ahead and send in Rx.    Peter [place], MD 09/19/2019 4:48 PM

## 2019-09-22 MED ORDER — REPATHA SURECLICK 140 MG/ML ~~LOC~~ SOAJ: 1.0000 | SUBCUTANEOUS | 11 refills | Status: DC

## 2019-09-22 NOTE — Telephone Encounter (Signed)
Attempted PA for Repatha via CMM This medication or product was previously approved on [id number] from 2019-04-11 to 2020-04-09. **Please note: Formulary lowering, tiering exception, cost reduction and/or pre-benefit determination review (including prospective Medicare hospice reviews) requests cannot be requested using this method of submission. Please contact us at [phone] instead.  Rx sent to [facility]

## 2019-10-02 ENCOUNTER — Ambulatory Visit (INDEPENDENT_AMBULATORY_CARE_PROVIDER_SITE_OTHER): Payer: Medicare Other | Admitting: *Deleted

## 2019-10-02 DIAGNOSIS — I255 Ischemic cardiomyopathy: Secondary | ICD-10-CM | POA: Diagnosis not present

## 2019-10-02 LAB — CUP PACEART REMOTE DEVICE CHECK
Battery Remaining Longevity: 39 mo
Battery Voltage: 2.96 V
Brady Statistic AP VP Percent: 23.79 %
Brady Statistic AP VS Percent: 0.01 %
Brady Statistic AS VP Percent: 75.82 %
Brady Statistic AS VS Percent: 0.37 %
Brady Statistic RA Percent Paced: 23.27 %
Brady Statistic RV Percent Paced: 96.64 %
Date Time Interrogation Session: 20210624033524
HighPow Impedance: 67 Ohm
Implantable Lead Implant Date: 20130513
Implantable Lead Implant Date: 20130513
Implantable Lead Implant Date: 20130513
Implantable Lead Location: 753858
Implantable Lead Location: 753859
Implantable Lead Location: 753860
Implantable Lead Model: 181
Implantable Lead Model: 4396
Implantable Lead Model: 5076
Implantable Lead Serial Number: 316784
Implantable Pulse Generator Implant Date: 20160304
Lead Channel Impedance Value: 399 Ohm
Lead Channel Impedance Value: 437 Ohm
Lead Channel Impedance Value: 513 Ohm
Lead Channel Impedance Value: 532 Ohm
Lead Channel Impedance Value: 532 Ohm
Lead Channel Impedance Value: 779 Ohm
Lead Channel Pacing Threshold Amplitude: 0.875 V
Lead Channel Pacing Threshold Amplitude: 0.875 V
Lead Channel Pacing Threshold Pulse Width: 0.4 ms
Lead Channel Pacing Threshold Pulse Width: 0.4 ms
Lead Channel Sensing Intrinsic Amplitude: 1.375 mV
Lead Channel Sensing Intrinsic Amplitude: 1.375 mV
Lead Channel Sensing Intrinsic Amplitude: 9.375 mV
Lead Channel Sensing Intrinsic Amplitude: 9.375 mV
Lead Channel Setting Pacing Amplitude: 2 V
Lead Channel Setting Pacing Amplitude: 2.5 V
Lead Channel Setting Pacing Pulse Width: 0.4 ms
Lead Channel Setting Sensing Sensitivity: 0.45 mV

## 2019-10-03 NOTE — Progress Notes (Signed)
Remote ICD transmission.   

## 2019-11-17 ENCOUNTER — Other Ambulatory Visit: Payer: Self-pay | Admitting: Cardiology

## 2020-01-01 ENCOUNTER — Ambulatory Visit (INDEPENDENT_AMBULATORY_CARE_PROVIDER_SITE_OTHER): Payer: Medicare Other | Admitting: Emergency Medicine

## 2020-01-01 DIAGNOSIS — I255 Ischemic cardiomyopathy: Secondary | ICD-10-CM | POA: Diagnosis not present

## 2020-01-01 LAB — CUP PACEART REMOTE DEVICE CHECK
Battery Remaining Longevity: 34 mo
Battery Voltage: 2.95 V
Brady Statistic AP VP Percent: 21.44 %
Brady Statistic AP VS Percent: 0.01 %
Brady Statistic AS VP Percent: 78.35 %
Brady Statistic AS VS Percent: 0.2 %
Brady Statistic RA Percent Paced: 21.06 %
Brady Statistic RV Percent Paced: 97.38 %
Date Time Interrogation Session: 20210923033424
HighPow Impedance: 64 Ohm
Implantable Lead Implant Date: 20130513
Implantable Lead Implant Date: 20130513
Implantable Lead Implant Date: 20130513
Implantable Lead Location: 753858
Implantable Lead Location: 753859
Implantable Lead Location: 753860
Implantable Lead Model: 181
Implantable Lead Model: 4396
Implantable Lead Model: 5076
Implantable Lead Serial Number: 316784
Implantable Pulse Generator Implant Date: 20160304
Lead Channel Impedance Value: 399 Ohm
Lead Channel Impedance Value: 456 Ohm
Lead Channel Impedance Value: 456 Ohm
Lead Channel Impedance Value: 494 Ohm
Lead Channel Impedance Value: 532 Ohm
Lead Channel Impedance Value: 836 Ohm
Lead Channel Pacing Threshold Amplitude: 0.75 V
Lead Channel Pacing Threshold Amplitude: 1 V
Lead Channel Pacing Threshold Pulse Width: 0.4 ms
Lead Channel Pacing Threshold Pulse Width: 0.4 ms
Lead Channel Sensing Intrinsic Amplitude: 1.5 mV
Lead Channel Sensing Intrinsic Amplitude: 1.5 mV
Lead Channel Sensing Intrinsic Amplitude: 11.375 mV
Lead Channel Sensing Intrinsic Amplitude: 11.375 mV
Lead Channel Setting Pacing Amplitude: 2 V
Lead Channel Setting Pacing Amplitude: 2.5 V
Lead Channel Setting Pacing Pulse Width: 0.4 ms
Lead Channel Setting Sensing Sensitivity: 0.45 mV

## 2020-01-06 NOTE — Progress Notes (Signed)
Remote ICD transmission.   

## 2020-02-19 ENCOUNTER — Telehealth: Payer: Self-pay | Admitting: Cardiology

## 2020-02-19 NOTE — Telephone Encounter (Signed)
° ° °  April RN with [employer]. She is following up the fax request for pt's statin therapy. She said they faxed it last 11/05

## 2020-02-19 NOTE — Telephone Encounter (Signed)
I have no paperwork on this patient  

## 2020-02-24 NOTE — Telephone Encounter (Signed)
Spoke to [facility] advised patient is taking Repatha 140 mg injection every 14 days.

## 2020-03-08 NOTE — Progress Notes (Signed)
[name] Date of Birth: May 01, 1955 Medical Record #[id number]  History of Present Illness: [name] is seen for follow up CAD and CHF.  He has an ischemic CM and has CRT-D in place for primary prevention since April of 2013. EF by Echo was 45-50% at that time. Cardiac MRI showed an EF of 31%. Other issues include HLD (statin intolerant and intolerant to zetia), hemochromatosis, RBBB, PAF, type 2 DM and OSA. He is s/p CABG in 2006 following an MI. Cardiac cath in February 2013 showed patrent grafts. After CRT therapy he did have a significant improvement in dyspnea.   He was admitted in October 2017 with VT and received shocks. Cardiac cath demonstrated patent grafts. He was started on sotalol. In January 2018 he presented with syncope and recurrent VT. Switched to amiodarone. Was concerned about taking amiodarone long term. Underwent VT ablation by Dr. Johney Frame on June 08, 2016. Amiodarone later discontinued.  Last device check in Decmber showed 3 episodes of VT that were pace terminated. Approx. 15 runs of NSVT.  Last Echo in January 2018 showed EF 40-45%.  He is intolerant of niacin, statins (Simcor and Vytorin) and Zetia. Was seen by Dr Rennis Golden a year ago and approved for Repatha. Patient decided not to start at that time.    On a prior visit we attempted to initiate him on Entresto. Lisinopril was washed out. Lasix changed to prn. Even at lower dose he was unable to tolerate Entresto due to hypotension with pressures in the 80s systolic and symptoms of lightheadedness and dizziness. Lisinopril resumed.   He had a televisit in January 2021 with Dr Graciela Husbands. Asymptomatic. Echo ordered showing a lower EF than before. Last device check 07/03/19 showed 9 episodes of NSVT. No device therapies. Last device check in September showed some NSVT but no therapies..   On follow up today he is doing well. No symptomatic VT.  Notes he is out of breath always but this hasn't changed. Weight is stable. No increase  in edema. No chest pain.     Current Outpatient Medications  Medication Sig Dispense Refill  . aspirin 81 MG tablet Take 81 mg by mouth daily.    . carvedilol (COREG) 25 MG tablet TAKE 1 TABLET BY MOUTH 2 TIMES DAILY WITH A MEAL. 180 tablet 3  . Evolocumab (REPATHA SURECLICK) 140 MG/ML SOAJ Inject 1 Dose into the skin every 14 (fourteen) days. 2 pen 11  . fenofibrate 160 MG tablet TAKE 1 TABLET BY MOUTH DAILY. 90 tablet 3  . furosemide (LASIX) 20 MG tablet TAKE 1/2 TABLET BY MOUTH DAILY. 45 tablet 1  . lisinopril (ZESTRIL) 5 MG tablet Take 1 tablet (5 mg total) by mouth in the morning and at bedtime. 180 tablet 3  . magnesium oxide (MAG-OX) 400 MG tablet Take 1 tablet by mouth 2 (two) times daily.    . metFORMIN (GLUCOPHAGE) 500 MG tablet Take 500 mg by mouth 2 (two) times daily with a meal.    . NITROSTAT 0.4 MG SL tablet PLACE 1 TABLET UNDER THE TONGUE EVERY 5 MINUTES AS NEEDED FOR CHEST PAIN FOR UP TO 3 DOSES. 25 tablet 3  . ONETOUCH VERIO test strip 1 each by Other route as needed for other.   6  . spironolactone (ALDACTONE) 25 MG tablet Take 25 mg by mouth as directed. Take 1/2 tablet daily     No current facility-administered medications for this visit.    Allergies  Allergen Reactions  . Entresto [Sacubitril-Valsartan]  Other (See Comments)    Hypotension, increased heart rate, dizziness, blurry vision  . Statins Other (See Comments)    Myalgias     Past Medical History:  Diagnosis Date  . AICD (automatic cardioverter/defibrillator) present   . Anemia   . Arthritis    "mild in my joints & knuckles" (05/10/2016)  . CHF (congestive heart failure) (HCC)   . Coronary artery disease   . Hemochromatosis   . Hyperlipidemia   . Ischemic cardiomyopathy    EF 36%; prior anterior MI, s/p CABG x 3; s/p cath Feb 2013 showing grafts to be patent with severe LV dysfunction  . Myocardial infarction (HCC) 2006  . PAF (paroxysmal atrial fibrillation) (HCC)   . Pneumonia 2000  . RBBB  (right bundle branch block)   . Sleep apnea    "can't tolerate mask" (05/10/2016)  . Type II diabetes mellitus (HCC)   . Ventricular tachycardia, 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    Past Surgical History:  Procedure Laterality Date  . BI-VENTRICULAR IMPLANTABLE CARDIOVERTER DEFIBRILLATOR N/A 08/21/2011   Procedure: BI-VENTRICULAR IMPLANTABLE CARDIOVERTER DEFIBRILLATOR  (CRT-D);  Surgeon: Duke SalviaSteven C Klein, MD;  [facility];  Service: Cardiovascular;  Laterality: N/A;  . BIV ICD GENERTAOR CHANGE OUT N/A 06/12/2014   Procedure: BIV ICD GENERTAOR CHANGE OUT;  Surgeon: Duke SalviaSteven C Klein, MD;  [facility];  Service: Cardiovascular;  Laterality: N/A;  . CARDIAC CATHETERIZATION N/A 01/19/2016   Procedure: Left Heart Cath and Cors/Grafts Angiography;  Surgeon: Rowen Wilmer M SwazilandJordan, MD;  [facility];  Service: Cardiovascular;  Laterality: N/A;  . CORONARY ARTERY BYPASS GRAFT  2006   Emergent CABG x 3 with LIMA to LAD, SVG to OM, SVG to distal RCA per Dr. Tyrone SageGerhardt  . FRACTURE SURGERY    . INGUINAL HERNIA REPAIR  02/09/2012   Procedure: HERNIA REPAIR INGUINAL ADULT;  Surgeon: Cherylynn RidgesJames O Wyatt, MD;  [facility];  Service: General;  Laterality: Right;  . INGUINAL HERNIA REPAIR Bilateral [id number]  . INSERTION OF MESH  02/09/2012   Procedure: INSERTION OF MESH;  Surgeon: Cherylynn RidgesJames O Wyatt, MD;  [facility];  Service: General;  Laterality: Right;  . LEFT HEART CATHETERIZATION WITH CORONARY/GRAFT ANGIOGRAM N/A 05/25/2011   Procedure: LEFT HEART CATHETERIZATION WITH Isabel CapriceORONARY/GRAFT ANGIOGRAM;  Surgeon: Baylyn Sickles M SwazilandJordan, MD;  [facility];  Service: Cardiovascular;  Laterality: N/A;  . LIVER BIOPSY  1990s  . SHOULDER OPEN ROTATOR CUFF REPAIR Bilateral ?2000/~ 2007   left; right   . V TACH ABLATION N/A 06/08/2016   Procedure: V Tach Ablation;  Surgeon: Hillis RangeJames Allred, MD;  Location: [facility] INVASIVE CV LAB;  Service: Cardiovascular;  Laterality: N/A;  . VENTRICULAR ABLATION SURGERY  06/08/2016    . WRIST FRACTURE SURGERY Left 06/2005   "plates and screws and cadavaer bone"    Social History   Tobacco Use  Smoking Status Former Smoker  . Packs/day: 2.00  . Years: 30.00  . Pack years: 60.00  . Types: Cigarettes  . Quit date: 03/11/2005  . Years since quitting: 15.0  Smokeless Tobacco Never Used    Social History   Substance and Sexual Activity  Alcohol Use Yes  . Alcohol/week: 3.0 standard drinks  . Types: 3 Cans of beer per week    Family History  Adopted: Yes    Review of Systems: The review of systems is per the HPI.  All other systems were reviewed and are negative.  Physical Exam: BP 115/63   Pulse 74  Temp (!) 96.6 F (35.9 C)   Ht 6' (1.829 m)   Wt 215 lb (97.5 kg)   SpO2 97%   BMI 29.16 kg/m  GENERAL:  Well appearing WM in NAD HEENT:  PERRL, EOMI, sclera are clear. Oropharynx is clear. NECK:  No jugular venous distention, carotid upstroke brisk and symmetric, no bruits, no thyromegaly or adenopathy LUNGS:  Clear to auscultation bilaterally CHEST:  Unremarkable HEART:  RRR,  PMI not displaced or sustained,S1 and S2 within normal limits, no S3, no S4: no clicks, no rubs, no murmurs ABD:  Soft, nontender. BS +, no masses or bruits. No hepatomegaly, no splenomegaly EXT:  2 + pulses throughout, no edema, no cyanosis no clubbing SKIN:  Warm and dry.  No rashes NEURO:  Alert and oriented x 3. Cranial nerves II through XII intact. PSYCH:  Cognitively intact       Wt Readings from Last 3 Encounters:  03/12/20 215 lb (97.5 kg)  09/19/19 214 lb 12.8 oz (97.4 kg)  06/03/19 217 lb (98.4 kg)   LABORATORY DATA:  PENDING  Lab Results  Component Value Date   WBC 5.5 06/02/2016   HGB 12.0 (L) 06/02/2016   HCT 34.8 (L) 06/02/2016   PLT 213 06/02/2016   GLUCOSE 217 (H) 09/19/2019   CHOL 239 (H) 09/19/2019   TRIG 297 (H) 09/19/2019   HDL 42 09/19/2019   LDLDIRECT 206.0 04/14/2013   LDLCALC 143 (H) 09/19/2019   ALT 15 09/19/2019   AST 17  09/19/2019   NA 135 09/19/2019   K 5.3 (H) 09/19/2019   CL 105 09/19/2019   CREATININE 1.83 (H) 09/19/2019   BUN 30 (H) 09/19/2019   CO2 19 (L) 09/19/2019   TSH 2.970 09/03/2018   INR 1.05 01/19/2016   HGBA1C 6.0 10/06/2010   Labs dated 07/28/16: cholesterol 285, triglycerides 252, HDL 41, LDL 194.  Dated 12/13/16: A1c 6.1%.  Dated 09/18/18: cholesterol 229, triglycerides 200, HDL 35, LDL 154. LFTs normal. CBC normal Dated 01/16/19: normal TSH Dated 05/19/19. A1c 5.9% Dated 06/17/19: BUN 24, creatinine 1.63.  Dated 11/19/19: A1c 6.9%. cholesterol 124, triglycerides 206, HDL 44, LDL 39. Creatinine 1.8. BUN 21. Otherwise CMET, CBC and TSH normal.  Echo 04/13/16: Study Conclusions  - Left ventricle: The cavity size was mildly dilated. Systolic   function was mildly to moderately reduced. The estimated ejection   fraction was in the range of 40% to 45%. Diffuse hypokinesis.   Akinesis of the apical myocardium. Doppler parameters are   consistent with abnormal left ventricular relaxation (grade 1   diastolic dysfunction). Doppler parameters are consistent with   indeterminate ventricular filling pressure. - Aortic valve: Transvalvular velocity was within the normal range.   There was no stenosis. There was no regurgitation. - Mitral valve: Transvalvular velocity was within the normal range.   There was no evidence for stenosis. There was trivial   regurgitation. - Right ventricle: The cavity size was normal. Wall thickness was   normal. - Tricuspid valve: There was trivial regurgitation. - Pulmonary arteries: Systolic pressure was within the normal   range. PA peak pressure: 25 mm Hg (S).  Cardiac cath 01/19/16: Procedures   Left Heart Cath and Cors/Grafts Angiography  Conclusion     Prox LAD to Mid LAD lesion, 85 %stenosed.  Ost Ramus to Ramus lesion, 55 %stenosed.  Prox Cx to Mid Cx lesion, 100 %stenosed.  Ost RCA to Dist RCA lesion, 100 %stenosed.  SVG graft was visualized  by angiography and is  small.  SVG graft was visualized by angiography and is small.  LIMA graft was visualized by angiography and is large and anatomically normal.  There is moderate left ventricular systolic dysfunction.  LV end diastolic pressure is moderately elevated.  The left ventricular ejection fraction is 35-45% by visual estimate.   1. Severe 3 vessel obstructive CAD 2. Large patent LIMA to the LAD 3. Patent SVG to OM2. This is a tiny graft supplying a very small OM  4. Patent SVG to PDA. This is also a tiny graft supplying a very tiny PDA 5. Moderate LV dysfunction. 6. Elevated LV EDP  Plan: medical therapy. Antiarrhythmic drug therapy per EP team.    Echo 05/19/19: IMPRESSIONS    1. Left ventricular ejection fraction, by estimation, is 25 to 30%. The  left ventricle has severely decreased function. There is severe akinesis  of the left ventricular, mid-apical anteroseptal wall.  2. Right ventricular systolic function is severely reduced. The right  ventricular size is mildly enlarged.  3. Mild to moderate mitral valve regurgitation.   Assessment / Plan: 1. Chronic systolic CHF status post CRT. EF reported 40-45% in 2018  but I thought at the time that this was overestimated. Now EF reported 25-30%.  Clinically  class  II-III. He was unable to tolerate Entresto due to significant hypotension. Now on lisinopril 5 mg bid, Coreg 25 mg bid, aldactone 25 mg daily and lasix 10 mg daily. Tolerating well. Will increase lisinopril to 10 mg in the am and 5 mg in the pm.  2. Ischemic cardiomyopathy. See above.  3. Coronary disease status post CABG in 2006. Cardiac catheterization in October 2017 showed all grafts were patent. Clinically without angina  4. Right bundle branch block.   5. Hyperlipidemia. Patient is intolerant to statins. He was on fenofibrate and fish oil only. With addition of Repatha excellent response with LDL down to 39.   6. VT storm. S/p VT ablation  June 08, 2016.  Off AAD therapy. ICD check shows some runs of NSVT. No symptomatic episodes.   7. CKD last creatinine stable.   8. DM type 2. Would consider addition of an SGLT 2 inhibitor given CHF and DM. Seeing Dr Jacky Kindle on Dec 22 so will defer to him.   I will follow up in 6 months.

## 2020-03-12 ENCOUNTER — Ambulatory Visit: Payer: Medicare Other | Admitting: Cardiology

## 2020-03-12 ENCOUNTER — Other Ambulatory Visit: Payer: Self-pay

## 2020-03-12 ENCOUNTER — Encounter: Payer: Self-pay | Admitting: Cardiology

## 2020-03-12 VITALS — BP 115/63 | HR 74 | Temp 96.6°F | Ht 72.0 in | Wt 215.0 lb

## 2020-03-12 DIAGNOSIS — I5022 Chronic systolic (congestive) heart failure: Secondary | ICD-10-CM | POA: Diagnosis not present

## 2020-03-12 DIAGNOSIS — I472 Ventricular tachycardia, unspecified: Secondary | ICD-10-CM

## 2020-03-12 DIAGNOSIS — Z95810 Presence of automatic (implantable) cardiac defibrillator: Secondary | ICD-10-CM

## 2020-03-12 DIAGNOSIS — E782 Mixed hyperlipidemia: Secondary | ICD-10-CM

## 2020-03-12 DIAGNOSIS — I255 Ischemic cardiomyopathy: Secondary | ICD-10-CM

## 2020-03-12 DIAGNOSIS — I25810 Atherosclerosis of coronary artery bypass graft(s) without angina pectoris: Secondary | ICD-10-CM

## 2020-03-12 MED ORDER — LISINOPRIL 5 MG PO TABS: ORAL_TABLET | ORAL | 3 refills | Status: DC

## 2020-03-12 NOTE — Patient Instructions (Signed)
Increase lisinopril to 10 mg in the morning and 5 mg in the evening  Follow up in 6 months.

## 2020-04-01 ENCOUNTER — Ambulatory Visit (INDEPENDENT_AMBULATORY_CARE_PROVIDER_SITE_OTHER): Payer: Medicare Other

## 2020-04-01 DIAGNOSIS — I472 Ventricular tachycardia, unspecified: Secondary | ICD-10-CM

## 2020-04-01 LAB — CUP PACEART REMOTE DEVICE CHECK
Battery Remaining Longevity: 32 mo
Battery Voltage: 2.95 V
Brady Statistic AP VP Percent: 20.7 %
Brady Statistic AP VS Percent: 0.02 %
Brady Statistic AS VP Percent: 79.05 %
Brady Statistic AS VS Percent: 0.23 %
Brady Statistic RA Percent Paced: 20.23 %
Brady Statistic RV Percent Paced: 96 %
Date Time Interrogation Session: 20211223033623
HighPow Impedance: 59 Ohm
Implantable Lead Implant Date: 20130513
Implantable Lead Implant Date: 20130513
Implantable Lead Implant Date: 20130513
Implantable Lead Location: 753858
Implantable Lead Location: 753859
Implantable Lead Location: 753860
Implantable Lead Model: 181
Implantable Lead Model: 4396
Implantable Lead Model: 5076
Implantable Lead Serial Number: 316784
Implantable Pulse Generator Implant Date: 20160304
Lead Channel Impedance Value: 380 Ohm
Lead Channel Impedance Value: 437 Ohm
Lead Channel Impedance Value: 456 Ohm
Lead Channel Impedance Value: 456 Ohm
Lead Channel Impedance Value: 513 Ohm
Lead Channel Impedance Value: 760 Ohm
Lead Channel Pacing Threshold Amplitude: 0.75 V
Lead Channel Pacing Threshold Amplitude: 1 V
Lead Channel Pacing Threshold Pulse Width: 0.4 ms
Lead Channel Pacing Threshold Pulse Width: 0.4 ms
Lead Channel Sensing Intrinsic Amplitude: 1.5 mV
Lead Channel Sensing Intrinsic Amplitude: 1.5 mV
Lead Channel Sensing Intrinsic Amplitude: 8.75 mV
Lead Channel Sensing Intrinsic Amplitude: 8.75 mV
Lead Channel Setting Pacing Amplitude: 2 V
Lead Channel Setting Pacing Amplitude: 2.5 V
Lead Channel Setting Pacing Pulse Width: 0.4 ms
Lead Channel Setting Sensing Sensitivity: 0.45 mV

## 2020-04-15 NOTE — Progress Notes (Signed)
Remote ICD transmission.   

## 2020-04-19 ENCOUNTER — Other Ambulatory Visit: Payer: Self-pay | Admitting: Internal Medicine

## 2020-05-17 ENCOUNTER — Other Ambulatory Visit: Payer: Self-pay | Admitting: Internal Medicine

## 2020-05-17 ENCOUNTER — Other Ambulatory Visit: Payer: Self-pay | Admitting: Cardiology

## 2020-06-17 NOTE — Progress Notes (Unsigned)
Cardiology Office Note Date:  06/18/2020  Patient ID:  [name], [name] Jan 13, 1956, MRN [id number] PCP:  Geoffry Paradise, MD  Cardiologist:  Dr. Swaziland Electrophysiologist: Dr. Graciela Husbands   Chief Complaint:  annual device visit  History of Present Illness: [name] is a 65 y.o. male with history of  CAD s/p CABG x 3, type II DM, HLD, chronic systolic HF and ischemic cardiomyoapthy with EF 35-40% s/p Medtronic CRT-D. He also has hemochromatosis, right bundle branch block, PAF and OSA, VT  He comes in today to be seen for Dr. Graciela Husbands, last seen by him via telehealth Jan 2021, noted NSVT episodes of 4 different morphologies.  No changes were made, planned for 29mo visit.  Most recently he saw Dr. Swaziland Dec 2021, noted unable to tolerate entresto 2/2 hypotension, maintained on ACE.  He was doing well, his ACE was increased.  Discussed adding SGLT2, though decision deferred to PMD.  TODAY He is doing well. His wife recently suffered a terrible ankle fracture and their usual routines upended for the last few months. He denies any CP, palpitations or cardiac awareness. No dizzy spells, near syncope ro syncope. No rest SOB, no DOE with his usual ADLs will get winded with heavier activities, this is his baceline and unchanged for years.  He sees his  PMD 3-4x/year, labs annually if not more often  DEVICE information: MDT CRT-D, implanted 08/21/11 primary prevention,  LV chronically programmed off   Arrhythmia hx October 2017 + appropriate shocks for VT storm  Jan 2018 VT storm, + appropriate tx Ablation march 2018 March 2020 MMVT successful ATP during charge  AAD  Sotalol Oct 2017 > stopped Jan 2018 with recurrent VT  Jan 2018 amiodarone, stopped +/- July 2018 ? Pt preference Restarted April 2020 stopped July 2020 2/2 diarrhea/tremor  Past Medical History:  Diagnosis Date  . AICD (automatic cardioverter/defibrillator) present   . Anemia   . Arthritis    "mild in my joints  & knuckles" (05/10/2016)  . CHF (congestive heart failure) (HCC)   . Coronary artery disease   . Hemochromatosis   . Hyperlipidemia   . Ischemic cardiomyopathy    EF 36%; prior anterior MI, s/p CABG x 3; s/p cath Feb 2013 showing grafts to be patent with severe LV dysfunction  . Myocardial infarction (HCC) 2006  . PAF (paroxysmal atrial fibrillation) (HCC)   . Pneumonia 2000  . RBBB (right bundle branch block)   . Sleep apnea    "can't tolerate mask" (05/10/2016)  . Type II diabetes mellitus (HCC)   . Ventricular tachycardia, 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    Past Surgical History:  Procedure Laterality Date  . BI-VENTRICULAR IMPLANTABLE CARDIOVERTER DEFIBRILLATOR N/A 08/21/2011   Procedure: BI-VENTRICULAR IMPLANTABLE CARDIOVERTER DEFIBRILLATOR  (CRT-D);  Surgeon: Duke Salvia, MD;  Location: [facility] CATH LAB;  Service: Cardiovascular;  Laterality: N/A;  . BIV ICD GENERTAOR CHANGE OUT N/A 06/12/2014   Procedure: BIV ICD GENERTAOR CHANGE OUT;  Surgeon: Duke Salvia, MD;  Location: [facility] CATH LAB;  Service: Cardiovascular;  Laterality: N/A;  . CARDIAC CATHETERIZATION N/A 01/19/2016   Procedure: Left Heart Cath and Cors/Grafts Angiography;  Surgeon: Peter M Swaziland, MD;  Location: [facility] INVASIVE CV LAB;  Service: Cardiovascular;  Laterality: N/A;  . CORONARY ARTERY BYPASS GRAFT  2006   Emergent CABG x 3 with LIMA to LAD, SVG to OM, SVG to distal RCA per Dr. Tyrone Sage  . FRACTURE SURGERY    . INGUINAL HERNIA REPAIR  02/09/2012  Procedure: HERNIA REPAIR INGUINAL ADULT;  Surgeon: Cherylynn Ridges, MD;  Location: [facility] OR;  Service: General;  Laterality: Right;  . INGUINAL HERNIA REPAIR Bilateral [id number]  . INSERTION OF MESH  02/09/2012   Procedure: INSERTION OF MESH;  Surgeon: Cherylynn Ridges, MD;  Location: [facility] OR;  Service: General;  Laterality: Right;  . LEFT HEART CATHETERIZATION WITH CORONARY/GRAFT ANGIOGRAM N/A 05/25/2011   Procedure: LEFT HEART CATHETERIZATION WITH Isabel Caprice;  Surgeon: Peter  M Swaziland, MD;  Location[facility] CATH LAB;  Service: Cardiovascular;  Laterality: N/A;  . LIVER BIOPSY  1990s  . SHOULDER OPEN ROTATOR CUFF REPAIR Bilateral ?2000/~ 2007   left; right   . V TACH ABLATION N/A 06/08/2016   Procedure: V Tach Ablation;  Surgeon: Hillis Range, MD;  Location: [facility] INVASIVE CV LAB;  Service: Cardiovascular;  Laterality: N/A;  . VENTRICULAR ABLATION SURGERY  06/08/2016  . WRIST FRACTURE SURGERY Left 06/2005   "plates and screws and cadavaer bone"    Current Outpatient Medications  Medication Sig Dispense Refill  . aspirin 81 MG tablet Take 81 mg by mouth daily.    . Evolocumab (REPATHA SURECLICK) 140 MG/ML SOAJ Inject 1 Dose into the skin every 14 (fourteen) days. 2 pen 11  . fenofibrate 160 MG tablet TAKE 1 TABLET BY MOUTH DAILY. 90 tablet 3  . furosemide (LASIX) 20 MG tablet TAKE 1/2 TABLET BY MOUTH DAILY. 45 tablet 10  . lisinopril (ZESTRIL) 5 MG tablet Take 10 mg in the morning and 5 mg in the evening 270 tablet 3  . magnesium oxide (MAG-OX) 400 MG tablet Take 1 tablet (400 mg total) by mouth 2 (two) times daily. Please make overdue appt with Dr. Graciela Husbands before anymore refills. Thank you 1st attempt 60 tablet 0  . metFORMIN (GLUCOPHAGE) 500 MG tablet Take 500 mg by mouth 2 (two) times daily with a meal.    . NITROSTAT 0.4 MG SL tablet PLACE 1 TABLET UNDER THE TONGUE EVERY 5 MINUTES AS NEEDED FOR CHEST PAIN FOR UP TO 3 DOSES. 25 tablet 3  . ONETOUCH VERIO test strip 1 each by Other route as needed for other.   6  . spironolactone (ALDACTONE) 25 MG tablet Take 25 mg by mouth as directed. Take 1/2 tablet daily    . carvedilol (COREG) 25 MG tablet Take 1 tablet (25 mg total) by mouth 2 (two) times daily with a meal. 180 tablet 3   No current facility-administered medications for this visit.    Allergies:   Entresto [sacubitril-valsartan] and Statins   Social History:  The patient  reports that he quit smoking about 15 years ago. His smoking use included cigarettes. He has  a 60.00 pack-year smoking history. He has never used smokeless tobacco. He reports current alcohol use of about 3.0 standard drinks of alcohol per week. He reports that he does not use drugs.   Family History:  The patient's family history is not on file. He was adopted.   ROS:  Please see the history of present illness.  All other systems are reviewed and otherwise negative.   PHYSICAL EXAM:  VS:  BP 126/64   Pulse 82   Ht 6' (1.829 m)   Wt 215 lb (97.5 kg)   SpO2 96%   BMI 29.16 kg/m  BMI: Body mass index is 29.16 kg/m. Well nourished, well developed, in no acute distress  HEENT: normocephalic, atraumatic  Neck: no JVD, carotid bruits or masses Cardiac: RRR, no significant murmurs, no rubs, or  gallops Lungs:  CTA b/l, no wheezing, rhonchi or rales  Abd: soft, nontender MS: no deformity or atrophy Ext:  no edema  Skin: warm and dry, no rash Neuro:  No gross deficits appreciated Psych: euthymic mood, full affect  ICD site is stable, no tethering or discomfort   EKG:  Not done today  ICD interrogation today and reviewed by myself:  Battery and lead measurements are good NSVT episodes only, I appreciate 2 different morphologies No AF No programming changes made  05/19/2019: TTE IMPRESSIONS  1. Left ventricular ejection fraction, by estimation, is 25 to 30%. The  left ventricle has severely decreased function. There is severe akinesis  of the left ventricular, mid-apical anteroseptal wall.  2. Right ventricular systolic function is severely reduced. The right  ventricular size is mildly enlarged.  3. Mild to moderate mitral valve regurgitation.   06/08/2016: EPS/Ablation  CONCLUSIONS: 1. Sinus rhythm upon presentation   2. Inducible ventricular tachycardia with a cycle length of 360 milliseconds, which was felt to be the clinical tachycardia. This was a right bundle-branch superior axis VT with a Q-wave in V6. This tachycardia was mapped (pace mapping) and  successfully ablated along the inferoseptal portion of the left ventricle. 3. A large inferior LV scar wasdemonstrated with voltage mapping.  Only a small septal scar was observed.  There was no apical or anterior scar demonstrated by voltage mapping today.l 4. Extensive substrate modification with radiofrequency ablation performed along the inferior and inferoseptal portions of the LV scar. 5. No early apparent complications.   01/19/16: LHC Conclusion  Prox LAD to Mid LAD lesion, 85 %stenosed.  Ost Ramus to Ramus lesion, 55 %stenosed.  Prox Cx to Mid Cx lesion, 100 %stenosed.  Ost RCA to Dist RCA lesion, 100 %stenosed.  SVG graft was visualized by angiography and is small.  SVG graft was visualized by angiography and is small.  LIMA graft was visualized by angiography and is large and anatomically normal.  There is moderate left ventricular systolic dysfunction.  LV end diastolic pressure is moderately elevated.  The left ventricular ejection fraction is 35-45% by visual estimate. 1. Severe 3 vessel obstructive CAD 2. Large patent LIMA to the LAD 3. Patent SVG to OM2. This is a tiny graft supplying a very small OM  4. Patent SVG to PDA. This is also a tiny graft supplying a very tiny PDA 5. Moderate LV dysfunction. 6. Elevated LV EDP Plan: medical therapy. Antiarrhythmic drug therapy per EP team.   07/01/14: TTE Study Conclusions - Left ventricle: The cavity size was normal. Wall thickness was normal. Systolic function was moderately reduced. The estimated ejection fraction was in the range of 35% to 40%. Diffuse hypokinesis, the apex and periapical segments appeared most abnormal. Doppler parameters are consistent with abnormal left ventricular relaxation (grade 1 diastolic dysfunction). - Aortic valve: There was no stenosis. - Mitral valve: There was trivial regurgitation. - Left atrium: The atrium was mildly dilated. - Right ventricle: Poorly visualized.  The cavity size was normal. Pacer wire or catheter noted in right ventricle. Systolic function was mildly reduced. - Pulmonary arteries: No complete TR doppler jet so unable to estimate PA systolic pressure. - Inferior vena cava: The vessel was normal in size. The respirophasic diameter changes were in the normal range (= 50%), consistent with normal central venous pressure. Impressions - Technically difficult study with poor acoustic windows. Echo contrast may have helped but was not used (would use in future). Normal LV size with EF 35-40%.  Diffuse hypokinesis, worse in the apex and peri-apical segments. Poorly visualized RV but probably normal size and mildly decreased systolic function.  Recent Labs: 09/19/2019: ALT 15; BUN 30; Creatinine, Ser 1.83; Potassium 5.3; Sodium 135  09/19/2019: Chol/HDL Ratio 5.7; Cholesterol, Total 239; HDL 42; LDL Chol Calc (NIH) 143; Triglycerides 297   CrCl cannot be calculated (Patient's most recent lab result is older than the maximum 21 days allowed.).   Wt Readings from Last 3 Encounters:  06/18/20 215 lb (97.5 kg)  03/12/20 215 lb (97.5 kg)  09/19/19 214 lb 12.8 oz (97.4 kg)     Other studies reviewed: Additional studies/records reviewed today include: summarized above  ASSESSMENT AND PLAN:  1. ICD     normal device function, no changes made  2. VT     Failed sotalol and intolerant of amiodarone     Ablated 2018     NSVT episodes     No symptoms, he states he ahd Dr. Graciela HusbandsKlein have discussed these previously     No changes  3. ICM     Last EF 25-30%% by cath/LV gram Oct 2017     LV lead is off 2/2 high thresholds      exam is euvolemic      OptiVol is stable, no indication of fluid accumulation      On BB/ACE, aldactone      BMET today  3. CAD     medical management     No anginal symptoms     On ASA, BB, fenofibrate, repatha, chart reports statin and zetia allergy/intolernaces     C/w Dr.  SwazilandJordan   Disposition: remotes as usual, in clinic with DR> 1 year, sooner if needed.  Due to see Dr. SwazilandJordan in June.   Current medicines are reviewed at length with the patient today.  The patient did not have any concerns regarding medicines.  Judith BlonderSigned, Renee Ursy, PA-C 06/18/2020 12:59 PM     [facility] [address] [place] [address] [phone] (office)  [phone] (fax)

## 2020-06-18 ENCOUNTER — Other Ambulatory Visit: Payer: Self-pay

## 2020-06-18 ENCOUNTER — Encounter: Payer: Self-pay | Admitting: Physician Assistant

## 2020-06-18 ENCOUNTER — Ambulatory Visit: Payer: Medicare Other | Admitting: Physician Assistant

## 2020-06-18 VITALS — BP 126/64 | HR 82 | Ht 72.0 in | Wt 215.0 lb

## 2020-06-18 DIAGNOSIS — I255 Ischemic cardiomyopathy: Secondary | ICD-10-CM | POA: Diagnosis not present

## 2020-06-18 DIAGNOSIS — I2510 Atherosclerotic heart disease of native coronary artery without angina pectoris: Secondary | ICD-10-CM

## 2020-06-18 DIAGNOSIS — Z95810 Presence of automatic (implantable) cardiac defibrillator: Secondary | ICD-10-CM | POA: Diagnosis not present

## 2020-06-18 DIAGNOSIS — I472 Ventricular tachycardia, unspecified: Secondary | ICD-10-CM

## 2020-06-18 LAB — CUP PACEART INCLINIC DEVICE CHECK
Battery Remaining Longevity: 31 mo
Battery Voltage: 2.95 V
Brady Statistic AP VP Percent: 23.24 %
Brady Statistic AP VS Percent: 0.01 %
Brady Statistic AS VP Percent: 76.48 %
Brady Statistic AS VS Percent: 0.26 %
Brady Statistic RA Percent Paced: 22.8 %
Brady Statistic RV Percent Paced: 97.17 %
Date Time Interrogation Session: 20220311133532
HighPow Impedance: 60 Ohm
Implantable Lead Implant Date: 20130513
Implantable Lead Implant Date: 20130513
Implantable Lead Implant Date: 20130513
Implantable Lead Location: 753858
Implantable Lead Location: 753859
Implantable Lead Location: 753860
Implantable Lead Model: 181
Implantable Lead Model: 4396
Implantable Lead Model: 5076
Implantable Lead Serial Number: 316784
Implantable Pulse Generator Implant Date: 20160304
Lead Channel Impedance Value: 380 Ohm
Lead Channel Impedance Value: 437 Ohm
Lead Channel Impedance Value: 456 Ohm
Lead Channel Impedance Value: 494 Ohm
Lead Channel Impedance Value: 513 Ohm
Lead Channel Impedance Value: 779 Ohm
Lead Channel Pacing Threshold Amplitude: 0.875 V
Lead Channel Pacing Threshold Amplitude: 1 V
Lead Channel Pacing Threshold Pulse Width: 0.4 ms
Lead Channel Pacing Threshold Pulse Width: 0.4 ms
Lead Channel Sensing Intrinsic Amplitude: 1 mV
Lead Channel Sensing Intrinsic Amplitude: 1.125 mV
Lead Channel Sensing Intrinsic Amplitude: 11.5 mV
Lead Channel Sensing Intrinsic Amplitude: 8.625 mV
Lead Channel Setting Pacing Amplitude: 2 V
Lead Channel Setting Pacing Amplitude: 2.5 V
Lead Channel Setting Pacing Pulse Width: 0.4 ms
Lead Channel Setting Sensing Sensitivity: 0.45 mV

## 2020-06-18 MED ORDER — CARVEDILOL 25 MG PO TABS: 25.0000 mg | ORAL_TABLET | Freq: Two times a day (BID) | ORAL | 3 refills | Status: DC

## 2020-06-18 NOTE — Patient Instructions (Addendum)
Medication Instructions:   Your physician recommends that you continue on your current medications as directed. Please refer to the Current Medication list given to you today.   *If you need a refill on your cardiac medications before your next appointment, please call your pharmacy*   Lab Work: BMET TODAY    If you have labs (blood work) drawn today and your tests are completely normal, you will receive your results only by: Marland Kitchen MyChart Message (if you have MyChart) OR . A paper copy in the mail If you have any lab test that is abnormal or we need to change your treatment, we will call you to review the results.   Testing/Procedures: NONE ORDERED  TODAY   Follow-Up: At [facility], you and your health needs are our priority.  As part of our continuing mission to provide you with exceptional heart care, we have created designated Provider Care Teams.  These Care Teams include your primary Cardiologist (physician) and Advanced Practice Providers (APPs -  Physician Assistants and Nurse Practitioners) who all work together to provide you with the care you need, when you need it.  We recommend signing up for the patient portal called "MyChart".  Sign up information is provided on this After Visit Summary.  MyChart is used to connect with patients for Virtual Visits (Telemedicine).  Patients are able to view lab/test results, encounter notes, upcoming appointments, etc.  Non-urgent messages can be sent to your provider as well.   To learn more about what you can do with MyChart, go to [email or web address].    Your next appointment:    DR Swaziland IN June (RECALL)  1 year(s)  The format for your next appointment:   In Person  Provider:   Sherryl Manges, MD   Other Instructions

## 2020-06-19 LAB — BASIC METABOLIC PANEL
BUN/Creatinine Ratio: 14 (ref 10–24)
BUN: 20 mg/dL (ref 8–27)
CO2: 21 mmol/L (ref 20–29)
Calcium: 9.3 mg/dL (ref 8.6–10.2)
Chloride: 101 mmol/L (ref 96–106)
Creatinine, Ser: 1.41 mg/dL — ABNORMAL HIGH (ref 0.76–1.27)
Glucose: 247 mg/dL — ABNORMAL HIGH (ref 65–99)
Potassium: 4.3 mmol/L (ref 3.5–5.2)
Sodium: 137 mmol/L (ref 134–144)
eGFR: 56 mL/min/{1.73_m2} — ABNORMAL LOW (ref >59)

## 2020-07-01 ENCOUNTER — Ambulatory Visit (INDEPENDENT_AMBULATORY_CARE_PROVIDER_SITE_OTHER): Payer: Medicare Other

## 2020-07-01 DIAGNOSIS — I472 Ventricular tachycardia, unspecified: Secondary | ICD-10-CM

## 2020-07-02 LAB — CUP PACEART REMOTE DEVICE CHECK
Battery Remaining Longevity: 30 mo
Battery Voltage: 2.94 V
Brady Statistic AP VP Percent: 20.31 %
Brady Statistic AP VS Percent: 0.01 %
Brady Statistic AS VP Percent: 79.49 %
Brady Statistic AS VS Percent: 0.2 %
Brady Statistic RA Percent Paced: 20.23 %
Brady Statistic RV Percent Paced: 99.3 %
Date Time Interrogation Session: 20220324073724
HighPow Impedance: 57 Ohm
Implantable Lead Implant Date: 20130513
Implantable Lead Implant Date: 20130513
Implantable Lead Implant Date: 20130513
Implantable Lead Location: 753858
Implantable Lead Location: 753859
Implantable Lead Location: 753860
Implantable Lead Model: 181
Implantable Lead Model: 4396
Implantable Lead Model: 5076
Implantable Lead Serial Number: 316784
Implantable Pulse Generator Implant Date: 20160304
Lead Channel Impedance Value: 380 Ohm
Lead Channel Impedance Value: 437 Ohm
Lead Channel Impedance Value: 437 Ohm
Lead Channel Impedance Value: 456 Ohm
Lead Channel Impedance Value: 494 Ohm
Lead Channel Impedance Value: 760 Ohm
Lead Channel Pacing Threshold Amplitude: 0.75 V
Lead Channel Pacing Threshold Amplitude: 1 V
Lead Channel Pacing Threshold Pulse Width: 0.4 ms
Lead Channel Pacing Threshold Pulse Width: 0.4 ms
Lead Channel Sensing Intrinsic Amplitude: 1.25 mV
Lead Channel Sensing Intrinsic Amplitude: 1.25 mV
Lead Channel Sensing Intrinsic Amplitude: 11.5 mV
Lead Channel Sensing Intrinsic Amplitude: 8.625 mV
Lead Channel Setting Pacing Amplitude: 2.25 V
Lead Channel Setting Pacing Amplitude: 2.5 V
Lead Channel Setting Pacing Pulse Width: 0.4 ms
Lead Channel Setting Sensing Sensitivity: 0.45 mV

## 2020-07-13 NOTE — Progress Notes (Signed)
Remote ICD transmission.   

## 2020-07-19 ENCOUNTER — Other Ambulatory Visit: Payer: Self-pay | Admitting: Cardiology

## 2020-09-30 ENCOUNTER — Ambulatory Visit (INDEPENDENT_AMBULATORY_CARE_PROVIDER_SITE_OTHER): Payer: Medicare Other

## 2020-09-30 DIAGNOSIS — I255 Ischemic cardiomyopathy: Secondary | ICD-10-CM

## 2020-09-30 LAB — CUP PACEART REMOTE DEVICE CHECK
Battery Remaining Longevity: 29 mo
Battery Voltage: 2.93 V
Brady Statistic AP VP Percent: 17.28 %
Brady Statistic AP VS Percent: 0 %
Brady Statistic AS VP Percent: 82.53 %
Brady Statistic AS VS Percent: 0.18 %
Brady Statistic RA Percent Paced: 17.15 %
Brady Statistic RV Percent Paced: 98.79 %
Date Time Interrogation Session: 20220623073830
HighPow Impedance: 64 Ohm
Implantable Lead Implant Date: 20130513
Implantable Lead Implant Date: 20130513
Implantable Lead Implant Date: 20130513
Implantable Lead Location: 753858
Implantable Lead Location: 753859
Implantable Lead Location: 753860
Implantable Lead Model: 181
Implantable Lead Model: 4396
Implantable Lead Model: 5076
Implantable Lead Serial Number: 316784
Implantable Pulse Generator Implant Date: 20160304
Lead Channel Impedance Value: 399 Ohm
Lead Channel Impedance Value: 437 Ohm
Lead Channel Impedance Value: 456 Ohm
Lead Channel Impedance Value: 456 Ohm
Lead Channel Impedance Value: 532 Ohm
Lead Channel Impedance Value: 779 Ohm
Lead Channel Pacing Threshold Amplitude: 0.75 V
Lead Channel Pacing Threshold Amplitude: 1 V
Lead Channel Pacing Threshold Pulse Width: 0.4 ms
Lead Channel Pacing Threshold Pulse Width: 0.4 ms
Lead Channel Sensing Intrinsic Amplitude: 1.25 mV
Lead Channel Sensing Intrinsic Amplitude: 1.25 mV
Lead Channel Sensing Intrinsic Amplitude: 10.625 mV
Lead Channel Sensing Intrinsic Amplitude: 10.625 mV
Lead Channel Setting Pacing Amplitude: 2 V
Lead Channel Setting Pacing Amplitude: 2.5 V
Lead Channel Setting Pacing Pulse Width: 0.4 ms
Lead Channel Setting Sensing Sensitivity: 0.45 mV

## 2020-09-30 NOTE — Progress Notes (Addendum)
[name] Date of Birth: [birth year] Medical Record #[id number]  History of Present Illness: [name] is seen for follow up CAD and CHF.  He has an ischemic CM and has CRT-D in place for primary prevention since April of 2013. EF by Echo was 45-50% at that time. Cardiac MRI showed an EF of 31%. Other issues include HLD (statin intolerant and intolerant to zetia), hemochromatosis, RBBB, PAF, type 2 DM and OSA. He is s/p CABG in 2006 following an MI. Cardiac cath in February 2013 showed patrent grafts. After CRT therapy he did have a significant improvement in dyspnea.   He was admitted in October 2017 with VT and received shocks. Cardiac cath demonstrated patent grafts. He was started on sotalol. In January 2018 he presented with syncope and recurrent VT. Switched to amiodarone. Was concerned about taking amiodarone long term. Underwent VT ablation by Dr. Johney FrameAllred on June 08, 2016. Amiodarone later discontinued.  Last device check in Decmber showed 3 episodes of VT that were pace terminated. Approx. 15 runs of NSVT.  Last Echo in January 2018 showed EF 40-45%.  He is intolerant of niacin, statins (Simcor and Vytorin) and Zetia. Was seen by Dr Rennis GoldenHilty a year ago and approved for Repatha. Patient decided not to start at that time.    On a prior visit we attempted to initiate him on Entresto. Lisinopril was washed out. Lasix changed to prn. Even at lower dose he was unable to tolerate Entresto due to hypotension with pressures in the 80s systolic and symptoms of lightheadedness and dizziness. Lisinopril resumed.   He had a televisit in January 2021 with Dr Graciela HusbandsKlein. Asymptomatic. Echo ordered showing a lower EF than before. Last device check 07/03/19 showed 9 episodes of NSVT. No device therapies. Last device check in March showed some NSVT but no therapies. Optivol was stable.   On follow up today he is doing well. No symptomatic VT.  Weight is stable. Still gets dizzy if he bends over and stands up. Tends  to give out easy but is very active doing yard work and gardening. Denies any chest pain or increased dyspnea. No edema. States Repatha caused his A1c to go up from 6.1 to 9. He stopped Repatha and his A1c came back down. He also reports Dr Jacky KindleAronson recommended London PepperJardiance but it was going to cost $202/month and so he did not fill.    Current Outpatient Medications  Medication Sig Dispense Refill   aspirin 81 MG tablet Take 81 mg by mouth daily.     carvedilol (COREG) 25 MG tablet Take 1 tablet (25 mg total) by mouth 2 (two) times daily with a meal. 180 tablet 3   fenofibrate 160 MG tablet TAKE 1 TABLET BY MOUTH DAILY. 90 tablet 3   furosemide (LASIX) 20 MG tablet TAKE 1/2 TABLET BY MOUTH DAILY. 45 tablet 10   lisinopril (ZESTRIL) 5 MG tablet Take 10 mg in the morning and 5 mg in the evening 270 tablet 3   magnesium oxide (MAG-OX) 400 MG tablet Take 1 tablet (400 mg total) by mouth 2 (two) times daily. Please make overdue appt with Dr. Graciela HusbandsKlein before anymore refills. Thank you 1st attempt 60 tablet 0   metFORMIN (GLUCOPHAGE) 500 MG tablet Take 500 mg by mouth 2 (two) times daily with a meal.     NITROSTAT 0.4 MG SL tablet PLACE 1 TABLET UNDER THE TONGUE EVERY 5 MINUTES AS NEEDED FOR CHEST PAIN FOR UP TO 3 DOSES. 25 tablet 3  ONETOUCH VERIO test strip 1 each by Other route as needed for other.   6   spironolactone (ALDACTONE) 25 MG tablet Take 25 mg by mouth as directed. Take 1/2 tablet daily     No current facility-administered medications for this visit.    Allergies  Allergen Reactions   Entresto [Sacubitril-Valsartan] Other (See Comments)    Hypotension, increased heart rate, dizziness, blurry vision   Statins Other (See Comments)    Myalgias     Past Medical History:  Diagnosis Date   AICD (automatic cardioverter/defibrillator) present    Anemia    Arthritis    "mild in my joints & knuckles" (05/10/2016)   CHF (congestive heart failure) (HCC)    Coronary artery disease     Hemochromatosis    Hyperlipidemia    Ischemic cardiomyopathy    EF 36%; prior anterior MI, s/p CABG x 3; s/p cath Feb 2013 showing grafts to be patent with severe LV dysfunction   Myocardial infarction (HCC) 2006   PAF (paroxysmal atrial fibrillation) (HCC)    Pneumonia 2000   RBBB (right bundle branch block)    Sleep apnea    "can't tolerate mask" (05/10/2016)   Type II diabetes mellitus (HCC)    Ventricular tachycardia, 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    Past Surgical History:  Procedure Laterality Date   BI-VENTRICULAR IMPLANTABLE CARDIOVERTER DEFIBRILLATOR N/A 08/21/2011   Procedure: BI-VENTRICULAR IMPLANTABLE CARDIOVERTER DEFIBRILLATOR  (CRT-D);  Surgeon: Duke Salvia, MD;  Location: [facility] CATH LAB;  Service: Cardiovascular;  Laterality: N/A;   BIV ICD GENERTAOR CHANGE OUT N/A 06/12/2014   Procedure: BIV ICD GENERTAOR CHANGE OUT;  Surgeon: Duke Salvia, MD;  Location: [facility] CATH LAB;  Service: Cardiovascular;  Laterality: N/A;   CARDIAC CATHETERIZATION N/A 01/19/2016   Procedure: Left Heart Cath and Cors/Grafts Angiography;  Surgeon: Rozina Pointer M [place], MD;  Location: [facility] INVASIVE CV LAB;  Service: Cardiovascular;  Laterality: N/A;   CORONARY ARTERY BYPASS GRAFT  2006   Emergent CABG x 3 with LIMA to LAD, SVG to OM, SVG to distal RCA per Dr. Tyrone Sage   FRACTURE SURGERY     INGUINAL HERNIA REPAIR  02/09/2012   Procedure: HERNIA REPAIR INGUINAL ADULT;  Surgeon: Cherylynn Ridges, MD;  Location: [facility] OR;  Service: General;  Laterality: Right;   INGUINAL HERNIA REPAIR Bilateral [phone]   INSERTION OF MESH  02/09/2012   Procedure: INSERTION OF MESH;  Surgeon: Cherylynn Ridges, MD;  Location: [facility] OR;  Service: General;  Laterality: Right;   LEFT HEART CATHETERIZATION WITH CORONARY/GRAFT ANGIOGRAM N/A 05/25/2011   Procedure: LEFT HEART CATHETERIZATION WITH Isabel Caprice;  Surgeon: Alissa Pharr M [place], MD;  Location: [facility] CATH LAB;  Service: Cardiovascular;  Laterality: N/A;   LIVER BIOPSY  1990s   SHOULDER  OPEN ROTATOR CUFF REPAIR Bilateral ?2000/~ 2007   left; right    V TACH ABLATION N/A 06/08/2016   Procedure: V Tach Ablation;  Surgeon: Hillis Range, MD;  Location: [facility] INVASIVE CV LAB;  Service: Cardiovascular;  Laterality: N/A;   VENTRICULAR ABLATION SURGERY  06/08/2016   WRIST FRACTURE SURGERY Left 06/2005   "plates and screws and cadavaer bone"    Social History   Tobacco Use  Smoking Status Former   Packs/day: 2.00   Years: 30.00   Pack years: 60.00   Types: Cigarettes   Quit date: 03/11/2005   Years since quitting: 15.5  Smokeless Tobacco Never    Social History   Substance and Sexual Activity  Alcohol Use Yes  Alcohol/week: 3.0 standard drinks   Types: 3 Cans of beer per week    Family History  Adopted: Yes    Review of Systems: The review of systems is per the HPI.  All other systems were reviewed and are negative.  Physical Exam: BP 104/66 (BP Location: Left Arm, Patient Position: Sitting, Cuff Size: Normal)   Pulse 80   Ht 6' (1.829 m)   Wt 214 lb (97.1 kg)   BMI 29.02 kg/m  GENERAL:  Well appearing WM in NAD HEENT:  PERRL, EOMI, sclera are clear. Oropharynx is clear. NECK:  No jugular venous distention, carotid upstroke brisk and symmetric, no bruits, no thyromegaly or adenopathy LUNGS:  Clear to auscultation bilaterally CHEST:  Unremarkable HEART:  RRR,  PMI not displaced or sustained,S1 and S2 within normal limits, no S3, no S4: no clicks, no rubs, no murmurs ABD:  Soft, nontender. BS +, no masses or bruits. No hepatomegaly, no splenomegaly EXT:  2 + pulses throughout, no edema, no cyanosis no clubbing SKIN:  Warm and dry.  No rashes NEURO:  Alert and oriented x 3. Cranial nerves II through XII intact. PSYCH:  Cognitively intact       Wt Readings from Last 3 Encounters:  10/04/20 214 lb (97.1 kg)  06/18/20 215 lb (97.5 kg)  03/12/20 215 lb (97.5 kg)   LABORATORY DATA:  PENDING  Lab Results  Component Value Date   WBC 5.5 06/02/2016   HGB  12.0 (L) 06/02/2016   HCT 34.8 (L) 06/02/2016   PLT 213 06/02/2016   GLUCOSE 247 (H) 06/18/2020   CHOL 239 (H) 09/19/2019   TRIG 297 (H) 09/19/2019   HDL 42 09/19/2019   LDLDIRECT 206.0 04/14/2013   LDLCALC 143 (H) 09/19/2019   ALT 15 09/19/2019   AST 17 09/19/2019   NA 137 06/18/2020   K 4.3 06/18/2020   CL 101 06/18/2020   CREATININE 1.41 (H) 06/18/2020   BUN 20 06/18/2020   CO2 21 06/18/2020   TSH 2.970 09/03/2018   INR 1.05 01/19/2016   HGBA1C 6.0 10/06/2010   Labs dated 07/28/16: cholesterol 285, triglycerides 252, HDL 41, LDL 194.  Dated 12/13/16: A1c 6.1%.  Dated 09/18/18: cholesterol 229, triglycerides 200, HDL 35, LDL 154. LFTs normal. CBC normal Dated 01/16/19: normal TSH Dated 05/19/19. A1c 5.9% Dated 06/17/19: BUN 24, creatinine 1.63.  Dated 11/19/19: A1c 6.9%. cholesterol 124, triglycerides 206, HDL 44, LDL 39. Creatinine 1.8. BUN 21. Otherwise CMET, CBC and TSH normal. Dated 06/18/20: creatinine 1.41. otherwise BMET normal. Dated 07/28/20: A1c 6.9%.  Ecg today shows AV pacing rate 80. I have personally reviewed and interpreted this study.   Echo 04/13/16: Study Conclusions   - Left ventricle: The cavity size was mildly dilated. Systolic   function was mildly to moderately reduced. The estimated ejection   fraction was in the range of 40% to 45%. Diffuse hypokinesis.   Akinesis of the apical myocardium. Doppler parameters are   consistent with abnormal left ventricular relaxation (grade 1   diastolic dysfunction). Doppler parameters are consistent with   indeterminate ventricular filling pressure. - Aortic valve: Transvalvular velocity was within the normal range.   There was no stenosis. There was no regurgitation. - Mitral valve: Transvalvular velocity was within the normal range.   There was no evidence for stenosis. There was trivial   regurgitation. - Right ventricle: The cavity size was normal. Wall thickness was   normal. - Tricuspid valve: There was trivial  regurgitation. - Pulmonary arteries: Systolic  pressure was within the normal   range. PA peak pressure: 25 mm Hg (S).  Cardiac cath 01/19/16: Procedures   Left Heart Cath and Cors/Grafts Angiography  Conclusion     Prox LAD to Mid LAD lesion, 85 %stenosed. Ost Ramus to Ramus lesion, 55 %stenosed. Prox Cx to Mid Cx lesion, 100 %stenosed. Ost RCA to Dist RCA lesion, 100 %stenosed. SVG graft was visualized by angiography and is small. SVG graft was visualized by angiography and is small. LIMA graft was visualized by angiography and is large and anatomically normal. There is moderate left ventricular systolic dysfunction. LV end diastolic pressure is moderately elevated. The left ventricular ejection fraction is 35-45% by visual estimate.   1. Severe 3 vessel obstructive CAD 2. Large patent LIMA to the LAD 3. Patent SVG to OM2. This is a tiny graft supplying a very small OM  4. Patent SVG to PDA. This is also a tiny graft supplying a very tiny PDA 5. Moderate LV dysfunction. 6. Elevated LV EDP   Plan: medical therapy. Antiarrhythmic drug therapy per EP team.     Echo 05/19/19: IMPRESSIONS     1. Left ventricular ejection fraction, by estimation, is 25 to 30%. The  left ventricle has severely decreased function. There is severe akinesis  of the left ventricular, mid-apical anteroseptal wall.   2. Right ventricular systolic function is severely reduced. The right  ventricular size is mildly enlarged.   3. Mild to moderate mitral valve regurgitation.   Assessment / Plan: 1. Chronic systolic CHF status post CRT. EF reported 40-45% in 2018  but I thought at the time that this was overestimated. EF 25-30% last year.  Clinically  class  II-III. He was unable to tolerate Entresto due to significant hypotension. Now on lisinopril 10 mg in am and 5 mg in pm, Coreg 25 mg bid, aldactone 25 mg daily and lasix 10 mg daily. Tolerating well. Would like to get him on a SGLT2 inhibitor. Will ask  pharmacy to review Marcelline Deist or Jardiance or see if any patient assistance is available.   2. Ischemic cardiomyopathy. See above.  3. Coronary disease status post CABG in 2006. Cardiac catheterization in October 2017 showed all grafts were patent. Clinically without angina  4. Right bundle branch block.   5. Hyperlipidemia. Patient is intolerant to statins. He was on fenofibrate and fish oil only. With addition of Repatha excellent response with LDL down to 39. Patient stopped taking Repatha due to increase in sugar. Will be getting follow up lab work in August with Dr Jacky Kindle. May need to consider Nexlizet or inclisiran.   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  Off AAD therapy. ICD check shows some runs of NSVT. No symptomatic episodes.   7. CKD last creatinine improved.   8. DM type 2. Would consider addition of an SGLT 2 inhibitor given CHF and DM. Will see if there is one that is affordable.   I will follow up in 6 months.

## 2020-10-04 ENCOUNTER — Other Ambulatory Visit: Payer: Self-pay

## 2020-10-04 ENCOUNTER — Ambulatory Visit (INDEPENDENT_AMBULATORY_CARE_PROVIDER_SITE_OTHER): Payer: Medicare Other | Admitting: Cardiology

## 2020-10-04 ENCOUNTER — Encounter: Payer: Self-pay | Admitting: Cardiology

## 2020-10-04 VITALS — BP 104/66 | HR 80 | Ht 72.0 in | Wt 214.0 lb

## 2020-10-04 DIAGNOSIS — I472 Ventricular tachycardia, unspecified: Secondary | ICD-10-CM

## 2020-10-04 DIAGNOSIS — I5022 Chronic systolic (congestive) heart failure: Secondary | ICD-10-CM

## 2020-10-04 DIAGNOSIS — Z95810 Presence of automatic (implantable) cardiac defibrillator: Secondary | ICD-10-CM | POA: Diagnosis not present

## 2020-10-04 DIAGNOSIS — I2510 Atherosclerotic heart disease of native coronary artery without angina pectoris: Secondary | ICD-10-CM | POA: Diagnosis not present

## 2020-10-04 DIAGNOSIS — E782 Mixed hyperlipidemia: Secondary | ICD-10-CM

## 2020-10-05 NOTE — Progress Notes (Signed)
Spoke to patient advised London Pepper or Marcelline Deist will cost $47 per month, $131 per 90 days.Stated he will discuss with wife and call back.

## 2020-10-19 NOTE — Progress Notes (Signed)
Remote ICD transmission.   

## 2020-12-20 ENCOUNTER — Other Ambulatory Visit: Payer: Self-pay | Admitting: Cardiology

## 2021-02-14 ENCOUNTER — Ambulatory Visit: Payer: Medicare Other | Admitting: Podiatry

## 2021-02-14 ENCOUNTER — Encounter: Payer: Self-pay | Admitting: Podiatry

## 2021-02-14 ENCOUNTER — Other Ambulatory Visit: Payer: Self-pay

## 2021-02-14 DIAGNOSIS — L600 Ingrowing nail: Secondary | ICD-10-CM

## 2021-02-14 NOTE — Patient Instructions (Signed)

## 2021-02-14 NOTE — Progress Notes (Signed)
Subjective:   Patient ID: [name], male   DOB: 65 y.o.   MRN: [id number]   HPI Patient presents stating he has developed a lot of pain in his right big toe and the nail corner and had a history of having his left 1 corrected had questions concerning this and also has had some issues with cardiac and wants his feet checked in general.  Patient does not currently smoke likes to be active if possible   Review of Systems  All other systems reviewed and are negative.      Objective:  Physical Exam Vitals and nursing note reviewed.  Constitutional:      Appearance: He is well-developed.  Pulmonary:     Effort: Pulmonary effort is normal.  Musculoskeletal:        General: Normal range of motion.  Skin:    General: Skin is warm.  Neurological:     Mental Status: He is alert.    Neurovascular status intact muscle strength was found to be adequate range of motion adequate with good digital perfusion well oriented with history of correction of the left big toenail that looks reasonably good at this time with an incurvated lateral border the right hallux painful when pressed no active drainage or redness noted     Assessment:  Patient to has had chronic issues with incurvated nail bed right hallux diabetes under excellent control     Plan:  H&P reviewed condition at great length reviewed options and he wants a permanent procedure understanding risk.  I allowed him to read and then signed consent form I infiltrated the right hallux 60 mg like Marcaine mixture sterile prep done and using sterile instrumentation remove the lateral border exposed matrix applied phenol 3 applications 30 seconds followed by alcohol by sterile dressing gave instructions on soaks and to leave dressing on 24 hours but take it off earlier if it should throb and encouraged to call questions concerns

## 2021-03-14 ENCOUNTER — Other Ambulatory Visit: Payer: Self-pay | Admitting: Cardiology

## 2021-03-31 ENCOUNTER — Ambulatory Visit (INDEPENDENT_AMBULATORY_CARE_PROVIDER_SITE_OTHER): Payer: Medicare Other

## 2021-03-31 DIAGNOSIS — I255 Ischemic cardiomyopathy: Secondary | ICD-10-CM | POA: Diagnosis not present

## 2021-04-10 LAB — CUP PACEART REMOTE DEVICE CHECK
Battery Remaining Longevity: 20 mo
Battery Voltage: 2.91 V
Brady Statistic AP VP Percent: 13.88 %
Brady Statistic AP VS Percent: 0.01 %
Brady Statistic AS VP Percent: 85.8 %
Brady Statistic AS VS Percent: 0.32 %
Brady Statistic RA Percent Paced: 13.75 %
Brady Statistic RV Percent Paced: 98.31 %
Date Time Interrogation Session: 20221230160612
HighPow Impedance: 64 Ohm
Implantable Lead Implant Date: 20130513
Implantable Lead Implant Date: 20130513
Implantable Lead Implant Date: 20130513
Implantable Lead Location: 753858
Implantable Lead Location: 753859
Implantable Lead Location: 753860
Implantable Lead Model: 181
Implantable Lead Model: 4396
Implantable Lead Model: 5076
Implantable Lead Serial Number: 316784
Implantable Pulse Generator Implant Date: 20160304
Lead Channel Impedance Value: 399 Ohm
Lead Channel Impedance Value: 399 Ohm
Lead Channel Impedance Value: 437 Ohm
Lead Channel Impedance Value: 494 Ohm
Lead Channel Impedance Value: 532 Ohm
Lead Channel Impedance Value: 817 Ohm
Lead Channel Pacing Threshold Amplitude: 0.75 V
Lead Channel Pacing Threshold Amplitude: 0.875 V
Lead Channel Pacing Threshold Pulse Width: 0.4 ms
Lead Channel Pacing Threshold Pulse Width: 0.4 ms
Lead Channel Sensing Intrinsic Amplitude: 1.25 mV
Lead Channel Sensing Intrinsic Amplitude: 1.25 mV
Lead Channel Sensing Intrinsic Amplitude: 10.5 mV
Lead Channel Sensing Intrinsic Amplitude: 10.5 mV
Lead Channel Setting Pacing Amplitude: 2 V
Lead Channel Setting Pacing Amplitude: 2.5 V
Lead Channel Setting Pacing Pulse Width: 0.4 ms
Lead Channel Setting Sensing Sensitivity: 0.45 mV

## 2021-04-12 ENCOUNTER — Other Ambulatory Visit: Payer: Self-pay

## 2021-04-12 ENCOUNTER — Other Ambulatory Visit (HOSPITAL_COMMUNITY): Payer: Self-pay | Admitting: Internal Medicine

## 2021-04-12 ENCOUNTER — Ambulatory Visit (HOSPITAL_COMMUNITY): Payer: Medicare Other | Attending: Cardiovascular Disease

## 2021-04-12 DIAGNOSIS — I5022 Chronic systolic (congestive) heart failure: Secondary | ICD-10-CM | POA: Insufficient documentation

## 2021-04-12 LAB — ECHOCARDIOGRAM COMPLETE
Area-P 1/2: 6.12 cm2
Calc EF: 28.9 %
S' Lateral: 5.4 cm
Single Plane A2C EF: 26.9 %
Single Plane A4C EF: 32.7 %

## 2021-04-12 NOTE — Progress Notes (Signed)
Remote ICD transmission.   

## 2021-04-18 ENCOUNTER — Telehealth: Payer: Self-pay | Admitting: Cardiology

## 2021-04-18 NOTE — Telephone Encounter (Signed)
Pt is following up for echo results... please advise

## 2021-04-18 NOTE — Telephone Encounter (Signed)
Spoke with patient and gave him this information from [employer]: "Echo shows LVEF 25-30% which is reduced heart pumping function though stable compared to prior echocardiogram in 2021. Heart is moderately stiff and pressures in lungs moderately elevated. He should continue current medications until his upcoming appointment 05/06/21 with her." Patient voiced understanding and will keep his appointment on 1/27.

## 2021-04-18 NOTE — Telephone Encounter (Signed)
Echocardiogram was ordered by primary care provider. Not yet resulted by PCP that I am able to see. Will route to CMA team to request records from PCP prior to his upcoming appointment with me 05/06/21.   Echo shows LVEF 25-30% which is reduced heart pumping function though stable compared to prior echocardiogram in 2021. Heart is moderately stiff and pressures in lungs moderately elevated. He should continue current medications until his upcoming appointment 05/06/21 with me. Will ask triage team to update patient.   Alver Sorrow, NP

## 2021-04-18 NOTE — Telephone Encounter (Signed)
Called PCP office to request recent office notes along with labs, EKGs, and any other cardiac testing other than recent echo. Left message---gave our fax number and our phone number in case of questions.

## 2021-05-05 NOTE — Progress Notes (Signed)
Office Visit    Patient Name: [name] Date of Encounter: 05/06/2021  PCP:  Burnard Bunting, MD   [place]  Cardiologist:  Peter Martinique, MD  Advanced Practice Provider:  No care team member to display Electrophysiologist:  None      Chief Complaint    [name] is a 66 y.o. male with a hx of CAD s/p CABG, CHF, CRT-D, hyperlipidemia, hemochromatosis, right bundle branch block, paroxysmal atrial fibrillation, DM2, OSA presents today for shortness of breath  Past Medical History    Past Medical History:  Diagnosis Date   AICD (automatic cardioverter/defibrillator) present    Anemia    Arthritis    "mild in my joints & knuckles" (05/10/2016)   CHF (congestive heart failure) ([place])    Coronary artery disease    Hemochromatosis    Hyperlipidemia    Ischemic cardiomyopathy    EF 36%; prior anterior MI, s/p CABG x 3; s/p cath Feb 2013 showing grafts to be patent with severe LV dysfunction   Myocardial infarction ([place]) 2006   PAF (paroxysmal atrial fibrillation) ([place])    Pneumonia 2000   RBBB (right bundle branch block)    Sleep apnea    "can't tolerate mask" (05/10/2016)   Type II diabetes mellitus ([place])    Ventricular tachycardia, sustained 01/19/2016   Past Surgical History:  Procedure Laterality Date   BI-VENTRICULAR IMPLANTABLE CARDIOVERTER DEFIBRILLATOR N/A 08/21/2011   Procedure: BI-VENTRICULAR IMPLANTABLE CARDIOVERTER DEFIBRILLATOR  (CRT-D);  Surgeon: Deboraha Sprang, MD;  Location: [facility] CATH LAB;  Service: Cardiovascular;  Laterality: N/A;   BIV ICD GENERTAOR CHANGE OUT N/A 06/12/2014   Procedure: BIV ICD GENERTAOR CHANGE OUT;  Surgeon: Deboraha Sprang, MD;  Location: [facility] CATH LAB;  Service: Cardiovascular;  Laterality: N/A;   CARDIAC CATHETERIZATION N/A 01/19/2016   Procedure: Left Heart Cath and Cors/Grafts Angiography;  Surgeon: Peter M Martinique, MD;  Location: [facility] CV LAB;  Service: Cardiovascular;  Laterality: N/A;    CORONARY ARTERY BYPASS GRAFT  2006   Emergent CABG x 3 with LIMA to LAD, SVG to OM, SVG to distal RCA per Dr. Servando Snare   FRACTURE SURGERY     INGUINAL HERNIA REPAIR  02/09/2012   Procedure: HERNIA REPAIR INGUINAL ADULT;  Surgeon: Gwenyth Ober, MD;  Location: [place];  Service: General;  Laterality: Right;   INGUINAL HERNIA REPAIR Bilateral [phone]   INSERTION OF MESH  02/09/2012   Procedure: INSERTION OF MESH;  Surgeon: Gwenyth Ober, MD;  Location: [place];  Service: General;  Laterality: Right;   LEFT HEART CATHETERIZATION WITH CORONARY/GRAFT ANGIOGRAM N/A 05/25/2011   Procedure: LEFT HEART CATHETERIZATION WITH Beatrix Fetters;  Surgeon: Peter M Martinique, MD;  Location: [facility] CATH LAB;  Service: Cardiovascular;  Laterality: N/A;   LIVER BIOPSY  1990s   SHOULDER OPEN ROTATOR CUFF REPAIR Bilateral ?2000/~ 2007   left; right    V TACH ABLATION N/A 06/08/2016   Procedure: V Tach Ablation;  Surgeon: Thompson Grayer, MD;  Location: [place] CV LAB;  Service: Cardiovascular;  Laterality: N/A;   VENTRICULAR ABLATION SURGERY  06/08/2016   WRIST FRACTURE SURGERY Left 06/2005   "plates and screws and cadavaer bone"    Allergies  Allergies  Allergen Reactions   Entresto [Sacubitril-Valsartan] Other (See Comments)    Hypotension, increased heart rate, dizziness, blurry vision   Linagliptin-Metformin Hcl     Other reaction(s): chest pain, GI upset   Evolocumab     Other reaction(s):  Elevated BS   Statins Other (See Comments)    Myalgias     History of Present Illness    [name] is a 66 y.o. male with a hx of CAD s/p CABG, CHF, CRT-D, hyperlipidemia, hemochromatosis, right bundle branch block, paroxysmal atrial fibrillation, DM2, OSA last seen 10/04/20 by Dr. Martinique.  He underwent CABG in 2006 following an MI.  Cardiac cath 2013 showed patent grafts.  He has had CRT-D in place for primary prevention since April 2013.  Echo at the time showed LVEF 45-50% with cardiac MRI LVEF 31%.  He  did have improvement in dyspnea after CRT therapy.  He has been intolerant to statins (Simcor, Vytorin) as well as Zetia.  Admitted October 2017 with VT and received shocks.  Cardiac cath with patent grafts.  Sotalol was started.  January 2018 he presented with syncope and recurrent VT and was switched to amiodarone.  He underwent VT ablation by Dr. Rayann Heman 06/2016 and amiodarone was discontinued.  He has been unable to tolerate Entresto due to hypotension.  He did trial Repatha and had excellent response with LDL down to 39 then noted to elevated his A1c and he discontinued.  Labs 04/06/21 via PCP: WBC 5.2, hemoglobin 13.8, hematocrit 39.2 Creatinine 1.7, BUN 25, GFR 40.7, K4.3  He had chest x-ray 04/06/2021 with probable bibasilar infiltrates.  He was treated with Levaquin. He had echocardiogram 04/12/2021 ordered by primary care provider which showed LVEF 25-30%, wall motion abnormalities, LV moderately dilated, grade 2 diastolic dysfunction, moderately elevated PASP, trivial MR.  He presents today for follow up. Weight up 7 pounds compared to 6 months ago. Tells me he has been having worsening dyspnea over 2 months. Though has resolved after Levaquin and doubling his Lasix dose for 1 week.  Was no longer gurgling, coughing. Feels his breathing is back to normal. He had no chest pain, pressure, tightness. BP at home 105-110/65-70.  No lightheadedness, dizziness, near-syncope, syncope.  EKGs/Labs/Other Studies Reviewed:   The following studies were reviewed today:  Echo 04/12/21 1. Left ventricular ejection fraction, by estimation, is 25 to 30%. Left  ventricular ejection fraction by 2D MOD biplane is 28.9 %. Left  ventricular ejection fraction by PLAX is 27 %. The left ventricle has  severely decreased function. The left  ventricle demonstrates regional wall motion abnormalities (see scoring  diagram/findings for description). The left ventricular internal cavity  size was moderately dilated.  Left ventricular diastolic parameters are  consistent with Grade II diastolic  dysfunction (pseudonormalization).   2. Right ventricular systolic function is normal. The right ventricular  size is normal. There is moderately elevated pulmonary artery systolic  pressure.   3. Right atrial size was moderately dilated.   4. The mitral valve is normal in structure. Trivial mitral valve  regurgitation. No evidence of mitral stenosis.   5. The aortic valve is tricuspid. Aortic valve regurgitation is not  visualized. No aortic stenosis is present.   6. The inferior vena cava is normal in size with greater than 50%  respiratory variability, suggesting right atrial pressure of 3 mmHg.   LHC 2017 Prox LAD to Mid LAD lesion, 85 %stenosed. Ost Ramus to Ramus lesion, 55 %stenosed. Prox Cx to Mid Cx lesion, 100 %stenosed. Ost RCA to Dist RCA lesion, 100 %stenosed. SVG graft was visualized by angiography and is small. SVG graft was visualized by angiography and is small. LIMA graft was visualized by angiography and is large and anatomically normal. There is moderate left  ventricular systolic dysfunction. LV end diastolic pressure is moderately elevated. The left ventricular ejection fraction is 35-45% by visual estimate.   1. Severe 3 vessel obstructive CAD 2. Large patent LIMA to the LAD 3. Patent SVG to OM2. This is a tiny graft supplying a very small OM  4. Patent SVG to PDA. This is also a tiny graft supplying a very tiny PDA 5. Moderate LV dysfunction. 6. Elevated LV EDP   Plan: medical therapy. Antiarrhythmic drug therapy per EP team.  EKG:  EKG is  ordered today.  The ekg ordered today demonstrates ventricular paced rhythm 84 bpm with occasional PVC.   Recent Labs: 06/18/2020: BUN 20; Creatinine, Ser 1.41; Potassium 4.3; Sodium 137  Recent Lipid Panel    Component Value Date/Time   CHOL 239 (H) 09/19/2019 1109   TRIG 297 (H) 09/19/2019 1109   HDL 42 09/19/2019 1109   CHOLHDL 5.7  (H) 09/19/2019 1109   CHOLHDL 6.6 05/11/2016 0209   VLDL 37 05/11/2016 0209   LDLCALC 143 (H) 09/19/2019 1109   LDLDIRECT 206.0 04/14/2013 0958    Home Medications   Current Meds  Medication Sig   aspirin 81 MG tablet Take 81 mg by mouth daily.   carvedilol (COREG) 25 MG tablet Take 1 tablet (25 mg total) by mouth 2 (two) times daily with a meal.   fenofibrate 160 MG tablet TAKE 1 TABLET BY MOUTH DAILY.   furosemide (LASIX) 20 MG tablet TAKE 1/2 TABLET BY MOUTH DAILY.   lisinopril (ZESTRIL) 10 MG tablet TAKE 1 TABLET (10 MG) BY MOUTH IN THE MORNING AND 1/2 TABLET (5 MG) IN THE EVENING. *INSURANCE PLAN REQUIRES 10 MG TABS TO BE COVERED*   magnesium oxide (MAG-OX) 400 MG tablet Take 1 tablet (400 mg total) by mouth 2 (two) times daily. Please make overdue appt with Dr. Caryl Comes before anymore refills. Thank you 1st attempt   metFORMIN (GLUCOPHAGE) 500 MG tablet Take 500 mg by mouth 2 (two) times daily with a meal.   NITROSTAT 0.4 MG SL tablet PLACE 1 TABLET UNDER THE TONGUE EVERY 5 MINUTES AS NEEDED FOR CHEST PAIN FOR UP TO 3 DOSES.   ONETOUCH VERIO test strip 1 each by Other route as needed for other.    spironolactone (ALDACTONE) 25 MG tablet Take 0.5 tablets (12.5 mg total) by mouth daily.     Review of Systems      All other systems reviewed and are otherwise negative except as noted above.  Physical Exam    VS:  BP 108/68    Pulse 81    Ht 6' (1.829 m)    Wt 221 lb (100.2 kg)    SpO2 96%    BMI 29.97 kg/m  , BMI Body mass index is 29.97 kg/m.  Wt Readings from Last 3 Encounters:  05/06/21 221 lb (100.2 kg)  10/04/20 214 lb (97.1 kg)  06/18/20 215 lb (97.5 kg)     GEN: Well nourished, well developed, in no acute distress. HEENT: normal. Neck: Supple, no JVD, carotid bruits, or masses. Cardiac: RRR, no murmurs, rubs, or gallops. No clubbing, cyanosis, edema.  Radials/PT 2+ and equal bilaterally.  Respiratory:  Respirations regular and unlabored, clear to auscultation  bilaterally. GI: Soft, nontender, nondistended. MS: No deformity or atrophy. Skin: Warm and dry, no rash. Neuro:  Strength and sensation are intact. Psych: Normal affect.  Assessment & Plan    Chronic systolic heart failure/s/p CRT/ischemic cardiomyopathy - Euvolemic and well compensated on exam. About 2 months  ago had worsening dyspnea which resolved with treatment of pneumonia as well as 1 week of doubled Lasix dose. No recurrent dyspnea. Echo 04/2021 stable reduced LVEF. Given dyspnea resolved, no indication for further workup such as cardiac cath. Present GDMT includes Lisinopril (previously intolerant to Entresto), Spironolactone, Coreg, Lasix. Will Rx Farxiga 10mg  QD. Samples and free 30-day coupon provided in clinic. BMP in 2 weeks for monitoring. Heart healthy diet and regular cardiovascular exercise encouraged.  Low sodium diet, fluid restriction <2L, and daily weights encouraged.   CAD s/p CABG - Stable with no anginal symptoms. No indication for ischemic evaluation.  GDMT includes aspirin, rosuvastatin, coreg. Heart healthy diet and regular cardiovascular exercise encouraged.    HLD -continue Crestor 20 mg daily.  Denies myalgias.  Previously on Repatha although noted elevated blood glucose.  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 Not on AAD therapy.  Continue to follow with EP.  Denies palpitations. Due for magnesium level, will collect in 2 weeks with his other medications.   CKD - Careful titration of diuretic and antihypertensive.  BMP in 2 weeks  DM2 -most recent A1c 6.2.  Continue to follow with primary care provider.  Dapagliflozin added for heart failure benefit, as detailed above.   Disposition: Follow up in 3 month(s) with Peter Martinique, MD or APP.  Signed, Loel Dubonnet, NP 05/06/2021, 9:17 AM [place]

## 2021-05-06 ENCOUNTER — Encounter (HOSPITAL_BASED_OUTPATIENT_CLINIC_OR_DEPARTMENT_OTHER): Payer: Self-pay

## 2021-05-06 ENCOUNTER — Ambulatory Visit (HOSPITAL_BASED_OUTPATIENT_CLINIC_OR_DEPARTMENT_OTHER): Payer: Medicare Other | Admitting: Family

## 2021-05-06 ENCOUNTER — Telehealth: Payer: Self-pay | Admitting: Cardiology

## 2021-05-06 ENCOUNTER — Encounter (HOSPITAL_BASED_OUTPATIENT_CLINIC_OR_DEPARTMENT_OTHER): Payer: Self-pay | Admitting: Family

## 2021-05-06 ENCOUNTER — Other Ambulatory Visit: Payer: Self-pay

## 2021-05-06 VITALS — BP 108/68 | HR 81 | Ht 72.0 in | Wt 221.0 lb

## 2021-05-06 DIAGNOSIS — I25118 Atherosclerotic heart disease of native coronary artery with other forms of angina pectoris: Secondary | ICD-10-CM | POA: Diagnosis not present

## 2021-05-06 DIAGNOSIS — E1122 Type 2 diabetes mellitus with diabetic chronic kidney disease: Secondary | ICD-10-CM

## 2021-05-06 DIAGNOSIS — E785 Hyperlipidemia, unspecified: Secondary | ICD-10-CM | POA: Diagnosis not present

## 2021-05-06 DIAGNOSIS — I5022 Chronic systolic (congestive) heart failure: Secondary | ICD-10-CM

## 2021-05-06 DIAGNOSIS — I255 Ischemic cardiomyopathy: Secondary | ICD-10-CM

## 2021-05-06 DIAGNOSIS — N1832 Chronic kidney disease, stage 3b: Secondary | ICD-10-CM

## 2021-05-06 MED ORDER — DAPAGLIFLOZIN PROPANEDIOL 10 MG PO TABS: 10.0000 mg | ORAL_TABLET | Freq: Every day | ORAL | 5 refills | Status: DC

## 2021-05-06 NOTE — Patient Instructions (Addendum)
Medication Instructions:  Your physician has recommended you make the following change in your medication:   START Dapagliflozin Wilder Glade) one 10mg  tablet daily  *If you need a refill on your cardiac medications before your next appointment, please call your pharmacy*   Lab Work: Your physician recommends that you return for lab work in 2 weeks for BMP, magnesium level. You do not need to be fasting.   You may come to the...   [facility] (3rd floor) [address], [place], [place] [address]  Open: 8am-Noon and 1pm-4:30pm   [place] [facility] [place]- Any location  **no appointments needed**  If you have labs (blood work) drawn today and your tests are completely normal, you will receive your results only by: [employer] (if you have MyChart) OR A paper copy in the mail If you have any lab test that is abnormal or we need to change your treatment, we will call you to review the results.   Testing/Procedures: Your EKG today showed your pacemaker was working well.    Follow-Up: At [facility], you and your health needs are our priority.  As part of our continuing mission to provide you with exceptional heart care, we have created designated Provider Care Teams.  These Care Teams include your primary Cardiologist (physician) and Advanced Practice Providers (APPs -  Physician Assistants and Nurse Practitioners) who all work together to provide you with the care you need, when you need it.  We recommend signing up for the patient portal called "MyChart".  Sign up information is provided on this After Visit Summary.  MyChart is used to connect with patients for Virtual Visits (Telemedicine).  Patients are able to view lab/test results, encounter notes, upcoming appointments, etc.  Non-urgent messages can be sent to your provider as well.   To learn more about what you can do with MyChart, go to  [email or web address].    Your next appointment:   As scheduled in April with Dr. Martinique   Other Instructions   Recommend weighing daily and keeping a log. Please call our office if you have weight gain of 2 pounds overnight or 5 pounds in 1 week. This is when you should take a whole tablet of your Lasix rather than a half tablet.   Heart Healthy Diet Recommendations: A low-salt diet is recommended. Meats should be grilled, baked, or boiled. Avoid fried foods. Focus on lean protein sources like fish or chicken with vegetables and fruits. The American Heart Association is a Microbiologist!  American Heart Association Diet and Lifeystyle Recommendations    Exercise recommendations: The American Heart Association recommends 150 minutes of moderate intensity exercise weekly. Try 30 minutes of moderate intensity exercise 4-5 times per week. This could include walking, jogging, or swimming.

## 2021-05-06 NOTE — Telephone Encounter (Signed)
Pt c/o medication issue:  1. Name of Medication: dapagliflozin propanediol (FARXIGA) 10 MG TABS tablet  metFORMIN (GLUCOPHAGE) 500 MG tablet  2. How are you currently taking this medication (dosage and times per day)? Take 1 tablet (10 mg total) by mouth daily before breakfast.    Take 500 mg by mouth 2 (two) times daily with a meal.  3. Are you having a reaction (difficulty breathing--STAT)? no  4. What is your medication issue? Patient states he was prescribe this Marcelline Deist today but calling in to see if he suppose to stop taking the metformin. Please advise

## 2021-05-06 NOTE — Telephone Encounter (Signed)
Pt should continue on his metformin for DM. Wilder Glade added today due to his HF (also indicated for DM but will not replace his metformin).

## 2021-05-06 NOTE — Telephone Encounter (Signed)
Returned the call to the patient. He stated that he is on Metformin 1000 mg bid. This has been updated. He stated that he was under the impression that Dr. Swaziland wanted him to replace the Metformin with the [facility] and not take both.

## 2021-05-08 NOTE — Telephone Encounter (Signed)
Agree [name] is added not in place of Metformin  [name] [place] MD, [facility]

## 2021-05-16 NOTE — Telephone Encounter (Signed)
Spoke to patient he understands to continue Metformin 1000 mg twice a day in addition to taking Farxiga 10 mg daily.

## 2021-05-17 ENCOUNTER — Other Ambulatory Visit: Payer: Self-pay | Admitting: Cardiology

## 2021-05-18 ENCOUNTER — Other Ambulatory Visit: Payer: Self-pay

## 2021-05-18 MED ORDER — FUROSEMIDE 20 MG PO TABS: 10.0000 mg | ORAL_TABLET | Freq: Every day | ORAL | 10 refills | Status: DC

## 2021-05-21 LAB — BASIC METABOLIC PANEL
BUN/Creatinine Ratio: 13 (ref 10–24)
BUN: 26 mg/dL (ref 8–27)
CO2: 19 mmol/L — ABNORMAL LOW (ref 20–29)
Calcium: 9.9 mg/dL (ref 8.6–10.2)
Chloride: 100 mmol/L (ref 96–106)
Creatinine, Ser: 2 mg/dL — ABNORMAL HIGH (ref 0.76–1.27)
Glucose: 185 mg/dL — ABNORMAL HIGH (ref 70–99)
Potassium: 4.2 mmol/L (ref 3.5–5.2)
Sodium: 135 mmol/L (ref 134–144)
eGFR: 36 mL/min/{1.73_m2} — ABNORMAL LOW (ref >59)

## 2021-05-21 LAB — MAGNESIUM: Magnesium: 1.9 mg/dL (ref 1.6–2.3)

## 2021-05-23 ENCOUNTER — Telehealth (HOSPITAL_BASED_OUTPATIENT_CLINIC_OR_DEPARTMENT_OTHER): Payer: Self-pay

## 2021-05-23 NOTE — Telephone Encounter (Addendum)
2nd call attempt, no answer    ----- Message from Alver Sorrow, NP sent at 05/23/2021  9:46 AM EST ----- Normal electrolytes. Kidney function decreased from previous. Recommend holding Lisinopril, Spironolactone, Farxiga for 2 days then resume at same doses. Recommend reducing Metformin to 500mg  BID. Change Lasix to PRN for weight gain 2 lbs overnight or 5 lbs in one week or edema. Repeat BMP in 7-10 days.   Slight change in kidney function common with addition of Farxiga, often we are able to make slight adjustments and continue 9-10 but if lab work remains abnormal at follow up may have to discontinue.

## 2021-05-23 NOTE — Telephone Encounter (Addendum)
Called patient, no answer, [id number]   ----- Message from Alver Sorrow, NP sent at 05/23/2021  9:46 AM EST ----- Normal electrolytes. Kidney function decreased from previous. Recommend holding Lisinopril, Spironolactone, Farxiga for 2 days then resume at same doses. Recommend reducing Metformin to 500mg  BID. Change Lasix to PRN for weight gain 2 lbs overnight or 5 lbs in one week or edema. Repeat BMP in 7-10 days.   Slight change in kidney function common with addition of Farxiga, often we are able to make slight adjustments and continue 9-10 but if lab work remains abnormal at follow up may have to discontinue.

## 2021-05-24 ENCOUNTER — Telehealth (HOSPITAL_BASED_OUTPATIENT_CLINIC_OR_DEPARTMENT_OTHER): Payer: Self-pay

## 2021-05-24 DIAGNOSIS — I25118 Atherosclerotic heart disease of native coronary artery with other forms of angina pectoris: Secondary | ICD-10-CM

## 2021-05-24 MED ORDER — FUROSEMIDE 20 MG PO TABS: 10.0000 mg | ORAL_TABLET | ORAL | 10 refills | Status: DC | PRN

## 2021-05-24 NOTE — Telephone Encounter (Addendum)
Results called to patient, labs ordered and mailed to patient. Medication list updated!    ----- Message from Alver Sorrow, NP sent at 05/23/2021  9:46 AM EST ----- Normal electrolytes. Kidney function decreased from previous. Recommend holding Lisinopril, Spironolactone, Farxiga for 2 days then resume at same doses. Recommend reducing Metformin to 500mg  BID. Change Lasix to PRN for weight gain 2 lbs overnight or 5 lbs in one week or edema. Repeat BMP in 7-10 days.   Slight change in kidney function common with addition of Farxiga, often we are able to make slight adjustments and continue 9-10 but if lab work remains abnormal at follow up may have to discontinue.

## 2021-06-03 LAB — BASIC METABOLIC PANEL
BUN/Creatinine Ratio: 13 (ref 10–24)
BUN: 23 mg/dL (ref 8–27)
CO2: 19 mmol/L — ABNORMAL LOW (ref 20–29)
Calcium: 9.5 mg/dL (ref 8.6–10.2)
Chloride: 104 mmol/L (ref 96–106)
Creatinine, Ser: 1.76 mg/dL — ABNORMAL HIGH (ref 0.76–1.27)
Glucose: 196 mg/dL — ABNORMAL HIGH (ref 70–99)
Potassium: 4.5 mmol/L (ref 3.5–5.2)
Sodium: 139 mmol/L (ref 134–144)
eGFR: 42 mL/min/{1.73_m2} — ABNORMAL LOW (ref >59)

## 2021-06-06 ENCOUNTER — Telehealth (HOSPITAL_BASED_OUTPATIENT_CLINIC_OR_DEPARTMENT_OTHER): Payer: Self-pay

## 2021-06-06 NOTE — Telephone Encounter (Addendum)
Results called to patient who verbalizes understanding!    ----- Message from Loel Dubonnet, NP sent at 06/06/2021  8:15 AM EST ----- Kidney function improving. Continue medications at current doses. Follow up as scheduled 07/12/21 with Dr. Martinique.

## 2021-06-27 ENCOUNTER — Other Ambulatory Visit: Payer: Self-pay | Admitting: Physician Assistant

## 2021-06-30 ENCOUNTER — Ambulatory Visit (INDEPENDENT_AMBULATORY_CARE_PROVIDER_SITE_OTHER): Payer: Medicare Other

## 2021-06-30 DIAGNOSIS — I5022 Chronic systolic (congestive) heart failure: Secondary | ICD-10-CM | POA: Diagnosis not present

## 2021-06-30 DIAGNOSIS — I255 Ischemic cardiomyopathy: Secondary | ICD-10-CM

## 2021-07-01 LAB — CUP PACEART REMOTE DEVICE CHECK
Battery Remaining Longevity: 20 mo
Battery Voltage: 2.92 V
Brady Statistic AP VP Percent: 18.15 %
Brady Statistic AP VS Percent: 0.01 %
Brady Statistic AS VP Percent: 81.47 %
Brady Statistic AS VS Percent: 0.36 %
Brady Statistic RA Percent Paced: 17.87 %
Brady Statistic RV Percent Paced: 97.79 %
Date Time Interrogation Session: 20230323033524
HighPow Impedance: 66 Ohm
Implantable Lead Implant Date: 20130513
Implantable Lead Implant Date: 20130513
Implantable Lead Implant Date: 20130513
Implantable Lead Location: 753858
Implantable Lead Location: 753859
Implantable Lead Location: 753860
Implantable Lead Model: 181
Implantable Lead Model: 4396
Implantable Lead Model: 5076
Implantable Lead Serial Number: 316784
Implantable Pulse Generator Implant Date: 20160304
Lead Channel Impedance Value: 399 Ohm
Lead Channel Impedance Value: 456 Ohm
Lead Channel Impedance Value: 456 Ohm
Lead Channel Impedance Value: 494 Ohm
Lead Channel Impedance Value: 494 Ohm
Lead Channel Impedance Value: 760 Ohm
Lead Channel Pacing Threshold Amplitude: 0.75 V
Lead Channel Pacing Threshold Amplitude: 1 V
Lead Channel Pacing Threshold Pulse Width: 0.4 ms
Lead Channel Pacing Threshold Pulse Width: 0.4 ms
Lead Channel Sensing Intrinsic Amplitude: 1.375 mV
Lead Channel Sensing Intrinsic Amplitude: 1.375 mV
Lead Channel Sensing Intrinsic Amplitude: 22.5 mV
Lead Channel Sensing Intrinsic Amplitude: 22.5 mV
Lead Channel Setting Pacing Amplitude: 2 V
Lead Channel Setting Pacing Amplitude: 2.5 V
Lead Channel Setting Pacing Pulse Width: 0.4 ms
Lead Channel Setting Sensing Sensitivity: 0.45 mV

## 2021-07-05 NOTE — Progress Notes (Signed)
? ?[name] ?Date of Birth: 17-Dec-1955 ?Medical Record [id number] ? ?History of Present Illness: ?[name] is seen for follow up CAD and CHF.  He has an ischemic CM and has CRT-D in place for primary prevention since April of 2013. EF by Echo was 45-50% at that time. Cardiac MRI showed an EF of 31%. Other issues include HLD (statin intolerant and intolerant to zetia), hemochromatosis, RBBB, PAF, type 2 DM and OSA. He is s/p CABG in 2006 following an MI. Cardiac cath in February 2013 showed patrent grafts. After CRT therapy he did have a significant improvement in dyspnea.  ? ?He was admitted in October 2017 with VT and received shocks. Cardiac cath demonstrated patent grafts. He was started on sotalol. In January 2018 he presented with syncope and recurrent VT. Switched to amiodarone. Was concerned about taking amiodarone long term. Underwent VT ablation by [name] on June 08, 2016. Amiodarone later discontinued.  Last device check in March showed runs of NSVT but no device therapies.  ? ?Last Echo in January 2023 showed EF 25-30%.  He is intolerant of niacin, statins (Simcor and Vytorin) and Zetia. Was seen by [name] a year ago and approved for [place]. Patient decided not to start at that time.   ? ?On a prior visit we attempted to initiate him on Entresto. Lisinopril was washed out. Lasix changed to prn. Even at lower dose he was unable to tolerate Entresto due to hypotension with pressures in the [id number] systolic and symptoms of lightheadedness and dizziness. Lisinopril resumed.  ? ?He had chest x-ray 04/06/2021 with probable bibasilar infiltrates.  He was treated with Levaquin. He had echocardiogram 04/12/2021 ordered by primary care provider which showed LVEF 25-30%, wall motion abnormalities, LV moderately dilated, grade 2 diastolic dysfunction, moderately elevated PASP, trivial MR. He was seen in Jan with increased weight and dyspnea. Farxiga added to regimen. He had been tried on Repatha before but  reports sugar increased significantly and A1c went over 9.  ? ?He is doing well today. Since visit in January he has lost 9 lbs. His breathing is doing better. No swelling. No device therapies. On Crestor once a day.  ? ? ?  ?Current Outpatient Medications  ?Medication Sig Dispense Refill  ? aspirin 81 MG tablet Take 81 mg by mouth daily.    ? carvedilol (COREG) 25 MG tablet TAKE 1 TABLET (25 MG TOTAL) BY MOUTH 2 (TWO) TIMES DAILY WITH A MEAL. 180 tablet 1  ? dapagliflozin propanediol (FARXIGA) 10 MG TABS tablet Take 1 tablet (10 mg total) by mouth daily before breakfast. 30 tablet 5  ? fenofibrate 160 MG tablet TAKE 1 TABLET BY MOUTH DAILY. 90 tablet 3  ? lisinopril (ZESTRIL) 10 MG tablet TAKE 1 TABLET (10 MG) BY MOUTH IN THE MORNING AND 1/2 TABLET (5 MG) IN THE EVENING. *INSURANCE PLAN REQUIRES 10 MG TABS TO BE COVERED* 135 tablet 3  ? magnesium oxide (MAG-OX) 400 MG tablet Take 1 tablet (400 mg total) by mouth 2 (two) times daily. Please make overdue appt with Colton. Caryl Weaver before anymore refills. Thank you 1st attempt 60 tablet 0  ? metFORMIN (GLUCOPHAGE) 500 MG tablet Take 500 mg by mouth 2 (two) times daily with a meal.    ? NITROSTAT 0.4 MG SL tablet PLACE 1 TABLET UNDER THE TONGUE EVERY 5 MINUTES AS NEEDED FOR CHEST PAIN FOR UP TO 3 DOSES. 25 tablet 3  ? ONETOUCH VERIO test strip 1 each by Other route as needed  for other.   6  ? rosuvastatin (CRESTOR) 20 MG tablet Take 1 tablet by mouth once a week.    ? spironolactone (ALDACTONE) 25 MG tablet Take 0.5 tablets (12.5 mg total) by mouth daily. 90 tablet 3  ? furosemide (LASIX) 20 MG tablet Take 0.5 tablets (10 mg total) by mouth daily. 45 tablet 10  ? ?No current facility-administered medications for this visit.  ? ? ?Allergies  ?Allergen Reactions  ? Entresto [Sacubitril-Valsartan] Other (See Comments)  ?  Hypotension, increased heart rate, dizziness, blurry vision  ? Linagliptin-Metformin Hcl   ?  Other reaction(s): chest pain, GI upset  ? Evolocumab   ?  Other  reaction(s): Elevated BS  ? Statins Other (See Comments)  ?  Myalgias ?  ? ? ?Past Medical History:  ?Diagnosis Date  ? AICD (automatic cardioverter/defibrillator) present   ? Anemia   ? Arthritis   ? "mild in my joints & knuckles" (05/10/2016)  ? CHF (congestive heart failure) ([place])   ? Coronary artery disease   ? Hemochromatosis   ? Hyperlipidemia   ? Ischemic cardiomyopathy   ? EF 36%; prior anterior MI, s/p CABG x 3; s/p cath Feb 2013 showing grafts to be patent with severe LV dysfunction  ? Myocardial infarction [facility]) 2006  ? PAF (paroxysmal atrial fibrillation) ([place])   ? Pneumonia 2000  ? RBBB (right bundle branch block)   ? Sleep apnea   ? "can't tolerate mask" (05/10/2016)  ? Type II diabetes mellitus ([place])   ? Ventricular tachycardia, sustained ([place]) 01/19/2016  ? ? ?Past Surgical History:  ?Procedure Laterality Date  ? BI-VENTRICULAR IMPLANTABLE CARDIOVERTER DEFIBRILLATOR N/A 08/21/2011  ? Procedure: BI-VENTRICULAR IMPLANTABLE CARDIOVERTER DEFIBRILLATOR  (CRT-D);  Surgeon: Colton Sprang, MD;  Location[facility] CATH LAB;  Service: Cardiovascular;  Laterality: N/A;  ? BIV ICD GENERTAOR CHANGE OUT N/A 06/12/2014  ? Procedure: BIV ICD GENERTAOR CHANGE OUT;  Surgeon: Colton Sprang, MD;  Location: [facility] CATH LAB;  Service: Cardiovascular;  Laterality: N/A;  ? CARDIAC CATHETERIZATION N/A 01/19/2016  ? Procedure: Left Heart Cath and Cors/Grafts Angiography;  Surgeon: Colton Burgett M Martinique, MD;  Location: [place] CV LAB;  Service: Cardiovascular;  Laterality: N/A;  ? CORONARY ARTERY BYPASS GRAFT  2006  ? Emergent CABG x 3 with LIMA to LAD, SVG to OM, SVG to distal RCA per Colton. Servando Weaver  ? FRACTURE SURGERY    ? INGUINAL HERNIA REPAIR  02/09/2012  ? Procedure: HERNIA REPAIR INGUINAL ADULT;  Surgeon: Colton Ober, MD;  Location: [place];  Service: General;  Laterality: Right;  ? INGUINAL HERNIA REPAIR Bilateral [phone]  ? INSERTION OF MESH  02/09/2012  ? Procedure: INSERTION OF MESH;  Surgeon: Colton Ober, MD;  Location: [place];   Service: General;  Laterality: Right;  ? LEFT HEART CATHETERIZATION WITH CORONARY/GRAFT ANGIOGRAM N/A 05/25/2011  ? Procedure: LEFT HEART CATHETERIZATION WITH Beatrix Fetters;  Surgeon: Alonia Dibuono M Martinique, MD;  Location: [facility] CATH LAB;  Service: Cardiovascular;  Laterality: N/A;  ? LIVER BIOPSY  1990s  ? SHOULDER OPEN ROTATOR CUFF REPAIR Bilateral ?2000/~ 2007  ? left; right   ? V TACH ABLATION N/A 06/08/2016  ? Procedure: V Tach Ablation;  Surgeon: Thompson Grayer, MD;  Location: [place] CV LAB;  Service: Cardiovascular;  Laterality: N/A;  ? VENTRICULAR ABLATION SURGERY  06/08/2016  ? WRIST FRACTURE SURGERY Left 06/2005  ? "plates and screws and cadavaer bone"  ? ? ?Social History  ? ?Tobacco Use  ?Smoking Status Former  ?  Packs/day: 2.00  ? Years: 30.00  ? Pack years: 60.00  ? Types: Cigarettes  ? Quit date: 03/11/2005  ? Years since quitting: 16.3  ?Smokeless Tobacco Never  ? ? ?Social History  ? ?Substance and Sexual Activity  ?Alcohol Use Yes  ? Alcohol/week: 3.0 standard drinks  ? Types: 3 Cans of beer per week  ? ? ?Family History  ?Adopted: Yes  ? ? ?Review of Systems: ?The review of systems is per the HPI.  All other systems were reviewed and are negative. ? ?Physical Exam: ?BP (!) 116/54 (BP Location: Left Arm, Patient Position: Sitting, Cuff Size: Normal)   Pulse 78   Ht 6' (1.829 m)   Wt 212 lb (96.2 kg)   BMI 28.75 kg/m?  ?GENERAL:  Well appearing WM in NAD ?HEENT:  PERRL, EOMI, sclera are clear. Oropharynx is clear. ?NECK:  No jugular venous distention, carotid upstroke brisk and symmetric, no bruits, no thyromegaly or adenopathy ?LUNGS:  Clear to auscultation bilaterally ?CHEST:  Unremarkable ?HEART:  RRR,  PMI not displaced or sustained,S1 and S2 within normal limits, no S3, no S4: no clicks, no rubs, no murmurs ?ABD:  Soft, nontender. BS +, no masses or bruits. No hepatomegaly, no splenomegaly ?EXT:  2 + pulses throughout, no edema, no cyanosis no clubbing ?SKIN:  Warm and dry.  No  rashes ?NEURO:  Alert and oriented x 3. Cranial nerves II through XII intact. ?PSYCH:  Cognitively intact ? ? ? ? ? ? ?Wt Readings from Last 3 Encounters:  ?07/12/21 212 lb (96.2 kg)  ?05/06/21 221 lb (100.2 kg)  ?10/04/20

## 2021-07-08 NOTE — Progress Notes (Signed)
Remote ICD transmission.   

## 2021-07-12 ENCOUNTER — Encounter: Payer: Self-pay | Admitting: Cardiology

## 2021-07-12 ENCOUNTER — Ambulatory Visit: Payer: Medicare Other | Admitting: Cardiology

## 2021-07-12 VITALS — BP 116/54 | HR 78 | Ht 72.0 in | Wt 212.0 lb

## 2021-07-12 DIAGNOSIS — I25118 Atherosclerotic heart disease of native coronary artery with other forms of angina pectoris: Secondary | ICD-10-CM | POA: Diagnosis not present

## 2021-07-12 DIAGNOSIS — E785 Hyperlipidemia, unspecified: Secondary | ICD-10-CM | POA: Diagnosis not present

## 2021-07-12 DIAGNOSIS — N1832 Chronic kidney disease, stage 3b: Secondary | ICD-10-CM

## 2021-07-12 DIAGNOSIS — I5022 Chronic systolic (congestive) heart failure: Secondary | ICD-10-CM | POA: Diagnosis not present

## 2021-07-12 DIAGNOSIS — Z95810 Presence of automatic (implantable) cardiac defibrillator: Secondary | ICD-10-CM

## 2021-07-12 DIAGNOSIS — I472 Ventricular tachycardia, unspecified: Secondary | ICD-10-CM | POA: Diagnosis not present

## 2021-07-12 DIAGNOSIS — E1122 Type 2 diabetes mellitus with diabetic chronic kidney disease: Secondary | ICD-10-CM

## 2021-07-12 MED ORDER — FUROSEMIDE 20 MG PO TABS
10.0000 mg | ORAL_TABLET | Freq: Every day | ORAL | 10 refills | Status: DC
Start: 2021-07-12 — End: 2022-05-22

## 2021-07-13 ENCOUNTER — Other Ambulatory Visit: Payer: Self-pay

## 2021-07-13 DIAGNOSIS — E785 Hyperlipidemia, unspecified: Secondary | ICD-10-CM

## 2021-07-13 DIAGNOSIS — I25118 Atherosclerotic heart disease of native coronary artery with other forms of angina pectoris: Secondary | ICD-10-CM

## 2021-07-13 LAB — LIPOPROTEIN A (LPA): Lipoprotein (a): 81.2 nmol/L — ABNORMAL HIGH (ref <75.0)

## 2021-07-13 LAB — BASIC METABOLIC PANEL
BUN/Creatinine Ratio: 19 (ref 10–24)
BUN: 30 mg/dL — ABNORMAL HIGH (ref 8–27)
CO2: 19 mmol/L — ABNORMAL LOW (ref 20–29)
Calcium: 9.8 mg/dL (ref 8.6–10.2)
Chloride: 103 mmol/L (ref 96–106)
Creatinine, Ser: 1.62 mg/dL — ABNORMAL HIGH (ref 0.76–1.27)
Glucose: 159 mg/dL — ABNORMAL HIGH (ref 70–99)
Potassium: 4.9 mmol/L (ref 3.5–5.2)
Sodium: 138 mmol/L (ref 134–144)
eGFR: 47 mL/min/{1.73_m2} — ABNORMAL LOW (ref >59)

## 2021-07-13 LAB — HEPATIC FUNCTION PANEL
ALT: 22 IU/L (ref 0–44)
AST: 33 IU/L (ref 0–40)
Albumin: 4.5 g/dL (ref 3.8–4.8)
Alkaline Phosphatase: 42 IU/L — ABNORMAL LOW (ref 44–121)
Bilirubin Total: 0.5 mg/dL (ref 0.0–1.2)
Bilirubin, Direct: 0.21 mg/dL (ref 0.00–0.40)
Total Protein: 6.8 g/dL (ref 6.0–8.5)

## 2021-07-13 LAB — LIPID PANEL
Chol/HDL Ratio: 4.5 ratio (ref 0.0–5.0)
Cholesterol, Total: 194 mg/dL (ref 100–199)
HDL: 43 mg/dL (ref >39)
LDL Chol Calc (NIH): 124 mg/dL — ABNORMAL HIGH (ref 0–99)
Triglycerides: 152 mg/dL — ABNORMAL HIGH (ref 0–149)
VLDL Cholesterol Cal: 27 mg/dL (ref 5–40)

## 2021-07-15 ENCOUNTER — Other Ambulatory Visit: Payer: Self-pay

## 2021-07-15 ENCOUNTER — Other Ambulatory Visit: Payer: Self-pay | Admitting: Cardiology

## 2021-07-19 ENCOUNTER — Other Ambulatory Visit: Payer: Self-pay

## 2021-07-19 DIAGNOSIS — E7849 Other hyperlipidemia: Secondary | ICD-10-CM

## 2021-07-19 DIAGNOSIS — I25118 Atherosclerotic heart disease of native coronary artery with other forms of angina pectoris: Secondary | ICD-10-CM

## 2021-07-19 NOTE — Progress Notes (Signed)
B

## 2021-07-27 ENCOUNTER — Ambulatory Visit: Payer: Medicare Other

## 2021-08-04 ENCOUNTER — Ambulatory Visit: Payer: Medicare Other | Admitting: Pharmacist Clinician (PhC)/ Clinical Pharmacy Specialist

## 2021-08-04 ENCOUNTER — Encounter: Payer: Self-pay | Admitting: Pharmacist Clinician (PhC)/ Clinical Pharmacy Specialist

## 2021-08-04 VITALS — BP 106/70 | HR 67 | Resp 17 | Ht 72.0 in | Wt 214.4 lb

## 2021-08-04 DIAGNOSIS — Z1389 Encounter for screening for other disorder: Secondary | ICD-10-CM | POA: Diagnosis not present

## 2021-08-04 DIAGNOSIS — E7849 Other hyperlipidemia: Secondary | ICD-10-CM | POA: Diagnosis not present

## 2021-08-04 MED ORDER — NEXLETOL 180 MG PO TABS: 180.0000 mg | ORAL_TABLET | Freq: Every day | ORAL | 0 refills | Status: DC

## 2021-08-04 NOTE — Patient Instructions (Addendum)
Your Results: ?           ? Your most recent labs Goal  ?Total Cholesterol 194 < 200  ?Triglycerides 152 < 150  ?HDL (happy/good cholesterol) 43 > 40  ?LDL (lousy/bad cholesterol 124 < 55  ? ?Medication changes: ? Start Nexletol 180 mg once daily.  At the end of the 3 weeks, please get the blood work.  I will give you a call once we get those results and we can determine if this is going to work for you.  If you need to stop the medication before that time, please give me a call.  (Micheal Murad/Chris at [phone]) ? ?Patient Assistance:  The [employer] foundation offers assistance to help pay for medication copays.  They will cover copays for all cholesterol lowering meds, including statins, fibrates, omega-3 oils, ezetimibe, Repatha, Praluent, Nexletol, Nexlizet.  The cards are usually good for $2,500 or 12 months, whichever comes first. ?Go to healthwellfoundation.org ?Click on ?Apply Now? ?Answer questions as to whom is applying (patient or representative) ?Your disease fund will be ?hypercholesterolemia - Medicare access? ?They will ask questions about finances and which medications you are taking for cholesterol ?When you submit, the approval is usually within minutes.  You will need to print the card information from the site ?You will need to show this information to your pharmacy, they will bill your Medicare Part D plan first -then bill [employer] --for the copay.   ?You can also call them at [phone], although the hold times can be quite long.  ? ?Thank you for choosing [facility]  ? ?

## 2021-08-04 NOTE — Assessment & Plan Note (Signed)
Patient with CAD and LDL cholesterol not at goal on maximally tolerated statin (20 mg rosuvastatin once weekly).  Reviewed remaining options for lowering LDL cholesterol, including bempedoic acid and inclisiran.  Discussed mechanisms of action, dosing, side effects and potential decreases in LDL cholesterol.  Also reviewed cost information and potential options for patient assistance.  Answered all patient questions.  Based on this information, patient would prefer to start bempedoic acid.   He was given 3 weeks of samples and asked that he check BMET and uric acid level about the time he finishes with these.  If he tolerates this and his labs are WNL, we will then submit PA to his insurance.   ? ?

## 2021-08-04 NOTE — Progress Notes (Signed)
08/04/2021 ?[name] ?March 09, 1956 ?[id number] ? ? ?HPI:  [name] is a 66 y.o. male patient of Dr Martinique, who presents today for a lipid clinic evaluation.  See pertinent past medical history below.   He has an extensive history of CAD, but has not been able to tolerate multiple lipid lowering medications.  He developed myalgias with multiple statins as well as ezetimibe, and after trying Repatha he noted A1c jumped up to 9.   He is in the office today to discuss further cholesterol lowering options. ? ?Past Medical History: ?CAD 2017 cath showed 85% stenosis of prox to mid LAD, 100% stenosis of prox to mid CX and ost to distal RCA; bypasses (2006 - LIMA to LAD, SVG to OM2 and SVG to PDA all patent in 2017)  ?CHF 1/23 EF by echo at 25-30%  ?DM2 1/23 A1c 6.2 on Farxiga, metformin  ? ? ?Current Medications: rosuvastatin 20 mg weekly ? ?Cholesterol Goals: LDL < 55 ?  ?Intolerant/previously tried: atorvastatin, rosuvastatin, simvastatin, ezetimibe - all caused myalgias, evolocumab - increase in blood sugars ? ?Family history: adopted, 1 child no cholesterol only has migraines ? ?Diet: eats out 2-3 times per week otherwise home cooked; summer - has one acre garden then blanches and freezes many of the vegetables ? ?Exercise:  splitting wood, garden work  ? ?Labs: 4/23: TC 194, TG 152, HDL 43, LDL 124 (on rosuvastatin once weekly) ? ? ?Current Outpatient Medications  ?Medication Sig Dispense Refill  ? aspirin 81 MG tablet Take 81 mg by mouth daily.    ? Bempedoic Acid (NEXLETOL) 180 MG TABS Take 180 mg by mouth daily. 21 tablet 0  ? carvedilol (COREG) 25 MG tablet TAKE 1 TABLET (25 MG TOTAL) BY MOUTH 2 (TWO) TIMES DAILY WITH A MEAL. 180 tablet 1  ? dapagliflozin propanediol (FARXIGA) 10 MG TABS tablet Take 1 tablet (10 mg total) by mouth daily before breakfast. 30 tablet 5  ? fenofibrate 160 MG tablet TAKE 1 TABLET BY MOUTH DAILY. 90 tablet 3  ? furosemide (LASIX) 20 MG tablet Take 0.5 tablets (10 mg total)  by mouth daily. 45 tablet 10  ? lisinopril (ZESTRIL) 10 MG tablet TAKE 1 TABLET (10 MG) BY MOUTH IN THE MORNING AND 1/2 TABLET (5 MG) IN THE EVENING. *INSURANCE PLAN REQUIRES 10 MG TABS TO BE COVERED* 135 tablet 3  ? magnesium oxide (MAG-OX) 400 MG tablet Take 1 tablet (400 mg total) by mouth 2 (two) times daily. Please make overdue appt with Dr. Caryl Comes before anymore refills. Thank you 1st attempt 60 tablet 0  ? metFORMIN (GLUCOPHAGE) 500 MG tablet Take 500 mg by mouth 2 (two) times daily with a meal.    ? NITROSTAT 0.4 MG SL tablet PLACE 1 TABLET UNDER THE TONGUE EVERY 5 MINUTES AS NEEDED FOR CHEST PAIN FOR UP TO 3 DOSES. 25 tablet 3  ? ONETOUCH VERIO test strip 1 each by Other route as needed for other.   6  ? rosuvastatin (CRESTOR) 20 MG tablet Take 1 tablet by mouth once a week.    ? spironolactone (ALDACTONE) 25 MG tablet Take 0.5 tablets (12.5 mg total) by mouth daily. 90 tablet 3  ? ?No current facility-administered medications for this visit.  ? ? ?Allergies  ?Allergen Reactions  ? Entresto [Sacubitril-Valsartan] Other (See Comments)  ?  Hypotension, increased heart rate, dizziness, blurry vision  ? Linagliptin-Metformin Hcl   ?  Other reaction(s): chest pain, GI upset  ? Amphotericin B Lipid Complex [  Amphotericin B]   ?  MYALGIAS  ? Vytorin [Ezetimibe-Simvastatin]   ?  MYALGIAS  ? Evolocumab   ?  Other reaction(s): Elevated BS  ? Statins Other (See Comments)  ?  Myalgias ?  ? ? ?Past Medical History:  ?Diagnosis Date  ? AICD (automatic cardioverter/defibrillator) present   ? Anemia   ? Arthritis   ? "mild in my joints & knuckles" (05/10/2016)  ? CHF (congestive heart failure) ([place])   ? Coronary artery disease   ? Hemochromatosis   ? Hyperlipidemia   ? Ischemic cardiomyopathy   ? EF 36%; prior anterior MI, s/p CABG x 3; s/p cath Feb 2013 showing grafts to be patent with severe LV dysfunction  ? Myocardial infarction [facility]) 2006  ? PAF (paroxysmal atrial fibrillation) ([place])   ? Pneumonia 2000  ? RBBB (right  bundle branch block)   ? Sleep apnea   ? "can't tolerate mask" (05/10/2016)  ? Type II diabetes mellitus ([place])   ? Ventricular tachycardia, sustained ([place]) 01/19/2016  ? ? ?Blood pressure 106/70, pulse 67, resp. rate 17, height 6' (1.829 m), weight 214 lb 6.4 oz (97.3 kg), SpO2 96 %. ? ? ?Hyperlipidemia ?Patient with CAD and LDL cholesterol not at goal on maximally tolerated statin (20 mg rosuvastatin once weekly).  Reviewed remaining options for lowering LDL cholesterol, including bempedoic acid and inclisiran.  Discussed mechanisms of action, dosing, side effects and potential decreases in LDL cholesterol.  Also reviewed cost information and potential options for patient assistance.  Answered all patient questions.  Based on this information, patient would prefer to start bempedoic acid.   He was given 3 weeks of samples and asked that he check BMET and uric acid level about the time he finishes with these.  If he tolerates this and his labs are WNL, we will then submit PA to his insurance.   ? ? ?Tommy Medal PharmD CPP [facility] ?[address] ?[place], [place] [address] ?[phone] ? ? ? ?

## 2021-08-30 LAB — BASIC METABOLIC PANEL
BUN/Creatinine Ratio: 16 (ref 10–24)
BUN: 28 mg/dL — ABNORMAL HIGH (ref 8–27)
CO2: 20 mmol/L (ref 20–29)
Calcium: 10 mg/dL (ref 8.6–10.2)
Chloride: 103 mmol/L (ref 96–106)
Creatinine, Ser: 1.72 mg/dL — ABNORMAL HIGH (ref 0.76–1.27)
Glucose: 127 mg/dL — ABNORMAL HIGH (ref 70–99)
Potassium: 4.8 mmol/L (ref 3.5–5.2)
Sodium: 138 mmol/L (ref 134–144)
eGFR: 44 mL/min/{1.73_m2} — ABNORMAL LOW (ref >59)

## 2021-08-30 LAB — URIC ACID: Uric Acid: 7.6 mg/dL (ref 3.8–8.4)

## 2021-09-02 ENCOUNTER — Telehealth: Payer: Self-pay | Admitting: *Deleted

## 2021-09-02 NOTE — Telephone Encounter (Signed)
-----   Message from Peter M Martinique, MD sent at 08/31/2021  7:33 AM EDT ----- All values are normal or within acceptable limits.   Medication changes / Follow up labs / Other changes or recommendations:   Renal function is unchanged. Uric acid level is normal  Peter Martinique, MD 08/31/2021 7:33 AM

## 2021-09-02 NOTE — Telephone Encounter (Signed)
The patient has been notified of the result and verbalized understanding.  All questions (if any) were answered. Tobin Chad, RN 09/02/2021 10:54 AM    Patient states he called the  foundation number for assist with the cost of Nexletol  The fund is not available. RN informed patient will notify [facility] pharmacist . RN also informed patient if he does not hear from someone by the end of next week . Patient voiced understanding

## 2021-09-06 NOTE — Telephone Encounter (Signed)
Patient tolerating Nexletol without issue, uric acid WNL.  Will do PA to get covered by insurance and send prescription to local pharmacy.  Patient aware [facility] currently closed.  He is on list to call if we determine it is open again.  He will check affordability and let us know if cost prohibitive or not.

## 2021-09-08 NOTE — Telephone Encounter (Signed)
RYELAN KAZEE ([employer] - [id number] Nexletol 180MG  tablets Status: PA Request Created: June 1st, 2023 Sent: June 1st, 2023

## 2021-09-08 NOTE — Telephone Encounter (Signed)
Routing to dr. Johnanna Schneiders as the pt was approved and looks like rx was sent in April. I would like to make sure that they do not need anything else.

## 2021-09-12 MED ORDER — NEXLETOL 180 MG PO TABS
180.0000 mg | ORAL_TABLET | Freq: Every day | ORAL | 3 refills | Status: DC
Start: 2021-09-12 — End: 2021-11-11

## 2021-09-12 NOTE — Telephone Encounter (Signed)
RX sent to pharmacy  

## 2021-09-12 NOTE — Addendum Note (Signed)
Addended by: Rosalee Kaufman on: 09/12/2021 01:47 PM   Modules accepted: Orders

## 2021-09-29 ENCOUNTER — Ambulatory Visit (INDEPENDENT_AMBULATORY_CARE_PROVIDER_SITE_OTHER): Payer: Medicare Other

## 2021-09-29 DIAGNOSIS — I255 Ischemic cardiomyopathy: Secondary | ICD-10-CM | POA: Diagnosis not present

## 2021-10-03 ENCOUNTER — Telehealth: Payer: Self-pay

## 2021-10-03 LAB — CUP PACEART REMOTE DEVICE CHECK
Battery Remaining Longevity: 19 mo
Battery Voltage: 2.9 V
Brady Statistic AP VP Percent: 23.9 %
Brady Statistic AP VS Percent: 0.01 %
Brady Statistic AS VP Percent: 75.9 %
Brady Statistic AS VS Percent: 0.2 %
Brady Statistic RA Percent Paced: 23.57 %
Brady Statistic RV Percent Paced: 97.21 %
Date Time Interrogation Session: 20230623135958
HighPow Impedance: 71 Ohm
Implantable Lead Implant Date: 20130513
Implantable Lead Implant Date: 20130513
Implantable Lead Implant Date: 20130513
Implantable Lead Location: 753858
Implantable Lead Location: 753859
Implantable Lead Location: 753860
Implantable Lead Model: 181
Implantable Lead Model: 4396
Implantable Lead Model: 5076
Implantable Lead Serial Number: 316784
Implantable Pulse Generator Implant Date: 20160304
Lead Channel Impedance Value: 437 Ohm
Lead Channel Impedance Value: 456 Ohm
Lead Channel Impedance Value: 494 Ohm
Lead Channel Impedance Value: 494 Ohm
Lead Channel Impedance Value: 570 Ohm
Lead Channel Impedance Value: 836 Ohm
Lead Channel Pacing Threshold Amplitude: 0.75 V
Lead Channel Pacing Threshold Amplitude: 1 V
Lead Channel Pacing Threshold Pulse Width: 0.4 ms
Lead Channel Pacing Threshold Pulse Width: 0.4 ms
Lead Channel Sensing Intrinsic Amplitude: 1.25 mV
Lead Channel Sensing Intrinsic Amplitude: 1.25 mV
Lead Channel Sensing Intrinsic Amplitude: 11.25 mV
Lead Channel Sensing Intrinsic Amplitude: 11.25 mV
Lead Channel Setting Pacing Amplitude: 2 V
Lead Channel Setting Pacing Amplitude: 2.5 V
Lead Channel Setting Pacing Pulse Width: 0.4 ms
Lead Channel Setting Sensing Sensitivity: 0.45 mV

## 2021-10-03 NOTE — Telephone Encounter (Signed)
Pt called returning nurse call. I told him the nurse will give him a call back in a few minutes.

## 2021-10-03 NOTE — Telephone Encounter (Signed)
Successful telephone encounter to patient to assess for s/s of VT with ATP. Patient states "it happens all the time". Patient continues to take all medications including coreg as prescribed. Advised of [place] law and patient states "I am aware but I am going to keep driving". Shock plan reviewed. Routing to Dr. Graciela Husbands for advisement.

## 2021-10-07 NOTE — Progress Notes (Signed)
Remote ICD transmission.   

## 2021-11-07 NOTE — Telephone Encounter (Signed)
Can we schedule him to see GT about possible VT ablation

## 2021-11-08 NOTE — Telephone Encounter (Signed)
Spoke with pt and advised of Dr Odessa Fleming recommendation to see Dr Ladona Ridgel to discuss possible VT ablation.  Pt verbalizes understanding and states he would like to discuss with his wife before scheduling.  Provided Dr Odessa Fleming scheduler's direct phone number of [phone].  He states he will contact scheduler tomorrow. 11/09/2021.  Pt advised RN will be out of the office tomorrow.  Pt verbalizes understanding and agrees with current plan.

## 2021-11-11 ENCOUNTER — Encounter: Payer: Self-pay | Admitting: Internal Medicine

## 2021-11-11 ENCOUNTER — Ambulatory Visit: Payer: Medicare Other | Admitting: Internal Medicine

## 2021-11-11 VITALS — BP 137/73 | HR 67 | Ht 72.0 in | Wt 210.0 lb

## 2021-11-11 DIAGNOSIS — I472 Ventricular tachycardia, unspecified: Secondary | ICD-10-CM

## 2021-11-11 DIAGNOSIS — Z95810 Presence of automatic (implantable) cardiac defibrillator: Secondary | ICD-10-CM | POA: Diagnosis not present

## 2021-11-11 DIAGNOSIS — I255 Ischemic cardiomyopathy: Secondary | ICD-10-CM | POA: Diagnosis not present

## 2021-11-11 NOTE — Progress Notes (Signed)
HPI [name] returns today for followup. He is referred by Dr. Crissie Sickles to consider VT ablation. He has a long h/o CAD and chronic systolic heart failure s/p biv ICD insertion. The patient has his LV lead turned off. He has class 2 CHF. He has had 2 episodes of sustained VT over the last year. He has had multiple episodes of NSVT which he feels somewhat. He has not been shocked. He denies syncope. He does note anxiety. Also he has pain in his hips with walking which limits him as much as dyspnea with exertion.  Allergies  Allergen Reactions   Entresto [Sacubitril-Valsartan] Other (See Comments)    Hypotension, increased heart rate, dizziness, blurry vision   Linagliptin-Metformin Hcl     Other reaction(s): chest pain, GI upset   Amphotericin B Lipid Complex [Amphotericin B]     MYALGIAS   Vytorin [Ezetimibe-Simvastatin]     MYALGIAS   Evolocumab     Other reaction(s): Elevated BS   Statins Other (See Comments)    Myalgias      Current Outpatient Medications  Medication Sig Dispense Refill   aspirin 81 MG tablet Take 81 mg by mouth daily.     carvedilol (COREG) 25 MG tablet TAKE 1 TABLET (25 MG TOTAL) BY MOUTH 2 (TWO) TIMES DAILY WITH A MEAL. 180 tablet 1   dapagliflozin propanediol (FARXIGA) 10 MG TABS tablet Take 1 tablet (10 mg total) by mouth daily before breakfast. 30 tablet 5   fenofibrate 160 MG tablet TAKE 1 TABLET BY MOUTH DAILY. 90 tablet 3   furosemide (LASIX) 20 MG tablet Take 0.5 tablets (10 mg total) by mouth daily. 45 tablet 10   lisinopril (ZESTRIL) 10 MG tablet TAKE 1 TABLET (10 MG) BY MOUTH IN THE MORNING AND 1/2 TABLET (5 MG) IN THE EVENING. *INSURANCE PLAN REQUIRES 10 MG TABS TO BE COVERED* 135 tablet 3   magnesium oxide (MAG-OX) 400 MG tablet Take 1 tablet (400 mg total) by mouth 2 (two) times daily. Please make overdue appt with Dr. Graciela Husbands before anymore refills. Thank you 1st attempt 60 tablet 0   metFORMIN (GLUCOPHAGE) 500 MG tablet Take 500 mg by mouth 2  (two) times daily with a meal.     NITROSTAT 0.4 MG SL tablet PLACE 1 TABLET UNDER THE TONGUE EVERY 5 MINUTES AS NEEDED FOR CHEST PAIN FOR UP TO 3 DOSES. 25 tablet 3   ONETOUCH VERIO test strip 1 each by Other route as needed for other.   6   rosuvastatin (CRESTOR) 20 MG tablet Take 1 tablet by mouth once a week.     spironolactone (ALDACTONE) 25 MG tablet Take 0.5 tablets (12.5 mg total) by mouth daily. 90 tablet 3   No current facility-administered medications for this visit.     Past Medical History:  Diagnosis Date   AICD (automatic cardioverter/defibrillator) present    Anemia    Arthritis    "mild in my joints & knuckles" (05/10/2016)   CHF (congestive heart failure) (HCC)    Coronary artery disease    Hemochromatosis    Hyperlipidemia    Ischemic cardiomyopathy    EF 36%; prior anterior MI, s/p CABG x 3; s/p cath Feb 2013 showing grafts to be patent with severe LV dysfunction   Myocardial infarction (HCC) 2006   PAF (paroxysmal atrial fibrillation) (HCC)    Pneumonia 2000   RBBB (right bundle branch block)    Sleep apnea    "can't tolerate mask" (05/10/2016)  Type II diabetes mellitus (HCC)    Ventricular tachycardia, 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    ROS:   All systems reviewed and negative except as noted in the HPI.   Past Surgical History:  Procedure Laterality Date   BI-VENTRICULAR IMPLANTABLE CARDIOVERTER DEFIBRILLATOR N/A 08/21/2011   Procedure: BI-VENTRICULAR IMPLANTABLE CARDIOVERTER DEFIBRILLATOR  (CRT-D);  Surgeon: Duke Salvia, MD;  Location: [facility] CATH LAB;  Service: Cardiovascular;  Laterality: N/A;   BIV ICD GENERTAOR CHANGE OUT N/A 06/12/2014   Procedure: BIV ICD GENERTAOR CHANGE OUT;  Surgeon: Duke Salvia, MD;  Location: [facility] CATH LAB;  Service: Cardiovascular;  Laterality: N/A;   CARDIAC CATHETERIZATION N/A 01/19/2016   Procedure: Left Heart Cath and Cors/Grafts Angiography;  Surgeon: Peter M Swaziland, MD;  Location: [facility] INVASIVE CV LAB;  Service:  Cardiovascular;  Laterality: N/A;   CORONARY ARTERY BYPASS GRAFT  2006   Emergent CABG x 3 with LIMA to LAD, SVG to OM, SVG to distal RCA per Dr. Tyrone Sage   FRACTURE SURGERY     INGUINAL HERNIA REPAIR  02/09/2012   Procedure: HERNIA REPAIR INGUINAL ADULT;  Surgeon: Cherylynn Ridges, MD;  Location: [facility] OR;  Service: General;  Laterality: Right;   INGUINAL HERNIA REPAIR Bilateral [phone]   INSERTION OF MESH  02/09/2012   Procedure: INSERTION OF MESH;  Surgeon: Cherylynn Ridges, MD;  Location: [facility] OR;  Service: General;  Laterality: Right;   LEFT HEART CATHETERIZATION WITH CORONARY/GRAFT ANGIOGRAM N/A 05/25/2011   Procedure: LEFT HEART CATHETERIZATION WITH Isabel Caprice;  Surgeon: Peter M Swaziland, MD;  Location: [facility] CATH LAB;  Service: Cardiovascular;  Laterality: N/A;   LIVER BIOPSY  1990s   SHOULDER OPEN ROTATOR CUFF REPAIR Bilateral ?2000/~ 2007   left; right    V TACH ABLATION N/A 06/08/2016   Procedure: V Tach Ablation;  Surgeon: Hillis Range, MD;  Location: [facility] INVASIVE CV LAB;  Service: Cardiovascular;  Laterality: N/A;   VENTRICULAR ABLATION SURGERY  06/08/2016   WRIST FRACTURE SURGERY Left 06/2005   "plates and screws and cadavaer bone"     Family History  Adopted: Yes     Social History   Socioeconomic History   Marital status: Married    Spouse name: Not on file   Number of children: 1   Years of education: Not on file   Highest education level: Not on file  Occupational History   Occupation: telephone service  Tobacco Use   Smoking status: Former    Packs/day: 2.00    Years: 30.00    Total pack years: 60.00    Types: Cigarettes    Quit date: 03/11/2005    Years since quitting: 16.6   Smokeless tobacco: Never  Vaping Use   Vaping Use: Never used  Substance and Sexual Activity   Alcohol use: Yes    Alcohol/week: 3.0 standard drinks of alcohol    Types: 3 Cans of beer per week   Drug use: No   Sexual activity: Not Currently  Other Topics Concern   Not on file   Social History Narrative   Not on file   Social Determinants of Health   Financial Resource Strain: Not on file  Food Insecurity: Not on file  Transportation Needs: Not on file  Physical Activity: Not on file  Stress: Not on file  Social Connections: Not on file  Intimate Partner Violence: Not on file     BP 137/73   Pulse 67   Ht 6' (1.829 m)   Wt 210 lb (95.3  kg)   BMI 28.48 kg/m   Physical Exam:  Well appearing NAD HEENT: Unremarkable Neck:  No JVD, no thyromegally Lymphatics:  No adenopathy Back:  No CVA tenderness Lungs:  Clear with no wheezes HEART:  Regular rate rhythm, no murmurs, no rubs, no clicks Abd:  soft, positive bowel sounds, no organomegally, no rebound, no guarding Ext:  2 plus pulses, no edema, no cyanosis, no clubbing Skin:  No rashes no nodules Neuro:  CN II through XII intact, motor grossly intact  EKG - NSR with pacing induced LBBB with QRS of 180 ms.  DEVICE  Normal device function.  See PaceArt for details.   Assess/Plan:  Recurrent VT - I discussed the treatment options. As he has only had 2 sustained episodes requiring ATP alone in the last year, and was difficult to induce with his last ablation, and likely hemodynamically unstable in VT, I would recommend watchful waiting. If he has more sustained VT and therapies we could reconsider a scar modification, with all of the inherent limits. For now I do not think that the risk/benefit favors ablation, but rather watchful waiting. Chronic systolic heart failure - he has class 2 symptoms. I'll discuss the LV lead with Dr. Crissie Sickles. CAD, s/p CABG - he is s/p MI but denies any anginal symptoms Hip pain - I wonder if he has claudication in the iliac's. Probably just arthritis but will check ABI's.   Sharlot Gowda Laloni Rowton,MD

## 2021-11-11 NOTE — Patient Instructions (Addendum)
Medication Instructions:  Your physician recommends that you continue on your current medications as directed. Please refer to the Current Medication list given to you today.  *If you need a refill on your cardiac medications before your next appointment, please call your pharmacy*  Lab Work: None ordered.  If you have labs (blood work) drawn today and your tests are completely normal, you will receive your results only by: MyChart Message (if you have MyChart) OR A paper copy in the mail If you have any lab test that is abnormal or we need to change your treatment, we will call you to review the results.  Testing/Procedures: We will contact you to schedule a Vascular Lab Study.  Follow-Up:  Your next appointment:   AS NEEDED   The format for your next appointment:   In Person  Provider:   [name], MD{or one of the following Advanced Practice Providers on your designated Care Team:   Francis Dowse, [place] Casimiro Needle "Mardelle Matte" Lanna Poche, [place]  Remote monitoring is used to monitor your ICD from home. This monitoring reduces the number of office visits required to check your device to one time per year. It allows [place] to keep an eye on the functioning of your device to ensure it is working properly. You are scheduled for a device check from home on 12/29/21. You may send your transmission at any time that day. If you have a wireless device, the transmission will be sent automatically. After your physician reviews your transmission, you will receive a postcard with your next transmission date.  Important Information About Sugar

## 2021-11-13 NOTE — Progress Notes (Signed)
[name] Date of Birth: 05-06-1955 Medical Record #[id number]  History of Present Illness: [name] is seen for follow up CAD and CHF.  He has an ischemic CM and has CRT-D in place for primary prevention since April of 2013. EF by Echo was 45-50% at that time. Cardiac MRI showed an EF of 31%. Other issues include HLD (statin intolerant and intolerant to zetia), hemochromatosis, RBBB, PAF, type 2 DM and OSA. He is s/p CABG in 2006 following an MI. Cardiac cath in February 2013 showed patrent grafts. After CRT therapy he did have a significant improvement in dyspnea.   He was admitted in October 2017 with VT and received shocks. Cardiac cath demonstrated patent grafts. He was started on sotalol. In January 2018 he presented with syncope and recurrent VT. Switched to amiodarone. Was concerned about taking amiodarone long term. Underwent VT ablation by Dr. Johney Frame on June 08, 2016. Amiodarone later discontinued.  Last device check in March showed runs of NSVT but no device therapies.   Last Echo in January 2023 showed EF 25-30%.  He is intolerant of niacin, statins (Simcor and Vytorin) and Zetia. Was seen by Dr Rennis Golden a year ago and approved for Repatha. Patient decided not to start at that time.    On a prior visit we attempted to initiate him on Entresto. Lisinopril was washed out. Lasix changed to prn. Even at lower dose he was unable to tolerate Entresto due to hypotension with pressures in the 80s systolic and symptoms of lightheadedness and dizziness. Lisinopril resumed.   He had chest x-ray 04/06/2021 with probable bibasilar infiltrates.  He was treated with Levaquin. He had echocardiogram 04/12/2021 ordered by primary care provider which showed LVEF 25-30%, wall motion abnormalities, LV moderately dilated, grade 2 diastolic dysfunction, moderately elevated PASP, trivial MR. He was seen in Jan with increased weight and dyspnea. Farxiga added to regimen. He had been tried on Repatha before but  reports sugar increased significantly and A1c went over 9. Repatha was stopped.  He was seen on August 8 by Dr Ladona Ridgel for consideration of VT ablation. He has had 2 episodes of sustained VT over the last year. He has had multiple episodes of NSVT which he feels somewhat. Dr Ladona Ridgel felt it was best to continue to monitor given risk of procedure and difficulty inducing VT on prior EP study.   He is doing well today.  He is very active gardening. Denies any increase SOB, chest pain or palpitations. Notes bilateral hip pain with exertion with burning. Scheduled for LE arterial dopplers later this month. Awaiting possible funding for Nexlitol from [employer]. Unable to afford otherwise.      Current Outpatient Medications  Medication Sig Dispense Refill   aspirin 81 MG tablet Take 81 mg by mouth daily.     carvedilol (COREG) 25 MG tablet TAKE 1 TABLET (25 MG TOTAL) BY MOUTH 2 (TWO) TIMES DAILY WITH A MEAL. 180 tablet 1   dapagliflozin propanediol (FARXIGA) 10 MG TABS tablet Take 1 tablet (10 mg total) by mouth daily before breakfast. 30 tablet 5   fenofibrate 160 MG tablet TAKE 1 TABLET BY MOUTH DAILY. 90 tablet 3   furosemide (LASIX) 20 MG tablet Take 0.5 tablets (10 mg total) by mouth daily. 45 tablet 10   lisinopril (ZESTRIL) 10 MG tablet TAKE 1 TABLET (10 MG) BY MOUTH IN THE MORNING AND 1/2 TABLET (5 MG) IN THE EVENING. *INSURANCE PLAN REQUIRES 10 MG TABS TO BE COVERED* 135 tablet 3  magnesium oxide (MAG-OX) 400 MG tablet Take 1 tablet (400 mg total) by mouth 2 (two) times daily. Please make overdue appt with Dr. Caryl Comes before anymore refills. Thank you 1st attempt 60 tablet 0   metFORMIN (GLUCOPHAGE) 500 MG tablet Take 500 mg by mouth 2 (two) times daily with a meal.     NITROSTAT 0.4 MG SL tablet PLACE 1 TABLET UNDER THE TONGUE EVERY 5 MINUTES AS NEEDED FOR CHEST PAIN FOR UP TO 3 DOSES. 25 tablet 3   ONETOUCH VERIO test strip 1 each by Other route as needed for other.   6    rosuvastatin (CRESTOR) 20 MG tablet Take 1 tablet by mouth once a week.     spironolactone (ALDACTONE) 25 MG tablet Take 0.5 tablets (12.5 mg total) by mouth daily. 90 tablet 3   No current facility-administered medications for this visit.    Allergies  Allergen Reactions   Entresto [Sacubitril-Valsartan] Other (See Comments)    Hypotension, increased heart rate, dizziness, blurry vision   Linagliptin-Metformin Hcl     Other reaction(s): chest pain, GI upset   Amphotericin B Lipid Complex [Amphotericin B]     MYALGIAS   Vytorin [Ezetimibe-Simvastatin]     MYALGIAS   Evolocumab     Other reaction(s): Elevated BS   Statins Other (See Comments)    Myalgias     Past Medical History:  Diagnosis Date   AICD (automatic cardioverter/defibrillator) present    Anemia    Arthritis    "mild in my joints & knuckles" (05/10/2016)   CHF (congestive heart failure) (HCC)    Coronary artery disease    Hemochromatosis    Hyperlipidemia    Ischemic cardiomyopathy    EF 36%; prior anterior MI, s/p CABG x 3; s/p cath Feb 2013 showing grafts to be patent with severe LV dysfunction   Myocardial infarction ([place]) 2006   PAF (paroxysmal atrial fibrillation) ([place])    Pneumonia 2000   RBBB (right bundle branch block)    Sleep apnea    "can't tolerate mask" (05/10/2016)   Type II diabetes mellitus (HCC)    Ventricular tachycardia, sustained ([place]) 01/19/2016    Past Surgical History:  Procedure Laterality Date   BI-VENTRICULAR IMPLANTABLE CARDIOVERTER DEFIBRILLATOR N/A 08/21/2011   Procedure: BI-VENTRICULAR IMPLANTABLE CARDIOVERTER DEFIBRILLATOR  (CRT-D);  Surgeon: Deboraha Sprang, MD;  Location: [facility] CATH LAB;  Service: Cardiovascular;  Laterality: N/A;   BIV ICD GENERTAOR CHANGE OUT N/A 06/12/2014   Procedure: BIV ICD GENERTAOR CHANGE OUT;  Surgeon: Deboraha Sprang, MD;  Location: [facility] CATH LAB;  Service: Cardiovascular;  Laterality: N/A;   CARDIAC CATHETERIZATION N/A 01/19/2016   Procedure: Left Heart  Cath and Cors/Grafts Angiography;  Surgeon: Kaisey Huseby M Martinique, MD;  Location: [place] CV LAB;  Service: Cardiovascular;  Laterality: N/A;   CORONARY ARTERY BYPASS GRAFT  2006   Emergent CABG x 3 with LIMA to LAD, SVG to OM, SVG to distal RCA per Dr. Servando Snare   FRACTURE SURGERY     INGUINAL HERNIA REPAIR  02/09/2012   Procedure: HERNIA REPAIR INGUINAL ADULT;  Surgeon: Gwenyth Ober, MD;  Location: [place];  Service: General;  Laterality: Right;   INGUINAL HERNIA REPAIR Bilateral [phone]   INSERTION OF MESH  02/09/2012   Procedure: INSERTION OF MESH;  Surgeon: Gwenyth Ober, MD;  Location: [place];  Service: General;  Laterality: Right;   LEFT HEART CATHETERIZATION WITH CORONARY/GRAFT ANGIOGRAM N/A 05/25/2011   Procedure: LEFT HEART CATHETERIZATION WITH CORONARY/GRAFT ANGIOGRAM;  Surgeon:  Talana Slatten M Swaziland, MD;  Location: [facility] CATH LAB;  Service: Cardiovascular;  Laterality: N/A;   LIVER BIOPSY  1990s   SHOULDER OPEN ROTATOR CUFF REPAIR Bilateral ?2000/~ 2007   left; right    V TACH ABLATION N/A 06/08/2016   Procedure: V Tach Ablation;  Surgeon: Hillis Range, MD;  Location: [facility] INVASIVE CV LAB;  Service: Cardiovascular;  Laterality: N/A;   VENTRICULAR ABLATION SURGERY  06/08/2016   WRIST FRACTURE SURGERY Left 06/2005   "plates and screws and cadavaer bone"    Social History   Tobacco Use  Smoking Status Former   Packs/day: 2.00   Years: 30.00   Total pack years: 60.00   Types: Cigarettes   Quit date: 03/11/2005   Years since quitting: 16.6  Smokeless Tobacco Never    Social History   Substance and Sexual Activity  Alcohol Use Yes   Alcohol/week: 3.0 standard drinks of alcohol   Types: 3 Cans of beer per week    Family History  Adopted: Yes    Review of Systems: The review of systems is per the HPI.  All other systems were reviewed and are negative.  Physical Exam: BP (!) 106/54   Pulse 80   Ht 6' (1.829 m)   Wt 212 lb 9.6 oz (96.4 kg)   SpO2 100%   BMI 28.83 kg/m  GENERAL:   Well appearing WM in NAD HEENT:  PERRL, EOMI, sclera are clear. Oropharynx is clear. NECK:  No jugular venous distention, carotid upstroke brisk and symmetric, no bruits, no thyromegaly or adenopathy LUNGS:  Clear to auscultation bilaterally CHEST:  Unremarkable HEART:  RRR,  PMI not displaced or sustained,S1 and S2 within normal limits, no S3, no S4: no clicks, no rubs, no murmurs ABD:  Soft, nontender. BS +, no masses or bruits. No hepatomegaly, no splenomegaly EXT:  poor pedal pulses. Hair loss below knees.  no edema, no cyanosis no clubbing SKIN:  Warm and dry.  No rashes NEURO:  Alert and oriented x 3. Cranial nerves II through XII intact. PSYCH:  Cognitively intact       Wt Readings from Last 3 Encounters:  11/15/21 212 lb 9.6 oz (96.4 kg)  11/11/21 210 lb (95.3 kg)  08/04/21 214 lb 6.4 oz (97.3 kg)   LABORATORY DATA:  PENDING  Lab Results  Component Value Date   WBC 5.5 06/02/2016   HGB 12.0 (L) 06/02/2016   HCT 34.8 (L) 06/02/2016   PLT 213 06/02/2016   GLUCOSE 127 (H) 08/30/2021   CHOL 194 07/12/2021   TRIG 152 (H) 07/12/2021   HDL 43 07/12/2021   LDLDIRECT 206.0 04/14/2013   LDLCALC 124 (H) 07/12/2021   ALT 22 07/12/2021   AST 33 07/12/2021   NA 138 08/30/2021   K 4.8 08/30/2021   CL 103 08/30/2021   CREATININE 1.72 (H) 08/30/2021   BUN 28 (H) 08/30/2021   CO2 20 08/30/2021   TSH 2.970 09/03/2018   INR 1.05 01/19/2016   HGBA1C 6.0 10/06/2010   Labs dated 07/28/16: cholesterol 285, triglycerides 252, HDL 41, LDL 194.  Dated 12/13/16: A1c 6.1%.  Dated 09/18/18: cholesterol 229, triglycerides 200, HDL 35, LDL 154. LFTs normal. CBC normal Dated 01/16/19: normal TSH Dated 05/19/19. A1c 5.9% Dated 06/17/19: BUN 24, creatinine 1.63.  Dated 11/19/19: A1c 6.9%. cholesterol 124, triglycerides 206, HDL 44, LDL 39. Creatinine 1.8. BUN 21. Otherwise CMET, CBC and TSH normal. Dated 06/18/20: creatinine 1.41. otherwise BMET normal. Dated 07/28/20: A1c 6.9%. Dated 12/16/20:  cholesterol 228,  triglycerides 227, HDL 40, LDL 143, TSH normal Dated 04/25/21: A1c 6.2%.     Echo 04/13/16: Study Conclusions   - Left ventricle: The cavity size was mildly dilated. Systolic   function was mildly to moderately reduced. The estimated ejection   fraction was in the range of 40% to 45%. Diffuse hypokinesis.   Akinesis of the apical myocardium. Doppler parameters are   consistent with abnormal left ventricular relaxation (grade 1   diastolic dysfunction). Doppler parameters are consistent with   indeterminate ventricular filling pressure. - Aortic valve: Transvalvular velocity was within the normal range.   There was no stenosis. There was no regurgitation. - Mitral valve: Transvalvular velocity was within the normal range.   There was no evidence for stenosis. There was trivial   regurgitation. - Right ventricle: The cavity size was normal. Wall thickness was   normal. - Tricuspid valve: There was trivial regurgitation. - Pulmonary arteries: Systolic pressure was within the normal   range. PA peak pressure: 25 mm Hg (S).  Cardiac cath 01/19/16: Procedures   Left Heart Cath and Cors/Grafts Angiography  Conclusion     Prox LAD to Mid LAD lesion, 85 %stenosed. Ost Ramus to Ramus lesion, 55 %stenosed. Prox Cx to Mid Cx lesion, 100 %stenosed. Ost RCA to Dist RCA lesion, 100 %stenosed. SVG graft was visualized by angiography and is small. SVG graft was visualized by angiography and is small. LIMA graft was visualized by angiography and is large and anatomically normal. There is moderate left ventricular systolic dysfunction. LV end diastolic pressure is moderately elevated. The left ventricular ejection fraction is 35-45% by visual estimate.   1. Severe 3 vessel obstructive CAD 2. Large patent LIMA to the LAD 3. Patent SVG to OM2. This is a tiny graft supplying a very small OM  4. Patent SVG to PDA. This is also a tiny graft supplying a very tiny PDA 5. Moderate LV  dysfunction. 6. Elevated LV EDP   Plan: medical therapy. Antiarrhythmic drug therapy per EP team.     Echo 05/19/19: IMPRESSIONS     1. Left ventricular ejection fraction, by estimation, is 25 to 30%. The  left ventricle has severely decreased function. There is severe akinesis  of the left ventricular, mid-apical anteroseptal wall.   2. Right ventricular systolic function is severely reduced. The right  ventricular size is mildly enlarged.   3. Mild to moderate mitral valve regurgitation.    Echo 04/12/21 1. Left ventricular ejection fraction, by estimation, is 25 to 30%. Left  ventricular ejection fraction by 2D MOD biplane is 28.9 %. Left  ventricular ejection fraction by PLAX is 27 %. The left ventricle has  severely decreased function. The left  ventricle demonstrates regional wall motion abnormalities (see scoring  diagram/findings for description). The left ventricular internal cavity  size was moderately dilated. Left ventricular diastolic parameters are  consistent with Grade II diastolic  dysfunction (pseudonormalization).   2. Right ventricular systolic function is normal. The right ventricular  size is normal. There is moderately elevated pulmonary artery systolic  pressure.   3. Right atrial size was moderately dilated.   4. The mitral valve is normal in structure. Trivial mitral valve  regurgitation. No evidence of mitral stenosis.   5. The aortic valve is tricuspid. Aortic valve regurgitation is not  visualized. No aortic stenosis is present.   6. The inferior vena cava is normal in size with greater than 50%  respiratory variability, suggesting right atrial pressure of 3 mmHg.  Assessment / Plan: 1. Chronic systolic CHF status post CRT. EF reported 40-45% in 2018  but I thought at the time that this was overestimated. EF 25-30% in January.  Clinically  class  II He was unable to tolerate Entresto due to significant hypotension. Now on lisinopril 10 mg in am and  5 mg in pm, Coreg 25 mg bid, aldactone 25 mg daily and lasix 10 mg daily. On Farxiga. Encouraged with weight loss.   2. Ischemic cardiomyopathy. See above.  3. Coronary disease status post CABG in 2006. Cardiac catheterization in October 2017 showed all grafts were patent. Clinically without angina  4. Right bundle branch block.   5. Hyperlipidemia. Patient is intolerant to statins and Zetia. On Crestor only once a week. With addition of Repatha excellent response with LDL down to 39 but had significant elevation of blood sugar with A1c > 9 and repatha was stopped. Awaiting some financial assistance to be able to start Nexlitol.   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  Off AAD therapy. ICD check shows some runs of NSVT. Per Dr Lovena Le will continue to monitor.   7. CKD stage 3a  8. DM type 2. Last A1c 6.2%.  9. Bilateral hip pain with exertion. Check LE arterial dopplers.   I will follow up in 6 months.

## 2021-11-15 ENCOUNTER — Ambulatory Visit: Payer: Medicare Other | Admitting: Cardiology

## 2021-11-15 ENCOUNTER — Encounter: Payer: Self-pay | Admitting: Cardiology

## 2021-11-15 VITALS — BP 106/54 | HR 80 | Ht 72.0 in | Wt 212.6 lb

## 2021-11-15 DIAGNOSIS — I5022 Chronic systolic (congestive) heart failure: Secondary | ICD-10-CM | POA: Diagnosis not present

## 2021-11-15 DIAGNOSIS — Z95810 Presence of automatic (implantable) cardiac defibrillator: Secondary | ICD-10-CM | POA: Diagnosis not present

## 2021-11-15 DIAGNOSIS — I255 Ischemic cardiomyopathy: Secondary | ICD-10-CM | POA: Diagnosis not present

## 2021-11-15 DIAGNOSIS — I25118 Atherosclerotic heart disease of native coronary artery with other forms of angina pectoris: Secondary | ICD-10-CM | POA: Diagnosis not present

## 2021-11-15 DIAGNOSIS — E785 Hyperlipidemia, unspecified: Secondary | ICD-10-CM

## 2021-11-15 DIAGNOSIS — I472 Ventricular tachycardia, unspecified: Secondary | ICD-10-CM

## 2021-11-21 ENCOUNTER — Other Ambulatory Visit (HOSPITAL_BASED_OUTPATIENT_CLINIC_OR_DEPARTMENT_OTHER): Payer: Self-pay | Admitting: Family

## 2021-11-21 DIAGNOSIS — E1122 Type 2 diabetes mellitus with diabetic chronic kidney disease: Secondary | ICD-10-CM

## 2021-11-21 DIAGNOSIS — I5022 Chronic systolic (congestive) heart failure: Secondary | ICD-10-CM

## 2021-11-21 DIAGNOSIS — I25118 Atherosclerotic heart disease of native coronary artery with other forms of angina pectoris: Secondary | ICD-10-CM

## 2021-11-21 DIAGNOSIS — N1832 Chronic kidney disease, stage 3b: Secondary | ICD-10-CM

## 2021-11-21 DIAGNOSIS — I255 Ischemic cardiomyopathy: Secondary | ICD-10-CM

## 2021-11-21 NOTE — Telephone Encounter (Signed)
Pt of Dr. Jordan. Please review for refill. Thank you! 

## 2021-11-30 ENCOUNTER — Other Ambulatory Visit: Payer: Self-pay | Admitting: Internal Medicine

## 2021-11-30 DIAGNOSIS — Z95810 Presence of automatic (implantable) cardiac defibrillator: Secondary | ICD-10-CM

## 2021-11-30 DIAGNOSIS — I739 Peripheral vascular disease, unspecified: Secondary | ICD-10-CM

## 2021-11-30 DIAGNOSIS — I255 Ischemic cardiomyopathy: Secondary | ICD-10-CM

## 2021-12-06 ENCOUNTER — Ambulatory Visit (HOSPITAL_COMMUNITY)
Admission: RE | Admit: 2021-12-06 | Discharge: 2021-12-06 | Disposition: A | Discharge status: Home / Self Care | Payer: Medicare Other | Source: Ambulatory Visit | Attending: Cardiovascular Disease | Admitting: Cardiovascular Disease

## 2021-12-06 DIAGNOSIS — I255 Ischemic cardiomyopathy: Secondary | ICD-10-CM | POA: Diagnosis not present

## 2021-12-06 DIAGNOSIS — I739 Peripheral vascular disease, unspecified: Secondary | ICD-10-CM | POA: Diagnosis present

## 2021-12-06 DIAGNOSIS — I472 Ventricular tachycardia, unspecified: Secondary | ICD-10-CM | POA: Diagnosis present

## 2021-12-06 DIAGNOSIS — Z95810 Presence of automatic (implantable) cardiac defibrillator: Secondary | ICD-10-CM

## 2021-12-13 ENCOUNTER — Other Ambulatory Visit: Payer: Self-pay | Admitting: *Deleted

## 2021-12-13 DIAGNOSIS — I739 Peripheral vascular disease, unspecified: Secondary | ICD-10-CM

## 2021-12-13 DIAGNOSIS — M25551 Pain in right hip: Secondary | ICD-10-CM

## 2021-12-26 ENCOUNTER — Other Ambulatory Visit: Payer: Self-pay | Admitting: Cardiology

## 2021-12-29 ENCOUNTER — Ambulatory Visit (INDEPENDENT_AMBULATORY_CARE_PROVIDER_SITE_OTHER): Payer: Medicare Other

## 2021-12-29 DIAGNOSIS — I255 Ischemic cardiomyopathy: Secondary | ICD-10-CM | POA: Diagnosis not present

## 2021-12-30 LAB — CUP PACEART REMOTE DEVICE CHECK
Battery Remaining Longevity: 18 mo
Battery Voltage: 2.9 V
Brady Statistic AP VP Percent: 28.8 %
Brady Statistic AP VS Percent: 0.01 %
Brady Statistic AS VP Percent: 71.07 %
Brady Statistic AS VS Percent: 0.12 %
Brady Statistic RA Percent Paced: 28.63 %
Brady Statistic RV Percent Paced: 98.99 %
Date Time Interrogation Session: 20230921074224
HighPow Impedance: 64 Ohm
Implantable Lead Implant Date: 20130513
Implantable Lead Implant Date: 20130513
Implantable Lead Implant Date: 20130513
Implantable Lead Location: 753858
Implantable Lead Location: 753859
Implantable Lead Location: 753860
Implantable Lead Model: 181
Implantable Lead Model: 4396
Implantable Lead Model: 5076
Implantable Lead Serial Number: 316784
Implantable Pulse Generator Implant Date: 20160304
Lead Channel Impedance Value: 437 Ohm
Lead Channel Impedance Value: 437 Ohm
Lead Channel Impedance Value: 456 Ohm
Lead Channel Impedance Value: 456 Ohm
Lead Channel Impedance Value: 532 Ohm
Lead Channel Impedance Value: 817 Ohm
Lead Channel Pacing Threshold Amplitude: 0.875 V
Lead Channel Pacing Threshold Amplitude: 1 V
Lead Channel Pacing Threshold Pulse Width: 0.4 ms
Lead Channel Pacing Threshold Pulse Width: 0.4 ms
Lead Channel Sensing Intrinsic Amplitude: 1.25 mV
Lead Channel Sensing Intrinsic Amplitude: 1.25 mV
Lead Channel Sensing Intrinsic Amplitude: 10.875 mV
Lead Channel Sensing Intrinsic Amplitude: 10.875 mV
Lead Channel Setting Pacing Amplitude: 2 V
Lead Channel Setting Pacing Amplitude: 2.5 V
Lead Channel Setting Pacing Pulse Width: 0.4 ms
Lead Channel Setting Sensing Sensitivity: 0.45 mV

## 2022-01-03 ENCOUNTER — Ambulatory Visit: Payer: Medicare Other | Attending: Cardiovascular Disease | Admitting: Cardiovascular Disease

## 2022-01-03 ENCOUNTER — Encounter: Payer: Self-pay | Admitting: Cardiovascular Disease

## 2022-01-03 DIAGNOSIS — I739 Peripheral vascular disease, unspecified: Secondary | ICD-10-CM | POA: Insufficient documentation

## 2022-01-03 NOTE — Assessment & Plan Note (Signed)
Mr. Mulrooney was referred to me by Dr. Martinique for evaluation of PAD.  He does have a history of CAD and risk factors for vascular disease.  He complains of back and hip pain with ambulation.  He had ABIs performed in our office 12/06/2021 which were completely normal with triphasic waveforms.  He has palpable pedal pulses.  I suspect this is all related to his back.  I am going to get aortoiliac Dopplers to rule out more proximal disease.

## 2022-01-03 NOTE — Patient Instructions (Signed)
Medication Instructions:  Your physician recommends that you continue on your current medications as directed. Please refer to the Current Medication list given to you today.  *If you need a refill on your cardiac medications before your next appointment, please call your pharmacy*   Testing/Procedures: Dr. Gwenlyn Found has recommended that you have an Ultrasound of your AORTA/IVC/ILIACS.   To prepare for this test:  No food after 11PM the night before. Water is OK. (Don't drink liquids if you have been instructed not to for ANOTHER test).  No breakfast, no chewing gum, no smoking or carbonated beverages. Patient may take morning medications with water. Come in for test at least 15 minutes early to register. This procedure will be done at [address]    Follow-Up: At [facility], you and your health needs are our priority.  As part of our continuing mission to provide you with exceptional heart care, we have created designated Provider Care Teams.  These Care Teams include your primary Cardiologist (physician) and Advanced Practice Providers (APPs -  Physician Assistants and Nurse Practitioners) who all work together to provide you with the care you need, when you need it.  We recommend signing up for the patient portal called "MyChart".  Sign up information is provided on this After Visit Summary.  MyChart is used to connect with patients for Virtual Visits (Telemedicine).  Patients are able to view lab/test results, encounter notes, upcoming appointments, etc.  Non-urgent messages can be sent to your provider as well.   To learn more about what you can do with MyChart, go to [email or web address].    Your next appointment:   We will see you on an as needed basis.   Provider:   Quay Burow, MD

## 2022-01-03 NOTE — Progress Notes (Signed)
01/03/2022 [name]   January 01, 1956  [id number]  Primary Physician Geoffry Paradise, MD Primary Cardiologist: Runell Gess MD Nicholes Calamity, [place]  HPI:  [name] is a 66 y.o. moderately overweight married Caucasian male father of 2 children, grandfather for grand daughters who is accompanied by his wife Terri today.  He was referred by Dr. Swaziland, his cardiologist, for evaluation of symptomatic PAD.  He does have a history of CAD status post CABG in 2006.  He is got ischemic cardiomyopathy status post ICD implantation and VT ablation.  He does have remote tobacco abuse having quit the day of his heart attack as well as treated hypertension, diabetes and hyperlipidemia.  He notices hip and back pain when ambulating short distances at [employer].  He had Doppler studies performed 12/06/2021 revealing normal ABIs bilaterally.   Current Meds  Medication Sig   aspirin 81 MG tablet Take 81 mg by mouth daily.   carvedilol (COREG) 25 MG tablet TAKE 1 TABLET BY MOUTH 2 TIMES DAILY WITH A MEAL.   FARXIGA 10 MG TABS tablet TAKE 1 TABLET (10 MG TOTAL) BY MOUTH DAILY BEFORE BREAKFAST.   fenofibrate 160 MG tablet TAKE 1 TABLET BY MOUTH DAILY.   furosemide (LASIX) 20 MG tablet Take 0.5 tablets (10 mg total) by mouth daily.   lisinopril (ZESTRIL) 10 MG tablet TAKE 1 TABLET (10 MG) BY MOUTH IN THE MORNING AND 1/2 TABLET (5 MG) IN THE EVENING. *INSURANCE PLAN REQUIRES 10 MG TABS TO BE COVERED*   magnesium oxide (MAG-OX) 400 MG tablet Take 1 tablet (400 mg total) by mouth 2 (two) times daily. Please make overdue appt with Dr. Graciela Husbands before anymore refills. Thank you 1st attempt   metFORMIN (GLUCOPHAGE) 500 MG tablet Take 500 mg by mouth 2 (two) times daily with a meal.   NITROSTAT 0.4 MG SL tablet PLACE 1 TABLET UNDER THE TONGUE EVERY 5 MINUTES AS NEEDED FOR CHEST PAIN FOR UP TO 3 DOSES.   ONETOUCH VERIO test strip 1 each by Other route as needed for other.    rosuvastatin (CRESTOR) 20 MG  tablet Take 1 tablet by mouth once a week.   spironolactone (ALDACTONE) 25 MG tablet TAKE 1/2 TABLET (12.5 MG TOTAL) BY MOUTH DAILY.     Allergies  Allergen Reactions   Entresto [Sacubitril-Valsartan] Other (See Comments)    Hypotension, increased heart rate, dizziness, blurry vision   Linagliptin-Metformin Hcl     Other reaction(s): chest pain, GI upset   Amphotericin B Lipid Complex [Amphotericin B]     MYALGIAS   Vytorin [Ezetimibe-Simvastatin]     MYALGIAS   Evolocumab     Other reaction(s): Elevated BS   Statins Other (See Comments)    Myalgias     Social History   Socioeconomic History   Marital status: Married    Spouse name: Not on file   Number of children: 1   Years of education: Not on file   Highest education level: Not on file  Occupational History   Occupation: telephone service  Tobacco Use   Smoking status: Former    Packs/day: 2.00    Years: 30.00    Total pack years: 60.00    Types: Cigarettes    Quit date: 03/11/2005    Years since quitting: 16.8   Smokeless tobacco: Never  Vaping Use   Vaping Use: Never used  Substance and Sexual Activity   Alcohol use: Yes    Alcohol/week: 3.0 standard drinks of  alcohol    Types: 3 Cans of beer per week   Drug use: No   Sexual activity: Not Currently  Other Topics Concern   Not on file  Social History Narrative   Not on file   Social Determinants of Health   Financial Resource Strain: Not on file  Food Insecurity: Not on file  Transportation Needs: Not on file  Physical Activity: Not on file  Stress: Not on file  Social Connections: Not on file  Intimate Partner Violence: Not on file     Review of Systems: General: negative for chills, fever, night sweats or weight changes.  Cardiovascular: negative for chest pain, dyspnea on exertion, edema, orthopnea, palpitations, paroxysmal nocturnal dyspnea or shortness of breath Dermatological: negative for rash Respiratory: negative for cough or  wheezing Urologic: negative for hematuria Abdominal: negative for nausea, vomiting, diarrhea, bright red blood per rectum, melena, or hematemesis Neurologic: negative for visual changes, syncope, or dizziness All other systems reviewed and are otherwise negative except as noted above.    Blood pressure 122/62, pulse 63, height 6' (1.829 m), weight 211 lb 12.8 oz (96.1 kg), SpO2 97 %.  General appearance: alert and no distress Neck: no adenopathy, no carotid bruit, no JVD, supple, symmetrical, trachea midline, and thyroid not enlarged, symmetric, no tenderness/mass/nodules Lungs: clear to auscultation bilaterally Heart: regular rate and rhythm, S1, S2 normal, no murmur, click, rub or gallop Extremities: extremities normal, atraumatic, no cyanosis or edema Pulses: 2+ and symmetric Skin: Skin color, texture, turgor normal. No rashes or lesions Neurologic: Grossly normal  EKG AV paced rhythm at 63.  I personally reviewed this EKG.  ASSESSMENT AND PLAN:   Peripheral arterial disease [facility]) Mr. Voong was referred to me by Dr. Martinique for evaluation of PAD.  He does have a history of CAD and risk factors for vascular disease.  He complains of back and hip pain with ambulation.  He had ABIs performed in our office 12/06/2021 which were completely normal with triphasic waveforms.  He has palpable pedal pulses.  I suspect this is all related to his back.  I am going to get aortoiliac Dopplers to rule out more proximal disease.     Lorretta Harp MD FACP,FACC,FAHA, [facility] 01/03/2022 10:16 AM

## 2022-01-09 NOTE — Progress Notes (Signed)
Remote ICD transmission.   

## 2022-01-20 ENCOUNTER — Ambulatory Visit (HOSPITAL_BASED_OUTPATIENT_CLINIC_OR_DEPARTMENT_OTHER)
Admission: RE | Admit: 2022-01-20 | Discharge: 2022-01-20 | Disposition: A | Discharge status: Home / Self Care | Payer: Medicare Other | Source: Ambulatory Visit | Attending: Cardiovascular Disease | Admitting: Cardiovascular Disease

## 2022-01-20 ENCOUNTER — Ambulatory Visit (HOSPITAL_COMMUNITY)
Admission: RE | Admit: 2022-01-20 | Discharge: 2022-01-20 | Disposition: A | Discharge status: Home / Self Care | Payer: Medicare Other | Source: Ambulatory Visit | Attending: Cardiology | Admitting: Cardiology

## 2022-01-20 DIAGNOSIS — I739 Peripheral vascular disease, unspecified: Secondary | ICD-10-CM | POA: Insufficient documentation

## 2022-03-13 ENCOUNTER — Other Ambulatory Visit: Payer: Self-pay | Admitting: Cardiology

## 2022-05-18 ENCOUNTER — Telehealth: Payer: Self-pay | Admitting: Cardiology

## 2022-05-18 DIAGNOSIS — E1122 Type 2 diabetes mellitus with diabetic chronic kidney disease: Secondary | ICD-10-CM

## 2022-05-18 DIAGNOSIS — I255 Ischemic cardiomyopathy: Secondary | ICD-10-CM

## 2022-05-18 DIAGNOSIS — N1832 Chronic kidney disease, stage 3b: Secondary | ICD-10-CM

## 2022-05-18 DIAGNOSIS — I25118 Atherosclerotic heart disease of native coronary artery with other forms of angina pectoris: Secondary | ICD-10-CM

## 2022-05-18 DIAGNOSIS — I5022 Chronic systolic (congestive) heart failure: Secondary | ICD-10-CM

## 2022-05-18 MED ORDER — FUROSEMIDE 40 MG PO TABS: 40.0000 mg | ORAL_TABLET | Freq: Every day | ORAL | 0 refills | Status: DC

## 2022-05-18 NOTE — Telephone Encounter (Signed)
Pt c/o swelling: STAT is pt has developed SOB within 24 hours   How much weight have you gained and in what time span? Not sure.    If swelling, where is the swelling located? Both feet, legs and in calves.     Are you currently taking a fluid pill? Yes   Are you currently SOB? Yes, for the last month.    Do you have a log of your daily weights (if so, list)? No   Have you gained 3 pounds in a day or 5 pounds in a week? Not sure    Have you traveled recently? No.      Gets out of breath walking to the mailbox and back- gasping by the time he gets back to the house  Feet are so swollen that they hardly fit in his shoes.  It does leave an indention when press on ankles, but goes back after about 6-8 seconds.  Started taking 20 mg lasix (doubled his dose of 10mg ) this morning around 8am due to his increased swelling and shortness of breath.   Pt has a history of MI 2006- hx V-Tach- had ablation- has pacemaker, peripheral artery disease. Told patient that I will speak with the Doctor of the Day and get recommendations and call him back .

## 2022-05-18 NOTE — Telephone Encounter (Signed)
Received orders from Dr Percival Spanish- Patient needs to take 40mg  of Lasix today, Friday, Sat, Sun 2/8-2/11 and resume normal dose on Monday 05/22/22. Pt will need to get BMET on 05/22/22, Elevate feet and limit salt intake.  Called patient and gave the above information; he has enough Lasix at home to increase dose over the next 4 days. Also, made appt with Dr Martinique 05/22/22 at 1120. Told him he can come early to draw blood or just get it done after appt.   Pt verbalized understanding.

## 2022-05-18 NOTE — Telephone Encounter (Signed)
Pt c/o swelling: STAT is pt has developed SOB within 24 hours  How much weight have you gained and in what time span? Not sure.   If swelling, where is the swelling located? Both feet, legs and in calves.    Are you currently taking a fluid pill? Yes  Are you currently SOB? Yes, for the last month.   Do you have a log of your daily weights (if so, list)? No  Have you gained 3 pounds in a day or 5 pounds in a week? Not sure   Have you traveled recently? No.

## 2022-05-19 NOTE — Progress Notes (Unsigned)
[name] Date of Birth: 11-06-1955 Medical Record [id number]  History of Present Illness: [name] is seen for follow up CAD and CHF.  He has an ischemic CM and has CRT-D in place for primary prevention since April of 2013. EF by Echo was 45-50% at that time. Cardiac MRI showed an EF of 31%. Other issues include HLD (statin intolerant and intolerant to zetia), hemochromatosis, RBBB, PAF, type 2 DM and OSA. He is s/p CABG in 2006 following an MI. Cardiac cath in February 2013 showed patrent grafts. After CRT therapy he did have a significant improvement in dyspnea.   He was admitted in October 2017 with VT and received shocks. Cardiac cath demonstrated patent grafts. He was started on sotalol. In January 2018 he presented with syncope and recurrent VT. Switched to amiodarone. Was concerned about taking amiodarone long term. Underwent VT ablation by Dr. Rayann Heman on June 08, 2016. Amiodarone later discontinued.  Last device check in March showed runs of NSVT but no device therapies.   Last Echo in January 2023 showed EF 25-30%.  He is intolerant of niacin, statins (Simcor and Vytorin) and Zetia. Was seen by Dr Debara Pickett a year ago and approved for [place]. Patient decided not to start at that time.    On a prior visit we attempted to initiate him on Entresto. Lisinopril was washed out. Lasix changed to prn. Even at lower dose he was unable to tolerate Entresto due to hypotension with pressures in the [id number] systolic and symptoms of lightheadedness and dizziness. Lisinopril resumed.   He had chest x-ray 04/06/2021 with probable bibasilar infiltrates.  He was treated with Levaquin. He had echocardiogram 04/12/2021 ordered by primary care provider which showed LVEF 25-30%, wall motion abnormalities, LV moderately dilated, grade 2 diastolic dysfunction, moderately elevated PASP, trivial MR. He was seen in Jan with increased weight and dyspnea. Farxiga added to regimen. He had been tried on Repatha before but  reports sugar increased significantly and A1c went over 9. Repatha was stopped.  He was seen on August 8 by Dr Lovena Le for consideration of VT ablation. He has had 2 episodes of sustained VT over the last year. He has had multiple episodes of NSVT which he feels somewhat. Dr Lovena Le felt it was best to continue to monitor given risk of procedure and difficulty inducing VT on prior EP study.   On follow up he notes recent increase in dyspnea walking out to the mailbox. Weight went up to 220 lbs. More ankle edema. Lasix was increased to 40 mg daily with improvement in symptoms and weight reduced to 113 lbs. Denies any chest pain or palpitations. Prior hip pain resolved with a natural supplement.      Current Outpatient Medications  Medication Sig Dispense Refill   aspirin 81 MG tablet Take 81 mg by mouth daily.     carvedilol (COREG) 25 MG tablet TAKE 1 TABLET BY MOUTH 2 TIMES DAILY WITH A MEAL. 180 tablet 1   FARXIGA 10 MG TABS tablet TAKE 1 TABLET (10 MG TOTAL) BY MOUTH DAILY BEFORE BREAKFAST. 30 tablet 5   fenofibrate 160 MG tablet TAKE 1 TABLET BY MOUTH DAILY. 90 tablet 3   furosemide (LASIX) 20 MG tablet Take 0.5 tablets (10 mg total) by mouth daily. 45 tablet 10   furosemide (LASIX) 40 MG tablet Take 1 tablet (40 mg total) by mouth daily for 4 days. 4 tablet 0   lisinopril (ZESTRIL) 10 MG tablet TAKE 1 TABLET BY MOUTH IN THE  MORNING AND 1/2 TABLET IN THE EVENING. 135 tablet 3   magnesium oxide (MAG-OX) 400 MG tablet Take 1 tablet (400 mg total) by mouth 2 (two) times daily. Please make overdue appt with Dr. Caryl Comes before anymore refills. Thank you 1st attempt 60 tablet 0   metFORMIN (GLUCOPHAGE) 500 MG tablet Take 500 mg by mouth 2 (two) times daily with a meal.     NITROSTAT 0.4 MG SL tablet PLACE 1 TABLET UNDER THE TONGUE EVERY 5 MINUTES AS NEEDED FOR CHEST PAIN FOR UP TO 3 DOSES. 25 tablet 3   ONETOUCH VERIO test strip 1 each by Other route as needed for other.   6   rosuvastatin (CRESTOR)  20 MG tablet Take 1 tablet by mouth once a week.     spironolactone (ALDACTONE) 25 MG tablet TAKE 1/2 TABLET (12.5 MG TOTAL) BY MOUTH DAILY. 45 tablet 3   No current facility-administered medications for this visit.    Allergies  Allergen Reactions   Entresto [Sacubitril-Valsartan] Other (See Comments)    Hypotension, increased heart rate, dizziness, blurry vision   Linagliptin-Metformin Hcl     Other reaction(s): chest pain, GI upset   Amphotericin B Lipid Complex [Amphotericin B]     MYALGIAS   Vytorin [Ezetimibe-Simvastatin]     MYALGIAS   Evolocumab     Other reaction(s): Elevated BS   Statins Other (See Comments)    Myalgias     Past Medical History:  Diagnosis Date   AICD (automatic cardioverter/defibrillator) present    Anemia    Arthritis    "mild in my joints & knuckles" (05/10/2016)   CHF (congestive heart failure) (HCC)    Coronary artery disease    Hemochromatosis    Hyperlipidemia    Ischemic cardiomyopathy    EF 36%; prior anterior MI, s/p CABG x 3; s/p cath Feb 2013 showing grafts to be patent with severe LV dysfunction   Myocardial infarction ([place]) 2006   PAF (paroxysmal atrial fibrillation) ([place])    Pneumonia 2000   RBBB (right bundle branch block)    Sleep apnea    "can't tolerate mask" (05/10/2016)   Type II diabetes mellitus (HCC)    Ventricular tachycardia, sustained ([place]) 01/19/2016    Past Surgical History:  Procedure Laterality Date   BI-VENTRICULAR IMPLANTABLE CARDIOVERTER DEFIBRILLATOR N/A 08/21/2011   Procedure: BI-VENTRICULAR IMPLANTABLE CARDIOVERTER DEFIBRILLATOR  (CRT-D);  Surgeon: Deboraha Sprang, MD;  Location: [facility] CATH LAB;  Service: Cardiovascular;  Laterality: N/A;   BIV ICD GENERTAOR CHANGE OUT N/A 06/12/2014   Procedure: BIV ICD GENERTAOR CHANGE OUT;  Surgeon: Deboraha Sprang, MD;  Location: [facility] CATH LAB;  Service: Cardiovascular;  Laterality: N/A;   CARDIAC CATHETERIZATION N/A 01/19/2016   Procedure: Left Heart Cath and Cors/Grafts  Angiography;  Surgeon: Fianna Snowball M Martinique, MD;  Location: [facility] CV LAB;  Service: Cardiovascular;  Laterality: N/A;   CORONARY ARTERY BYPASS GRAFT  2006   Emergent CABG x 3 with LIMA to LAD, SVG to OM, SVG to distal RCA per Dr. Servando Snare   FRACTURE SURGERY     INGUINAL HERNIA REPAIR  02/09/2012   Procedure: HERNIA REPAIR INGUINAL ADULT;  Surgeon: Gwenyth Ober, MD;  Location: [place];  Service: General;  Laterality: Right;   INGUINAL HERNIA REPAIR Bilateral [phone]   INSERTION OF MESH  02/09/2012   Procedure: INSERTION OF MESH;  Surgeon: Gwenyth Ober, MD;  Location: [place];  Service: General;  Laterality: Right;   LEFT HEART CATHETERIZATION WITH CORONARY/GRAFT ANGIOGRAM N/A  05/25/2011   Procedure: LEFT HEART CATHETERIZATION WITH Beatrix Fetters;  Surgeon: Naveah Brave M Martinique, MD;  Location: [facility] CATH LAB;  Service: Cardiovascular;  Laterality: N/A;   LIVER BIOPSY  1990s   SHOULDER OPEN ROTATOR CUFF REPAIR Bilateral ?2000/~ 2007   left; right    V TACH ABLATION N/A 06/08/2016   Procedure: V Tach Ablation;  Surgeon: Thompson Grayer, MD;  Location: [place] CV LAB;  Service: Cardiovascular;  Laterality: N/A;   VENTRICULAR ABLATION SURGERY  06/08/2016   WRIST FRACTURE SURGERY Left 06/2005   "plates and screws and cadavaer bone"    Social History   Tobacco Use  Smoking Status Former   Packs/day: 2.00   Years: 30.00   Total pack years: 60.00   Types: Cigarettes   Quit date: 03/11/2005   Years since quitting: 17.2  Smokeless Tobacco Never    Social History   Substance and Sexual Activity  Alcohol Use Yes   Alcohol/week: 3.0 standard drinks of alcohol   Types: 3 Cans of beer per week    Family History  Adopted: Yes    Review of Systems: The review of systems is per the HPI.  All other systems were reviewed and are negative.  Physical Exam: There were no vitals taken for this visit. GENERAL:  Well appearing WM in NAD HEENT:  PERRL, EOMI, sclera are clear. Oropharynx is  clear. NECK:  No jugular venous distention, carotid upstroke brisk and symmetric, no bruits, no thyromegaly or adenopathy LUNGS:  Clear to auscultation bilaterally CHEST:  Unremarkable HEART:  RRR,  PMI not displaced or sustained,S1 and S2 within normal limits, no S3, no S4: no clicks, no rubs, no murmurs ABD:  Soft, nontender. BS +, no masses or bruits. No hepatomegaly, no splenomegaly EXT:  poor pedal pulses. Hair loss below knees.  no edema, no cyanosis no clubbing SKIN:  Warm and dry.  No rashes NEURO:  Alert and oriented x 3. Cranial nerves II through XII intact. PSYCH:  Cognitively intact       Wt Readings from Last 3 Encounters:  01/03/22 211 lb 12.8 oz (96.1 kg)  11/15/21 212 lb 9.6 oz (96.4 kg)  11/11/21 210 lb (95.3 kg)   LABORATORY DATA:  PENDING  Lab Results  Component Value Date   WBC 5.5 06/02/2016   HGB 12.0 (L) 06/02/2016   HCT 34.8 (L) 06/02/2016   PLT 213 06/02/2016   GLUCOSE 127 (H) 08/30/2021   CHOL 194 07/12/2021   TRIG 152 (H) 07/12/2021   HDL 43 07/12/2021   LDLDIRECT 206.0 04/14/2013   LDLCALC 124 (H) 07/12/2021   ALT 22 07/12/2021   AST 33 07/12/2021   NA 138 08/30/2021   K 4.8 08/30/2021   CL 103 08/30/2021   CREATININE 1.72 (H) 08/30/2021   BUN 28 (H) 08/30/2021   CO2 20 08/30/2021   TSH 2.970 09/03/2018   INR 1.05 01/19/2016   HGBA1C 6.0 10/06/2010   Labs dated 07/28/16: cholesterol 285, triglycerides 252, HDL 41, LDL 194.  Dated 12/13/16: A1c 6.1%.  Dated 09/18/18: cholesterol 229, triglycerides 200, HDL 35, LDL 154. LFTs normal. CBC normal Dated 01/16/19: normal TSH Dated 05/19/19. A1c 5.9% Dated 06/17/19: BUN 24, creatinine 1.63.  Dated 11/19/19: A1c 6.9%. cholesterol 124, triglycerides 206, HDL 44, LDL 39. Creatinine 1.8. BUN 21. Otherwise CMET, CBC and TSH normal. Dated 06/18/20: creatinine 1.41. otherwise BMET normal. Dated 07/28/20: A1c 6.9%. Dated 12/16/20: cholesterol 228, triglycerides 227, HDL 40, LDL 143, TSH normal Dated 04/25/21: A1c  6.2%.  Dated 12/28/21: cholesterol 211, triglycerides 177, HDL 43, LDL 133. A1c 6.8%. chemistries and TSH normal.     Echo 04/13/16: Study Conclusions   - Left ventricle: The cavity size was mildly dilated. Systolic   function was mildly to moderately reduced. The estimated ejection   fraction was in the range of 40% to 45%. Diffuse hypokinesis.   Akinesis of the apical myocardium. Doppler parameters are   consistent with abnormal left ventricular relaxation (grade 1   diastolic dysfunction). Doppler parameters are consistent with   indeterminate ventricular filling pressure. - Aortic valve: Transvalvular velocity was within the normal range.   There was no stenosis. There was no regurgitation. - Mitral valve: Transvalvular velocity was within the normal range.   There was no evidence for stenosis. There was trivial   regurgitation. - Right ventricle: The cavity size was normal. Wall thickness was   normal. - Tricuspid valve: There was trivial regurgitation. - Pulmonary arteries: Systolic pressure was within the normal   range. PA peak pressure: 25 mm Hg (S).  Cardiac cath 01/19/16: Procedures   Left Heart Cath and Cors/Grafts Angiography  Conclusion     Prox LAD to Mid LAD lesion, 85 %stenosed. Ost Ramus to Ramus lesion, 55 %stenosed. Prox Cx to Mid Cx lesion, 100 %stenosed. Ost RCA to Dist RCA lesion, 100 %stenosed. SVG graft was visualized by angiography and is small. SVG graft was visualized by angiography and is small. LIMA graft was visualized by angiography and is large and anatomically normal. There is moderate left ventricular systolic dysfunction. LV end diastolic pressure is moderately elevated. The left ventricular ejection fraction is 35-45% by visual estimate.   1. Severe 3 vessel obstructive CAD 2. Large patent LIMA to the LAD 3. Patent SVG to OM2. This is a tiny graft supplying a very small OM  4. Patent SVG to PDA. This is also a tiny graft supplying a very  tiny PDA 5. Moderate LV dysfunction. 6. Elevated LV EDP   Plan: medical therapy. Antiarrhythmic drug therapy per EP team.     Echo 05/19/19: IMPRESSIONS     1. Left ventricular ejection fraction, by estimation, is 25 to 30%. The  left ventricle has severely decreased function. There is severe akinesis  of the left ventricular, mid-apical anteroseptal wall.   2. Right ventricular systolic function is severely reduced. The right  ventricular size is mildly enlarged.   3. Mild to moderate mitral valve regurgitation.    Echo 04/12/21 1. Left ventricular ejection fraction, by estimation, is 25 to 30%. Left  ventricular ejection fraction by 2D MOD biplane is 28.9 %. Left  ventricular ejection fraction by PLAX is 27 %. The left ventricle has  severely decreased function. The left  ventricle demonstrates regional wall motion abnormalities (see scoring  diagram/findings for description). The left ventricular internal cavity  size was moderately dilated. Left ventricular diastolic parameters are  consistent with Grade II diastolic  dysfunction (pseudonormalization).   2. Right ventricular systolic function is normal. The right ventricular  size is normal. There is moderately elevated pulmonary artery systolic  pressure.   3. Right atrial size was moderately dilated.   4. The mitral valve is normal in structure. Trivial mitral valve  regurgitation. No evidence of mitral stenosis.   5. The aortic valve is tricuspid. Aortic valve regurgitation is not  visualized. No aortic stenosis is present.   6. The inferior vena cava is normal in size with greater than 50%  respiratory variability, suggesting right atrial pressure  of 3 mmHg.     Assessment / Plan: 1. Chronic systolic CHF status post CRT. EF reported 40-45% in 2018  but I thought at the time that this was overestimated. EF 25-30% in January 2023.  Clinically  class  II- III. Recent increase in weight and edema that has responded to  increase in lasix to 40 mg daily.  Now on lisinopril 10 mg in am and 5 mg in pm, Coreg 25 mg bid, aldactone 25 mg daily. Unable to afford [place].  Continue low sodium diet. Will check BMET today on higher lasix dose.   2. Ischemic cardiomyopathy. See above.  3. Coronary disease status post CABG in 2006. Cardiac catheterization in October 2017 showed all grafts were patent. Clinically without angina  4. Right bundle branch block.   5. Hyperlipidemia. Patient is intolerant to statins and Zetia. On Crestor only once a week. With addition of Repatha excellent response with LDL down to 39 but had significant elevation of blood sugar with A1c > 9 and repatha was stopped. Unable to afford Nexlitol.   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  Off AAD therapy. ICD check shows some runs of NSVT. Per Dr Lovena Le will continue to monitor.   7. CKD stage 3a  8. DM type 2. Last A1c 6.8%. per Dr Reynaldo Minium  9. Bilateral hip pain with exertion.  LE arterial dopplers showed circulation was OK. Symptoms improved with a natural supplement.  I will follow up in 4 months.

## 2022-05-22 ENCOUNTER — Ambulatory Visit: Payer: Medicare Other | Attending: Cardiology | Admitting: Cardiology

## 2022-05-22 ENCOUNTER — Encounter: Payer: Self-pay | Admitting: Cardiology

## 2022-05-22 VITALS — BP 126/78 | HR 85 | Ht 72.0 in | Wt 215.6 lb

## 2022-05-22 DIAGNOSIS — I472 Ventricular tachycardia, unspecified: Secondary | ICD-10-CM

## 2022-05-22 DIAGNOSIS — I255 Ischemic cardiomyopathy: Secondary | ICD-10-CM

## 2022-05-22 DIAGNOSIS — I25118 Atherosclerotic heart disease of native coronary artery with other forms of angina pectoris: Secondary | ICD-10-CM | POA: Diagnosis not present

## 2022-05-22 DIAGNOSIS — I5022 Chronic systolic (congestive) heart failure: Secondary | ICD-10-CM | POA: Diagnosis not present

## 2022-05-22 DIAGNOSIS — E785 Hyperlipidemia, unspecified: Secondary | ICD-10-CM

## 2022-05-22 DIAGNOSIS — Z95810 Presence of automatic (implantable) cardiac defibrillator: Secondary | ICD-10-CM

## 2022-05-22 MED ORDER — FUROSEMIDE 40 MG PO TABS: 40.0000 mg | ORAL_TABLET | Freq: Every day | ORAL | 3 refills | Status: DC

## 2022-05-22 NOTE — Patient Instructions (Signed)
Medication Instructions:  No changes *If you need a refill on your cardiac medications before your next appointment, please call your pharmacy*   Lab Work: BMET and A1C today If you have labs (blood work) drawn today and your tests are completely normal, you will receive your results only by: [place] (if you have MyChart) OR A paper copy in the mail If you have any lab test that is abnormal or we need to change your treatment, we will call you to review the results.  Follow-Up: At [facility], you and your health needs are our priority.  As part of our continuing mission to provide you with exceptional heart care, we have created designated Provider Care Teams.  These Care Teams include your primary Cardiologist (physician) and Advanced Practice Providers (APPs -  Physician Assistants and Nurse Practitioners) who all work together to provide you with the care you need, when you need it.  We recommend signing up for the patient portal called "MyChart".  Sign up information is provided on this After Visit Summary.  MyChart is used to connect with patients for Virtual Visits (Telemedicine).  Patients are able to view lab/test results, encounter notes, upcoming appointments, etc.  Non-urgent messages can be sent to your provider as well.   To learn more about what you can do with MyChart, go to [email or web address].    Your next appointment:   4 month(s)  Provider:   Peter Martinique, MD

## 2022-05-23 ENCOUNTER — Telehealth: Payer: Self-pay | Admitting: Cardiovascular Disease

## 2022-05-23 LAB — BASIC METABOLIC PANEL
BUN/Creatinine Ratio: 14 (ref 10–24)
BUN: 25 mg/dL (ref 8–27)
CO2: 21 mmol/L (ref 20–29)
Calcium: 10 mg/dL (ref 8.6–10.2)
Chloride: 99 mmol/L (ref 96–106)
Creatinine, Ser: 1.82 mg/dL — ABNORMAL HIGH (ref 0.76–1.27)
Glucose: 178 mg/dL — ABNORMAL HIGH (ref 70–99)
Potassium: 4 mmol/L (ref 3.5–5.2)
Sodium: 137 mmol/L (ref 134–144)
eGFR: 40 mL/min/{1.73_m2} — ABNORMAL LOW (ref >59)

## 2022-05-23 LAB — HEMOGLOBIN A1C
Est. average glucose Bld gHb Est-mCnc: 148 mg/dL
Hgb A1c MFr Bld: 6.8 % — ABNORMAL HIGH (ref 4.8–5.6)

## 2022-05-23 NOTE — Telephone Encounter (Signed)
*  STAT* If patient is at the pharmacy, call can be transferred to refill team.   1. Which medications need to be refilled? (please list name of each medication and dose if known) furosemide (LASIX) 40 MG tablet   2. Which pharmacy/location (including street and city if local pharmacy) is medication to be sent to?n  [place], [place]    3. Do they need a 30 day or 90 day supply? [place]

## 2022-05-24 ENCOUNTER — Other Ambulatory Visit: Payer: Self-pay | Admitting: Cardiology

## 2022-05-26 ENCOUNTER — Telehealth: Payer: Self-pay | Admitting: Cardiovascular Disease

## 2022-05-26 NOTE — Telephone Encounter (Signed)
*  STAT* If patient is at the pharmacy, call can be transferred to refill team.   1. Which medications need to be refilled? (please list name of each medication and dose if known)   furosemide (LASIX) 40 MG tablet    2. Which pharmacy/location (including street and city if local pharmacy) is medication to be sent to?   [place], [place] - [place]    3. Do they need a 30 day or 90 day supply? [place]

## 2022-05-29 ENCOUNTER — Other Ambulatory Visit: Payer: Self-pay

## 2022-05-29 ENCOUNTER — Telehealth: Payer: Self-pay

## 2022-05-29 MED ORDER — FUROSEMIDE 40 MG PO TABS: 40.0000 mg | ORAL_TABLET | Freq: Every day | ORAL | 3 refills | Status: DC

## 2022-05-29 NOTE — Progress Notes (Signed)
Pt walked in today. He states "I am trying to get my medication refilled and the drug store has no record of it. They say they have sent it in 4 times." Pt made aware sometimes it takes some time to get refills completed due to staffing. He verbalized understanding. Pt states "Dr. Martinique wanted me to stay on 40 mg until he sees me again in 4 months." Lasix 40 mg sent into [facility] per pt's request.

## 2022-05-29 NOTE — Telephone Encounter (Signed)
Pt walked in today. He states "I am trying to get my medication refilled and the drug store has no record of it. They say they have sent it in 4 times." Pt made aware sometimes it takes some time to get refills completed due to staffing. He verbalized understanding. Pt states "Dr. Martinique wanted me to stay on 40 mg until he sees me again in 4 months." Lasix 40 mg sent into [facility] per pt's request.

## 2022-06-26 ENCOUNTER — Other Ambulatory Visit: Payer: Self-pay | Admitting: Cardiology

## 2022-06-29 ENCOUNTER — Ambulatory Visit (INDEPENDENT_AMBULATORY_CARE_PROVIDER_SITE_OTHER): Payer: Medicare Other

## 2022-06-29 DIAGNOSIS — I255 Ischemic cardiomyopathy: Secondary | ICD-10-CM

## 2022-07-06 ENCOUNTER — Telehealth: Payer: Self-pay

## 2022-07-06 LAB — CUP PACEART REMOTE DEVICE CHECK
Battery Remaining Longevity: 12 mo
Battery Voltage: 2.86 V
Brady Statistic AP VP Percent: 16.08 %
Brady Statistic AP VS Percent: 0.01 %
Brady Statistic AS VP Percent: 83.31 %
Brady Statistic AS VS Percent: 0.6 %
Brady Statistic RA Percent Paced: 15.96 %
Brady Statistic RV Percent Paced: 98.23 %
Date Time Interrogation Session: 20240327155837
HighPow Impedance: 76 Ohm
Implantable Lead Connection Status: 753985
Implantable Lead Connection Status: 753985
Implantable Lead Connection Status: 753985
Implantable Lead Implant Date: 20130513
Implantable Lead Implant Date: 20130513
Implantable Lead Implant Date: 20130513
Implantable Lead Location: 753858
Implantable Lead Location: 753859
Implantable Lead Location: 753860
Implantable Lead Model: 181
Implantable Lead Model: 4396
Implantable Lead Model: 5076
Implantable Lead Serial Number: 316784
Implantable Pulse Generator Implant Date: 20160304
Lead Channel Impedance Value: 437 Ohm
Lead Channel Impedance Value: 456 Ohm
Lead Channel Impedance Value: 494 Ohm
Lead Channel Impedance Value: 494 Ohm
Lead Channel Impedance Value: 589 Ohm
Lead Channel Impedance Value: 836 Ohm
Lead Channel Pacing Threshold Amplitude: 0.75 V
Lead Channel Pacing Threshold Amplitude: 1 V
Lead Channel Pacing Threshold Pulse Width: 0.4 ms
Lead Channel Pacing Threshold Pulse Width: 0.4 ms
Lead Channel Sensing Intrinsic Amplitude: 1.375 mV
Lead Channel Sensing Intrinsic Amplitude: 1.375 mV
Lead Channel Sensing Intrinsic Amplitude: 8.75 mV
Lead Channel Sensing Intrinsic Amplitude: 8.75 mV
Lead Channel Setting Pacing Amplitude: 2.25 V
Lead Channel Setting Pacing Amplitude: 2.5 V
Lead Channel Setting Pacing Pulse Width: 0.4 ms
Lead Channel Setting Sensing Sensitivity: 0.45 mV
Zone Setting Status: 755011

## 2022-07-06 NOTE — Telephone Encounter (Signed)
Patient made aware of [place] DMV driving restrictions x6 months and shock plan with verbal understanding.

## 2022-07-06 NOTE — Telephone Encounter (Signed)
Following alert received from [employer] received for Scheduled remote reviewed. Normal device function.   First transmissison since 12/2021 2 treated VF and 5 treated VT episodes, EGM's show sustained VT, all episodes pace terminated with 1-2 bursts of ATP 161 NSVT, 2 high rate.  There has been an increase in ventricular ectopy since February - route to triage.  Patient denies any symptoms. States he was unaware of any pacing therapies. Report compliance with medications.   Sending to Dr. Lovena Le since he last saw Dr. Lovena Le 11/11/2021 for consideration of VT ablation.

## 2022-07-12 NOTE — Telephone Encounter (Signed)
He needs to be seen in clinic ASAP. GT

## 2022-07-13 NOTE — Telephone Encounter (Signed)
Please make patient ASAP per Dr. Lovena Le.

## 2022-07-14 ENCOUNTER — Other Ambulatory Visit: Payer: Self-pay | Admitting: Cardiology

## 2022-07-19 ENCOUNTER — Ambulatory Visit: Payer: Medicare Other | Attending: Internal Medicine | Admitting: Internal Medicine

## 2022-07-19 VITALS — BP 110/64 | HR 83 | Ht 72.0 in | Wt 211.0 lb

## 2022-07-19 DIAGNOSIS — I472 Ventricular tachycardia, unspecified: Secondary | ICD-10-CM

## 2022-07-19 DIAGNOSIS — I255 Ischemic cardiomyopathy: Secondary | ICD-10-CM

## 2022-07-19 DIAGNOSIS — Z95810 Presence of automatic (implantable) cardiac defibrillator: Secondary | ICD-10-CM | POA: Diagnosis not present

## 2022-07-19 NOTE — Patient Instructions (Addendum)
Medication Instructions:  Your physician recommends that you continue on your current medications as directed. Please refer to the Current Medication list given to you today.  *If you need a refill on your cardiac medications before your next appointment, please call your pharmacy*  Lab Work: You will need to have lab work, CBC and BMET, drawn within 30 days of your procedure with Dr. Ladona Ridgel.   If you have labs (blood work) drawn today and your tests are completely normal, you will receive your results only by: MyChart Message (if you have MyChart) OR A paper copy in the mail If you have any lab test that is abnormal or we need to change your treatment, we will call you to review the results.  Testing/Procedures: None ordered.  Follow-Up: Dr. [name] has ordered a Medtronic BiV ICD upgrade, LV Lead malfunction, for worsening heart failure, to be scheduled.  Surgical scrub was given to patient at visit. Pt will need a Sotalol admission after procedure.   Dates available / given to patient are:  May 7, 13, 20, 21, and 31.  Please selected a day, and call us at [phone] to schedule this procedure.    Provider:   [name], MD{or one of the following Advanced Practice Providers on your designated Care Team:   Francis Dowse, [place] Casimiro Needle "Mardelle Matte" Lanna Poche, [place]  Remote monitoring is used to monitor your ICD from home. This monitoring reduces the number of office visits required to check your device to one time per year. It allows [place] to keep an eye on the functioning of your device to ensure it is working properly. You are scheduled for a device check from home on 09/28/22. You may send your transmission at any time that day. If you have a wireless device, the transmission will be sent automatically. After your physician reviews your transmission, you will receive a postcard with your next transmission date.

## 2022-07-19 NOTE — H&P (View-Only) (Signed)
    HPI Mr. Galano returns today for followup. He is referred by Dr. SK to consider VT ablation. He has a long h/o CAD and chronic systolic heart failure s/p biv ICD insertion. The patient has his LV lead turned off due to diaghragmatic stimulation. He has class 2 CHF. He has had 7 episodes of sustained VT over the last year. He has had multiple episodes of NSVT which he feels somewhat. He has not been shocked. He denies syncope.  Allergies  Allergen Reactions   Entresto [Sacubitril-Valsartan] Other (See Comments)    Hypotension, increased heart rate, dizziness, blurry vision   Linagliptin-Metformin Hcl     Other reaction(s): chest pain, GI upset   Amphotericin B Lipid Complex [Amphotericin B]     MYALGIAS   Vytorin [Ezetimibe-Simvastatin]     MYALGIAS   Evolocumab     Other reaction(s): Elevated BS   Statins Other (See Comments)    Myalgias      Current Outpatient Medications  Medication Sig Dispense Refill   ALPRAZolam (XANAX) 0.25 MG tablet Take 0.25 mg by mouth daily as needed for sleep.     aspirin 81 MG tablet Take 81 mg by mouth daily.     carvedilol (COREG) 25 MG tablet TAKE 1 TABLET BY MOUTH 2 TIMES DAILY WITH A MEAL. 180 tablet 1   fenofibrate 160 MG tablet TAKE 1 TABLET BY MOUTH DAILY. 90 tablet 3   furosemide (LASIX) 40 MG tablet Take 1 tablet (40 mg total) by mouth daily. 90 tablet 3   lisinopril (ZESTRIL) 10 MG tablet TAKE 1 TABLET BY MOUTH IN THE MORNING AND 1/2 TABLET IN THE EVENING. 135 tablet 3   magnesium oxide (MAG-OX) 400 MG tablet Take 1 tablet (400 mg total) by mouth 2 (two) times daily. Please make overdue appt with Dr. Klein before anymore refills. Thank you 1st attempt 60 tablet 0   metFORMIN (GLUCOPHAGE) 500 MG tablet Take 500 mg by mouth 2 (two) times daily with a meal.     NITROSTAT 0.4 MG SL tablet PLACE 1 TABLET UNDER THE TONGUE EVERY 5 MINUTES AS NEEDED FOR CHEST PAIN FOR UP TO 3 DOSES. 25 tablet 3   ONETOUCH VERIO test strip 1 each by Other route  as needed for other.   6   rosuvastatin (CRESTOR) 20 MG tablet Take 1 tablet by mouth once a week.     spironolactone (ALDACTONE) 25 MG tablet TAKE 1/2 TABLET (12.5 MG TOTAL) BY MOUTH DAILY. 45 tablet 3   No current facility-administered medications for this visit.     Past Medical History:  Diagnosis Date   AICD (automatic cardioverter/defibrillator) present    Anemia    Arthritis    "mild in my joints & knuckles" (05/10/2016)   CHF (congestive heart failure) (HCC)    Coronary artery disease    Hemochromatosis    Hyperlipidemia    Ischemic cardiomyopathy    EF 36%; prior anterior MI, s/p CABG x 3; s/p cath Feb 2013 showing grafts to be patent with severe LV dysfunction   Myocardial infarction (HCC) 2006   PAF (paroxysmal atrial fibrillation) (HCC)    Pneumonia 2000   RBBB (right bundle branch block)    Sleep apnea    "can't tolerate mask" (05/10/2016)   Type II diabetes mellitus (HCC)    Ventricular tachycardia, 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    ROS:   All systems reviewed and negative except as noted in the HPI.   Past Surgical History:  Procedure   Laterality Date   BI-VENTRICULAR IMPLANTABLE CARDIOVERTER DEFIBRILLATOR N/A 08/21/2011   Procedure: BI-VENTRICULAR IMPLANTABLE CARDIOVERTER DEFIBRILLATOR  (CRT-D);  Surgeon: Steven C Klein, MD;  Location: [facility] CATH LAB;  Service: Cardiovascular;  Laterality: N/A;   BIV ICD GENERTAOR CHANGE OUT N/A 06/12/2014   Procedure: BIV ICD GENERTAOR CHANGE OUT;  Surgeon: Steven C Klein, MD;  Location: [facility] CATH LAB;  Service: Cardiovascular;  Laterality: N/A;   CARDIAC CATHETERIZATION N/A 01/19/2016   Procedure: Left Heart Cath and Cors/Grafts Angiography;  Surgeon: Peter M Jordan, MD;  Location: [facility] INVASIVE CV LAB;  Service: Cardiovascular;  Laterality: N/A;   CORONARY ARTERY BYPASS GRAFT  2006   Emergent CABG x 3 with LIMA to LAD, SVG to OM, SVG to distal RCA per Dr. Gerhardt   FRACTURE SURGERY     INGUINAL HERNIA REPAIR  02/09/2012   Procedure:  HERNIA REPAIR INGUINAL ADULT;  Surgeon: James O Wyatt, MD;  Location: [facility] OR;  Service: General;  Laterality: Right;   INGUINAL HERNIA REPAIR Bilateral 1990s-2013   INSERTION OF MESH  02/09/2012   Procedure: INSERTION OF MESH;  Surgeon: James O Wyatt, MD;  Location: [facility] OR;  Service: General;  Laterality: Right;   LEFT HEART CATHETERIZATION WITH CORONARY/GRAFT ANGIOGRAM N/A 05/25/2011   Procedure: LEFT HEART CATHETERIZATION WITH CORONARY/GRAFT ANGIOGRAM;  Surgeon: Peter M Jordan, MD;  Location: [facility] CATH [facility];  Service: Cardiovascular;  Laterality: N/A;   LIVER BIOPSY  1990s   SHOULDER OPEN ROTATOR CUFF REPAIR Bilateral ?2000/~ 2007   left; right    V TACH ABLATION N/A 06/08/2016   Procedure: V Tach Ablation;  Surgeon: James Allred, MD;  Location: [facility] INVASIVE CV LAB;  Service: Cardiovascular;  Laterality: N/A;   VENTRICULAR ABLATION SURGERY  06/08/2016   WRIST FRACTURE SURGERY Left 06/2005   "plates and screws and cadavaer bone"     Family History  Adopted: Yes     Social History   Socioeconomic History   Marital status: Married    Spouse name: Not on file   Number of children: 1   Years of education: Not on file   Highest education level: Not on file  Occupational History   Occupation: telephone service  Tobacco Use   Smoking status: Former    Packs/day: 2.00    Years: 30.00    Additional pack years: 0.00    Total pack years: 60.00    Types: Cigarettes    Quit date: 03/11/2005    Years since quitting: 17.3   Smokeless tobacco: Never  Vaping Use   Vaping Use: Never used  Substance and Sexual Activity   Alcohol use: Yes    Alcohol/week: 3.0 standard drinks of alcohol    Types: 3 Cans of beer per week   Drug use: No   Sexual activity: Not Currently  Other Topics Concern   Not on file  Social History Narrative   Not on file   Social Determinants of Health   Financial Resource Strain: Not on file  Food Insecurity: Not on file  Transportation Needs: Not on file  Physical  Activity: Not on file  Stress: Not on file  Social Connections: Not on file  Intimate Partner Violence: Not on file     BP 110/64   Pulse 83   Ht 6' (1.829 m)   Wt 211 lb (95.7 kg)   SpO2 96%   BMI 28.62 kg/m   Physical Exam:  Well appearing NAD HEENT: Unremarkable Neck:  No JVD, no thyromegally Lymphatics:  No adenopathy   Back:  No CVA tenderness Lungs:  Clear HEART:  Regular rate rhythm, no murmurs, no rubs, no clicks Abd:  soft, positive bowel sounds, no organomegally, no rebound, no guarding Ext:  2 plus pulses, no edema, no cyanosis, no clubbing Skin:  No rashes no nodules Neuro:  CN II through XII intact, motor grossly intact  EKG  DEVICE  Normal device function.  See PaceArt for details.   Assess/Plan:  Recurrent VT - I discussed the treatment options.  He has only had 7 sustained episodes requiring ATP alone in the last 3 months. I would recommend watchful waiting. I might consider a retrial of sotalol if we can work on resynchroniztion. Chronic systolic heart failure - he has class 2 symptoms. I have recommended upgrading and try to perform left bundle area pacing. 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 CAD, s/p CABG - he is s/p MI but denies any anginal symptoms  Tyhesha Dutson,MD 

## 2022-07-19 NOTE — Progress Notes (Signed)
HPI [name] returns today for followup. He is referred by Dr. Crissie SicklesSK to consider VT ablation. He has a long h/o CAD and chronic systolic heart failure s/p biv ICD insertion. The patient has his LV lead turned off due to diaghragmatic stimulation. He has class 2 CHF. He has had 7 episodes of sustained VT over the last year. He has had multiple episodes of NSVT which he feels somewhat. He has not been shocked. He denies syncope.  Allergies  Allergen Reactions   Entresto [Sacubitril-Valsartan] Other (See Comments)    Hypotension, increased heart rate, dizziness, blurry vision   Linagliptin-Metformin Hcl     Other reaction(s): chest pain, GI upset   Amphotericin B Lipid Complex [Amphotericin B]     MYALGIAS   Vytorin [Ezetimibe-Simvastatin]     MYALGIAS   Evolocumab     Other reaction(s): Elevated BS   Statins Other (See Comments)    Myalgias      Current Outpatient Medications  Medication Sig Dispense Refill   ALPRAZolam (XANAX) 0.25 MG tablet Take 0.25 mg by mouth daily as needed for sleep.     aspirin 81 MG tablet Take 81 mg by mouth daily.     carvedilol (COREG) 25 MG tablet TAKE 1 TABLET BY MOUTH 2 TIMES DAILY WITH A MEAL. 180 tablet 1   fenofibrate 160 MG tablet TAKE 1 TABLET BY MOUTH DAILY. 90 tablet 3   furosemide (LASIX) 40 MG tablet Take 1 tablet (40 mg total) by mouth daily. 90 tablet 3   lisinopril (ZESTRIL) 10 MG tablet TAKE 1 TABLET BY MOUTH IN THE MORNING AND 1/2 TABLET IN THE EVENING. 135 tablet 3   magnesium oxide (MAG-OX) 400 MG tablet Take 1 tablet (400 mg total) by mouth 2 (two) times daily. Please make overdue appt with Dr. Graciela HusbandsKlein before anymore refills. Thank you 1st attempt 60 tablet 0   metFORMIN (GLUCOPHAGE) 500 MG tablet Take 500 mg by mouth 2 (two) times daily with a meal.     NITROSTAT 0.4 MG SL tablet PLACE 1 TABLET UNDER THE TONGUE EVERY 5 MINUTES AS NEEDED FOR CHEST PAIN FOR UP TO 3 DOSES. 25 tablet 3   ONETOUCH VERIO test strip 1 each by Other route  as needed for other.   6   rosuvastatin (CRESTOR) 20 MG tablet Take 1 tablet by mouth once a week.     spironolactone (ALDACTONE) 25 MG tablet TAKE 1/2 TABLET (12.5 MG TOTAL) BY MOUTH DAILY. 45 tablet 3   No current facility-administered medications for this visit.     Past Medical History:  Diagnosis Date   AICD (automatic cardioverter/defibrillator) present    Anemia    Arthritis    "mild in my joints & knuckles" (05/10/2016)   CHF (congestive heart failure) (HCC)    Coronary artery disease    Hemochromatosis    Hyperlipidemia    Ischemic cardiomyopathy    EF 36%; prior anterior MI, s/p CABG x 3; s/p cath Feb 2013 showing grafts to be patent with severe LV dysfunction   Myocardial infarction (HCC) 2006   PAF (paroxysmal atrial fibrillation) (HCC)    Pneumonia 2000   RBBB (right bundle branch block)    Sleep apnea    "can't tolerate mask" (05/10/2016)   Type II diabetes mellitus (HCC)    Ventricular tachycardia, 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    ROS:   All systems reviewed and negative except as noted in the HPI.   Past Surgical History:  Procedure  Laterality Date   BI-VENTRICULAR IMPLANTABLE CARDIOVERTER DEFIBRILLATOR N/A 08/21/2011   Procedure: BI-VENTRICULAR IMPLANTABLE CARDIOVERTER DEFIBRILLATOR  (CRT-D);  Surgeon: Duke Salvia, MD;  Location: [facility] CATH LAB;  Service: Cardiovascular;  Laterality: N/A;   BIV ICD GENERTAOR CHANGE OUT N/A 06/12/2014   Procedure: BIV ICD GENERTAOR CHANGE OUT;  Surgeon: Duke Salvia, MD;  Location: [facility] CATH LAB;  Service: Cardiovascular;  Laterality: N/A;   CARDIAC CATHETERIZATION N/A 01/19/2016   Procedure: Left Heart Cath and Cors/Grafts Angiography;  Surgeon: Peter M Swaziland, MD;  Location: [facility] INVASIVE CV LAB;  Service: Cardiovascular;  Laterality: N/A;   CORONARY ARTERY BYPASS GRAFT  2006   Emergent CABG x 3 with LIMA to LAD, SVG to OM, SVG to distal RCA per Dr. Tyrone Sage   FRACTURE SURGERY     INGUINAL HERNIA REPAIR  02/09/2012   Procedure:  HERNIA REPAIR INGUINAL ADULT;  Surgeon: Cherylynn Ridges, MD;  Location: [facility] OR;  Service: General;  Laterality: Right;   INGUINAL HERNIA REPAIR Bilateral [phone]   INSERTION OF MESH  02/09/2012   Procedure: INSERTION OF MESH;  Surgeon: Cherylynn Ridges, MD;  Location: [facility] OR;  Service: General;  Laterality: Right;   LEFT HEART CATHETERIZATION WITH CORONARY/GRAFT ANGIOGRAM N/A 05/25/2011   Procedure: LEFT HEART CATHETERIZATION WITH Isabel Caprice;  Surgeon: Peter M Swaziland, MD;  Location: [facility] CATH LAB;  Service: Cardiovascular;  Laterality: N/A;   LIVER BIOPSY  1990s   SHOULDER OPEN ROTATOR CUFF REPAIR Bilateral ?2000/~ 2007   left; right    V TACH ABLATION N/A 06/08/2016   Procedure: V Tach Ablation;  Surgeon: Hillis Range, MD;  Location: [facility] INVASIVE CV LAB;  Service: Cardiovascular;  Laterality: N/A;   VENTRICULAR ABLATION SURGERY  06/08/2016   WRIST FRACTURE SURGERY Left 06/2005   "plates and screws and cadavaer bone"     Family History  Adopted: Yes     Social History   Socioeconomic History   Marital status: Married    Spouse name: Not on file   Number of children: 1   Years of education: Not on file   Highest education level: Not on file  Occupational History   Occupation: telephone service  Tobacco Use   Smoking status: Former    Packs/day: 2.00    Years: 30.00    Additional pack years: 0.00    Total pack years: 60.00    Types: Cigarettes    Quit date: 03/11/2005    Years since quitting: 17.3   Smokeless tobacco: Never  Vaping Use   Vaping Use: Never used  Substance and Sexual Activity   Alcohol use: Yes    Alcohol/week: 3.0 standard drinks of alcohol    Types: 3 Cans of beer per week   Drug use: No   Sexual activity: Not Currently  Other Topics Concern   Not on file  Social History Narrative   Not on file   Social Determinants of Health   Financial Resource Strain: Not on file  Food Insecurity: Not on file  Transportation Needs: Not on file  Physical  Activity: Not on file  Stress: Not on file  Social Connections: Not on file  Intimate Partner Violence: Not on file     BP 110/64   Pulse 83   Ht 6' (1.829 m)   Wt 211 lb (95.7 kg)   SpO2 96%   BMI 28.62 kg/m   Physical Exam:  Well appearing NAD HEENT: Unremarkable Neck:  No JVD, no thyromegally Lymphatics:  No adenopathy  Back:  No CVA tenderness Lungs:  Clear HEART:  Regular rate rhythm, no murmurs, no rubs, no clicks Abd:  soft, positive bowel sounds, no organomegally, no rebound, no guarding Ext:  2 plus pulses, no edema, no cyanosis, no clubbing Skin:  No rashes no nodules Neuro:  CN II through XII intact, motor grossly intact  EKG  DEVICE  Normal device function.  See PaceArt for details.   Assess/Plan:  Recurrent VT - I discussed the treatment options.  He has only had 7 sustained episodes requiring ATP alone in the last 3 months. I would recommend watchful waiting. I might consider a retrial of sotalol if we can work on resynchroniztion. Chronic systolic heart failure - he has class 2 symptoms. I have recommended upgrading and try to perform left bundle area pacing. 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 CAD, s/p CABG - he is s/p MI but denies any anginal symptoms  [name]

## 2022-07-24 ENCOUNTER — Encounter: Payer: Self-pay | Admitting: *Deleted

## 2022-07-24 ENCOUNTER — Telehealth: Payer: Self-pay | Admitting: *Deleted

## 2022-07-24 DIAGNOSIS — I5022 Chronic systolic (congestive) heart failure: Secondary | ICD-10-CM

## 2022-07-24 DIAGNOSIS — Z01812 Encounter for preprocedural laboratory examination: Secondary | ICD-10-CM

## 2022-07-24 NOTE — Telephone Encounter (Signed)
Called pt to review upcoming procedure scheduled for 5/7. Pt will stop by the office on 4/22 for needed lab work. Reviewed instructions with pt, aware will send via mychart. Pt aware that plan is to keep him for a couple days for Sotalol start.  Pt agreeable to plan.

## 2022-07-31 ENCOUNTER — Ambulatory Visit: Payer: Medicare Other | Attending: Internal Medicine

## 2022-07-31 DIAGNOSIS — Z01812 Encounter for preprocedural laboratory examination: Secondary | ICD-10-CM

## 2022-07-31 DIAGNOSIS — I5022 Chronic systolic (congestive) heart failure: Secondary | ICD-10-CM

## 2022-08-01 LAB — BASIC METABOLIC PANEL
BUN/Creatinine Ratio: 20 (ref 10–24)
BUN: 41 mg/dL — ABNORMAL HIGH (ref 8–27)
CO2: 18 mmol/L — ABNORMAL LOW (ref 20–29)
Calcium: 10 mg/dL (ref 8.6–10.2)
Chloride: 100 mmol/L (ref 96–106)
Creatinine, Ser: 2.03 mg/dL — ABNORMAL HIGH (ref 0.76–1.27)
Glucose: 170 mg/dL — ABNORMAL HIGH (ref 70–99)
Potassium: 4.4 mmol/L (ref 3.5–5.2)
Sodium: 138 mmol/L (ref 134–144)
eGFR: 35 mL/min/{1.73_m2} — ABNORMAL LOW (ref >59)

## 2022-08-01 LAB — CBC
Hematocrit: 39.5 % (ref 37.5–51.0)
Hemoglobin: 13.6 g/dL (ref 13.0–17.7)
MCH: 34.3 pg — ABNORMAL HIGH (ref 26.6–33.0)
MCHC: 34.4 g/dL (ref 31.5–35.7)
MCV: 100 fL — ABNORMAL HIGH (ref 79–97)
Platelets: 164 10*3/uL (ref 150–450)
RBC: 3.97 x10E6/uL — ABNORMAL LOW (ref 4.14–5.80)
RDW: 11.5 % — ABNORMAL LOW (ref 11.6–15.4)
WBC: 3.9 10*3/uL (ref 3.4–10.8)

## 2022-08-02 NOTE — Progress Notes (Signed)
Remote ICD transmission.   

## 2022-08-14 NOTE — Pre-Procedure Instructions (Signed)
Attempted to call patient regarding procedure instructions for tomorrow.  Left voicemail on the following items: Arrival time 0700 Nothing to eat or drink after midnight No meds AM of procedure Responsible person to drive you home and stay with you for 24 hrs Wash with special soap night before and morning of procedure

## 2022-08-15 ENCOUNTER — Other Ambulatory Visit: Payer: Self-pay

## 2022-08-15 ENCOUNTER — Ambulatory Visit (HOSPITAL_COMMUNITY)
Admission: RE | Disposition: A | Discharge status: Home / Self Care | Payer: Self-pay | Source: Home / Self Care | Attending: Internal Medicine

## 2022-08-15 ENCOUNTER — Ambulatory Visit (HOSPITAL_COMMUNITY)
Admission: RE | Admit: 2022-08-15 | Discharge: 2022-08-16 | Disposition: A | Discharge status: Home / Self Care | Payer: Medicare Other | Attending: Internal Medicine | Admitting: Internal Medicine

## 2022-08-15 ENCOUNTER — Encounter (HOSPITAL_COMMUNITY): Payer: Self-pay | Admitting: Internal Medicine

## 2022-08-15 DIAGNOSIS — I4729 Other ventricular tachycardia: Secondary | ICD-10-CM | POA: Insufficient documentation

## 2022-08-15 DIAGNOSIS — Y839 Surgical procedure, unspecified as the cause of abnormal reaction of the patient, or of later complication, without mention of misadventure at the time of the procedure: Secondary | ICD-10-CM | POA: Diagnosis not present

## 2022-08-15 DIAGNOSIS — Z4502 Encounter for adjustment and management of automatic implantable cardiac defibrillator: Secondary | ICD-10-CM | POA: Insufficient documentation

## 2022-08-15 DIAGNOSIS — T82190A Other mechanical complication of cardiac electrode, initial encounter: Secondary | ICD-10-CM | POA: Diagnosis not present

## 2022-08-15 DIAGNOSIS — I2510 Atherosclerotic heart disease of native coronary artery without angina pectoris: Secondary | ICD-10-CM | POA: Diagnosis not present

## 2022-08-15 DIAGNOSIS — I252 Old myocardial infarction: Secondary | ICD-10-CM | POA: Diagnosis not present

## 2022-08-15 DIAGNOSIS — Z951 Presence of aortocoronary bypass graft: Secondary | ICD-10-CM | POA: Insufficient documentation

## 2022-08-15 DIAGNOSIS — Z87891 Personal history of nicotine dependence: Secondary | ICD-10-CM | POA: Insufficient documentation

## 2022-08-15 DIAGNOSIS — I5022 Chronic systolic (congestive) heart failure: Secondary | ICD-10-CM | POA: Insufficient documentation

## 2022-08-15 DIAGNOSIS — Z79899 Other long term (current) drug therapy: Secondary | ICD-10-CM | POA: Diagnosis not present

## 2022-08-15 DIAGNOSIS — I472 Ventricular tachycardia, unspecified: Secondary | ICD-10-CM | POA: Diagnosis not present

## 2022-08-15 HISTORY — PX: BIV ICD INSERTION CRT-D: EP1195

## 2022-08-15 LAB — GLUCOSE, CAPILLARY: Glucose-Capillary: 134 mg/dL — ABNORMAL HIGH (ref 70–99)

## 2022-08-15 SURGERY — BIV ICD INSERTION CRT-D

## 2022-08-15 MED ORDER — CARVEDILOL 25 MG PO TABS
25.0000 mg | ORAL_TABLET | Freq: Two times a day (BID) | ORAL | Status: DC
  Administered 2022-08-15 – 2022-08-16 (×2): 25 mg via ORAL
  Filled 2022-08-15 (×2): qty 1

## 2022-08-15 MED ORDER — ACETAMINOPHEN 325 MG PO TABS
325.0000 mg | ORAL_TABLET | ORAL | Status: DC | PRN
  Administered 2022-08-15: 650 mg via ORAL
  Filled 2022-08-15: qty 2

## 2022-08-15 MED ORDER — FENTANYL CITRATE (PF) 100 MCG/2ML IJ SOLN
INTRAMUSCULAR | Status: AC
  Filled 2022-08-15: qty 2

## 2022-08-15 MED ORDER — ONDANSETRON HCL 4 MG/2ML IJ SOLN: 4.0000 mg | Freq: Four times a day (QID) | INTRAMUSCULAR | Status: DC | PRN

## 2022-08-15 MED ORDER — SODIUM CHLORIDE 0.9 % IV SOLN
INTRAVENOUS | Status: AC
  Filled 2022-08-15: qty 2

## 2022-08-15 MED ORDER — MIDAZOLAM HCL 5 MG/5ML IJ SOLN
INTRAMUSCULAR | Status: DC | PRN
  Administered 2022-08-15 (×4): 1 mg via INTRAVENOUS

## 2022-08-15 MED ORDER — CEFAZOLIN SODIUM-DEXTROSE 1-4 GM/50ML-% IV SOLN
1.0000 g | Freq: Four times a day (QID) | INTRAVENOUS | Status: AC
  Administered 2022-08-15 – 2022-08-16 (×3): 1 g via INTRAVENOUS
  Filled 2022-08-15 (×3): qty 50

## 2022-08-15 MED ORDER — FLUTICASONE PROPIONATE 50 MCG/ACT NA SUSP: 1.0000 | Freq: Every day | NASAL | Status: DC | PRN

## 2022-08-15 MED ORDER — LIDOCAINE HCL (PF) 1 % IJ SOLN
INTRAMUSCULAR | Status: AC
  Filled 2022-08-15: qty 30

## 2022-08-15 MED ORDER — ASPIRIN 81 MG PO TABS: 81.0000 mg | ORAL_TABLET | Freq: Every day | ORAL | Status: DC

## 2022-08-15 MED ORDER — MIDAZOLAM HCL 2 MG/2ML IJ SOLN
INTRAMUSCULAR | Status: AC
  Filled 2022-08-15: qty 2

## 2022-08-15 MED ORDER — HEPARIN (PORCINE) IN NACL 1000-0.9 UT/500ML-% IV SOLN
INTRAVENOUS | Status: DC | PRN
  Administered 2022-08-15 (×2): 500 mL

## 2022-08-15 MED ORDER — LIDOCAINE HCL (PF) 1 % IJ SOLN
INTRAMUSCULAR | Status: DC | PRN
  Administered 2022-08-15: 50 mL

## 2022-08-15 MED ORDER — CEFAZOLIN SODIUM-DEXTROSE 2-4 GM/100ML-% IV SOLN
INTRAVENOUS | Status: AC
  Administered 2022-08-15: 2 g via INTRAVENOUS
  Filled 2022-08-15: qty 100

## 2022-08-15 MED ORDER — SODIUM CHLORIDE 0.9 % IV SOLN
80.0000 mg | INTRAVENOUS | Status: AC
  Administered 2022-08-15: 80 mg

## 2022-08-15 MED ORDER — LIDOCAINE HCL (PF) 1 % IJ SOLN
INTRAMUSCULAR | Status: AC
  Filled 2022-08-15: qty 60

## 2022-08-15 MED ORDER — FENTANYL CITRATE (PF) 100 MCG/2ML IJ SOLN
INTRAMUSCULAR | Status: DC | PRN
  Administered 2022-08-15 (×2): 12.5 ug via INTRAVENOUS
  Administered 2022-08-15: 25 ug via INTRAVENOUS
  Administered 2022-08-15: 12.5 ug via INTRAVENOUS

## 2022-08-15 MED ORDER — ROSUVASTATIN CALCIUM 20 MG PO TABS: 20.0000 mg | ORAL_TABLET | ORAL | Status: DC

## 2022-08-15 MED ORDER — SPIRONOLACTONE 12.5 MG HALF TABLET
12.5000 mg | ORAL_TABLET | Freq: Every day | ORAL | Status: DC
  Administered 2022-08-15 – 2022-08-16 (×2): 12.5 mg via ORAL
  Filled 2022-08-15 (×2): qty 1

## 2022-08-15 MED ORDER — CHLORHEXIDINE GLUCONATE 4 % EX SOLN: 4.0000 | Freq: Once | CUTANEOUS | Status: DC

## 2022-08-15 MED ORDER — IODIXANOL 320 MG/ML IV SOLN
INTRAVENOUS | Status: DC | PRN
  Administered 2022-08-15: 15 mL
  Administered 2022-08-15: 10 mL

## 2022-08-15 MED ORDER — ASPIRIN 81 MG PO TBEC
81.0000 mg | DELAYED_RELEASE_TABLET | Freq: Every day | ORAL | Status: DC
  Administered 2022-08-15 – 2022-08-16 (×2): 81 mg via ORAL
  Filled 2022-08-15 (×2): qty 1

## 2022-08-15 MED ORDER — SODIUM CHLORIDE 0.9 % IV SOLN: INTRAVENOUS | Status: DC

## 2022-08-15 MED ORDER — FUROSEMIDE 40 MG PO TABS
40.0000 mg | ORAL_TABLET | Freq: Every day | ORAL | Status: DC
  Administered 2022-08-15 – 2022-08-16 (×2): 40 mg via ORAL
  Filled 2022-08-15 (×2): qty 1

## 2022-08-15 MED ORDER — CEFAZOLIN SODIUM-DEXTROSE 2-4 GM/100ML-% IV SOLN: 2.0000 g | INTRAVENOUS | Status: AC

## 2022-08-15 SURGICAL SUPPLY — 18 items
BALLN ATTAIN 80 (BALLOONS) ×1
BALLOON ATTAIN 80 (BALLOONS) IMPLANT
CABLE SURGICAL S-101-97-12 (CABLE) ×2 IMPLANT
CATH ATTAIN COM SURV 6250V-MB2 (CATHETERS) IMPLANT
CATH JOSEPH QUAD ALLRED 6F REP (CATHETERS) IMPLANT
ICD COBALT XT QUAD CRT DTPA2Q1 (ICD Generator) IMPLANT
KIT LEAD END CAP (Cap) IMPLANT
KIT MICROPUNCTURE NIT STIFF (SHEATH) IMPLANT
LEAD ATTAIN PERFORMA S 4598-88 (Lead) IMPLANT
PAD DEFIB RADIO PHYSIO CONN (PAD) ×2 IMPLANT
POUCH AIGIS-R ANTIBACT ICD (Mesh General) ×1 IMPLANT
POUCH AIGIS-R ANTIBACT ICD LRG (Mesh General) IMPLANT
SHEATH 7FR PRELUDE SNAP 13 (SHEATH) IMPLANT
SHEATH 9FR PRELUDE SNAP 13 (SHEATH) IMPLANT
SLITTER 6232ADJ (MISCELLANEOUS) IMPLANT
TRAY PACEMAKER INSERTION (PACKS) ×2 IMPLANT
WIRE ACUITY WHISPER EDS 4648 (WIRE) IMPLANT
WIRE AMPLATZ SS-J .035X180CM (WIRE) IMPLANT

## 2022-08-15 NOTE — Interval H&P Note (Signed)
History and Physical Interval Note:  08/15/2022 10:03 AM  [name]  has presented today for surgery, with the diagnosis of heart failure.  The various methods of treatment have been discussed with the patient and family. After consideration of risks, benefits and other options for treatment, the patient has consented to  Procedure(s): BIV ICD INSERTION CRT-D (N/A) as a surgical intervention.  The patient's history has been reviewed, patient examined, no change in status, stable for surgery.  I have reviewed the patient's chart and labs.  Questions were answered to the patient's satisfaction.     [name]

## 2022-08-16 ENCOUNTER — Other Ambulatory Visit: Payer: Self-pay

## 2022-08-16 ENCOUNTER — Other Ambulatory Visit (HOSPITAL_COMMUNITY): Payer: Self-pay

## 2022-08-16 ENCOUNTER — Ambulatory Visit (HOSPITAL_COMMUNITY): Payer: Medicare Other

## 2022-08-16 DIAGNOSIS — I472 Ventricular tachycardia, unspecified: Secondary | ICD-10-CM

## 2022-08-16 DIAGNOSIS — T82190A Other mechanical complication of cardiac electrode, initial encounter: Secondary | ICD-10-CM | POA: Diagnosis not present

## 2022-08-16 DIAGNOSIS — Z4502 Encounter for adjustment and management of automatic implantable cardiac defibrillator: Secondary | ICD-10-CM | POA: Diagnosis not present

## 2022-08-16 DIAGNOSIS — I4729 Other ventricular tachycardia: Secondary | ICD-10-CM | POA: Diagnosis not present

## 2022-08-16 DIAGNOSIS — I5022 Chronic systolic (congestive) heart failure: Secondary | ICD-10-CM

## 2022-08-16 DIAGNOSIS — Z95810 Presence of automatic (implantable) cardiac defibrillator: Secondary | ICD-10-CM

## 2022-08-16 LAB — BASIC METABOLIC PANEL
Anion gap: 12 (ref 5–15)
BUN: 31 mg/dL — ABNORMAL HIGH (ref 8–23)
CO2: 24 mmol/L (ref 22–32)
Calcium: 9.6 mg/dL (ref 8.9–10.3)
Chloride: 100 mmol/L (ref 98–111)
Creatinine, Ser: 1.82 mg/dL — ABNORMAL HIGH (ref 0.61–1.24)
GFR, Estimated: 40 mL/min — ABNORMAL LOW (ref >60)
Glucose, Bld: 295 mg/dL — ABNORMAL HIGH (ref 70–99)
Potassium: 3.8 mmol/L (ref 3.5–5.1)
Sodium: 136 mmol/L (ref 135–145)

## 2022-08-16 LAB — MAGNESIUM: Magnesium: 1.3 mg/dL — ABNORMAL LOW (ref 1.7–2.4)

## 2022-08-16 MED ORDER — MAGNESIUM SULFATE 2 GM/50ML IV SOLN
2.0000 g | Freq: Once | INTRAVENOUS | Status: AC
  Administered 2022-08-16: 2 g via INTRAVENOUS
  Filled 2022-08-16: qty 50

## 2022-08-16 MED ORDER — POTASSIUM CHLORIDE CRYS ER 20 MEQ PO TBCR
20.0000 meq | EXTENDED_RELEASE_TABLET | Freq: Once | ORAL | Status: AC
  Administered 2022-08-16: 20 meq via ORAL
  Filled 2022-08-16: qty 1

## 2022-08-16 MED ORDER — SOTALOL HCL 160 MG PO TABS
160.0000 mg | ORAL_TABLET | Freq: Every day | ORAL | 6 refills | Status: DC
  Filled 2022-08-16: qty 30, 30d supply, fill #0

## 2022-08-16 MED ORDER — SOTALOL HCL 80 MG PO TABS
160.0000 mg | ORAL_TABLET | Freq: Every day | ORAL | Status: DC
  Administered 2022-08-16: 160 mg via ORAL
  Filled 2022-08-16: qty 2

## 2022-08-16 MED ORDER — ACETAMINOPHEN 325 MG PO TABS: 325.0000 mg | ORAL_TABLET | ORAL | Status: DC | PRN

## 2022-08-16 MED FILL — Midazolam HCl Inj 2 MG/2ML (Base Equivalent): INTRAMUSCULAR | Qty: 4 | Status: AC

## 2022-08-16 NOTE — Progress Notes (Signed)
Discharge instructions reviewed with pt and his wife. Copy of instructions given to pt. [facility] pharmacy filled sotalol script and will be picked up on the way out for discharge. Pt verbalizes understanding of all instructions and how to care for his ICD/incision.  Pt d/c'd via wheelchair with belongings, with wife.           Escorted by staff.   Annice Needy, RN SWOT

## 2022-08-16 NOTE — Discharge Instructions (Signed)
After Your ICD (Implantable Cardiac Defibrillator)   You have a [employer]onic ICD  ACTIVITY Do not lift your arm above shoulder height for 1 week after your procedure. After 7 days, you may progress as below.  You should remove your sling 24 hours after your procedure, unless otherwise instructed by your provider.     Wednesday Aug 23, 2022  Thursday Aug 24, 2022 Friday Aug 25, 2022 Saturday Aug 26, 2022   Do not lift, push, pull, or carry anything over 10 pounds with the affected arm until 6 weeks (Wednesday September 27, 2022 ) after your procedure.   You may drive AFTER your wound check, unless you have been told otherwise by your provider.   Ask your healthcare provider when you can go back to work   INCISION/Dressing If you are on a blood thinner such as Coumadin, Xarelto, Eliquis, Plavix, or Pradaxa please confirm with your provider when this should be resumed.   If large square, outer bandage is left in place, this can be removed after 24 hours from your procedure. Do not remove steri-strips or glue as below.   Monitor your defibrillator site for redness, swelling, and drainage. Call the device clinic at [phone] if you experience these symptoms or fever/chills.  If your incision is sealed with Steri-strips or staples, you may shower 7 days after your procedure or when told by your provider. Do not remove the steri-strips or let the shower hit directly on your site. You may wash around your site with soap and water.    If you were discharged in a sling, please do not wear this during the day more than 48 hours after your surgery unless otherwise instructed. This may increase the risk of stiffness and soreness in your shoulder.   Avoid lotions, ointments, or perfumes over your incision until it is well-healed.  You may use a hot tub or a pool AFTER your wound check appointment if the incision is completely closed.  Your ICD is designed to protect you from life threatening heart  rhythms. Because of this, you may receive a shock.   1 shock with no symptoms:  Call the office during business hours. 1 shock with symptoms (chest pain, chest pressure, dizziness, lightheadedness, shortness of breath, overall feeling unwell):  Call 911. If you experience 2 or more shocks in 24 hours:  Call 911. If you receive a shock, you should not drive for 6 months per the [place] DMV IF you receive appropriate therapy from your ICD.   ICD Alerts:  Some alerts are vibratory and others beep. These are NOT emergencies. Please call our office to let us know. If this occurs at night or on weekends, it can wait until the next business day. Send a remote transmission.  If your device is capable of reading fluid status (for heart failure), you will be offered monthly monitoring to review this with you.   DEVICE MANAGEMENT Remote monitoring is used to monitor your ICD from home. This monitoring is scheduled every 91 days by our office. It allows [place] to keep an eye on the functioning of your device to ensure it is working properly. You will routinely see your Electrophysiologist annually (more often if necessary).   You should receive your ID card for your new device in 4-8 weeks. Keep this card with you at all times once received. Consider wearing a medical alert bracelet or necklace.  Your ICD  may be MRI compatible. This will be discussed at your next  office visit/wound check.  You should avoid contact with strong electric or magnetic fields.   Do not use amateur (ham) radio equipment or electric (arc) welding torches. MP3 player headphones with magnets should not be used. Some devices are safe to use if held at least 12 inches (30 cm) from your defibrillator. These include power tools, lawn mowers, and speakers. If you are unsure if something is safe to use, ask your health care provider.  When using your cell phone, hold it to the ear that is on the opposite side from the defibrillator. Do not leave  your cell phone in a pocket over the defibrillator.  You may safely use electric blankets, heating pads, computers, and microwave ovens.  Call the office right away if: You have chest pain. You feel more than one shock. You feel more short of breath than you have felt before. You feel more light-headed than you have felt before. Your incision starts to open up.  This information is not intended to replace advice given to you by your health care provider. Make sure you discuss any questions you have with your health care provider.

## 2022-08-16 NOTE — TOC Transition Note (Signed)
Transition of Care [facility]) - CM/SW Discharge Note   Patient Details  Name: [name] MRN: [id number] Date of Birth: 04/07/1956  [facility] CM/SW Contact:  Leone Haven, RN Phone Number: 08/16/2022, 2:29 PM   Clinical Narrative:    Patient is for dc today, TOC to do meds.  He has no other needs.         Patient Goals and CMS Choice      Discharge Placement                         Discharge Plan and Services Additional resources added to the After Visit Summary for                                       Social Determinants of Health (SDOH) Interventions SDOH Screenings   Food Insecurity: No Food Insecurity (08/15/2022)  Housing: Low Risk  (08/15/2022)  Transportation Needs: No Transportation Needs (08/15/2022)  Utilities: Not At Risk (08/15/2022)  Tobacco Use: Medium Risk (08/15/2022)     Readmission Risk Interventions     No data to display

## 2022-08-16 NOTE — Progress Notes (Signed)
  Potassium3.8 (05/08 9528) Magnesium  1.3* (05/08 0926) Creatinine, ser  1.82* (05/08 4132)  Reviewed labs with Dr. Ladona Ridgel Will give 20 meq K 2 g of Mg.     Given pt has ICD in place, we will proceed with sotalol dose this am after Mg is started, with EKG following in 2 hours.   Casimiro Needle [address] [place], PA-C  08/16/2022 11:00 AM

## 2022-08-16 NOTE — Progress Notes (Signed)
  Device site stable.   Plan for Sotalol dose this am pending labs -> then post dose EKG x 2 hours later.   If QT stable could go home with recheck labs/EKG on Friday since ICD is in place per Dr. Ladona Ridgel. Pt qualifies for daily dosing.   CXR pending.   Casimiro Needle [address]" [place], PA-C  08/16/2022 8:54 AM

## 2022-08-16 NOTE — Discharge Summary (Addendum)
ELECTROPHYSIOLOGY PROCEDURE DISCHARGE SUMMARY    Patient ID: [name],  MRN: [id number], DOB/AGE: February 29, 1956 67 y.o.  Admit date: 08/15/2022 Discharge date: 08/16/2022  Primary Care Physician: Geoffry Paradise, MD  Primary Cardiologist: Peter Swaziland, MD  Electrophysiologist: Dr. Graciela Husbands  -> Dr. Ladona Ridgel  Primary Diagnosis:  Chronic systolic CHF  LV lead failure Recurrent VT  Secondary Diagnosis: CAD s/p CABG Hypomagnesemia  Allergies  Allergen Reactions   Entresto [Sacubitril-Valsartan] Other (See Comments)    Hypotension, increased heart rate, dizziness, blurry vision   Linagliptin-Metformin Hcl     chest pain, GI upset   Amphotericin B Lipid Complex [Amphotericin B]     MYALGIAS   Vytorin [Ezetimibe-Simvastatin]     MYALGIAS   Evolocumab      Elevated BS   Statins Other (See Comments)    Myalgias      Procedures This Admission:  1.  Revision of a Medtronic BiV ICD on 5/7 by Dr. Ladona Ridgel.  The patient received a Medtronic Cobalt XT Quad Crt [id number] with previous right atrial lead and right ventricular leads. A LV lead with elevated threshold and diaphragmatic stim was capped and abandoned and a new MDT Lead Attain Performa S [id number] LV lead was implanted. DFTs were deferred at time of implant There were no post procedure complications  2.  CXR on 08/16/22 demonstrated no pneumothorax status post device implantation.      Brief HPI: [name] is a 67 y.o. male was followed in the outpatient setting for management of his VT and ICD. Past medical history includes above.  The patient has persistent LV dysfunction despite guideline directed therapy.  Pt has history of BIV-Risks, benefits, and alternatives to ICD implantation were reviewed with the patient who wished to proceed.   Hospital Course:  The patient was admitted and underwent implantation of a Medtronic BiV ICD with details as outlined above. They were monitored on telemetry overnight which  demonstrated appropriate pacing .  Left chest was without hematoma or ecchymosis.  The device was interrogated and found to be functioning normally.  CXR was obtained and demonstrated no pneumothorax status post device implantation..  Wound care, arm mobility, and restrictions were reviewed with the patient.   In the setting of VT, sotalol was re-started. QT after first dose was stable ~460 ms when measured manually. Discussed with Dr. Ladona Ridgel and will d/c home with recheck labs and EKG on Friday.   Pt has been on magnesium at home but has had intermittent diarrhea that has improved after decreased from 400 mg BID to 400 mg daily.  Will recheck Mg on Friday for further direction on what to do with supplementation.   The patient was examined and considered stable for discharge to home.   The patient's discharge medications include an ACE-I/ARB/ARNI (lisinopril) and beta blocker (coreg).   Anticoagulation resumption This patient is not on anticoagulation.  Physical Exam: Vitals:   08/15/22 1529 08/15/22 1948 08/16/22 0508 08/16/22 1310  BP: 123/66 (!) 108/59 107/63 (!) 96/52  Pulse:  70 61 65  Resp:  18 18 18   Temp:  98.4 F (36.9 C) 98.5 F (36.9 C) 98.4 F (36.9 C)  TempSrc:  Oral Oral Oral  SpO2:  92% 91% 93%  Weight:   94.1 kg   Height:        GEN- NAD. A&O x 3.  HEENT: Normocephalic, atraumatic Lungs- CTAB, normal effort.  Heart- RRR. No M/G/R.  GI- Soft, NT, ND.  Extremities- No  clubbing, cyanosis, or edema Skin- Warm and dry, no rash or lesion. ICD site   Discharge Medications:  Allergies as of 08/16/2022       Reactions   Entresto [sacubitril-valsartan] Other (See Comments)   Hypotension, increased heart rate, dizziness, blurry vision   Linagliptin-metformin Hcl    chest pain, GI upset   Amphotericin B Lipid Complex [amphotericin B]    MYALGIAS   Vytorin [ezetimibe-simvastatin]    MYALGIAS   Evolocumab     Elevated BS   Statins Other (See Comments)   Myalgias         Medication List     TAKE these medications    acetaminophen 325 MG tablet Commonly known as: TYLENOL Take 1-2 tablets (325-650 mg total) by mouth every 4 (four) hours as needed for mild pain.   ALPRAZolam 0.25 MG tablet Commonly known as: XANAX Take 0.25 mg by mouth at bedtime.   aspirin 81 MG tablet Take 81 mg by mouth daily.   carvedilol 25 MG tablet Commonly known as: COREG TAKE 1 TABLET BY MOUTH 2 TIMES DAILY WITH A MEAL.   fenofibrate 160 MG tablet TAKE 1 TABLET BY MOUTH DAILY.   fluticasone 50 MCG/ACT nasal spray Commonly known as: FLONASE Place 1 spray into both nostrils daily as needed for allergies or rhinitis.   furosemide 40 MG tablet Commonly known as: LASIX Take 1 tablet (40 mg total) by mouth daily.   lisinopril 10 MG tablet Commonly known as: ZESTRIL TAKE 1 TABLET BY MOUTH IN THE MORNING AND 1/2 TABLET IN THE EVENING.   magnesium oxide 400 MG tablet Commonly known as: MAG-OX Take 1 tablet (400 mg total) by mouth 2 (two) times daily. Please make overdue appt with Dr. Graciela Husbands before anymore refills. Thank you 1st attempt What changed: when to take this   metFORMIN 500 MG tablet Commonly known as: GLUCOPHAGE Take 1,000 mg by mouth 2 (two) times daily with a meal.   Nitrostat 0.4 MG SL tablet Generic drug: nitroGLYCERIN PLACE 1 TABLET UNDER THE TONGUE EVERY 5 MINUTES AS NEEDED FOR CHEST PAIN FOR UP TO 3 DOSES.   OneTouch Verio test strip Generic drug: glucose blood 1 each by Other route as needed for other.   rosuvastatin 20 MG tablet Commonly known as: CRESTOR Take 1 tablet by mouth every Sunday.   sotalol 160 MG tablet Commonly known as: BETAPACE Take 1 tablet (160 mg total) by mouth daily. Start taking on: Aug 17, 2022   spironolactone 25 MG tablet Commonly known as: ALDACTONE TAKE 1/2 TABLET (12.5 MG TOTAL) BY MOUTH DAILY.        Disposition:  Discharge Instructions     Diet - low sodium heart healthy   Complete by: As  directed    Increase activity slowly   Complete by: As directed        Follow-up Information     [facility] Follow up on 08/18/2022.   Specialty: Cardiology Why: on 5/10 at 2 pm for post hospital / sotalol follow up Contact information: [address], [address] [id number] mc [place] [address] [phone]                Duration of Discharge Encounter: Greater than 30 minutes including physician time.  Dustin Flock, PA-C  08/16/2022 2:03 PM   EP Attending  Patient seen and examined. Agree with above. The patient is doing well after biv ICD revision with insertion of a new LV  lead. He has been started on sotalol, and will return on Friday for a repeat 12 lead ECG and labs looking at his renal function to help with final dosing. His pocket looks good. No hematoma and CXR shows stable lead position. Interrogation under my direction shows normal Biv ICD function.   Sharlot Gowda Syla Devoss,MD

## 2022-08-18 ENCOUNTER — Ambulatory Visit (INDEPENDENT_AMBULATORY_CARE_PROVIDER_SITE_OTHER): Payer: Medicare Other

## 2022-08-18 ENCOUNTER — Ambulatory Visit: Payer: Medicare Other

## 2022-08-18 VITALS — BP 100/58 | HR 73 | Ht 72.0 in | Wt 208.0 lb

## 2022-08-18 DIAGNOSIS — Z95810 Presence of automatic (implantable) cardiac defibrillator: Secondary | ICD-10-CM

## 2022-08-18 DIAGNOSIS — I472 Ventricular tachycardia, unspecified: Secondary | ICD-10-CM

## 2022-08-18 DIAGNOSIS — I255 Ischemic cardiomyopathy: Secondary | ICD-10-CM

## 2022-08-18 DIAGNOSIS — I5022 Chronic systolic (congestive) heart failure: Secondary | ICD-10-CM

## 2022-08-18 MED ORDER — SOTALOL HCL 160 MG PO TABS: 160.0000 mg | ORAL_TABLET | Freq: Every day | ORAL | 3 refills | Status: DC

## 2022-08-18 NOTE — Progress Notes (Signed)
   Nurse Visit   Date of Encounter: 08/18/2022 ID: [name], DOB 11-18-1955, MRN [id number]  PCP:  Geoffry Paradise, MD   [facility] Providers Cardiologist:  Peter Swaziland, MD     Visit Details   VS:  BP (!) 100/58   Pulse 73   Ht 6' (1.829 m)   Wt 208 lb (94.3 kg)   SpO2 94%   BMI 28.21 kg/m  , BMI Body mass index is 28.21 kg/m.  Wt Readings from Last 3 Encounters:  08/18/22 208 lb (94.3 kg)  08/16/22 207 lb 8 oz (94.1 kg)  07/19/22 211 lb (95.7 kg)     Reason for visit: EKG post ICD implant  Performed today: Vitals, EKG, Provider consulted:Dr. Clifton James (DOD), and Education Changes (medications, testing, etc.) : EKG Length of Visit: 20 minutes    Medications Adjustments/Labs and Tests Ordered: Orders Placed This Encounter  Procedures   EKG 12-Lead   Meds ordered this encounter  Medications   sotalol (BETAPACE) 160 MG tablet    Sig: Take 1 tablet (160 mg total) by mouth daily.    Dispense:  90 tablet    Refill:  3   Patient requested Sotalol script be sent to [facility].   Signed, Eilleen Kempf, RN  08/18/2022 2:35 PM

## 2022-08-18 NOTE — Patient Instructions (Signed)
Medication Instructions:  Your physician recommends that you continue on your current medications as directed. Please refer to the Current Medication list given to you today.  *If you need a refill on your cardiac medications before your next appointment, please call your pharmacy*  Follow-Up: At [facility], you and your health needs are our priority.  As part of our continuing mission to provide you with exceptional heart care, we have created designated Provider Care Teams.  These Care Teams include your primary Cardiologist (physician) and Advanced Practice Providers (APPs -  Physician Assistants and Nurse Practitioners) who all work together to provide you with the care you need, when you need it.   Your next appointment:   As scheduled  Provider:   Dr Taylor   

## 2022-08-19 LAB — BASIC METABOLIC PANEL
BUN/Creatinine Ratio: 15 (ref 10–24)
BUN: 38 mg/dL — ABNORMAL HIGH (ref 8–27)
CO2: 21 mmol/L (ref 20–29)
Calcium: 9.5 mg/dL (ref 8.6–10.2)
Chloride: 99 mmol/L (ref 96–106)
Creatinine, Ser: 2.46 mg/dL — ABNORMAL HIGH (ref 0.76–1.27)
Glucose: 159 mg/dL — ABNORMAL HIGH (ref 70–99)
Potassium: 4.2 mmol/L (ref 3.5–5.2)
Sodium: 139 mmol/L (ref 134–144)
eGFR: 28 mL/min/{1.73_m2} — ABNORMAL LOW (ref >59)

## 2022-08-19 LAB — MAGNESIUM: Magnesium: 1.9 mg/dL (ref 1.6–2.3)

## 2022-08-21 ENCOUNTER — Telehealth: Payer: Self-pay

## 2022-08-21 DIAGNOSIS — Z01812 Encounter for preprocedural laboratory examination: Secondary | ICD-10-CM

## 2022-08-21 DIAGNOSIS — I480 Paroxysmal atrial fibrillation: Secondary | ICD-10-CM

## 2022-08-21 DIAGNOSIS — I255 Ischemic cardiomyopathy: Secondary | ICD-10-CM

## 2022-08-21 DIAGNOSIS — Z95810 Presence of automatic (implantable) cardiac defibrillator: Secondary | ICD-10-CM

## 2022-08-21 NOTE — Telephone Encounter (Signed)
-----   Message from Graciella Freer, PA-C sent at 08/19/2022  8:23 AM EDT ----- AKI noted but CrCl remains appropriate for daily dosing of sotalol.  Needs repeat BMET/Mg in 1 week and follow up with EP APP in 3-4 weeks.

## 2022-08-21 NOTE — Telephone Encounter (Signed)
The patient has been notified of the result and verbalized understanding.  All questions (if any) were answered. Frutoso Schatz, RN 08/21/2022 3:40 PM  Patient has been scheduled for lab work and follow up appointment.

## 2022-08-29 NOTE — Patient Instructions (Incomplete)

## 2022-08-30 ENCOUNTER — Ambulatory Visit: Payer: Medicare Other | Attending: Cardiovascular Disease

## 2022-08-30 ENCOUNTER — Ambulatory Visit: Payer: Medicare Other

## 2022-08-30 DIAGNOSIS — I255 Ischemic cardiomyopathy: Secondary | ICD-10-CM

## 2022-08-30 DIAGNOSIS — I472 Ventricular tachycardia, unspecified: Secondary | ICD-10-CM

## 2022-08-30 DIAGNOSIS — Z95810 Presence of automatic (implantable) cardiac defibrillator: Secondary | ICD-10-CM

## 2022-08-30 DIAGNOSIS — I480 Paroxysmal atrial fibrillation: Secondary | ICD-10-CM

## 2022-08-30 LAB — CUP PACEART INCLINIC DEVICE CHECK
Battery Remaining Longevity: 111 mo
Battery Voltage: 3.04 V
Brady Statistic AP VP Percent: 52.79 %
Brady Statistic AP VS Percent: 0.36 %
Brady Statistic AS VP Percent: 45.58 %
Brady Statistic AS VS Percent: 1.27 %
Brady Statistic RA Percent Paced: 53.46 %
Brady Statistic RV Percent Paced: 98.37 %
Date Time Interrogation Session: 20240522204905
HighPow Impedance: 47 Ohm
Implantable Lead Connection Status: 753985
Implantable Lead Connection Status: 753985
Implantable Lead Connection Status: 753985
Implantable Lead Implant Date: 20130513
Implantable Lead Implant Date: 20130513
Implantable Lead Implant Date: 20240507
Implantable Lead Location: 753858
Implantable Lead Location: 753859
Implantable Lead Location: 753860
Implantable Lead Model: 181
Implantable Lead Model: 4598
Implantable Lead Model: 5076
Implantable Lead Serial Number: 316784
Implantable Pulse Generator Implant Date: 20240507
Lead Channel Impedance Value: 323 Ohm
Lead Channel Impedance Value: 342 Ohm
Lead Channel Impedance Value: 418 Ohm
Lead Channel Impedance Value: 437 Ohm
Lead Channel Impedance Value: 456 Ohm
Lead Channel Impedance Value: 456 Ohm
Lead Channel Impedance Value: 475 Ohm
Lead Channel Impedance Value: 608 Ohm
Lead Channel Impedance Value: 627 Ohm
Lead Channel Impedance Value: 703 Ohm
Lead Channel Impedance Value: 722 Ohm
Lead Channel Impedance Value: 722 Ohm
Lead Channel Impedance Value: 722 Ohm
Lead Channel Pacing Threshold Amplitude: 0.5 V
Lead Channel Pacing Threshold Amplitude: 0.5 V
Lead Channel Pacing Threshold Amplitude: 0.75 V
Lead Channel Pacing Threshold Amplitude: 0.75 V
Lead Channel Pacing Threshold Amplitude: 0.75 V
Lead Channel Pacing Threshold Amplitude: 1 V
Lead Channel Pacing Threshold Pulse Width: 0.4 ms
Lead Channel Pacing Threshold Pulse Width: 0.4 ms
Lead Channel Pacing Threshold Pulse Width: 0.4 ms
Lead Channel Pacing Threshold Pulse Width: 0.4 ms
Lead Channel Pacing Threshold Pulse Width: 0.4 ms
Lead Channel Pacing Threshold Pulse Width: 0.4 ms
Lead Channel Sensing Intrinsic Amplitude: 1.9 mV
Lead Channel Sensing Intrinsic Amplitude: 10.9 mV
Lead Channel Setting Pacing Amplitude: 1.5 V
Lead Channel Setting Pacing Amplitude: 1.75 V
Lead Channel Setting Pacing Amplitude: 2 V
Lead Channel Setting Pacing Pulse Width: 0.4 ms
Lead Channel Setting Pacing Pulse Width: 0.4 ms
Lead Channel Setting Sensing Sensitivity: 0.45 mV
Zone Setting Status: 755011

## 2022-08-30 NOTE — Progress Notes (Signed)
Wound check appointment. Steri-strips removed. Wound without redness or edema. Incision edges approximated, wound well healed; however, there is a small hematoma noted at device site.  Dr. Elberta Fortis reviewed, ordered patient to hold ASA X 1 week.  He is to monitor for any increased in swelling and contact office if does not continue to improve.   Normal device function. BIVP 98.3%. Thresholds, sensing, and impedances consistent with implant measurements. Device programmed at 3.5V for extra safety margin until 3 month visit. Histogram distribution appropriate for patient and level of activity. No mode switches, 4 short NSVT episodes, longest 17 beats. Patient educated about wound care, arm mobility, lifting restrictions, shock plan. ROV in 3 months with implanting physician.  The following PROGRAMMING CHANGES MADE:  1. Updated wavelet with improved match scores 2.  Added atrial EGM view to remote monitoring reports

## 2022-08-31 LAB — BASIC METABOLIC PANEL
BUN/Creatinine Ratio: 20 (ref 10–24)
BUN: 40 mg/dL — ABNORMAL HIGH (ref 8–27)
CO2: 21 mmol/L (ref 20–29)
Calcium: 10.4 mg/dL — ABNORMAL HIGH (ref 8.6–10.2)
Chloride: 101 mmol/L (ref 96–106)
Creatinine, Ser: 2.01 mg/dL — ABNORMAL HIGH (ref 0.76–1.27)
Glucose: 189 mg/dL — ABNORMAL HIGH (ref 70–99)
Potassium: 4.7 mmol/L (ref 3.5–5.2)
Sodium: 139 mmol/L (ref 134–144)
eGFR: 36 mL/min/{1.73_m2} — ABNORMAL LOW (ref >59)

## 2022-08-31 LAB — MAGNESIUM: Magnesium: 1.8 mg/dL (ref 1.6–2.3)

## 2022-09-15 ENCOUNTER — Telehealth: Payer: Self-pay | Admitting: Internal Medicine

## 2022-09-15 NOTE — Telephone Encounter (Signed)
After consulting with pharmacy, advised patient he has the correct medication.  Patient verbalized understanding and had no questions.

## 2022-09-15 NOTE — Telephone Encounter (Signed)
Pt c/o medication issue:  1. Name of Medication: sotalol (BETAPACE) 160 MG tablet   2. How are you currently taking this medication (dosage and times per day)? Not currently taking   3. Are you having a reaction (difficulty breathing--STAT)? No   4. What is your medication issue? States the pharmacy gave him sotalol [facility]. Wants to confirm this is the same as regular sotalol. He has not began taking the medication yet due to just taking his last of the prescription he had from the hospital today.  Please advise.

## 2022-09-15 NOTE — Telephone Encounter (Signed)
Yes, same medication

## 2022-09-23 NOTE — Progress Notes (Addendum)
Cardiology Office Note Date:  09/23/2022  Patient ID:  [name] 1955/05/11, MRN [id number] PCP:  Geoffry Paradise, MD  Cardiologist:  Dr. Swaziland Electrophysiologist: Dr. Thomasene Lot. Ladona Ridgel   Chief Complaint:   sotalol visit  History of Present Illness: [name] is a 67 y.o. male with history of  CAD s/p CABG x 3, type II DM, HLD, chronic systolic HF and ischemic cardiomyopathy,   CRT-D, hemochromatosis, right bundle branch block, OSA, VT  AFib is listed in his problem list though not formally discussed by cardiology/EP  TODAY He feels very well. Since he last saw Dr. Swaziland and had his device changed/new LV lead place, he feels night and day better No ongoing edema, SOB. Really feels well No diaphragmatic stim. No CP No near syncope or syncope.   DEVICE information:  MDT CRT-D, implanted 08/21/11 >> LV nonfunctioning and have been programmed off > 08/15/22 gen change with new LV lead placed his prior LV lead capped and abandoned)   Arrhythmia hx October 2017 + appropriate shocks for VT storm  Jan 2018 VT storm, + appropriate tx Ablation march 2018 March 2020 MMVT successful ATP during charge May 2024 w/recurrent VT  AAD  Sotalol Oct 2017 > stopped Jan 2018 with recurrent VT  Jan 2018 amiodarone, stopped +/- July 2018 ? Pt preference Restarted April 2020 stopped July 2020 2/2 diarrhea/tremor Sotalol restarted May 2024 2/2 recurrent VT   Past Medical History:  Diagnosis Date   AICD (automatic cardioverter/defibrillator) present    Anemia    Arthritis    "mild in my joints & knuckles" (05/10/2016)   CHF (congestive heart failure) (HCC)    Coronary artery disease    Hemochromatosis    Hyperlipidemia    Ischemic cardiomyopathy    EF 36%; prior anterior MI, s/p CABG x 3; s/p cath Feb 2013 showing grafts to be patent with severe LV dysfunction   Myocardial infarction (HCC) 2006   PAF (paroxysmal atrial fibrillation) (HCC)    Pneumonia 2000   RBBB  (right bundle branch block)    Sleep apnea    "can't tolerate mask" (05/10/2016)   Type II diabetes mellitus (HCC)    Ventricular tachycardia, 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    Past Surgical History:  Procedure Laterality Date   BI-VENTRICULAR IMPLANTABLE CARDIOVERTER DEFIBRILLATOR N/A 08/21/2011   Procedure: BI-VENTRICULAR IMPLANTABLE CARDIOVERTER DEFIBRILLATOR  (CRT-D);  Surgeon: Duke Salvia, MD;  Location: [facility] CATH LAB;  Service: Cardiovascular;  Laterality: N/A;   BIV ICD GENERTAOR CHANGE OUT N/A 06/12/2014   Procedure: BIV ICD GENERTAOR CHANGE OUT;  Surgeon: Duke Salvia, MD;  Location: [facility] CATH LAB;  Service: Cardiovascular;  Laterality: N/A;   BIV ICD INSERTION CRT-D N/A 08/15/2022   Procedure: BIV ICD INSERTION CRT-D;  Surgeon: Marinus Maw, MD;  Location: [facility] INVASIVE CV LAB;  Service: Cardiovascular;  Laterality: N/A;   CARDIAC CATHETERIZATION N/A 01/19/2016   Procedure: Left Heart Cath and Cors/Grafts Angiography;  Surgeon: Peter M Swaziland, MD;  Location: [facility] INVASIVE CV LAB;  Service: Cardiovascular;  Laterality: N/A;   CORONARY ARTERY BYPASS GRAFT  2006   Emergent CABG x 3 with LIMA to LAD, SVG to OM, SVG to distal RCA per Dr. Tyrone Sage   FRACTURE SURGERY     INGUINAL HERNIA REPAIR  02/09/2012   Procedure: HERNIA REPAIR INGUINAL ADULT;  Surgeon: Cherylynn Ridges, MD;  Location: [facility] OR;  Service: General;  Laterality: Right;   INGUINAL HERNIA REPAIR Bilateral [phone]   INSERTION OF  MESH  02/09/2012   Procedure: INSERTION OF MESH;  Surgeon: Cherylynn Ridges, MD;  Location: [facility] OR;  Service: General;  Laterality: Right;   LEFT HEART CATHETERIZATION WITH CORONARY/GRAFT ANGIOGRAM N/A 05/25/2011   Procedure: LEFT HEART CATHETERIZATION WITH Isabel Caprice;  Surgeon: Peter M Swaziland, MD;  Location: [facility] CATH LAB;  Service: Cardiovascular;  Laterality: N/A;   LIVER BIOPSY  1990s   SHOULDER OPEN ROTATOR CUFF REPAIR Bilateral ?2000/~ 2007   left; right    V TACH ABLATION N/A 06/08/2016    Procedure: V Tach Ablation;  Surgeon: Hillis Range, MD;  Location: [facility] INVASIVE CV LAB;  Service: Cardiovascular;  Laterality: N/A;   VENTRICULAR ABLATION SURGERY  06/08/2016   WRIST FRACTURE SURGERY Left 06/2005   "plates and screws and cadavaer bone"    Current Outpatient Medications  Medication Sig Dispense Refill   acetaminophen (TYLENOL) 325 MG tablet Take 1-2 tablets (325-650 mg total) by mouth every 4 (four) hours as needed for mild pain.     ALPRAZolam (XANAX) 0.25 MG tablet Take 0.25 mg by mouth at bedtime.     aspirin 81 MG tablet Take 81 mg by mouth daily.     carvedilol (COREG) 25 MG tablet TAKE 1 TABLET BY MOUTH 2 TIMES DAILY WITH A MEAL. 180 tablet 1   fenofibrate 160 MG tablet TAKE 1 TABLET BY MOUTH DAILY. 90 tablet 3   fluticasone (FLONASE) 50 MCG/ACT nasal spray Place 1 spray into both nostrils daily as needed for allergies or rhinitis.     furosemide (LASIX) 40 MG tablet Take 1 tablet (40 mg total) by mouth daily. 90 tablet 3   lisinopril (ZESTRIL) 10 MG tablet TAKE 1 TABLET BY MOUTH IN THE MORNING AND 1/2 TABLET IN THE EVENING. 135 tablet 3   magnesium oxide (MAG-OX) 400 MG tablet Take 1 tablet (400 mg total) by mouth 2 (two) times daily. Please make overdue appt with Dr. Graciela Husbands before anymore refills. Thank you 1st attempt (Patient taking differently: Take 1 tablet by mouth daily. Please make overdue appt with Dr. Graciela Husbands before anymore refills. Thank you 1st attempt) 60 tablet 0   metFORMIN (GLUCOPHAGE) 500 MG tablet Take 1,000 mg by mouth 2 (two) times daily with a meal.     NITROSTAT 0.4 MG SL tablet PLACE 1 TABLET UNDER THE TONGUE EVERY 5 MINUTES AS NEEDED FOR CHEST PAIN FOR UP TO 3 DOSES. 25 tablet 3   ONETOUCH VERIO test strip 1 each by Other route as needed for other.   6   rosuvastatin (CRESTOR) 20 MG tablet Take 1 tablet by mouth every Sunday.     sotalol (BETAPACE) 160 MG tablet Take 1 tablet (160 mg total) by mouth daily. 90 tablet 3   spironolactone (ALDACTONE) 25 MG  tablet TAKE 1/2 TABLET (12.5 MG TOTAL) BY MOUTH DAILY. 45 tablet 3   No current facility-administered medications for this visit.    Allergies:   Entresto [sacubitril-valsartan], Linagliptin-metformin hcl, Amphotericin b lipid complex [amphotericin b], Vytorin [ezetimibe-simvastatin], Evolocumab, and Statins   Social History:  The patient  reports that he quit smoking about 17 years ago. His smoking use included cigarettes. He has a 60.00 pack-year smoking history. He has never used smokeless tobacco. He reports current alcohol use of about 3.0 standard drinks of alcohol per week. He reports that he does not use drugs.   Family History:  The patient's family history is not on file. He was adopted.   ROS:  Please see the history of present  illness.  All other systems are reviewed and otherwise negative.   PHYSICAL EXAM:  VS:  There were no vitals taken for this visit. BMI: There is no height or weight on file to calculate BMI. Well nourished, well developed, in no acute distress  HEENT: normocephalic, atraumatic  Neck: no JVD, carotid bruits or masses Cardiac:  RRR, no significant murmurs, no rubs, or gallops Lungs:  CTA b/l, no wheezing, rhonchi or rales  Abd: soft, nontender MS: no deformity or atrophy Ext:  no edema  Skin: warm and dry, no rash Neuro:  No gross deficits appreciated Psych: euthymic mood, full affect  ICD site is stable, appears well healed, no tethering or discomfort   EKG:  done today and reviewed by myself AV paced, QRS , QTc given QRS duration remains acceptable  ICD interrogation today and reviewed by myself:  Battery and lead measurements are good LV lead EGM signal is small and note noisy signal, more of an appearance of 60 cycle signal, though isolated to the LV LV lead impedance/thresholds checked several times and stable CXR reviewed, all pins appear well seated Reached out to industry, was available for his review Agreed an unusual LV lead EGM  signal, though again, re-measured  and stable All vectors checked with phrenic at the distal pole  Sensing is not done via LV lead and no worries about inappropriate therapies, with very good threshold/impedances. MDT rep will discuss with tach support though did not think there would be any need to do anything. There was som signal on A channel as well, though very small and not significant and again great numbers on the lead    04/12/21: TTE 1. Left ventricular ejection fraction, by estimation, is 25 to 30%. Left  ventricular ejection fraction by 2D MOD biplane is 28.9 %. Left  ventricular ejection fraction by PLAX is 27 %. The left ventricle has  severely decreased function. The left  ventricle demonstrates regional wall motion abnormalities (see scoring  diagram/findings for description). The left ventricular internal cavity  size was moderately dilated. Left ventricular diastolic parameters are  consistent with Grade II diastolic  dysfunction (pseudonormalization).   2. Right ventricular systolic function is normal. The right ventricular  size is normal. There is moderately elevated pulmonary artery systolic  pressure.   3. Right atrial size was moderately dilated.   4. The mitral valve is normal in structure. Trivial mitral valve  regurgitation. No evidence of mitral stenosis.   5. The aortic valve is tricuspid. Aortic valve regurgitation is not  visualized. No aortic stenosis is present.   6. The inferior vena cava is normal in size with greater than 50%  respiratory variability, suggesting right atrial pressure of 3 mmHg.   05/19/2019: TTE IMPRESSIONS   1. Left ventricular ejection fraction, by estimation, is 25 to 30%. The  left ventricle has severely decreased function. There is severe akinesis  of the left ventricular, mid-apical anteroseptal wall.   2. Right ventricular systolic function is severely reduced. The right  ventricular size is mildly enlarged.   3. Mild to  moderate mitral valve regurgitation.   06/08/2016: EPS/Ablation  CONCLUSIONS: 1. Sinus rhythm upon presentation   2. Inducible ventricular tachycardia with a cycle length of 360 milliseconds, which was felt to be the clinical tachycardia.  This was a right bundle-branch superior axis VT with a Q-wave in V6.  This tachycardia was mapped (pace mapping) and successfully ablated along the inferoseptal portion of the left ventricle. 3. A large  inferior LV scar was demonstrated with voltage mapping.  Only a small septal scar was observed.  There was no apical or anterior scar demonstrated by voltage mapping today.l 4. Extensive substrate modification with radiofrequency ablation performed along the inferior and inferoseptal portions of the LV scar. 5. No early apparent complications.   01/19/16: LHC Conclusion   Prox LAD to Mid LAD lesion, 85 %stenosed. Ost Ramus to Ramus lesion, 55 %stenosed. Prox Cx to Mid Cx lesion, 100 %stenosed. Ost RCA to Dist RCA lesion, 100 %stenosed. SVG graft was visualized by angiography and is small. SVG graft was visualized by angiography and is small. LIMA graft was visualized by angiography and is large and anatomically normal. There is moderate left ventricular systolic dysfunction. LV end diastolic pressure is moderately elevated. The left ventricular ejection fraction is 35-45% by visual estimate. 1. Severe 3 vessel obstructive CAD 2. Large patent LIMA to the LAD 3. Patent SVG to OM2. This is a tiny graft supplying a very small OM  4. Patent SVG to PDA. This is also a tiny graft supplying a very tiny PDA 5. Moderate LV dysfunction. 6. Elevated LV EDP Plan: medical therapy. Antiarrhythmic drug therapy per EP team.   07/01/14: TTE Study Conclusions - Left ventricle: The cavity size was normal. Wall thickness was   normal. Systolic function was moderately reduced. The estimated   ejection fraction was in the range of 35% to 40%. Diffuse   hypokinesis, the  apex and periapical segments appeared most   abnormal. Doppler parameters are consistent with abnormal left   ventricular relaxation (grade 1 diastolic dysfunction). - Aortic valve: There was no stenosis. - Mitral valve: There was trivial regurgitation. - Left atrium: The atrium was mildly dilated. - Right ventricle: Poorly visualized. The cavity size was normal.   Pacer wire or catheter noted in right ventricle. Systolic   function was mildly reduced. - Pulmonary arteries: No complete TR doppler jet so unable to   estimate PA systolic pressure. - Inferior vena cava: The vessel was normal in size. The   respirophasic diameter changes were in the normal range (= 50%),   consistent with normal central venous pressure. Impressions - Technically difficult study with poor acoustic windows. Echo   contrast may have helped but was not used (would use in future).   Normal LV size with EF 35-40%. Diffuse hypokinesis, worse in the   apex and peri-apical segments. Poorly visualized RV but probably   normal size and mildly decreased systolic function.  Recent Labs: 07/31/2022: Hemoglobin 13.6; Platelets 164 08/30/2022: BUN 40; Creatinine, Ser 2.01; Magnesium 1.8; Potassium 4.7; Sodium 139  No results found for requested labs within last 365 days.   CrCl cannot be calculated (Patient's most recent lab result is older than the maximum 21 days allowed.).   Wt Readings from Last 3 Encounters:  08/18/22 208 lb (94.3 kg)  08/16/22 207 lb 8 oz (94.1 kg)  07/19/22 211 lb (95.7 kg)     Other studies reviewed: Additional studies/records reviewed today include: summarized above  ASSESSMENT AND PLAN:  1. ICD     As above, no changes made     MDT rep will circle back with [place] once reviewed with technical services  ADDEND: 09/29/22 MDT rep called, with tech services review, lead function is intact, no concerns with the lead/lead system. Signal seen not intrinsic/not participating in sensing  2. VT       Ablated 2018 >> has had recurrent VT  Sotalol w/stable QTc      2 NSVT on today's device check       Sotalol remains appropriately dosed by last labs (daily dosing)  3. ICM 4. Chronic CHF (systolic)     exam is euvolemic     OptiVol early but looks good     On BB/ACE, aldactone   4. CAD     medical management     No anginal symptoms     On ASA, BB, fenofibrate, repatha, chart reports statin and zetia allergy/intolernaces     C/w Dr. Swaziland   Disposition: will have back stop visit in a month to ensure device LV lead is f/u on, though can move that out pending further d/w industry.   Current medicines are reviewed at length with the patient today.  The patient did not have any concerns regarding medicines.  Judith Blonder, PA-C 09/23/2022 1:47 PM     [facility] [address] [place] [address] [phone] (office)  [phone] (fax)

## 2022-09-25 ENCOUNTER — Encounter: Payer: Self-pay | Admitting: Physician Assistant

## 2022-09-25 ENCOUNTER — Ambulatory Visit: Payer: Medicare Other | Attending: Physician Assistant | Admitting: Physician Assistant

## 2022-09-25 VITALS — BP 112/60 | HR 83 | Ht 72.0 in | Wt 209.0 lb

## 2022-09-25 DIAGNOSIS — I472 Ventricular tachycardia, unspecified: Secondary | ICD-10-CM | POA: Diagnosis not present

## 2022-09-25 DIAGNOSIS — I5022 Chronic systolic (congestive) heart failure: Secondary | ICD-10-CM

## 2022-09-25 DIAGNOSIS — I2510 Atherosclerotic heart disease of native coronary artery without angina pectoris: Secondary | ICD-10-CM

## 2022-09-25 DIAGNOSIS — Z95810 Presence of automatic (implantable) cardiac defibrillator: Secondary | ICD-10-CM

## 2022-09-25 DIAGNOSIS — I255 Ischemic cardiomyopathy: Secondary | ICD-10-CM | POA: Diagnosis not present

## 2022-09-25 LAB — CUP PACEART INCLINIC DEVICE CHECK
Battery Remaining Longevity: 110 mo
Battery Voltage: 3.04 V
Brady Statistic AP VP Percent: 51.45 %
Brady Statistic AP VS Percent: 0.49 %
Brady Statistic AS VP Percent: 46.78 %
Brady Statistic AS VS Percent: 1.29 %
Brady Statistic RA Percent Paced: 52.19 %
Brady Statistic RV Percent Paced: 98.22 %
Date Time Interrogation Session: 20240617175251
HighPow Impedance: 53 Ohm
Implantable Lead Connection Status: 753985
Implantable Lead Connection Status: 753985
Implantable Lead Connection Status: 753985
Implantable Lead Implant Date: 20130513
Implantable Lead Implant Date: 20130513
Implantable Lead Implant Date: 20240507
Implantable Lead Location: 753858
Implantable Lead Location: 753859
Implantable Lead Location: 753860
Implantable Lead Model: 181
Implantable Lead Model: 4598
Implantable Lead Model: 5076
Implantable Lead Serial Number: 316784
Implantable Pulse Generator Implant Date: 20240507
Lead Channel Impedance Value: 361 Ohm
Lead Channel Impedance Value: 399 Ohm
Lead Channel Impedance Value: 399 Ohm
Lead Channel Impedance Value: 399 Ohm
Lead Channel Impedance Value: 418 Ohm
Lead Channel Impedance Value: 418 Ohm
Lead Channel Impedance Value: 494 Ohm
Lead Channel Impedance Value: 513 Ohm
Lead Channel Impedance Value: 703 Ohm
Lead Channel Impedance Value: 722 Ohm
Lead Channel Impedance Value: 741 Ohm
Lead Channel Impedance Value: 741 Ohm
Lead Channel Impedance Value: 779 Ohm
Lead Channel Pacing Threshold Amplitude: 0.5 V
Lead Channel Pacing Threshold Amplitude: 0.625 V
Lead Channel Pacing Threshold Amplitude: 0.75 V
Lead Channel Pacing Threshold Amplitude: 0.75 V
Lead Channel Pacing Threshold Amplitude: 0.875 V
Lead Channel Pacing Threshold Amplitude: 1 V
Lead Channel Pacing Threshold Pulse Width: 0.4 ms
Lead Channel Pacing Threshold Pulse Width: 0.4 ms
Lead Channel Pacing Threshold Pulse Width: 0.4 ms
Lead Channel Pacing Threshold Pulse Width: 0.4 ms
Lead Channel Pacing Threshold Pulse Width: 0.4 ms
Lead Channel Pacing Threshold Pulse Width: 0.4 ms
Lead Channel Sensing Intrinsic Amplitude: 1.3 mV
Lead Channel Sensing Intrinsic Amplitude: 11.8 mV
Lead Channel Setting Pacing Amplitude: 1.25 V
Lead Channel Setting Pacing Amplitude: 1.5 V
Lead Channel Setting Pacing Amplitude: 2 V
Lead Channel Setting Pacing Pulse Width: 0.4 ms
Lead Channel Setting Pacing Pulse Width: 0.4 ms
Lead Channel Setting Sensing Sensitivity: 0.45 mV
Zone Setting Status: 755011

## 2022-09-25 NOTE — Patient Instructions (Addendum)
Medication Instructions:    Your physician recommends that you continue on your current medications as directed. Please refer to the Current Medication list given to you today.  *If you need a refill on your cardiac medications before your next appointment, please call your pharmacy*   Lab Work: NONE ORDERED  TODAY   If you have labs (blood work) drawn today and your tests are completely normal, you will receive your results only by: MyChart Message (if you have MyChart) OR A paper copy in the mail If you have any lab test that is abnormal or we need to change your treatment, we will call you to review the results.   Testing/Procedures: NONE ORDERED  TODAY    Follow-Up: At [facility], you and your health needs are our priority.  As part of our continuing mission to provide you with exceptional heart care, we have created designated Provider Care Teams.  These Care Teams include your primary Cardiologist (physician) and Advanced Practice Providers (APPs -  Physician Assistants and Nurse Practitioners) who all work together to provide you with the care you need, when you need it.  We recommend signing up for the patient portal called "MyChart".  Sign up information is provided on this After Visit Summary.  MyChart is used to connect with patients for Virtual Visits (Telemedicine).  Patients are able to view lab/test results, encounter notes, upcoming appointments, etc.  Non-urgent messages can be sent to your provider as well.   To learn more about what you can do with MyChart, go to [email or web address].     Your next appointment:  AS SCHEDULED   2 month(s)   Provider:    [name], MD   Other Instructions

## 2022-09-27 LAB — CUP PACEART REMOTE DEVICE CHECK
Battery Remaining Longevity: 112 mo
Battery Voltage: 3.03 V
Brady Statistic AP VP Percent: 45.96 %
Brady Statistic AP VS Percent: 0.59 %
Brady Statistic AS VP Percent: 51.87 %
Brady Statistic AS VS Percent: 1.58 %
Brady Statistic RA Percent Paced: 46.81 %
Brady Statistic RV Percent Paced: 97.83 %
Date Time Interrogation Session: 20240619192837
HighPow Impedance: 49 Ohm
Implantable Lead Connection Status: 753985
Implantable Lead Connection Status: 753985
Implantable Lead Connection Status: 753985
Implantable Lead Implant Date: 20130513
Implantable Lead Implant Date: 20130513
Implantable Lead Implant Date: 20240507
Implantable Lead Location: 753858
Implantable Lead Location: 753859
Implantable Lead Location: 753860
Implantable Lead Model: 181
Implantable Lead Model: 4598
Implantable Lead Model: 5076
Implantable Lead Serial Number: 316784
Implantable Pulse Generator Implant Date: 20240507
Lead Channel Impedance Value: 323 Ohm
Lead Channel Impedance Value: 380 Ohm
Lead Channel Impedance Value: 399 Ohm
Lead Channel Impedance Value: 399 Ohm
Lead Channel Impedance Value: 418 Ohm
Lead Channel Impedance Value: 418 Ohm
Lead Channel Impedance Value: 475 Ohm
Lead Channel Impedance Value: 494 Ohm
Lead Channel Impedance Value: 646 Ohm
Lead Channel Impedance Value: 665 Ohm
Lead Channel Impedance Value: 684 Ohm
Lead Channel Impedance Value: 722 Ohm
Lead Channel Impedance Value: 741 Ohm
Lead Channel Pacing Threshold Amplitude: 0.5 V
Lead Channel Pacing Threshold Amplitude: 0.75 V
Lead Channel Pacing Threshold Amplitude: 1 V
Lead Channel Pacing Threshold Pulse Width: 0.4 ms
Lead Channel Pacing Threshold Pulse Width: 0.4 ms
Lead Channel Pacing Threshold Pulse Width: 0.4 ms
Lead Channel Sensing Intrinsic Amplitude: 1.1 mV
Lead Channel Sensing Intrinsic Amplitude: 5.9 mV
Lead Channel Setting Pacing Amplitude: 1 V
Lead Channel Setting Pacing Amplitude: 1.5 V
Lead Channel Setting Pacing Amplitude: 2 V
Lead Channel Setting Pacing Pulse Width: 0.4 ms
Lead Channel Setting Pacing Pulse Width: 0.4 ms
Lead Channel Setting Sensing Sensitivity: 0.45 mV
Zone Setting Status: 755011

## 2022-09-28 ENCOUNTER — Ambulatory Visit: Payer: Medicare Other

## 2022-09-28 DIAGNOSIS — I255 Ischemic cardiomyopathy: Secondary | ICD-10-CM

## 2022-10-18 NOTE — Progress Notes (Signed)
Remote ICD transmission.   

## 2022-11-03 NOTE — Progress Notes (Unsigned)
[name] Date of Birth: 06/22/1955 Medical Record #[id number]  History of Present Illness: [name] is seen for follow up CAD and CHF.  He has an ischemic CM and has CRT-D in place for primary prevention since April of 2013. EF by Echo was 45-50% at that time. Cardiac MRI showed an EF of 31%. Other issues include HLD (statin intolerant and intolerant to zetia), hemochromatosis, RBBB, PAF, type 2 DM and OSA. He is s/p CABG in 2006 following an MI. Cardiac cath in February 2013 showed patrent grafts. After CRT therapy he did have a significant improvement in dyspnea.   He was admitted in October 2017 with VT and received shocks. Cardiac cath demonstrated patent grafts. He was started on sotalol. In January 2018 he presented with syncope and recurrent VT. Switched to amiodarone. Was concerned about taking amiodarone long term. Underwent VT ablation by Dr. Johney Frame on June 08, 2016. Amiodarone later discontinued.  Last device check in March showed runs of NSVT but no device therapies.   Last Echo in January 2023 showed EF 25-30%.  He is intolerant of niacin, statins (Simcor and Vytorin) and Zetia. Was seen by Dr Rennis Golden a year ago and approved for Repatha. Patient decided not to start.   On a prior visit we attempted to initiate him on Entresto. Lisinopril was washed out. Lasix changed to prn. Even at lower dose he was unable to tolerate Entresto due to hypotension with pressures in the 80s systolic and symptoms of lightheadedness and dizziness. Lisinopril resumed.   He had chest x-ray 04/06/2021 with probable bibasilar infiltrates.  He was treated with Levaquin. He had echocardiogram 04/12/2021 ordered by primary care provider which showed LVEF 25-30%, wall motion abnormalities, LV moderately dilated, grade 2 diastolic dysfunction, moderately elevated PASP, trivial MR. He was seen in Jan with increased weight and dyspnea. Farxiga added to regimen. He had been tried on Repatha before but reports sugar  increased significantly and A1c went over 9. Repatha was stopped.  He was seen on August 8 by Dr Ladona Ridgel for consideration of VT ablation. He has had 2 episodes of sustained VT over the last year. He has had multiple episodes of NSVT which he feels somewhat. Dr Ladona Ridgel felt it was best to continue to monitor given risk of procedure and difficulty inducing VT on prior EP study.   He did undergo revision of his BiV ICD in May. He was also started on Sotalol at that time. Since then he notes he is sleeping much better. He denies any chest pain or palpitations. No dizziness. Breathing is doing very well. He is active gardening.      Current Outpatient Medications  Medication Sig Dispense Refill   acetaminophen (TYLENOL) 325 MG tablet Take 1-2 tablets (325-650 mg total) by mouth every 4 (four) hours as needed for mild pain.     ALPRAZolam (XANAX) 0.25 MG tablet Take 0.25 mg by mouth at bedtime.     aspirin 81 MG tablet Take 81 mg by mouth daily.     carvedilol (COREG) 25 MG tablet TAKE 1 TABLET BY MOUTH 2 TIMES DAILY WITH A MEAL. 180 tablet 1   fenofibrate 160 MG tablet TAKE 1 TABLET BY MOUTH DAILY. 90 tablet 3   fluticasone (FLONASE) 50 MCG/ACT nasal spray Place 1 spray into both nostrils daily as needed for allergies or rhinitis.     furosemide (LASIX) 40 MG tablet Take 1 tablet (40 mg total) by mouth daily. 90 tablet 3   lisinopril (ZESTRIL)  10 MG tablet TAKE 1 TABLET BY MOUTH IN THE MORNING AND 1/2 TABLET IN THE EVENING. 135 tablet 3   magnesium oxide (MAG-OX) 400 MG tablet Take 1 tablet (400 mg total) by mouth 2 (two) times daily. Please make overdue appt with Dr. Graciela Husbands before anymore refills. Thank you 1st attempt (Patient taking differently: Take 1 tablet by mouth daily. Please make overdue appt with Dr. Graciela Husbands before anymore refills. Thank you 1st attempt) 60 tablet 0   metFORMIN (GLUCOPHAGE) 500 MG tablet Take 1,000 mg by mouth 2 (two) times daily with a meal.     NITROSTAT 0.4 MG SL tablet  PLACE 1 TABLET UNDER THE TONGUE EVERY 5 MINUTES AS NEEDED FOR CHEST PAIN FOR UP TO 3 DOSES. 25 tablet 3   ONETOUCH VERIO test strip 1 each by Other route as needed for other.   6   rosuvastatin (CRESTOR) 20 MG tablet Take 1 tablet by mouth every Sunday.     sotalol (BETAPACE) 160 MG tablet Take 1 tablet (160 mg total) by mouth daily. 90 tablet 3   spironolactone (ALDACTONE) 25 MG tablet TAKE 1/2 TABLET (12.5 MG TOTAL) BY MOUTH DAILY. 45 tablet 3   No current facility-administered medications for this visit.    Allergies  Allergen Reactions   Entresto [Sacubitril-Valsartan] Other (See Comments)    Hypotension, increased heart rate, dizziness, blurry vision   Linagliptin-Metformin Hcl     chest pain, GI upset   Amphotericin B Lipid Complex [Amphotericin B]     MYALGIAS   Vytorin [Ezetimibe-Simvastatin]     MYALGIAS   Evolocumab      Elevated BS   Statins Other (See Comments)    Myalgias     Past Medical History:  Diagnosis Date   AICD (automatic cardioverter/defibrillator) present    Anemia    Arthritis    "mild in my joints & knuckles" (05/10/2016)   CHF (congestive heart failure) (HCC)    Coronary artery disease    Hemochromatosis    Hyperlipidemia    Ischemic cardiomyopathy    EF 36%; prior anterior MI, s/p CABG x 3; s/p cath Feb 2013 showing grafts to be patent with severe LV dysfunction   Myocardial infarction (HCC) 2006   PAF (paroxysmal atrial fibrillation) (HCC)    Pneumonia 2000   RBBB (right bundle branch block)    Sleep apnea    "can't tolerate mask" (05/10/2016)   Type II diabetes mellitus (HCC)    Ventricular tachycardia, 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    Past Surgical History:  Procedure Laterality Date   BI-VENTRICULAR IMPLANTABLE CARDIOVERTER DEFIBRILLATOR N/A 08/21/2011   Procedure: BI-VENTRICULAR IMPLANTABLE CARDIOVERTER DEFIBRILLATOR  (CRT-D);  Surgeon: Duke Salvia, MD;  Location: [facility] CATH LAB;  Service: Cardiovascular;  Laterality: N/A;   BIV ICD  GENERTAOR CHANGE OUT N/A 06/12/2014   Procedure: BIV ICD GENERTAOR CHANGE OUT;  Surgeon: Duke Salvia, MD;  Location: [facility] CATH LAB;  Service: Cardiovascular;  Laterality: N/A;   BIV ICD INSERTION CRT-D N/A 08/15/2022   Procedure: BIV ICD INSERTION CRT-D;  Surgeon: Marinus Maw, MD;  Location[facility] INVASIVE CV LAB;  Service: Cardiovascular;  Laterality: N/A;   CARDIAC CATHETERIZATION N/A 01/19/2016   Procedure: Left Heart Cath and Cors/Grafts Angiography;  Surgeon: Issac Moure M [place], MD;  Location: [facility] INVASIVE CV LAB;  Service: Cardiovascular;  Laterality: N/A;   CORONARY ARTERY BYPASS GRAFT  2006   Emergent CABG x 3 with LIMA to LAD, SVG to OM, SVG to distal RCA per Dr. Tyrone Sage  FRACTURE SURGERY     INGUINAL HERNIA REPAIR  02/09/2012   Procedure: HERNIA REPAIR INGUINAL ADULT;  Surgeon: Cherylynn Ridges, MD;  Location: [facility] OR;  Service: General;  Laterality: Right;   INGUINAL HERNIA REPAIR Bilateral [phone]   INSERTION OF MESH  02/09/2012   Procedure: INSERTION OF MESH;  Surgeon: Cherylynn Ridges, MD;  Location: [facility] OR;  Service: General;  Laterality: Right;   LEFT HEART CATHETERIZATION WITH CORONARY/GRAFT ANGIOGRAM N/A 05/25/2011   Procedure: LEFT HEART CATHETERIZATION WITH Isabel Caprice;  Surgeon: Carmela Piechowski M [place], MD;  Location: [facility] CATH LAB;  Service: Cardiovascular;  Laterality: N/A;   LIVER BIOPSY  1990s   SHOULDER OPEN ROTATOR CUFF REPAIR Bilateral ?2000/~ 2007   left; right    V TACH ABLATION N/A 06/08/2016   Procedure: V Tach Ablation;  Surgeon: Hillis Range, MD;  Location: [facility] INVASIVE CV LAB;  Service: Cardiovascular;  Laterality: N/A;   VENTRICULAR ABLATION SURGERY  06/08/2016   WRIST FRACTURE SURGERY Left 06/2005   "plates and screws and cadavaer bone"    Social History   Tobacco Use  Smoking Status Former   Current packs/day: 0.00   Average packs/day: 2.0 packs/day for 30.0 years (60.0 ttl pk-yrs)   Types: Cigarettes   Start date: 03/12/1975   Quit date: 03/11/2005   Years  since quitting: 17.6  Smokeless Tobacco Never    Social History   Substance and Sexual Activity  Alcohol Use Yes   Alcohol/week: 3.0 standard drinks of alcohol   Types: 3 Cans of beer per week    Family History  Adopted: Yes    Review of Systems: The review of systems is per the HPI.  All other systems were reviewed and are negative.  Physical Exam: BP 116/68 (BP Location: Left Arm, Patient Position: Sitting, Cuff Size: Normal)   Pulse 83   Ht 6' (1.829 m)   Wt 211 lb 9.6 oz (96 kg)   SpO2 96%   BMI 28.70 kg/m  GENERAL:  Well appearing WM in NAD HEENT:  PERRL, EOMI, sclera are clear. Oropharynx is clear. NECK:  No jugular venous distention, carotid upstroke brisk and symmetric, no bruits, no thyromegaly or adenopathy LUNGS:  Clear to auscultation bilaterally CHEST:  Unremarkable HEART:  RRR,  PMI not displaced or sustained,S1 and S2 within normal limits, no S3, no S4: no clicks, no rubs, no murmurs ABD:  Soft, nontender. BS +, no masses or bruits. No hepatomegaly, no splenomegaly EXT:  poor pedal pulses. Hair loss below knees.  no edema, no cyanosis no clubbing SKIN:  Warm and dry.  No rashes NEURO:  Alert and oriented x 3. Cranial nerves II through XII intact. PSYCH:  Cognitively intact       Wt Readings from Last 3 Encounters:  11/06/22 211 lb 9.6 oz (96 kg)  09/25/22 209 lb (94.8 kg)  08/18/22 208 lb (94.3 kg)   LABORATORY DATA:  PENDING  Lab Results  Component Value Date   WBC 3.9 07/31/2022   HGB 13.6 07/31/2022   HCT 39.5 07/31/2022   PLT 164 07/31/2022   GLUCOSE 189 (H) 08/30/2022   CHOL 194 07/12/2021   TRIG 152 (H) 07/12/2021   HDL 43 07/12/2021   LDLDIRECT 206.0 04/14/2013   LDLCALC 124 (H) 07/12/2021   ALT 22 07/12/2021   AST 33 07/12/2021   NA 139 08/30/2022   K 4.7 08/30/2022   CL 101 08/30/2022   CREATININE 2.01 (H) 08/30/2022   BUN 40 (H) 08/30/2022  CO2 21 08/30/2022   TSH 2.970 09/03/2018   INR 1.05 01/19/2016   HGBA1C 6.8 (H)  05/22/2022   Labs dated 07/28/16: cholesterol 285, triglycerides 252, HDL 41, LDL 194.  Dated 12/13/16: A1c 6.1%.  Dated 09/18/18: cholesterol 229, triglycerides 200, HDL 35, LDL 154. LFTs normal. CBC normal Dated 01/16/19: normal TSH Dated 05/19/19. A1c 5.9% Dated 06/17/19: BUN 24, creatinine 1.63.  Dated 11/19/19: A1c 6.9%. cholesterol 124, triglycerides 206, HDL 44, LDL 39. Creatinine 1.8. BUN 21. Otherwise CMET, CBC and TSH normal. Dated 06/18/20: creatinine 1.41. otherwise BMET normal. Dated 07/28/20: A1c 6.9%. Dated 12/16/20: cholesterol 228, triglycerides 227, HDL 40, LDL 143, TSH normal Dated 1/61/09: A1c 6.2%.  Dated 12/28/21: cholesterol 211, triglycerides 177, HDL 43, LDL 133. A1c 6.8%. chemistries and TSH normal.     Echo 04/13/16: Study Conclusions   - Left ventricle: The cavity size was mildly dilated. Systolic   function was mildly to moderately reduced. The estimated ejection   fraction was in the range of 40% to 45%. Diffuse hypokinesis.   Akinesis of the apical myocardium. Doppler parameters are   consistent with abnormal left ventricular relaxation (grade 1   diastolic dysfunction). Doppler parameters are consistent with   indeterminate ventricular filling pressure. - Aortic valve: Transvalvular velocity was within the normal range.   There was no stenosis. There was no regurgitation. - Mitral valve: Transvalvular velocity was within the normal range.   There was no evidence for stenosis. There was trivial   regurgitation. - Right ventricle: The cavity size was normal. Wall thickness was   normal. - Tricuspid valve: There was trivial regurgitation. - Pulmonary arteries: Systolic pressure was within the normal   range. PA peak pressure: 25 mm Hg (S).  Cardiac cath 01/19/16: Procedures   Left Heart Cath and Cors/Grafts Angiography  Conclusion     Prox LAD to Mid LAD lesion, 85 %stenosed. Ost Ramus to Ramus lesion, 55 %stenosed. Prox Cx to Mid Cx lesion, 100  %stenosed. Ost RCA to Dist RCA lesion, 100 %stenosed. SVG graft was visualized by angiography and is small. SVG graft was visualized by angiography and is small. LIMA graft was visualized by angiography and is large and anatomically normal. There is moderate left ventricular systolic dysfunction. LV end diastolic pressure is moderately elevated. The left ventricular ejection fraction is 35-45% by visual estimate.   1. Severe 3 vessel obstructive CAD 2. Large patent LIMA to the LAD 3. Patent SVG to OM2. This is a tiny graft supplying a very small OM  4. Patent SVG to PDA. This is also a tiny graft supplying a very tiny PDA 5. Moderate LV dysfunction. 6. Elevated LV EDP   Plan: medical therapy. Antiarrhythmic drug therapy per EP team.     Echo 05/19/19: IMPRESSIONS     1. Left ventricular ejection fraction, by estimation, is 25 to 30%. The  left ventricle has severely decreased function. There is severe akinesis  of the left ventricular, mid-apical anteroseptal wall.   2. Right ventricular systolic function is severely reduced. The right  ventricular size is mildly enlarged.   3. Mild to moderate mitral valve regurgitation.    Echo 04/12/21 1. Left ventricular ejection fraction, by estimation, is 25 to 30%. Left  ventricular ejection fraction by 2D MOD biplane is 28.9 %. Left  ventricular ejection fraction by PLAX is 27 %. The left ventricle has  severely decreased function. The left  ventricle demonstrates regional wall motion abnormalities (see scoring  diagram/findings for description). The left  ventricular internal cavity  size was moderately dilated. Left ventricular diastolic parameters are  consistent with Grade II diastolic  dysfunction (pseudonormalization).   2. Right ventricular systolic function is normal. The right ventricular  size is normal. There is moderately elevated pulmonary artery systolic  pressure.   3. Right atrial size was moderately dilated.   4. The  mitral valve is normal in structure. Trivial mitral valve  regurgitation. No evidence of mitral stenosis.   5. The aortic valve is tricuspid. Aortic valve regurgitation is not  visualized. No aortic stenosis is present.   6. The inferior vena cava is normal in size with greater than 50%  respiratory variability, suggesting right atrial pressure of 3 mmHg.     Assessment / Plan: 1. Chronic systolic CHF status post CRT. EF reported 40-45% in 2018  but I thought at the time that this was overestimated. EF 25-30% in January 2023.  Clinically  class  II- III.  Now on lisinopril 10 mg in am and 5 mg in pm, Coreg 25 mg bid, aldactone 25 mg daily, lasix 40 mg daily.  Unable to afford [place].  Continue low sodium diet.   2. Ischemic cardiomyopathy. See above.  3. Coronary disease status post CABG in 2006. Cardiac catheterization in October 2017 showed all grafts were patent. Clinically without angina  4. Right bundle branch block.   5. Hyperlipidemia. Patient is intolerant to statins and Zetia. On Crestor only once a week. With addition of Repatha excellent response with LDL down to 39 but had significant elevation of blood sugar with A1c > 9 and repatha was stopped. Unable to afford Nexlitol.   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  Off AAD therapy. S/p upgrade to ICD this year. VT well controlled on Sotalol   7. CKD stage 3a  8. DM type 2. Last A1c 6.8%. per Dr Jacky Kindle   I will follow up in 6 months.

## 2022-11-06 ENCOUNTER — Ambulatory Visit: Payer: Medicare Other | Attending: Cardiology | Admitting: Cardiology

## 2022-11-06 ENCOUNTER — Encounter: Payer: Self-pay | Admitting: Cardiology

## 2022-11-06 VITALS — BP 116/68 | HR 83 | Ht 72.0 in | Wt 211.6 lb

## 2022-11-06 DIAGNOSIS — I5022 Chronic systolic (congestive) heart failure: Secondary | ICD-10-CM

## 2022-11-06 DIAGNOSIS — E785 Hyperlipidemia, unspecified: Secondary | ICD-10-CM

## 2022-11-06 DIAGNOSIS — I472 Ventricular tachycardia, unspecified: Secondary | ICD-10-CM | POA: Diagnosis not present

## 2022-11-06 DIAGNOSIS — I255 Ischemic cardiomyopathy: Secondary | ICD-10-CM | POA: Diagnosis not present

## 2022-11-06 DIAGNOSIS — Z95810 Presence of automatic (implantable) cardiac defibrillator: Secondary | ICD-10-CM | POA: Diagnosis not present

## 2022-11-06 NOTE — Patient Instructions (Signed)
Medication Instructions:  Continue same medications *If you need a refill on your cardiac medications before your next appointment, please call your pharmacy*   Lab Work: None ordered   Testing/Procedures: None ordered   Follow-Up: At [facility], you and your health needs are our priority.  As part of our continuing mission to provide you with exceptional heart care, we have created designated Provider Care Teams.  These Care Teams include your primary Cardiologist (physician) and Advanced Practice Providers (APPs -  Physician Assistants and Nurse Practitioners) who all work together to provide you with the care you need, when you need it.  We recommend signing up for the patient portal called "MyChart".  Sign up information is provided on this After Visit Summary.  MyChart is used to connect with patients for Virtual Visits (Telemedicine).  Patients are able to view lab/test results, encounter notes, upcoming appointments, etc.  Non-urgent messages can be sent to your provider as well.   To learn more about what you can do with MyChart, go to [email or web address].    Your next appointment:  6 months    Call in Sept to schedule Jan appointment     Provider:  Dr.Jordan   

## 2022-11-10 ENCOUNTER — Ambulatory Visit: Payer: Medicare Other | Admitting: Physician Assistant

## 2022-11-21 ENCOUNTER — Encounter: Payer: Self-pay | Admitting: Internal Medicine

## 2022-11-21 ENCOUNTER — Ambulatory Visit: Payer: Medicare Other | Attending: Internal Medicine | Admitting: Internal Medicine

## 2022-11-21 VITALS — BP 112/60 | HR 84 | Ht 72.0 in | Wt 213.2 lb

## 2022-11-21 DIAGNOSIS — I472 Ventricular tachycardia, unspecified: Secondary | ICD-10-CM

## 2022-11-21 NOTE — Patient Instructions (Signed)
 Medication Instructions:  Your physician recommends that you continue on your current medications as directed. Please refer to the Current Medication list given to you today.  *If you need a refill on your cardiac medications before your next appointment, please call your pharmacy*  Lab Work: None ordered.  If you have labs (blood work) drawn today and your tests are completely normal, you will receive your results only by: MyChart Message (if you have MyChart) OR A paper copy in the mail If you have any lab test that is abnormal or we need to change your treatment, we will call you to review the results.  Testing/Procedures: None ordered.  Follow-Up: At [facility], you and your health needs are our priority.  As part of our continuing mission to provide you with exceptional heart care, we have created designated Provider Care Teams.  These Care Teams include your primary Cardiologist (physician) and Advanced Practice Providers (APPs -  Physician Assistants and Nurse Practitioners) who all work together to provide you with the care you need, when you need it.  We recommend signing up for the patient portal called "MyChart".  Sign up information is provided on this After Visit Summary.  MyChart is used to connect with patients for Virtual Visits (Telemedicine).  Patients are able to view lab/test results, encounter notes, upcoming appointments, etc.  Non-urgent messages can be sent to your provider as well.   To learn more about what you can do with MyChart, go to [email or web address].    Your next appointment:   1 year(s)  The format for your next appointment:   In Person  Provider:   [name], MD{or one of the following Advanced Practice Providers on your designated Care Team:   Francis Dowse, [place] Casimiro Needle "Mardelle Matte" [place], [place] Earnest Rosier, NP   Important Information About Sugar

## 2022-11-21 NOTE — Progress Notes (Signed)
HPI [name] returns today for followup. He had been referred by Dr. Crissie Sickles to consider VT ablation. He has a long h/o CAD and chronic systolic heart failure s/p biv ICD insertion. The patient has his LV lead turned off due to diaghragmatic stimulation. He has class 2 CHF. He has had 7 episodes of sustained VT over the last year. He has had multiple episodes of NSVT which he feels somewhat. He has not been shocked. He denies syncope. When I saw him his symptoms were more consistent with CHF and he underwent upgrade to a Biv ICD. He has done well in the interim. No additional VT since his implant.  Allergies  Allergen Reactions   Entresto [Sacubitril-Valsartan] Other (See Comments)    Hypotension, increased heart rate, dizziness, blurry vision   Linagliptin-Metformin Hcl     chest pain, GI upset   Amphotericin B Lipid Complex [Amphotericin B]     MYALGIAS   Vytorin [Ezetimibe-Simvastatin]     MYALGIAS   Evolocumab      Elevated BS   Statins Other (See Comments)    Myalgias      Current Outpatient Medications  Medication Sig Dispense Refill   acetaminophen (TYLENOL) 325 MG tablet Take 1-2 tablets (325-650 mg total) by mouth every 4 (four) hours as needed for mild pain.     ALPRAZolam (XANAX) 0.25 MG tablet Take 0.25 mg by mouth at bedtime.     aspirin 81 MG tablet Take 81 mg by mouth daily.     carvedilol (COREG) 25 MG tablet TAKE 1 TABLET BY MOUTH 2 TIMES DAILY WITH A MEAL. 180 tablet 1   fenofibrate 160 MG tablet TAKE 1 TABLET BY MOUTH DAILY. 90 tablet 3   fluticasone (FLONASE) 50 MCG/ACT nasal spray Place 1 spray into both nostrils daily as needed for allergies or rhinitis.     furosemide (LASIX) 40 MG tablet Take 1 tablet (40 mg total) by mouth daily. 90 tablet 3   lisinopril (ZESTRIL) 10 MG tablet TAKE 1 TABLET BY MOUTH IN THE MORNING AND 1/2 TABLET IN THE EVENING. 135 tablet 3   magnesium oxide (MAG-OX) 400 MG tablet Take 1 tablet (400 mg total) by mouth 2 (two) times  daily. Please make overdue appt with Dr. Graciela Husbands before anymore refills. Thank you 1st attempt (Patient taking differently: Take 1 tablet by mouth daily. Please make overdue appt with Dr. Graciela Husbands before anymore refills. Thank you 1st attempt) 60 tablet 0   metFORMIN (GLUCOPHAGE) 500 MG tablet Take 1,000 mg by mouth 2 (two) times daily with a meal.     NITROSTAT 0.4 MG SL tablet PLACE 1 TABLET UNDER THE TONGUE EVERY 5 MINUTES AS NEEDED FOR CHEST PAIN FOR UP TO 3 DOSES. 25 tablet 3   ONETOUCH VERIO test strip 1 each by Other route as needed for other.   6   rosuvastatin (CRESTOR) 20 MG tablet Take 1 tablet by mouth every Sunday.     sotalol (BETAPACE) 160 MG tablet Take 1 tablet (160 mg total) by mouth daily. 90 tablet 3   spironolactone (ALDACTONE) 25 MG tablet TAKE 1/2 TABLET (12.5 MG TOTAL) BY MOUTH DAILY. 45 tablet 3   No current facility-administered medications for this visit.     Past Medical History:  Diagnosis Date   AICD (automatic cardioverter/defibrillator) present    Anemia    Arthritis    "mild in my joints & knuckles" (05/10/2016)   CHF (congestive heart failure) (HCC)  Coronary artery disease    Hemochromatosis    Hyperlipidemia    Ischemic cardiomyopathy    EF 36%; prior anterior MI, s/p CABG x 3; s/p cath Feb 2013 showing grafts to be patent with severe LV dysfunction   Myocardial infarction (HCC) 2006   PAF (paroxysmal atrial fibrillation) (HCC)    Pneumonia 2000   RBBB (right bundle branch block)    Sleep apnea    "can't tolerate mask" (05/10/2016)   Type II diabetes mellitus (HCC)    Ventricular tachycardia, 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    ROS:   All systems reviewed and negative except as noted in the HPI.   Past Surgical History:  Procedure Laterality Date   BI-VENTRICULAR IMPLANTABLE CARDIOVERTER DEFIBRILLATOR N/A 08/21/2011   Procedure: BI-VENTRICULAR IMPLANTABLE CARDIOVERTER DEFIBRILLATOR  (CRT-D);  Surgeon: Duke Salvia, MD;  Location: [facility] CATH LAB;   Service: Cardiovascular;  Laterality: N/A;   BIV ICD GENERTAOR CHANGE OUT N/A 06/12/2014   Procedure: BIV ICD GENERTAOR CHANGE OUT;  Surgeon: Duke Salvia, MD;  Location: [facility] CATH LAB;  Service: Cardiovascular;  Laterality: N/A;   BIV ICD INSERTION CRT-D N/A 08/15/2022   Procedure: BIV ICD INSERTION CRT-D;  Surgeon: Marinus Maw, MD;  Location: [facility] INVASIVE CV LAB;  Service: Cardiovascular;  Laterality: N/A;   CARDIAC CATHETERIZATION N/A 01/19/2016   Procedure: Left Heart Cath and Cors/Grafts Angiography;  Surgeon: Peter M Swaziland, MD;  Location: [facility] INVASIVE CV LAB;  Service: Cardiovascular;  Laterality: N/A;   CORONARY ARTERY BYPASS GRAFT  2006   Emergent CABG x 3 with LIMA to LAD, SVG to OM, SVG to distal RCA per Dr. Tyrone Sage   FRACTURE SURGERY     INGUINAL HERNIA REPAIR  02/09/2012   Procedure: HERNIA REPAIR INGUINAL ADULT;  Surgeon: Cherylynn Ridges, MD;  Location: [facility] OR;  Service: General;  Laterality: Right;   INGUINAL HERNIA REPAIR Bilateral [id number]   INSERTION OF MESH  02/09/2012   Procedure: INSERTION OF MESH;  Surgeon: Cherylynn Ridges, MD;  Location: [facility] OR;  Service: General;  Laterality: Right;   LEFT HEART CATHETERIZATION WITH CORONARY/GRAFT ANGIOGRAM N/A 05/25/2011   Procedure: LEFT HEART CATHETERIZATION WITH Isabel Caprice;  Surgeon: Peter M Swaziland, MD;  Location: [facility] CATH LAB;  Service: Cardiovascular;  Laterality: N/A;   LIVER BIOPSY  1990s   SHOULDER OPEN ROTATOR CUFF REPAIR Bilateral ?2000/~ 2007   left; right    V TACH ABLATION N/A 06/08/2016   Procedure: V Tach Ablation;  Surgeon: Hillis Range, MD;  Location: [facility] INVASIVE CV LAB;  Service: Cardiovascular;  Laterality: N/A;   VENTRICULAR ABLATION SURGERY  06/08/2016   WRIST FRACTURE SURGERY Left 06/2005   "plates and screws and cadavaer bone"     Family History  Adopted: Yes     Social History   Socioeconomic History   Marital status: Married    Spouse name: Not on file   Number of children: 1   Years of  education: Not on file   Highest education level: Not on file  Occupational History   Occupation: telephone service  Tobacco Use   Smoking status: Former    Current packs/day: 0.00    Average packs/day: 2.0 packs/day for 30.0 years (60.0 ttl pk-yrs)    Types: Cigarettes    Start date: 03/12/1975    Quit date: 03/11/2005    Years since quitting: 17.7   Smokeless tobacco: Never  Vaping Use   Vaping status: Never Used  Substance and Sexual Activity   Alcohol use:  Yes    Alcohol/week: 3.0 standard drinks of alcohol    Types: 3 Cans of beer per week   Drug use: No   Sexual activity: Not Currently  Other Topics Concern   Not on file  Social History Narrative   Not on file   Social Determinants of Health   Financial Resource Strain: Not on file  Food Insecurity: No Food Insecurity (08/15/2022)   Hunger Vital Sign    Worried About Running Out of Food in the Last Year: Never true    Ran Out of Food in the Last Year: Never true  Transportation Needs: No Transportation Needs (08/15/2022)   PRAPARE - Administrator, Civil Service (Medical): No    Lack of Transportation (Non-Medical): No  Physical Activity: Not on file  Stress: Not on file  Social Connections: Not on file  Intimate Partner Violence: Not At Risk (08/15/2022)   Humiliation, Afraid, Rape, and Kick questionnaire    Fear of Current or Ex-Partner: No    Emotionally Abused: No    Physically Abused: No    Sexually Abused: No     BP 112/60   Pulse 84   Ht 6' (1.829 m)   Wt 213 lb 3.2 oz (96.7 kg)   SpO2 97%   BMI 28.92 kg/m   Physical Exam:  Well appearing NAD HEENT: Unremarkable Neck:  No JVD, no thyromegally Lymphatics:  No adenopathy Back:  No CVA tenderness Lungs:  Clear HEART:  Regular rate rhythm, no murmurs, no rubs, no clicks Abd:  soft, positive bowel sounds, no organomegally, no rebound, no guarding Ext:  2 plus pulses, no edema, no cyanosis, no clubbing Skin:  No rashes no nodules Neuro:   CN II through XII intact, motor grossly intact  EKG  DEVICE  Normal device function.  See PaceArt for details.   Assess/Plan:  Recurrent VT -His VT has quieted down since his Biv upgrade. He is not on any AA drugs. He will undergo watchful waiting. Chronic systolic heart failure - he has class 2 symptoms. He has done well with improvement in his symptoms. His QRS has gone from 196 to 146 with CRT.  CAD, s/p CABG - he is s/p MI but denies any anginal symptoms ICD - his [employer] Biv ICD is working normally with over 9 years of battery longevity.   Sharlot Gowda ,MD

## 2022-12-06 ENCOUNTER — Other Ambulatory Visit: Payer: Self-pay | Admitting: Cardiology

## 2022-12-25 ENCOUNTER — Other Ambulatory Visit: Payer: Self-pay | Admitting: Cardiology

## 2022-12-27 LAB — CUP PACEART REMOTE DEVICE CHECK
Battery Remaining Longevity: 108 mo
Battery Voltage: 3.02 V
Brady Statistic AP VP Percent: 44.24 %
Brady Statistic AP VS Percent: 0.56 %
Brady Statistic AS VP Percent: 53.32 %
Brady Statistic AS VS Percent: 1.88 %
Brady Statistic RA Percent Paced: 45.37 %
Brady Statistic RV Percent Paced: 97.55 %
Date Time Interrogation Session: 20240918232643
HighPow Impedance: 51 Ohm
Implantable Lead Connection Status: 753985
Implantable Lead Connection Status: 753985
Implantable Lead Connection Status: 753985
Implantable Lead Implant Date: 20130513
Implantable Lead Implant Date: 20130513
Implantable Lead Implant Date: 20240507
Implantable Lead Location: 753858
Implantable Lead Location: 753859
Implantable Lead Location: 753860
Implantable Lead Model: 181
Implantable Lead Model: 4598
Implantable Lead Model: 5076
Implantable Lead Serial Number: 316784
Implantable Pulse Generator Implant Date: 20240507
Lead Channel Impedance Value: 304 Ohm
Lead Channel Impedance Value: 361 Ohm
Lead Channel Impedance Value: 380 Ohm
Lead Channel Impedance Value: 380 Ohm
Lead Channel Impedance Value: 418 Ohm
Lead Channel Impedance Value: 437 Ohm
Lead Channel Impedance Value: 456 Ohm
Lead Channel Impedance Value: 456 Ohm
Lead Channel Impedance Value: 608 Ohm
Lead Channel Impedance Value: 646 Ohm
Lead Channel Impedance Value: 646 Ohm
Lead Channel Impedance Value: 684 Ohm
Lead Channel Impedance Value: 722 Ohm
Lead Channel Pacing Threshold Amplitude: 0.5 V
Lead Channel Pacing Threshold Amplitude: 0.75 V
Lead Channel Pacing Threshold Amplitude: 0.875 V
Lead Channel Pacing Threshold Pulse Width: 0.4 ms
Lead Channel Pacing Threshold Pulse Width: 0.4 ms
Lead Channel Pacing Threshold Pulse Width: 0.4 ms
Lead Channel Sensing Intrinsic Amplitude: 1 mV
Lead Channel Sensing Intrinsic Amplitude: 6 mV
Lead Channel Setting Pacing Amplitude: 1 V
Lead Channel Setting Pacing Amplitude: 1.5 V
Lead Channel Setting Pacing Amplitude: 2 V
Lead Channel Setting Pacing Pulse Width: 0.4 ms
Lead Channel Setting Pacing Pulse Width: 0.4 ms
Lead Channel Setting Sensing Sensitivity: 0.45 mV
Zone Setting Status: 755011

## 2022-12-28 ENCOUNTER — Ambulatory Visit (INDEPENDENT_AMBULATORY_CARE_PROVIDER_SITE_OTHER): Payer: Medicare Other

## 2022-12-28 DIAGNOSIS — I255 Ischemic cardiomyopathy: Secondary | ICD-10-CM

## 2023-01-09 NOTE — Progress Notes (Signed)
Remote ICD transmission.   

## 2023-03-06 ENCOUNTER — Other Ambulatory Visit: Payer: Self-pay | Admitting: Cardiology

## 2023-03-29 ENCOUNTER — Ambulatory Visit: Payer: Medicare Other

## 2023-03-29 DIAGNOSIS — I255 Ischemic cardiomyopathy: Secondary | ICD-10-CM | POA: Diagnosis not present

## 2023-03-29 LAB — CUP PACEART REMOTE DEVICE CHECK
Battery Remaining Longevity: 99 mo
Battery Voltage: 3.01 V
Brady Statistic RV Percent Paced: 97.62 %
Date Time Interrogation Session: 20241219024848
HighPow Impedance: 59 Ohm
Implantable Lead Connection Status: 753985
Implantable Lead Connection Status: 753985
Implantable Lead Connection Status: 753985
Implantable Lead Implant Date: 20130513
Implantable Lead Implant Date: 20130513
Implantable Lead Implant Date: 20240507
Implantable Lead Location: 753858
Implantable Lead Location: 753859
Implantable Lead Location: 753860
Implantable Lead Model: 181
Implantable Lead Model: 4598
Implantable Lead Model: 5076
Implantable Lead Serial Number: 316784
Implantable Pulse Generator Implant Date: 20240507
Lead Channel Impedance Value: 304 Ohm
Lead Channel Impedance Value: 361 Ohm
Lead Channel Impedance Value: 380 Ohm
Lead Channel Impedance Value: 399 Ohm
Lead Channel Impedance Value: 399 Ohm
Lead Channel Impedance Value: 399 Ohm
Lead Channel Impedance Value: 437 Ohm
Lead Channel Impedance Value: 456 Ohm
Lead Channel Impedance Value: 608 Ohm
Lead Channel Impedance Value: 627 Ohm
Lead Channel Impedance Value: 646 Ohm
Lead Channel Impedance Value: 684 Ohm
Lead Channel Impedance Value: 703 Ohm
Lead Channel Pacing Threshold Amplitude: 0.625 V
Lead Channel Pacing Threshold Amplitude: 0.75 V
Lead Channel Pacing Threshold Amplitude: 1 V
Lead Channel Pacing Threshold Pulse Width: 0.4 ms
Lead Channel Pacing Threshold Pulse Width: 0.4 ms
Lead Channel Pacing Threshold Pulse Width: 0.4 ms
Lead Channel Sensing Intrinsic Amplitude: 1 mV
Lead Channel Sensing Intrinsic Amplitude: 7.4 mV
Lead Channel Setting Pacing Amplitude: 1.25 V
Lead Channel Setting Pacing Amplitude: 1.5 V
Lead Channel Setting Pacing Amplitude: 2 V
Lead Channel Setting Pacing Pulse Width: 0.4 ms
Lead Channel Setting Pacing Pulse Width: 0.4 ms
Lead Channel Setting Sensing Sensitivity: 0.45 mV
Zone Setting Status: 755011

## 2023-05-03 NOTE — Progress Notes (Signed)
Remote ICD transmission.   

## 2023-06-05 ENCOUNTER — Other Ambulatory Visit: Payer: Self-pay | Admitting: Cardiology

## 2023-06-28 ENCOUNTER — Ambulatory Visit: Payer: Medicare Other

## 2023-06-28 DIAGNOSIS — I472 Ventricular tachycardia, unspecified: Secondary | ICD-10-CM

## 2023-06-28 LAB — CUP PACEART REMOTE DEVICE CHECK
Battery Remaining Longevity: 96 mo
Battery Voltage: 3.01 V
Brady Statistic RV Percent Paced: 97.79 %
Date Time Interrogation Session: 20250319222305
HighPow Impedance: 58 Ohm
Implantable Lead Connection Status: 753985
Implantable Lead Connection Status: 753985
Implantable Lead Connection Status: 753985
Implantable Lead Implant Date: 20130513
Implantable Lead Implant Date: 20130513
Implantable Lead Implant Date: 20240507
Implantable Lead Location: 753858
Implantable Lead Location: 753859
Implantable Lead Location: 753860
Implantable Lead Model: 181
Implantable Lead Model: 4598
Implantable Lead Model: 5076
Implantable Lead Serial Number: 316784
Implantable Pulse Generator Implant Date: 20240507
Lead Channel Impedance Value: 285 Ohm
Lead Channel Impedance Value: 361 Ohm
Lead Channel Impedance Value: 361 Ohm
Lead Channel Impedance Value: 380 Ohm
Lead Channel Impedance Value: 380 Ohm
Lead Channel Impedance Value: 399 Ohm
Lead Channel Impedance Value: 437 Ohm
Lead Channel Impedance Value: 437 Ohm
Lead Channel Impedance Value: 589 Ohm
Lead Channel Impedance Value: 608 Ohm
Lead Channel Impedance Value: 627 Ohm
Lead Channel Impedance Value: 665 Ohm
Lead Channel Impedance Value: 684 Ohm
Lead Channel Pacing Threshold Amplitude: 0.75 V
Lead Channel Pacing Threshold Amplitude: 0.875 V
Lead Channel Pacing Threshold Amplitude: 1 V
Lead Channel Pacing Threshold Pulse Width: 0.4 ms
Lead Channel Pacing Threshold Pulse Width: 0.4 ms
Lead Channel Pacing Threshold Pulse Width: 0.4 ms
Lead Channel Sensing Intrinsic Amplitude: 1 mV
Lead Channel Sensing Intrinsic Amplitude: 6.6 mV
Lead Channel Setting Pacing Amplitude: 1.25 V
Lead Channel Setting Pacing Amplitude: 1.5 V
Lead Channel Setting Pacing Amplitude: 2 V
Lead Channel Setting Pacing Pulse Width: 0.4 ms
Lead Channel Setting Pacing Pulse Width: 0.4 ms
Lead Channel Setting Sensing Sensitivity: 0.45 mV
Zone Setting Status: 755011

## 2023-07-17 ENCOUNTER — Other Ambulatory Visit: Payer: Self-pay | Admitting: Cardiology

## 2023-07-23 ENCOUNTER — Encounter: Payer: Self-pay | Admitting: Internal Medicine

## 2023-07-23 LAB — COMPREHENSIVE METABOLIC PANEL WITH GFR: EGFR (Non-African Amer.): 28.5

## 2023-08-09 NOTE — Progress Notes (Signed)
 Remote ICD transmission.

## 2023-09-04 ENCOUNTER — Other Ambulatory Visit: Payer: Self-pay | Admitting: Internal Medicine

## 2023-09-27 ENCOUNTER — Ambulatory Visit (INDEPENDENT_AMBULATORY_CARE_PROVIDER_SITE_OTHER): Payer: Medicare Other

## 2023-09-27 DIAGNOSIS — I472 Ventricular tachycardia, unspecified: Secondary | ICD-10-CM | POA: Diagnosis not present

## 2023-09-27 LAB — CUP PACEART REMOTE DEVICE CHECK
Battery Remaining Longevity: 92 mo
Battery Voltage: 3.01 V
Brady Statistic RV Percent Paced: 98.35 %
Date Time Interrogation Session: 20250619004450
HighPow Impedance: 59 Ohm
Implantable Lead Connection Status: 753985
Implantable Lead Connection Status: 753985
Implantable Lead Connection Status: 753985
Implantable Lead Implant Date: 20130513
Implantable Lead Implant Date: 20130513
Implantable Lead Implant Date: 20240507
Implantable Lead Location: 753858
Implantable Lead Location: 753859
Implantable Lead Location: 753860
Implantable Lead Model: 181
Implantable Lead Model: 4598
Implantable Lead Model: 5076
Implantable Lead Serial Number: 316784
Implantable Pulse Generator Implant Date: 20240507
Lead Channel Impedance Value: 285 Ohm
Lead Channel Impedance Value: 342 Ohm
Lead Channel Impedance Value: 361 Ohm
Lead Channel Impedance Value: 361 Ohm
Lead Channel Impedance Value: 380 Ohm
Lead Channel Impedance Value: 380 Ohm
Lead Channel Impedance Value: 437 Ohm
Lead Channel Impedance Value: 456 Ohm
Lead Channel Impedance Value: 589 Ohm
Lead Channel Impedance Value: 608 Ohm
Lead Channel Impedance Value: 608 Ohm
Lead Channel Impedance Value: 665 Ohm
Lead Channel Impedance Value: 684 Ohm
Lead Channel Pacing Threshold Amplitude: 0.75 V
Lead Channel Pacing Threshold Amplitude: 0.875 V
Lead Channel Pacing Threshold Amplitude: 1 V
Lead Channel Pacing Threshold Pulse Width: 0.4 ms
Lead Channel Pacing Threshold Pulse Width: 0.4 ms
Lead Channel Pacing Threshold Pulse Width: 0.4 ms
Lead Channel Sensing Intrinsic Amplitude: 1 mV
Lead Channel Sensing Intrinsic Amplitude: 7 mV
Lead Channel Setting Pacing Amplitude: 1.25 V
Lead Channel Setting Pacing Amplitude: 1.5 V
Lead Channel Setting Pacing Amplitude: 2 V
Lead Channel Setting Pacing Pulse Width: 0.4 ms
Lead Channel Setting Pacing Pulse Width: 0.4 ms
Lead Channel Setting Sensing Sensitivity: 0.45 mV
Zone Setting Status: 755011

## 2023-09-28 ENCOUNTER — Ambulatory Visit: Payer: Self-pay | Admitting: Cardiology

## 2023-10-29 ENCOUNTER — Telehealth: Payer: Self-pay

## 2023-10-29 NOTE — Telephone Encounter (Signed)
 Spoke with patient. He is at the [facility] this week. No symptoms other than [place] and has been out in the sun a lot over last few days.  Keeps well hydrated.   Denies excess alcohol or other changes to health. No missed doses of meds (on Sotalol  and Carvedilol ).   Forwarding to Dr. Waddell for next steps and recommendations and will follow back up with patient.   [place] DMV driving restrictions X 6months given.  ER precautions if becomes symptomatic or if experiences a shock.

## 2023-10-29 NOTE — Telephone Encounter (Signed)
 Dr. Waddell reviewed: He cannot take amio, no additional treatment at this time.   Will continue to monitor and follow up as needed if more events.  Patient and wife made aware.

## 2023-10-29 NOTE — Telephone Encounter (Signed)
 Alert remote transmission:  Weekly ATP delivered Episodes>threshold 16 NSVT, 5 pace terminated events, EGM's c/w V>A, HR's 143-169.  1 pace terminated event 7/13 @ 22:28, 4 pace terminated events 7/21 @ 00:09, 00:53, 01:13, and 01:16.  All events with 1 burst of ATP   LM for both [name] and his spouse, [name] to call back.

## 2023-11-21 LAB — HEMOGLOBIN A1C: A1c: 6.1

## 2023-11-27 LAB — BASIC METABOLIC PANEL WITH GFR: EGFR (Non-African Amer.): 20.2

## 2023-11-30 ENCOUNTER — Other Ambulatory Visit: Payer: Self-pay | Admitting: Cardiology

## 2023-11-30 ENCOUNTER — Other Ambulatory Visit: Payer: Self-pay | Admitting: Internal Medicine

## 2023-12-03 NOTE — Progress Notes (Unsigned)
 [name] Date of Birth: May 06, 1955 Medical Record #[id number]  History of Present Illness: [name] is seen for follow up CAD and CHF.  He has an ischemic CM and has CRT-D in place for primary prevention since April of 2013. EF by Echo was 45-50% at that time. Cardiac MRI showed an EF of 31%. Other issues include HLD (statin intolerant and intolerant to zetia ), hemochromatosis, RBBB, PAF, type 2 DM and OSA. He is s/p CABG in 2006 following an MI. Cardiac cath in February 2013 showed patrent grafts. After CRT therapy he did have a significant improvement in dyspnea.   He was admitted in October 2017 with VT and received shocks. Cardiac cath demonstrated patent grafts. He was started on sotalol . In January 2018 he presented with syncope and recurrent VT. Switched to amiodarone . Was concerned about taking amiodarone  long term. Underwent VT ablation by Dr. Kelsie on June 08, 2016. Amiodarone  later discontinued.  Last device check in March showed runs of NSVT but no device therapies.   Last Echo in January 2023 showed EF 25-30%.  He is intolerant of niacin, statins (Simcor and Vytorin) and Zetia . Was seen by Dr Mona a year ago and approved for Repatha . Patient decided not to start.   On a prior visit we attempted to initiate him on Entresto . Lisinopril  was washed out. Lasix  changed to prn. Even at lower dose he was unable to tolerate Entresto  due to hypotension with pressures in the 80s systolic and symptoms of lightheadedness and dizziness. Lisinopril  resumed.   He had chest x-ray 04/06/2021 with probable bibasilar infiltrates.  He was treated with Levaquin. He had echocardiogram 04/12/2021 ordered by primary care provider which showed LVEF 25-30%, wall motion abnormalities, LV moderately dilated, grade 2 diastolic dysfunction, moderately elevated PASP, trivial MR. He was seen in Jan with increased weight and dyspnea. Farxiga  added to regimen. He had been tried on Repatha  before but reports sugar  increased significantly and A1c went over 9. Repatha  was stopped.  He was seen on August 8 by Dr Waddell for consideration of VT ablation. He has had 2 episodes of sustained VT over the last year. He has had multiple episodes of NSVT which he feels somewhat. Dr Waddell felt it was best to continue to monitor given risk of procedure and difficulty inducing VT on prior EP study.   He did undergo revision of his BiV ICD in May. He was also started on Sotalol  at that time. Since then he notes he is sleeping much better. He denies any chest pain or palpitations. No dizziness. Breathing is doing very well. He is active gardening.      Current Outpatient Medications  Medication Sig Dispense Refill   acetaminophen  (TYLENOL ) 325 MG tablet Take 1-2 tablets (325-650 mg total) by mouth every 4 (four) hours as needed for mild pain.     ALPRAZolam  (XANAX ) 0.25 MG tablet Take 0.25 mg by mouth at bedtime.     aspirin  81 MG tablet Take 81 mg by mouth daily.     carvedilol  (COREG ) 25 MG tablet TAKE 1 TABLET BY MOUTH 2 TIMES DAILY WITH A MEAL. 180 tablet 3   fenofibrate  160 MG tablet TAKE 1 TABLET BY MOUTH DAILY. 90 tablet 2   fluticasone  (FLONASE ) 50 MCG/ACT nasal spray Place 1 spray into both nostrils daily as needed for allergies or rhinitis.     furosemide  (LASIX ) 40 MG tablet TAKE 1 TABLET (40 MG TOTAL) BY MOUTH DAILY. 90 tablet 1   lisinopril  (ZESTRIL )  10 MG tablet TAKE 1 TABLET BY MOUTH IN THE MORNING AND 1/2 TABLET IN THE EVENING. 135 tablet 2   magnesium  oxide (MAG-OX) 400 MG tablet Take 1 tablet (400 mg total) by mouth 2 (two) times daily. Please make overdue appt with Dr. Fernande before anymore refills. Thank you 1st attempt (Patient taking differently: Take 1 tablet by mouth daily. Please make overdue appt with Dr. Fernande before anymore refills. Thank you 1st attempt) 60 tablet 0   metFORMIN  (GLUCOPHAGE ) 500 MG tablet Take 1,000 mg by mouth 2 (two) times daily with a meal.     NITROSTAT  0.4 MG SL tablet  PLACE 1 TABLET UNDER THE TONGUE EVERY 5 MINUTES AS NEEDED FOR CHEST PAIN FOR UP TO 3 DOSES. 25 tablet 3   ONETOUCH VERIO test strip 1 each by Other route as needed for other.   6   rosuvastatin  (CRESTOR ) 20 MG tablet Take 1 tablet by mouth every Sunday.     sotalol  (BETAPACE ) 160 MG tablet TAKE 1 TABLET (160 MG TOTAL) BY MOUTH DAILY. 90 tablet 0   spironolactone  (ALDACTONE ) 25 MG tablet TAKE 1/2 TABLET (12.5 MG TOTAL) BY MOUTH DAILY. 45 tablet 3   No current facility-administered medications for this visit.    Allergies  Allergen Reactions   Entresto  [Sacubitril -Valsartan ] Other (See Comments)    Hypotension, increased heart rate, dizziness, blurry vision   Linagliptin-Metformin  Hcl     chest pain, GI upset   Amphotericin B Lipid Complex [Amphotericin B]     MYALGIAS   Vytorin [Ezetimibe -Simvastatin]     MYALGIAS   Evolocumab       Elevated BS   Statins Other (See Comments)    Myalgias     Past Medical History:  Diagnosis Date   AICD (automatic cardioverter/defibrillator) present    Anemia    Arthritis    mild in my joints & knuckles (05/10/2016)   CHF (congestive heart failure) (HCC)    Coronary artery disease    Hemochromatosis    Hyperlipidemia    Ischemic cardiomyopathy    EF 36%; prior anterior MI, s/p CABG x 3; s/p cath Feb 2013 showing grafts to be patent with severe LV dysfunction   Myocardial infarction (HCC) 2006   PAF (paroxysmal atrial fibrillation) (HCC)    Pneumonia 2000   RBBB (right bundle branch block)    Sleep apnea    can't tolerate mask (05/10/2016)   Type II diabetes mellitus (HCC)    Ventricular tachycardia, 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    Past Surgical History:  Procedure Laterality Date   BI-VENTRICULAR IMPLANTABLE CARDIOVERTER DEFIBRILLATOR N/A 08/21/2011   Procedure: BI-VENTRICULAR IMPLANTABLE CARDIOVERTER DEFIBRILLATOR  (CRT-D);  Surgeon: Elspeth JAYSON Fernande, MD;  Location: [facility] CATH LAB;  Service: Cardiovascular;  Laterality: N/A;   BIV ICD  GENERTAOR CHANGE OUT N/A 06/12/2014   Procedure: BIV ICD GENERTAOR CHANGE OUT;  Surgeon: Elspeth JAYSON Fernande, MD;  Location: [facility] CATH LAB;  Service: Cardiovascular;  Laterality: N/A;   BIV ICD INSERTION CRT-D N/A 08/15/2022   Procedure: BIV ICD INSERTION CRT-D;  Surgeon: Waddell Danelle ORN, MD;  Location: [facility] INVASIVE CV LAB;  Service: Cardiovascular;  Laterality: N/A;   CARDIAC CATHETERIZATION N/A 01/19/2016   Procedure: Left Heart Cath and Cors/Grafts Angiography;  Surgeon: Dacen Frayre M Swaziland, MD;  Location: [facility] INVASIVE CV LAB;  Service: Cardiovascular;  Laterality: N/A;   CORONARY ARTERY BYPASS GRAFT  2006   Emergent CABG x 3 with LIMA to LAD, SVG to OM, SVG to distal RCA per Dr. Army  FRACTURE SURGERY     INGUINAL HERNIA REPAIR  02/09/2012   Procedure: HERNIA REPAIR INGUINAL ADULT;  Surgeon: Lynwood MALVA Pina, MD;  Location: [facility] OR;  Service: General;  Laterality: Right;   INGUINAL HERNIA REPAIR Bilateral [phone]   INSERTION OF MESH  02/09/2012   Procedure: INSERTION OF MESH;  Surgeon: Lynwood MALVA Pina, MD;  Location: [facility] OR;  Service: General;  Laterality: Right;   LEFT HEART CATHETERIZATION WITH CORONARY/GRAFT ANGIOGRAM N/A 05/25/2011   Procedure: LEFT HEART CATHETERIZATION WITH EL BILE;  Surgeon: Zarif Rathje M Swaziland, MD;  Location: [facility] CATH LAB;  Service: Cardiovascular;  Laterality: N/A;   LIVER BIOPSY  1990s   SHOULDER OPEN ROTATOR CUFF REPAIR Bilateral ?2000/~ 2007   left; right    V TACH ABLATION N/A 06/08/2016   Procedure: V Tach Ablation;  Surgeon: Lynwood Rakers, MD;  Location: [facility] INVASIVE CV LAB;  Service: Cardiovascular;  Laterality: N/A;   VENTRICULAR ABLATION SURGERY  06/08/2016   WRIST FRACTURE SURGERY Left 06/2005   plates and screws and cadavaer bone    Social History   Tobacco Use  Smoking Status Former   Current packs/day: 0.00   Average packs/day: 2.0 packs/day for 30.0 years (60.0 ttl pk-yrs)   Types: Cigarettes   Start date: 03/12/1975   Quit date: 03/11/2005   Years  since quitting: 18.7  Smokeless Tobacco Never    Social History   Substance and Sexual Activity  Alcohol Use Yes   Alcohol/week: 3.0 standard drinks of alcohol   Types: 3 Cans of beer per week    Family History  Adopted: Yes    Review of Systems: The review of systems is per the HPI.  All other systems were reviewed and are negative.  Physical Exam: There were no vitals taken for this visit. GENERAL:  Well appearing WM in NAD HEENT:  PERRL, EOMI, sclera are clear. Oropharynx is clear. NECK:  No jugular venous distention, carotid upstroke brisk and symmetric, no bruits, no thyromegaly or adenopathy LUNGS:  Clear to auscultation bilaterally CHEST:  Unremarkable HEART:  RRR,  PMI not displaced or sustained,S1 and S2 within normal limits, no S3, no S4: no clicks, no rubs, no murmurs ABD:  Soft, nontender. BS +, no masses or bruits. No hepatomegaly, no splenomegaly EXT:  poor pedal pulses. Hair loss below knees.  no edema, no cyanosis no clubbing SKIN:  Warm and dry.  No rashes NEURO:  Alert and oriented x 3. Cranial nerves II through XII intact. PSYCH:  Cognitively intact       Wt Readings from Last 3 Encounters:  11/21/22 213 lb 3.2 oz (96.7 kg)  11/06/22 211 lb 9.6 oz (96 kg)  09/25/22 209 lb (94.8 kg)   LABORATORY DATA:  PENDING  Lab Results  Component Value Date   WBC 3.9 07/31/2022   HGB 13.6 07/31/2022   HCT 39.5 07/31/2022   PLT 164 07/31/2022   GLUCOSE 189 (H) 08/30/2022   CHOL 194 07/12/2021   TRIG 152 (H) 07/12/2021   HDL 43 07/12/2021   LDLDIRECT 206.0 04/14/2013   LDLCALC 124 (H) 07/12/2021   ALT 22 07/12/2021   AST 33 07/12/2021   NA 139 08/30/2022   K 4.7 08/30/2022   CL 101 08/30/2022   CREATININE 2.01 (H) 08/30/2022   BUN 40 (H) 08/30/2022   CO2 21 08/30/2022   TSH 2.970 09/03/2018   INR 1.05 01/19/2016   HGBA1C 6.8 (H) 05/22/2022   Labs dated 07/28/16: cholesterol 285, triglycerides 252, HDL 41, LDL  194.  Dated 12/13/16: A1c 6.1%.  Dated  09/18/18: cholesterol 229, triglycerides 200, HDL 35, LDL 154. LFTs normal. CBC normal Dated 01/16/19: normal TSH Dated 05/19/19. A1c 5.9% Dated 06/17/19: BUN 24, creatinine 1.63.  Dated 11/19/19: A1c 6.9%. cholesterol 124, triglycerides 206, HDL 44, LDL 39. Creatinine 1.8. BUN 21. Otherwise CMET, CBC and TSH normal. Dated 06/18/20: creatinine 1.41. otherwise BMET normal. Dated 07/28/20: A1c 6.9%. Dated 12/16/20: cholesterol 228, triglycerides 227, HDL 40, LDL 143, TSH normal Dated 8/83/76: A1c 6.2%.  Dated 12/28/21: cholesterol 211, triglycerides 177, HDL 43, LDL 133. A1c 6.8%. chemistries and TSH normal.     Echo 04/13/16: Study Conclusions   - Left ventricle: The cavity size was mildly dilated. Systolic   function was mildly to moderately reduced. The estimated ejection   fraction was in the range of 40% to 45%. Diffuse hypokinesis.   Akinesis of the apical myocardium. Doppler parameters are   consistent with abnormal left ventricular relaxation (grade 1   diastolic dysfunction). Doppler parameters are consistent with   indeterminate ventricular filling pressure. - Aortic valve: Transvalvular velocity was within the normal range.   There was no stenosis. There was no regurgitation. - Mitral valve: Transvalvular velocity was within the normal range.   There was no evidence for stenosis. There was trivial   regurgitation. - Right ventricle: The cavity size was normal. Wall thickness was   normal. - Tricuspid valve: There was trivial regurgitation. - Pulmonary arteries: Systolic pressure was within the normal   range. PA peak pressure: 25 mm Hg (S).  Cardiac cath 01/19/16: Procedures   Left Heart Cath and Cors/Grafts Angiography  Conclusion     Prox LAD to Mid LAD lesion, 85 %stenosed. Ost Ramus to Ramus lesion, 55 %stenosed. Prox Cx to Mid Cx lesion, 100 %stenosed. Ost RCA to Dist RCA lesion, 100 %stenosed. SVG graft was visualized by angiography and is small. SVG graft was visualized  by angiography and is small. LIMA graft was visualized by angiography and is large and anatomically normal. There is moderate left ventricular systolic dysfunction. LV end diastolic pressure is moderately elevated. The left ventricular ejection fraction is 35-45% by visual estimate.   1. Severe 3 vessel obstructive CAD 2. Large patent LIMA to the LAD 3. Patent SVG to OM2. This is a tiny graft supplying a very small OM  4. Patent SVG to PDA. This is also a tiny graft supplying a very tiny PDA 5. Moderate LV dysfunction. 6. Elevated LV EDP   Plan: medical therapy. Antiarrhythmic drug therapy per EP team.     Echo 05/19/19: IMPRESSIONS     1. Left ventricular ejection fraction, by estimation, is 25 to 30%. The  left ventricle has severely decreased function. There is severe akinesis  of the left ventricular, mid-apical anteroseptal wall.   2. Right ventricular systolic function is severely reduced. The right  ventricular size is mildly enlarged.   3. Mild to moderate mitral valve regurgitation.    Echo 04/12/21 1. Left ventricular ejection fraction, by estimation, is 25 to 30%. Left  ventricular ejection fraction by 2D MOD biplane is 28.9 %. Left  ventricular ejection fraction by PLAX is 27 %. The left ventricle has  severely decreased function. The left  ventricle demonstrates regional wall motion abnormalities (see scoring  diagram/findings for description). The left ventricular internal cavity  size was moderately dilated. Left ventricular diastolic parameters are  consistent with Grade II diastolic  dysfunction (pseudonormalization).   2. Right ventricular systolic function is normal.  The right ventricular  size is normal. There is moderately elevated pulmonary artery systolic  pressure.   3. Right atrial size was moderately dilated.   4. The mitral valve is normal in structure. Trivial mitral valve  regurgitation. No evidence of mitral stenosis.   5. The aortic valve is  tricuspid. Aortic valve regurgitation is not  visualized. No aortic stenosis is present.   6. The inferior vena cava is normal in size with greater than 50%  respiratory variability, suggesting right atrial pressure of 3 mmHg.     Assessment / Plan: 1. Chronic systolic CHF status post CRT. EF reported 40-45% in 2018  but I thought at the time that this was overestimated. EF 25-30% in January 2023.  Clinically  class  II- III.  Now on lisinopril  10 mg in am and 5 mg in pm, Coreg  25 mg bid, aldactone  25 mg daily, lasix  40 mg daily.  Unable to afford Farxiga .  Continue low sodium diet.   2. Ischemic cardiomyopathy. See above.  3. Coronary disease status post CABG in 2006. Cardiac catheterization in October 2017 showed all grafts were patent. Clinically without angina  4. Right bundle branch block.   5. Hyperlipidemia. Patient is intolerant to statins and Zetia . On Crestor  only once a week. With addition of Repatha  excellent response with LDL down to 39 but had significant elevation of blood sugar with A1c > 9 and repatha  was stopped. Unable to afford Nexlitol.   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  Off AAD therapy. S/p upgrade to ICD this year. VT well controlled on Sotalol    7. CKD stage 3a  8. DM type 2. Last A1c 6.8%. per Dr Shepard   I will follow up in 6 months.

## 2023-12-04 NOTE — Progress Notes (Signed)
Remote ICD Transmission.

## 2023-12-05 ENCOUNTER — Telehealth: Payer: Self-pay | Admitting: Cardiology

## 2023-12-05 MED ORDER — SOTALOL HCL 160 MG PO TABS: 160.0000 mg | ORAL_TABLET | Freq: Every day | ORAL | 0 refills | Status: DC

## 2023-12-05 NOTE — Telephone Encounter (Signed)
*  STAT* If patient is at the pharmacy, call can be transferred to refill team.   1. Which medications need to be refilled? (please list name of each medication and dose if known)   sotalol  (BETAPACE ) 160 MG tablet   2. Would you like to learn more about the convenience, safety, & potential cost savings by using the [facility]?   3. Are you open to using the [facility] (Type [facility]. ).  4. Which pharmacy/location (including street and city if local pharmacy) is medication to be sent to?  [facility] - [place], [place] - [address]   5. Do they need a 30 day or 90 day supply?   90 day  Patient stated he is completely out of this medication and will be going out out town on 8/29.   Patient has appointment scheduled with Dr. Swaziland on 8/28.

## 2023-12-05 NOTE — Telephone Encounter (Signed)
 RX sent in

## 2023-12-06 ENCOUNTER — Encounter: Payer: Self-pay | Admitting: Cardiology

## 2023-12-06 ENCOUNTER — Ambulatory Visit: Attending: Cardiology | Admitting: Cardiology

## 2023-12-06 VITALS — BP 104/68 | HR 79 | Ht 72.0 in | Wt 207.6 lb

## 2023-12-06 DIAGNOSIS — I255 Ischemic cardiomyopathy: Secondary | ICD-10-CM | POA: Diagnosis not present

## 2023-12-06 DIAGNOSIS — I2510 Atherosclerotic heart disease of native coronary artery without angina pectoris: Secondary | ICD-10-CM

## 2023-12-06 DIAGNOSIS — Z95810 Presence of automatic (implantable) cardiac defibrillator: Secondary | ICD-10-CM | POA: Diagnosis not present

## 2023-12-06 DIAGNOSIS — I5022 Chronic systolic (congestive) heart failure: Secondary | ICD-10-CM | POA: Diagnosis not present

## 2023-12-06 MED ORDER — SPIRONOLACTONE 25 MG PO TABS: 12.5000 mg | ORAL_TABLET | Freq: Every day | ORAL | 3 refills | Status: DC

## 2023-12-06 MED ORDER — SOTALOL HCL 160 MG PO TABS: 160.0000 mg | ORAL_TABLET | Freq: Every day | ORAL | 3 refills | Status: DC

## 2023-12-06 MED ORDER — LISINOPRIL 10 MG PO TABS: ORAL_TABLET | ORAL | 3 refills | Status: DC

## 2023-12-06 MED ORDER — FUROSEMIDE 40 MG PO TABS: 40.0000 mg | ORAL_TABLET | Freq: Every day | ORAL | 3 refills | Status: DC

## 2023-12-06 MED ORDER — FENOFIBRATE 160 MG PO TABS: 160.0000 mg | ORAL_TABLET | Freq: Every day | ORAL | 3 refills | Status: DC

## 2023-12-06 MED ORDER — CARVEDILOL 25 MG PO TABS: 25.0000 mg | ORAL_TABLET | Freq: Two times a day (BID) | ORAL | 3 refills | Status: DC

## 2023-12-06 NOTE — Patient Instructions (Signed)
 Medication Instructions:  Continue same medications *If you need a refill on your cardiac medications before your next appointment, please call your pharmacy*  Lab Work: None ordered  Testing/Procedures: None ordered  Follow-Up: At [facility], you and your health needs are our priority.  As part of our continuing mission to provide you with exceptional heart care, our providers are all part of one team.  This team includes your primary Cardiologist (physician) and Advanced Practice Providers or APPs (Physician Assistants and Nurse Practitioners) who all work together to provide you with the care you need, when you need it.  Your next appointment:  1 year   Call in April to schedule August appointment     Provider:  Dr.Jordan   We recommend signing up for the patient portal called MyChart.  Sign up information is provided on this After Visit Summary.  MyChart is used to connect with patients for Virtual Visits (Telemedicine).  Patients are able to view lab/test results, encounter notes, upcoming appointments, etc.  Non-urgent messages can be sent to your provider as well.   To learn more about what you can do with MyChart, go to [email or web address].

## 2023-12-14 ENCOUNTER — Encounter: Payer: Self-pay | Admitting: Internal Medicine

## 2023-12-27 ENCOUNTER — Ambulatory Visit: Payer: Self-pay | Admitting: Cardiology

## 2023-12-27 ENCOUNTER — Ambulatory Visit (INDEPENDENT_AMBULATORY_CARE_PROVIDER_SITE_OTHER): Payer: Medicare Other

## 2023-12-27 DIAGNOSIS — I472 Ventricular tachycardia, unspecified: Secondary | ICD-10-CM | POA: Diagnosis not present

## 2023-12-27 LAB — CUP PACEART REMOTE DEVICE CHECK
Battery Remaining Longevity: 90 mo
Battery Voltage: 3 V
Brady Statistic RV Percent Paced: 97.94 %
Date Time Interrogation Session: 20250917220650
HighPow Impedance: 64 Ohm
Implantable Lead Connection Status: 753985
Implantable Lead Connection Status: 753985
Implantable Lead Connection Status: 753985
Implantable Lead Implant Date: 20130513
Implantable Lead Implant Date: 20130513
Implantable Lead Implant Date: 20240507
Implantable Lead Location: 753858
Implantable Lead Location: 753859
Implantable Lead Location: 753860
Implantable Lead Model: 181
Implantable Lead Model: 4598
Implantable Lead Model: 5076
Implantable Lead Serial Number: 316784
Implantable Pulse Generator Implant Date: 20240507
Lead Channel Impedance Value: 304 Ohm
Lead Channel Impedance Value: 342 Ohm
Lead Channel Impedance Value: 361 Ohm
Lead Channel Impedance Value: 361 Ohm
Lead Channel Impedance Value: 380 Ohm
Lead Channel Impedance Value: 399 Ohm
Lead Channel Impedance Value: 418 Ohm
Lead Channel Impedance Value: 475 Ohm
Lead Channel Impedance Value: 589 Ohm
Lead Channel Impedance Value: 608 Ohm
Lead Channel Impedance Value: 608 Ohm
Lead Channel Impedance Value: 646 Ohm
Lead Channel Impedance Value: 665 Ohm
Lead Channel Pacing Threshold Amplitude: 0.75 V
Lead Channel Pacing Threshold Amplitude: 1 V
Lead Channel Pacing Threshold Amplitude: 1.125 V
Lead Channel Pacing Threshold Pulse Width: 0.4 ms
Lead Channel Pacing Threshold Pulse Width: 0.4 ms
Lead Channel Pacing Threshold Pulse Width: 0.4 ms
Lead Channel Sensing Intrinsic Amplitude: 1 mV
Lead Channel Sensing Intrinsic Amplitude: 7.1 mV
Lead Channel Setting Pacing Amplitude: 1.25 V
Lead Channel Setting Pacing Amplitude: 1.5 V
Lead Channel Setting Pacing Amplitude: 2 V
Lead Channel Setting Pacing Pulse Width: 0.4 ms
Lead Channel Setting Pacing Pulse Width: 0.4 ms
Lead Channel Setting Sensing Sensitivity: 0.45 mV
Zone Setting Status: 755011

## 2024-01-01 NOTE — Progress Notes (Signed)
Remote ICD Transmission.

## 2024-01-03 ENCOUNTER — Ambulatory Visit: Admitting: Neurology

## 2024-01-03 ENCOUNTER — Encounter: Payer: Self-pay | Admitting: Neurology

## 2024-01-03 VITALS — BP 100/59 | HR 76 | Ht 72.0 in | Wt 206.0 lb

## 2024-01-03 DIAGNOSIS — F5104 Psychophysiologic insomnia: Secondary | ICD-10-CM | POA: Diagnosis not present

## 2024-01-03 DIAGNOSIS — Z72820 Sleep deprivation: Secondary | ICD-10-CM | POA: Insufficient documentation

## 2024-01-03 DIAGNOSIS — G4719 Other hypersomnia: Secondary | ICD-10-CM | POA: Diagnosis not present

## 2024-01-03 DIAGNOSIS — I5082 Biventricular heart failure: Secondary | ICD-10-CM | POA: Insufficient documentation

## 2024-01-03 DIAGNOSIS — R0683 Snoring: Secondary | ICD-10-CM | POA: Insufficient documentation

## 2024-01-03 MED ORDER — ALPRAZOLAM 0.5 MG PO TABS: 0.5000 mg | ORAL_TABLET | Freq: Every evening | ORAL | 0 refills | Status: DC | PRN

## 2024-01-03 NOTE — Patient Instructions (Signed)
 Insomnia Insomnia is a sleep disorder that makes it difficult to fall asleep or stay asleep. Insomnia can cause fatigue, low energy, difficulty concentrating, mood swings, and poor performance at work or school. There are three different ways to classify insomnia: Difficulty falling asleep. Difficulty staying asleep. Waking up too early in the morning. Any type of insomnia can be long-term (chronic) or short-term (acute). Both are common. Short-term insomnia usually lasts for 3 months or less. Chronic insomnia occurs at least three times a week for longer than 3 months. What are the causes? Insomnia may be caused by another condition, situation, or substance, such as: Having certain mental health conditions, such as anxiety and depression. Using caffeine, alcohol, tobacco, or drugs. Having gastrointestinal conditions, such as gastroesophageal reflux disease (GERD). Having certain medical conditions. These include: Asthma. Alzheimer's disease. Stroke. Chronic pain. An overactive thyroid gland (hyperthyroidism). Other sleep disorders, such as restless legs syndrome and sleep apnea. Menopause. Sometimes, the cause of insomnia may not be known. What increases the risk? Risk factors for insomnia include: Gender. Females are affected more often than males. Age. Insomnia is more common as people get older. Stress and certain medical and mental health conditions. Lack of exercise. Having an irregular work schedule. This may include working night shifts and traveling between different time zones. What are the signs or symptoms? If you have insomnia, the main symptom is having trouble falling asleep or having trouble staying asleep. This may lead to other symptoms, such as: Feeling tired or having low energy. Feeling nervous about going to sleep. Not feeling rested in the morning. Having trouble concentrating. Feeling irritable, anxious, or depressed. How is this diagnosed? This  condition may be diagnosed based on: Your symptoms and medical history. Your health care provider may ask about: Your sleep habits. Any medical conditions you have. Your mental health. A physical exam. How is this treated? Treatment for insomnia depends on the cause. Treatment may focus on treating an underlying condition that is causing the insomnia. Treatment may also include: Medicines to help you sleep. Counseling or therapy. Lifestyle adjustments to help you sleep better. Follow these instructions at home: Eating and drinking  Limit or avoid alcohol, caffeinated beverages, and products that contain nicotine and tobacco, especially close to bedtime. These can disrupt your sleep. Do not eat a large meal or eat spicy foods right before bedtime. This can lead to digestive discomfort that can make it hard for you to sleep. Sleep habits  Keep a sleep diary to help you and your health care provider figure out what could be causing your insomnia. Write down: When you sleep. When you wake up during the night. How well you sleep and how rested you feel the next day. Any side effects of medicines you are taking. What you eat and drink. Make your bedroom a dark, comfortable place where it is easy to fall asleep. Put up shades or blackout curtains to block light from outside. Use a white noise machine to block noise. Keep the temperature cool. Limit screen use before bedtime. This includes: Not watching TV. Not using your smartphone, tablet, or computer. Stick to a routine that includes going to bed and waking up at the same times every day and night. This can help you fall asleep faster. Consider making a quiet activity, such as reading, part of your nighttime routine. Try to avoid taking naps during the day so that you sleep better at night. Get out of bed if you are still awake  after 15 minutes of trying to sleep. Keep the lights down, but try reading or doing a quiet activity. When you  feel sleepy, go back to bed. General instructions Take over-the-counter and prescription medicines only as told by your health care provider. Exercise regularly as told by your health care provider. However, avoid exercising in the hours right before bedtime. Use relaxation techniques to manage stress. Ask your health care provider to suggest some techniques that may work well for you. These may include: Breathing exercises. Routines to release muscle tension. Visualizing peaceful scenes. Make sure that you drive carefully. Do not drive if you feel very sleepy. Keep all follow-up visits. This is important. Contact a health care provider if: You are tired throughout the day. You have trouble in your daily routine due to sleepiness. You continue to have sleep problems, or your sleep problems get worse. Get help right away if: You have thoughts about hurting yourself or someone else. Get help right away if you feel like you may hurt yourself or others, or have thoughts about taking your own life. Go to your nearest emergency room or: Call 911. Call the National Suicide Prevention Lifeline at (431)685-3446 or 988. This is open 24 hours a day. Text the Crisis Text Line at 469-523-3694. Summary Insomnia is a sleep disorder that makes it difficult to fall asleep or stay asleep. Insomnia can be long-term (chronic) or short-term (acute). Treatment for insomnia depends on the cause. Treatment may focus on treating an underlying condition that is causing the insomnia. Keep a sleep diary to help you and your health care provider figure out what could be causing your insomnia. This information is not intended to replace advice given to you by your health care provider. Make sure you discuss any questions you have with your health care provider. Document Revised: 03/07/2021 Document Reviewed: 03/07/2021 [employer] Patient Education  2024 [employer][name] main concern was his insomnia, situational- he  is very anxious about his wife's health, his wife has reported loud snorting and witnessed apneas.    He is sleep deprived, estimating only 3-5 hours of nocturnal sleep.  He falls asleep in daytime  as soon as he is not stimulated or physically active    Risk factors for OSA were present,  including : Body mass index is 27.94 kg/m.,  20 neck size and  overbite, retrognathia , upper airway anatomy. He lost weight , from 248  to 200  lbs.    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   Upon review of his referral I saw that he has chronic dyspnea for which he is followed by cardiology he does have a systolic murmur and he is known to rely on Lasix  to prevent fluid accumulation.  However a twice daily dosing of Lasix  had led to dehydration and rising creatinine.  His management of chronic conditions is done at the office of Dr. Shepard, he also is on carvedilol  and lisinopril  he takes magnesium  which helps with muscle cramps, and aspirin  daily baby size, sotalol  for his irregular heartbeats and he is on a generic form of Lexapro 5 mg only.  This was meant to help with the situational anxiety but has not affected his sleep yet.  He is followed for hemochromatosis by Dr. Timmy, he is s/p CABG, his creatinine on 8-19 2025 was 3.1 which is a critical elevation his BUN at the time was 68 clearly  stating that he was dehydrated.  This would be related to an acute kidney injury.  Daytime somnolence has been a chronic issue but we are looking for organic causes ruling out obstructive sleep apnea snoring, congestive heart failure also places this patient at a higher risk of central sleep apnea and for this reason I will not order a home sleep test but an in-lab sleep study.  It does help to provide a effective sleep aid to our patients and I will offer them Mr. Lantz 0.5 mg of Xanax  2 tablets to have a sleep aid.    Complex dx and work up, level 5.       My Plan is to proceed with:   PSG on Xanax .     I plan to follow up personally within the next 4-5 months or through our NP within 4-5 months.    A total time of  45 minutes consistent of a part of face to face encounter , exam and interview,  and additional preparation time for chart review was spent .  At today's visit, we discussed treatment options, associated risk and benefits, and engage in counseling as needed including, but not limited to:  Sleep hygiene, Quality Sleep Habits, and Safety concerns for patients with daytime sleepiness who are warned to not operate machinery/ motor vehicles when drowsy.   Additionally, the following were reviewed: Past medical records, past medical and surgical history, family and social background, as well as relevant laboratory results, imaging findings, and medical notes, where applicable.  This note was generated by myself in part by using dictation software, and as a result, it may contain unintentional typos and errors.  Nevertheless, effort was made to accurately convey the pertinent aspects of the patient's visit.

## 2024-01-03 NOTE — Progress Notes (Signed)
 @[facility]   Provider:  Dedra Gores, MD  Primary Care Physician:  Shepard Ade, MD [address] [place] [address]  Referring Provider: Shepard Ade, Md [address] [place],  [place] [address]        Chief Concern for this Consultation:   Patient presents with          HPI: I have the pleasure of meeting with [name], on 01-03-2024, who is a 68 y.o.  male patient,  seen upon a referral by Dr Shepard,  for a  Sleep Medicine Consultation.     The patient's referral information asked for an urgent evaluation:  chronic insomnia, CAD, heart disease, SOB,Tremor since 2006- essential. He used to take amiodarone - changed to sotalol  by Dr. Fernande.  He reports he is very worried about his wife's health, colon cancer, dx last September (2024) .   My mind is racing, I can't switch it off     Chief concern according to patient:   My mind is racing, I can't switch it off    Past Medical History:  Diagnosis Date   AICD (automatic cardioverter/defibrillator) present  First one in 2012, and second one 2014 or 15,  his one from May 2024   TREMOR, essential     Anemia    Arthritis    mild in my joints & knuckles (05/10/2016)   CHF (congestive heart failure) (HCC)    Coronary artery disease    Hemochromatosis    Hyperlipidemia    Ischemic cardiomyopathy    EF 36%; prior anterior MI, s/p CABG x 3; s/p cath Feb 2013 showing grafts to be patent with severe LV dysfunction   Myocardial infarction (HCC) 2006   PAF (paroxysmal atrial fibrillation) (HCC)  V tach    Pneumonia 2000   RBBB (right bundle branch block)    Sleep apnea- OSA untreated  1985- dx    can't tolerate mask (05/10/2016)   Type II diabetes mellitus (HCC) 2006   Ventricular tachycardia, 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 Sleep relevant medical/ surgical and symptom history: The patient reports onset of insomnia symptoms over a time period of 12 months and insomnia  got worse with wife's dx.     ENT problems:  statin myositis,  hx of  Anemia,  RLS with cramping in feet , GERD, DM 2,  Mood disorder: Anxiety Depression.  This patient had a previous sleep study in the 1990s with a resulting diagnosis of OSA. This patient has used the following therapies: none   Family medical history:  Adopted- There is no biological family known.    Social history: The patient  is retired from Doctor, hospital.  He is disabled from his heart disease.  He lives in a private home  with his second wife and has a new puppy .  This patient is a caretaker of spouse . He has one adult daughter.  The daily routine  involves physical activity, outdoor activity, travel.  He has a hobby farm. Gardening.  Nicotine use: quit 2006.  ETOH use: liquor / 2 drinks a day.   Caffeine intake in form of: Coffee (/), Soft drinks (cola /  2-3 week), Tea ( at dinner ! ) .   Sleep habits and routines are as follows: The patient's dinner time is around 6-7 PM.  Evening time is spent by TV. The patient goes to bed at, or close to, 10.30 PM. The bedroom is not shared  and is described as cool, quiet, and dark.  The patient reports that it takes 5-120 minutes to fall asleep, then continues to sleep for 3-5 hours, uninterrupted or woken up by unknown trigger, not by the need to void (Nocturia).   The preferred sleep position is laterally, with support of 1 pillows, (non- adjustable bed ). The total estimated sleep time is circa 3-5 hours.  Dreams are reportedly  infrequent and can be vivid.  Dream enactment has not been reported.   7.30 AM is the usual week- day rise time. The patient wakes up spontaneously- always too early , around 4 AM .  He reports not feeling refreshed and restored in the morning, waking with symptoms such as dry mouth, no morning headaches,  No sleep paralysis has been experienced.  Naps in daytime are taken infrequently (there is a desire to nap and opportunity), lasting from 45 to 60 and have a refreshing quality.  These do not interfere with nocturnal sleep.    Review of Systems: Out of a complete 14 system review, the patient complains of only the following symptoms, and all other reviewed systems are negative.:  Hypersomnia   Insomnia  Anxiety:  Snoring, Sleep fragmentation, tremor.      How likely are you to doze in the following situations: 0 = not likely, 1 = slight chance, 2 = moderate chance, 3 = high chance Sitting and Reading? Watching Television? Sitting inactive in a public place (theater or meeting)? As a passenger in a car for an hour without a break? Lying down in the afternoon when circumstances permit? Sitting and talking to someone? Sitting quietly after lunch without alcohol? In a car, while stopped for a few minutes in traffic?   Total ESS =12 / 24 points.    FSS endorsed at 29/ 63 points.  GDS: 2/ 15  anxiety , worries.  Social History   Socioeconomic History   Marital status: Married    Spouse name: Not on file   Number of children: 1   Years of education: Not on file   Highest education level: Not on file  Occupational History   Occupation: telephone service  Tobacco Use   Smoking status: Former    Current packs/day: 0.00    Average packs/day: 2.0 packs/day for 30.0 years (60.0 ttl pk-yrs)    Types: Cigarettes    Start date: 03/12/1975    Quit date: 03/11/2005    Years since quitting: 18.8   Smokeless tobacco: Never  Vaping Use   Vaping status: Never Used  Substance and Sexual  Activity   Alcohol use: Yes    Alcohol/week: 3.0 standard drinks of alcohol    Types: 3 Cans of beer per week   Drug use: No   Sexual activity: Not Currently  Other Topics Concern   Not on file  Social History Narrative   Not on file   Social Drivers of Health   Financial Resource Strain: Not on file  Food Insecurity: No Food Insecurity (08/15/2022)   Hunger Vital Sign    Worried About Running Out of Food in the Last Year: Never true    Ran Out of Food in the Last Year: Never true  Transportation Needs: No Transportation Needs (08/15/2022)   PRAPARE - Administrator, Civil Service (Medical): No    Lack of Transportation (Non-Medical): No  Physical Activity: Not on file  Stress: Not on file  Social Connections: Not on file    Family History  Adopted: Yes    Past Medical History:  Diagnosis Date   AICD (automatic cardioverter/defibrillator) present    Anemia    Arthritis    mild in my joints & knuckles (05/10/2016)   CHF (congestive heart failure) (HCC)    Coronary artery disease    Hemochromatosis    Hyperlipidemia    Ischemic cardiomyopathy    EF 36%; prior anterior MI, s/p CABG x 3; s/p cath Feb 2013 showing grafts to be patent with severe LV dysfunction   Myocardial infarction (HCC) 2006   PAF (paroxysmal atrial fibrillation) (HCC)    Pneumonia 2000   RBBB (right bundle branch block)    Sleep apnea    can't tolerate mask (05/10/2016)   Type II diabetes mellitus (HCC)    Ventricular tachycardia, 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    Past Surgical History:  Procedure Laterality Date   BI-VENTRICULAR IMPLANTABLE CARDIOVERTER DEFIBRILLATOR N/A 08/21/2011   Procedure: BI-VENTRICULAR IMPLANTABLE CARDIOVERTER DEFIBRILLATOR  (CRT-D);  Surgeon: Elspeth JAYSON Sage, MD;  Location: [facility] CATH LAB;  Service: Cardiovascular;  Laterality: N/A;   BIV ICD GENERTAOR CHANGE OUT N/A 06/12/2014   Procedure: BIV ICD GENERTAOR CHANGE OUT;  Surgeon: Elspeth JAYSON Sage, MD;  Location: [facility] CATH  LAB;  Service: Cardiovascular;  Laterality: N/A;   BIV ICD INSERTION CRT-D N/A 08/15/2022   Procedure: BIV ICD INSERTION CRT-D;  Surgeon: Waddell Danelle ORN, MD;  Location: [facility] INVASIVE CV LAB;  Service: Cardiovascular;  Laterality: N/A;   CARDIAC CATHETERIZATION N/A 01/19/2016   Procedure: Left Heart Cath and Cors/Grafts Angiography;  Surgeon: Peter M Swaziland, MD;  Location: [facility] INVASIVE CV LAB;  Service: Cardiovascular;  Laterality: N/A;   CORONARY ARTERY BYPASS GRAFT  2006   Emergent CABG x 3 with LIMA to LAD, SVG to OM, SVG to distal RCA per Dr. Army   FRACTURE SURGERY     INGUINAL HERNIA REPAIR  02/09/2012   Procedure: HERNIA REPAIR INGUINAL ADULT;  Surgeon: Lynwood MALVA Pina, MD;  Location: [facility] OR;  Service: General;  Laterality: Right;   INGUINAL HERNIA REPAIR Bilateral [phone]   INSERTION OF MESH  02/09/2012   Procedure: INSERTION OF MESH;  Surgeon: Lynwood MALVA Pina, MD;  Location: [facility] OR;  Service: General;  Laterality: Right;   LEFT HEART CATHETERIZATION WITH CORONARY/GRAFT ANGIOGRAM N/A 05/25/2011   Procedure: LEFT HEART CATHETERIZATION WITH EL BILE;  Surgeon: Peter M Swaziland, MD;  Location: [facility] CATH LAB;  Service: Cardiovascular;  Laterality: N/A;   LIVER BIOPSY  1990s   SHOULDER OPEN ROTATOR CUFF REPAIR Bilateral ?2000/~ 2007   left; right    V TACH ABLATION N/A 06/08/2016   Procedure: V Tach Ablation;  Surgeon: Lynwood Rakers, MD;  Location: [facility] INVASIVE CV LAB;  Service: Cardiovascular;  Laterality: N/A;   VENTRICULAR ABLATION SURGERY  06/08/2016   WRIST FRACTURE SURGERY Left 06/2005   plates and screws and cadavaer bone     Current Outpatient Medications on File Prior to Visit  Medication Sig Dispense Refill   ALPRAZolam  (XANAX ) 0.25 MG tablet Take 0.25 mg by mouth at bedtime.     aspirin  81 MG tablet Take 81 mg by mouth daily.     carvedilol  (COREG ) 25 MG tablet Take 1 tablet (25  mg total) by mouth 2 (two) times daily with a meal. 180 tablet 3   fenofibrate  160 MG tablet  Take 1 tablet (160 mg total) by mouth daily. 90 tablet 3   fluticasone  (FLONASE ) 50 MCG/ACT nasal spray Place 1 spray into both nostrils daily as needed for allergies or rhinitis.     furosemide  (LASIX ) 40 MG tablet Take 1 tablet (40 mg total) by mouth daily. 90 tablet 3   lisinopril  (ZESTRIL ) 10 MG tablet Take 10 mg in the morning and 5 mg in the evening 135 tablet 3   magnesium  oxide (MAG-OX) 400 MG tablet Take 1 tablet (400 mg total) by mouth 2 (two) times daily. Please make overdue appt with Dr. Fernande before anymore refills. Thank you 1st attempt (Patient taking differently: Take 1 tablet by mouth daily. Please make overdue appt with Dr. Fernande before anymore refills. Thank you 1st attempt) 60 tablet 0   metFORMIN  (GLUCOPHAGE ) 500 MG tablet Take 1,000 mg by mouth 2 (two) times daily with a meal.     NITROSTAT  0.4 MG SL tablet PLACE 1 TABLET UNDER THE TONGUE EVERY 5 MINUTES AS NEEDED FOR CHEST PAIN FOR UP TO 3 DOSES. 25 tablet 3   ONETOUCH VERIO test strip 1 each by Other route as needed for other.   6   rosuvastatin  (CRESTOR ) 20 MG tablet Take 1 tablet by mouth every Sunday.     sotalol  (BETAPACE ) 160 MG tablet Take 1 tablet (160 mg total) by mouth daily. 90 tablet 3   spironolactone  (ALDACTONE ) 25 MG tablet Take 0.5 tablets (12.5 mg total) by mouth daily. Pt needs to schedule appt with provider for further refills - 1st attempt 45 tablet 3   No current facility-administered medications on file prior to visit.    Allergies  Allergen Reactions   Entresto  [Sacubitril -Valsartan ] Other (See Comments)    Hypotension, increased heart rate, dizziness, blurry vision   Linagliptin-Metformin  Hcl     chest pain, GI upset   Amphotericin B Lipid Complex [Amphotericin B]     MYALGIAS   Vytorin [Ezetimibe -Simvastatin]     MYALGIAS   Evolocumab       Elevated BS   Statins Other (See Comments)    Myalgias     Vitals:   01/03/24 0957  BP: (!) 100/59  Pulse: 76   BMI 27.9   Physical exam:    General: The patient was alert and appears not in acute distress.  Mood and affect are appropriate .  The patient's interactions are: Cooperative, makes eye contact, follows the instructions and answers questions coherently.  The patient is groomed and appropriately groomed and dressed. Head: Normocephalic, atraumatic.  Neck is supple. Mallampati: 3 plus , furrowed tongue  The neck circumference measured 20 inches. Nasal airflow was patent ,    Retrognathia was noted.  Dental status: crowded,   Cardiovascular:  Regular rate and cardiac rhythm by palpable pulse. Systolic murmur.  Respiratory: no audible wheezing, no tachypnoea.   Skin:  Without evidence of ankle edema. No discoloration.  Trunk:  BMI is 28 The patient's posture was erect.   Neurologic exam : The patient was awake and alert, oriented to place and time.   Attention span & concentration ability appeared normal.  Speech was fluent, without dysarthria, dysphonia or aphasia, and of normal volume.     Cranial nerves:  Titubation Pupils are round, equal in size and briskly reactive to light.  Funduscopic exam was deferred.  Extraocular movements in vertical and horizontal planes  were intact and without nystagmus. (No Diplopia reported). Visual fields by finger perimetry are intact. Hearing was intact to soft voice.    Facial sensation intact to fine touch.  Facial motor strength: Symmetric movement and tongue and uvula move midline.  Neck ROM: rotation, tilt and flexion extension were intact for age and shoulder shrug was symmetrical.    Motor exam:  Symmetric bulk, strength and ROM.   Normal tone without cog- wheeling, and symmetric grip strength.   Sensory:  Fine touch and vibration were tested by tuning fork and intact.  Proprioception tested in the upper extremities was normal.   Coordination: The patient reported no problems with button closure and no changes to penmanship.   The Finger-to-nose maneuver was  intact without evidence of ataxia, dysmetria or tremor.   Gait and station: Patient could rise unassisted from a seated position, without bracing,..  Stance was of normal/ wide width. The patient turned with 4 steps.   Deep tendon reflexes: Upper extremities did show symmetric DTRs. Lower extremity DTRs were symmetric and attenuated.   Babinski response was deferred .   I would like to thank Shepard Ade, MD for allowing me to meet with your patient, [name].   [name] main concern was his insomnia, situational- he is very anxious about his wife's health, his wife has reported loud snorting and witnessed apneas.   He is sleep deprived, estimating only 3-5 hours of nocturnal sleep.  He falls asleep in daytime  as soon as he is not stimulated or physically active   Risk factors for OSA were present,  including : Body mass index is 27.94 kg/m.,  20 neck size and  overbite, retrognathia , upper airway anatomy. He lost weight , from 248  to 200  lbs.   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   Upon review of his referral I saw that he has chronic dyspnea for which he is followed by cardiology he does have a systolic murmur and he is known to rely on Lasix  to prevent fluid accumulation.  However a twice daily dosing of Lasix  had led to dehydration and rising creatinine.  His management of chronic conditions is done at the office of Dr. Shepard, he also is on carvedilol  and lisinopril  he takes magnesium  which helps with muscle cramps, and aspirin  daily baby size, sotalol  for his irregular heartbeats and he is on a generic form of Lexapro 5 mg only.  This was meant to help with the situational anxiety but has not affected his sleep yet.  He is followed for hemochromatosis by Dr. Timmy, he is s/p CABG, his creatinine on 8-19 2025 was 3.1 which is a critical elevation his BUN at the time was  68 clearly stating that he was dehydrated.  This would be related to an acute kidney injury.  Daytime somnolence has been a chronic issue but we are looking for organic causes ruling out obstructive sleep apnea snoring, congestive heart failure also places this patient at a higher risk of central sleep apnea and for this reason I will not order a home sleep test but an in-lab sleep study.  It does help to provide a effective sleep aid to our patients and I will offer them Colton. Weaver 0.5 mg of Xanax  2 tablets to have a sleep aid.  Complex dx and work up, level 5.     My Plan is to proceed  with:  PSG on Xanax .   I plan to follow up personally within the next 4-5 months or through our NP within 4-5 months.   A total time of  45 minutes consistent of a part of face to face encounter , exam and interview,  and additional preparation time for chart review was spent .  At today's visit, we discussed treatment options, associated risk and benefits, and engage in counseling as needed including, but not limited to:  Sleep hygiene, Quality Sleep Habits, and Safety concerns for patients with daytime sleepiness who are warned to not operate machinery/ motor vehicles when drowsy.  Additionally, the following were reviewed: Past medical records, past medical and surgical history, family and social background, as well as relevant laboratory results, imaging findings, and medical notes, where applicable.  This note was generated by myself in part by using dictation software, and as a result, it may contain unintentional typos and errors.  Nevertheless, effort was made to accurately convey the pertinent aspects of the patient's visit.   Dedra Gores, MD @servicedate @  [facility] Board certified in Sleep Medicine by The [employer] of Sleep Medicine and Diplomate of the [employer] of [employer] (AASM) . Board certified In Neurology, Diplomat of the ABPN,   Fellow of the [employer] of [employer].

## 2024-01-10 ENCOUNTER — Telehealth: Payer: Self-pay | Admitting: Neurology

## 2024-01-10 ENCOUNTER — Institutional Professional Consult (permissible substitution): Admitting: Neurology

## 2024-01-10 NOTE — Telephone Encounter (Signed)
 [employer] medicare no auth req.  Patient is scheduled at [facility] for 02/09/24 at 8 pm.  Mailed packet and sent mychart

## 2024-02-09 ENCOUNTER — Ambulatory Visit: Admitting: Neurology

## 2024-02-09 DIAGNOSIS — F5104 Psychophysiologic insomnia: Secondary | ICD-10-CM

## 2024-02-09 DIAGNOSIS — R0683 Snoring: Secondary | ICD-10-CM

## 2024-02-09 DIAGNOSIS — G4719 Other hypersomnia: Secondary | ICD-10-CM

## 2024-02-09 DIAGNOSIS — I5082 Biventricular heart failure: Secondary | ICD-10-CM

## 2024-02-09 DIAGNOSIS — G4733 Obstructive sleep apnea (adult) (pediatric): Secondary | ICD-10-CM

## 2024-02-09 DIAGNOSIS — Z72820 Sleep deprivation: Secondary | ICD-10-CM

## 2024-02-09 DIAGNOSIS — I255 Ischemic cardiomyopathy: Secondary | ICD-10-CM

## 2024-02-20 ENCOUNTER — Ambulatory Visit: Payer: Self-pay | Admitting: Neurology

## 2024-02-20 DIAGNOSIS — G4734 Idiopathic sleep related nonobstructive alveolar hypoventilation: Secondary | ICD-10-CM | POA: Insufficient documentation

## 2024-02-20 DIAGNOSIS — G4733 Obstructive sleep apnea (adult) (pediatric): Secondary | ICD-10-CM | POA: Insufficient documentation

## 2024-02-20 MED ORDER — ALPRAZOLAM 0.5 MG PO TABS: 0.5000 mg | ORAL_TABLET | Freq: Every evening | ORAL | 0 refills | Status: AC | PRN

## 2024-02-20 NOTE — Procedures (Signed)
 [facility] POLYSOMNOGRAPHY  INTERPRETATION REPORT   STUDY DATE:  02/09/2024     PATIENT NAME:  [name]         DATE OF BIRTH:  October 02, 1955  PATIENT ID:  [id number]    TYPE OF STUDY:  PSG  SLEEP  PHYSICIAN: DEDRA GORES, MD REFERRED BY: Dr Shepard, MD  SCORING TECHNICIAN: Delon Sprung, RPSGT  HISTORY:  This 68 year-old Male patient of Dr Shepard was seen on 01-03-2024 and has a chief complaint of sleep hypoxia, insomnia, non restorative sleep / defibrillator is present.  Past medical history  : see tech note.  ADDITIONAL INFORMATION:  The Epworth Sleepiness Scale endorsed at at  12 /24 points (scores above or equal to 10 are suggestive of hypersomnolence). FSS endorsed at  29 /63 points. GDS at 2/ 15 points.  Height: 72 in Weight: 206 lb (BMI 27) Neck Size: 20 in  MEDICATIONS: Xanax  sleep aid , Coreg , Fenofibrate , Flonase , Lasix , Zestril , Magnesium  Oxide, Metformin , Abilify, Nitrostat , Crestor , Betapace , Aldactone  TECHNICAL DESCRIPTION: A registered sleep technologist ( RPSGT)  was in attendance for the duration of the recording.  Data collection, scoring, video monitoring, and reporting were performed in compliance with the AASM Manual for the Scoring of Sleep and Associated Events; (Hypopnea is scored based on the criteria listed in Section VIII D. 1b in the AASM Manual V2.6 using a 4% oxygen desaturation rule or Hypopnea is scored based on the criteria listed in Section VIII D. 1a in the AASM Manual V2.6 using 3% oxygen desaturation and /or arousal rule).   SLEEP CONTINUITY AND SLEEP ARCHITECTURE:  Lights-out was at 20:46: and lights-on at  06:00:, with  9.2 hours of recording time . Total sleep time ( TST) was 262.5 minutes with a decreased sleep efficiency at 47.4%. There was only  4.8% REM sleep.  Total supine REM sleep time was 00 minutes (0% of total REM sleep).  Sleep latency was increased at 64.0 minutes.  REM sleep latency was increased  at 333.5 minutes. Of the total sleep time, the percentage of stage N1 sleep was 4.8%, stage N2 sleep was 90%, stage N3 sleep was 0.0%, and REM sleep was 4.8%.  There were 1 Stage R periods observed on this study night, 23 awakenings (i.e. transitions to Stage W from any sleep stage), and 69 total stage transitions. Wake after sleep onset (WASO) time accounted for 227 minutes(!!) .  BODY POSITION:  TST was divided  between the following sleep positions: 0.0% supine;  100.0% lateral;  0% prone. Duration of total sleep and percent of total sleep in their respective position is as follows: supine 00 minutes (0%), non-supine 263 minutes (100%); right 262 minutes (100%), left 00 minutes (0%), and prone 00 minutes (0%).   RESPIRATORY MONITORING:   Based on CMS criteria (using a 4% oxygen desaturation rule for scoring hypopneas), there  was 1 obstructive apnea ; 0 central; 0 mixed), and 121 hypopneas. The   Apnea index was 0.2/h . Hypopnea index was 27.7/h . The AHI ( apnea-hypopnea index)  was 27.9/h  overall (0.0 supine, 28/h  non-supine; 9.6 REM,  28.8 /h NREM).  There were 0 respiratory effort-related arousals (RERAs).    OXIMETRY: Oxyhemoglobin Saturation Nadir during sleep was at  60% from a mean of 86%.  Total sleep time (TST)  in hypoxemia (=<88%) was 239.0 minutes, or 91.0% of total sleep time.  (!!)  LIMB MOVEMENTS: There were 0 periodic limb movements of sleep (0.0/hr),  of which 0 (0.0/hr) were associated with an arousal. AROUSAL: There were 14 arousals/hour.  Of these, 29 were identified as respiratory-related arousals (7 /h), 0 were PLM-related arousals (0 /h), and 31 were non-specific arousals (7 /h)  EEG:  PSG EEG was of normal amplitude and frequency, with symmetric manifestation of sleep stages. EKG: The electrocardiogram documented a paced rhythm - The average heart rate during sleep was 61 bpm.  The heart rate during sleep varied between a minimum of 30 bpm  and  a maximum of  72 bpm. AUDIO  and VIDEO:  one bathroom visit,  moderate snoring.   IMPRESSION: 1) This overall moderate - severe sleep apnea has been accompanied by severe and sustained hypoxia throughout the night. Many central hypopneas were noted,  accumulating in NREM sleep.  2) Periodic limb movements were not noted.  2) Sleep efficiency was extremely poor , in spite of sleep aid being provided. Sleep fragmentation  was noted- The majority of sleep arousals was related to hypopnea and hypoxia, Total sleep time was reduced at 262.5 minutes.  Sleep efficiency was decreased at 47.4%.          RECOMMENDATIONS: Return ASAP for in lab titration to CPAP/ BiPAP or ASV- and with expressed order to use oxygen supplementation as soon as needed.  The goal is to provide the patient with a more restful and restorative sleep. I will write for Xanax  as a sleep aid once more, please remind the patient to take this medication po upon arrival in the sleep lab bedroom.  DEDRA GORES,  MD

## 2024-02-25 ENCOUNTER — Inpatient Hospital Stay (HOSPITAL_COMMUNITY)
Admission: EM | Admit: 2024-02-25 | Discharge: 2024-03-01 | DRG: 291 | Disposition: A | Discharge status: Home / Self Care | Attending: Internal Medicine | Admitting: Internal Medicine

## 2024-02-25 ENCOUNTER — Emergency Department (HOSPITAL_COMMUNITY)

## 2024-02-25 ENCOUNTER — Other Ambulatory Visit: Payer: Self-pay

## 2024-02-25 ENCOUNTER — Inpatient Hospital Stay (HOSPITAL_COMMUNITY)

## 2024-02-25 ENCOUNTER — Encounter (HOSPITAL_COMMUNITY): Payer: Self-pay

## 2024-02-25 ENCOUNTER — Telehealth: Payer: Self-pay | Admitting: Cardiology

## 2024-02-25 DIAGNOSIS — Z95810 Presence of automatic (implantable) cardiac defibrillator: Secondary | ICD-10-CM

## 2024-02-25 DIAGNOSIS — D539 Nutritional anemia, unspecified: Secondary | ICD-10-CM | POA: Diagnosis present

## 2024-02-25 DIAGNOSIS — I2489 Other forms of acute ischemic heart disease: Secondary | ICD-10-CM | POA: Diagnosis present

## 2024-02-25 DIAGNOSIS — F39 Unspecified mood [affective] disorder: Secondary | ICD-10-CM | POA: Diagnosis present

## 2024-02-25 DIAGNOSIS — E1122 Type 2 diabetes mellitus with diabetic chronic kidney disease: Secondary | ICD-10-CM | POA: Diagnosis present

## 2024-02-25 DIAGNOSIS — R0902 Hypoxemia: Secondary | ICD-10-CM | POA: Diagnosis present

## 2024-02-25 DIAGNOSIS — K909 Intestinal malabsorption, unspecified: Secondary | ICD-10-CM | POA: Diagnosis present

## 2024-02-25 DIAGNOSIS — I5043 Acute on chronic combined systolic (congestive) and diastolic (congestive) heart failure: Secondary | ICD-10-CM | POA: Diagnosis present

## 2024-02-25 DIAGNOSIS — G4733 Obstructive sleep apnea (adult) (pediatric): Secondary | ICD-10-CM | POA: Diagnosis present

## 2024-02-25 DIAGNOSIS — I480 Paroxysmal atrial fibrillation: Secondary | ICD-10-CM | POA: Diagnosis present

## 2024-02-25 DIAGNOSIS — I255 Ischemic cardiomyopathy: Secondary | ICD-10-CM | POA: Diagnosis present

## 2024-02-25 DIAGNOSIS — I4510 Unspecified right bundle-branch block: Secondary | ICD-10-CM | POA: Diagnosis present

## 2024-02-25 DIAGNOSIS — I959 Hypotension, unspecified: Secondary | ICD-10-CM | POA: Diagnosis present

## 2024-02-25 DIAGNOSIS — D631 Anemia in chronic kidney disease: Secondary | ICD-10-CM | POA: Diagnosis present

## 2024-02-25 DIAGNOSIS — Z6830 Body mass index (BMI) 30.0-30.9, adult: Secondary | ICD-10-CM

## 2024-02-25 DIAGNOSIS — N179 Acute kidney failure, unspecified: Secondary | ICD-10-CM | POA: Diagnosis present

## 2024-02-25 DIAGNOSIS — E66811 Obesity, class 1: Secondary | ICD-10-CM | POA: Diagnosis present

## 2024-02-25 DIAGNOSIS — E785 Hyperlipidemia, unspecified: Secondary | ICD-10-CM | POA: Diagnosis present

## 2024-02-25 DIAGNOSIS — I472 Ventricular tachycardia, unspecified: Secondary | ICD-10-CM | POA: Diagnosis present

## 2024-02-25 DIAGNOSIS — I2510 Atherosclerotic heart disease of native coronary artery without angina pectoris: Secondary | ICD-10-CM | POA: Diagnosis present

## 2024-02-25 DIAGNOSIS — N184 Chronic kidney disease, stage 4 (severe): Secondary | ICD-10-CM | POA: Diagnosis present

## 2024-02-25 DIAGNOSIS — E538 Deficiency of other specified B group vitamins: Secondary | ICD-10-CM | POA: Diagnosis present

## 2024-02-25 DIAGNOSIS — I5023 Acute on chronic systolic (congestive) heart failure: Secondary | ICD-10-CM | POA: Diagnosis present

## 2024-02-25 DIAGNOSIS — K529 Noninfective gastroenteritis and colitis, unspecified: Secondary | ICD-10-CM | POA: Diagnosis present

## 2024-02-25 DIAGNOSIS — Z79899 Other long term (current) drug therapy: Secondary | ICD-10-CM

## 2024-02-25 DIAGNOSIS — E876 Hypokalemia: Secondary | ICD-10-CM | POA: Diagnosis present

## 2024-02-25 DIAGNOSIS — Z951 Presence of aortocoronary bypass graft: Secondary | ICD-10-CM

## 2024-02-25 DIAGNOSIS — I252 Old myocardial infarction: Secondary | ICD-10-CM

## 2024-02-25 DIAGNOSIS — I5021 Acute systolic (congestive) heart failure: Secondary | ICD-10-CM | POA: Diagnosis not present

## 2024-02-25 DIAGNOSIS — Z7984 Long term (current) use of oral hypoglycemic drugs: Secondary | ICD-10-CM

## 2024-02-25 DIAGNOSIS — Z7982 Long term (current) use of aspirin: Secondary | ICD-10-CM

## 2024-02-25 DIAGNOSIS — I509 Heart failure, unspecified: Principal | ICD-10-CM

## 2024-02-25 DIAGNOSIS — Z87891 Personal history of nicotine dependence: Secondary | ICD-10-CM

## 2024-02-25 DIAGNOSIS — I130 Hypertensive heart and chronic kidney disease with heart failure and stage 1 through stage 4 chronic kidney disease, or unspecified chronic kidney disease: Secondary | ICD-10-CM | POA: Diagnosis present

## 2024-02-25 DIAGNOSIS — Z888 Allergy status to other drugs, medicaments and biological substances status: Secondary | ICD-10-CM

## 2024-02-25 LAB — CBC WITH DIFFERENTIAL/PLATELET
Abs Immature Granulocytes: 0.02 K/uL (ref 0.00–0.07)
Basophils Absolute: 0 K/uL (ref 0.0–0.1)
Basophils Relative: 1 %
Eosinophils Absolute: 0 K/uL (ref 0.0–0.5)
Eosinophils Relative: 1 %
HCT: 34.9 % — ABNORMAL LOW (ref 39.0–52.0)
Hemoglobin: 11.2 g/dL — ABNORMAL LOW (ref 13.0–17.0)
Immature Granulocytes: 0 %
Lymphocytes Relative: 14 %
Lymphs Abs: 0.7 K/uL (ref 0.7–4.0)
MCH: 34.6 pg — ABNORMAL HIGH (ref 26.0–34.0)
MCHC: 32.1 g/dL (ref 30.0–36.0)
MCV: 107.7 fL — ABNORMAL HIGH (ref 80.0–100.0)
Monocytes Absolute: 0.5 K/uL (ref 0.1–1.0)
Monocytes Relative: 10 %
Neutro Abs: 3.5 K/uL (ref 1.7–7.7)
Neutrophils Relative %: 74 %
Platelets: 158 K/uL (ref 150–400)
RBC: 3.24 MIL/uL — ABNORMAL LOW (ref 4.22–5.81)
RDW: 14.2 % (ref 11.5–15.5)
WBC: 4.8 K/uL (ref 4.0–10.5)
nRBC: 0 % (ref 0.0–0.2)

## 2024-02-25 LAB — URINALYSIS, ROUTINE W REFLEX MICROSCOPIC
Bilirubin Urine: NEGATIVE
Glucose, UA: NEGATIVE mg/dL
Hgb urine dipstick: NEGATIVE
Ketones, ur: NEGATIVE mg/dL
Leukocytes,Ua: NEGATIVE
Nitrite: NEGATIVE
Protein, ur: NEGATIVE mg/dL
Specific Gravity, Urine: 1.008 (ref 1.005–1.030)
pH: 5 (ref 5.0–8.0)

## 2024-02-25 LAB — RETICULOCYTES
Immature Retic Fract: 7.9 % (ref 2.3–15.9)
RBC.: 3.11 MIL/uL — ABNORMAL LOW (ref 4.22–5.81)
Retic Count, Absolute: 60.6 K/uL (ref 19.0–186.0)
Retic Ct Pct: 2 % (ref 0.4–3.1)

## 2024-02-25 LAB — COMPREHENSIVE METABOLIC PANEL WITH GFR
ALT: 21 U/L (ref 0–44)
AST: 34 U/L (ref 15–41)
Albumin: 3.3 g/dL — ABNORMAL LOW (ref 3.5–5.0)
Alkaline Phosphatase: 43 U/L (ref 38–126)
Anion gap: 15 (ref 5–15)
BUN: 88 mg/dL — ABNORMAL HIGH (ref 8–23)
CO2: 19 mmol/L — ABNORMAL LOW (ref 22–32)
Calcium: 9.1 mg/dL (ref 8.9–10.3)
Chloride: 101 mmol/L (ref 98–111)
Creatinine, Ser: 4.02 mg/dL — ABNORMAL HIGH (ref 0.61–1.24)
GFR, Estimated: 16 mL/min — ABNORMAL LOW (ref >60)
Glucose, Bld: 93 mg/dL (ref 70–99)
Potassium: 4.7 mmol/L (ref 3.5–5.1)
Sodium: 135 mmol/L (ref 135–145)
Total Bilirubin: 1.1 mg/dL (ref 0.0–1.2)
Total Protein: 6.6 g/dL (ref 6.5–8.1)

## 2024-02-25 LAB — HEMOGLOBIN A1C
Hgb A1c MFr Bld: 5.8 % — ABNORMAL HIGH (ref 4.8–5.6)
Mean Plasma Glucose: 119.76 mg/dL

## 2024-02-25 LAB — BRAIN NATRIURETIC PEPTIDE: B Natriuretic Peptide: 656.7 pg/mL — ABNORMAL HIGH (ref 0.0–100.0)

## 2024-02-25 LAB — TSH: TSH: 2.659 u[IU]/mL (ref 0.350–4.500)

## 2024-02-25 LAB — TYPE AND SCREEN
ABO/RH(D): O POS
Antibody Screen: NEGATIVE

## 2024-02-25 LAB — TROPONIN I (HIGH SENSITIVITY)
Troponin I (High Sensitivity): 24 ng/L — ABNORMAL HIGH (ref <18)
Troponin I (High Sensitivity): 26 ng/L — ABNORMAL HIGH (ref <18)

## 2024-02-25 LAB — IRON AND TIBC
Iron: 130 ug/dL (ref 45–182)
Saturation Ratios: 30 % (ref 17.9–39.5)
TIBC: 437 ug/dL (ref 250–450)
UIBC: 307 ug/dL

## 2024-02-25 LAB — MAGNESIUM: Magnesium: 2.3 mg/dL (ref 1.7–2.4)

## 2024-02-25 LAB — T4, FREE: Free T4: 1.26 ng/dL — ABNORMAL HIGH (ref 0.61–1.12)

## 2024-02-25 LAB — ABO/RH: ABO/RH(D): O POS

## 2024-02-25 LAB — FOLATE: Folate: 7.5 ng/mL (ref >5.9)

## 2024-02-25 LAB — I-STAT CG4 LACTIC ACID, ED
Lactic Acid, Venous: 2 mmol/L (ref 0.5–1.9)
Lactic Acid, Venous: 0.8 mmol/L (ref 0.5–1.9)

## 2024-02-25 LAB — VITAMIN B12: Vitamin B-12: 183 pg/mL (ref 180–914)

## 2024-02-25 LAB — FERRITIN: Ferritin: 221 ng/mL (ref 24–336)

## 2024-02-25 MED ORDER — ALBUMIN HUMAN 25 % IV SOLN
12.5000 g | Freq: Once | INTRAVENOUS | Status: AC
  Administered 2024-02-25: 12.5 g via INTRAVENOUS
  Filled 2024-02-25: qty 50

## 2024-02-25 MED ORDER — MORPHINE SULFATE (PF) 2 MG/ML IV SOLN: 2.0000 mg | INTRAVENOUS | Status: DC | PRN

## 2024-02-25 MED ORDER — FUROSEMIDE 10 MG/ML IJ SOLN
40.0000 mg | Freq: Once | INTRAMUSCULAR | Status: AC
  Administered 2024-02-25: 40 mg via INTRAVENOUS
  Filled 2024-02-25: qty 4

## 2024-02-25 MED ORDER — HEPARIN SODIUM (PORCINE) 5000 UNIT/ML IJ SOLN
5000.0000 [IU] | Freq: Three times a day (TID) | INTRAMUSCULAR | Status: DC
  Administered 2024-02-25: 5000 [IU] via SUBCUTANEOUS
  Filled 2024-02-25: qty 1

## 2024-02-25 MED ORDER — POLYETHYLENE GLYCOL 3350 17 G PO PACK
17.0000 g | PACK | Freq: Every day | ORAL | Status: DC | PRN
Start: 2024-02-25 — End: 2024-03-01

## 2024-02-25 MED ORDER — ASPIRIN 81 MG PO CHEW
81.0000 mg | CHEWABLE_TABLET | Freq: Every day | ORAL | Status: DC
  Filled 2024-02-25: qty 1

## 2024-02-25 MED ORDER — NITROGLYCERIN 0.4 MG SL SUBL: 0.4000 mg | SUBLINGUAL_TABLET | SUBLINGUAL | Status: DC | PRN

## 2024-02-25 MED ORDER — BISACODYL 5 MG PO TBEC: 5.0000 mg | DELAYED_RELEASE_TABLET | Freq: Every day | ORAL | Status: DC | PRN

## 2024-02-25 MED ORDER — SOTALOL HCL 80 MG PO TABS
160.0000 mg | ORAL_TABLET | Freq: Every day | ORAL | Status: DC
  Filled 2024-02-25: qty 2

## 2024-02-25 MED ORDER — SODIUM CHLORIDE 0.9% FLUSH
3.0000 mL | Freq: Two times a day (BID) | INTRAVENOUS | Status: DC
  Administered 2024-02-25: 3 mL via INTRAVENOUS

## 2024-02-25 MED ORDER — ROSUVASTATIN CALCIUM 20 MG PO TABS: 20.0000 mg | ORAL_TABLET | ORAL | Status: DC

## 2024-02-25 MED ORDER — FLUTICASONE PROPIONATE 50 MCG/ACT NA SUSP: 1.0000 | Freq: Every day | NASAL | Status: DC | PRN

## 2024-02-25 MED ORDER — PANTOPRAZOLE SODIUM 40 MG IV SOLR
40.0000 mg | Freq: Two times a day (BID) | INTRAVENOUS | Status: DC
  Administered 2024-02-25 – 2024-02-26 (×2): 40 mg via INTRAVENOUS
  Filled 2024-02-25 (×2): qty 10

## 2024-02-25 MED ORDER — ASPIRIN 81 MG PO CHEW
81.0000 mg | CHEWABLE_TABLET | Freq: Every day | ORAL | Status: DC
  Administered 2024-02-25 – 2024-02-26 (×2): 81 mg via ORAL
  Filled 2024-02-25 (×2): qty 1

## 2024-02-25 MED ORDER — ALPRAZOLAM 0.5 MG PO TABS: 0.5000 mg | ORAL_TABLET | Freq: Every evening | ORAL | Status: DC | PRN

## 2024-02-25 MED ORDER — FENOFIBRATE 160 MG PO TABS
160.0000 mg | ORAL_TABLET | Freq: Every day | ORAL | Status: DC
  Administered 2024-02-25: 160 mg via ORAL
  Filled 2024-02-25 (×2): qty 1

## 2024-02-25 NOTE — ED Notes (Signed)
 Patient transported to CT

## 2024-02-25 NOTE — ED Provider Notes (Signed)
 [place] EMERGENCY DEPARTMENT AT [facility] Provider Note   CSN: [id number] Arrival date & time: 02/25/24  1247     Patient presents with: Shortness of Breath   [name] is a 68 y.o. male.   Patient is a 68 year old male with past medical history of CHF with ICD in place, diabetes, CAD, A-fib presenting to the emergency department with lower extremity swelling and shortness of breath.  The patient states that he has had increased swelling and shortness of breath over at least the last week.  He states that he has gained about 15 pounds.  He states that he has been taking his medications as prescribed and has still been urinating a lot every day with his Lasix .  He states that he feels dyspnea both on exertion and at rest.  He denies any associated chest pain, fever or cough.  The history is provided by the patient and the spouse.  Shortness of Breath      Prior to Admission medications   Medication Sig Start Date End Date Taking? Authorizing Provider  ALPRAZolam  (XANAX ) 0.25 MG tablet Take 0.25 mg by mouth at bedtime. 07/14/22   [provider]  ALPRAZolam  (XANAX ) 0.5 MG tablet Take 1 tablet (0.5 mg total) by mouth at bedtime as needed. 02/20/24   Dohmeier, Dedra, MD  aspirin  81 MG tablet Take 81 mg by mouth daily.    [provider]  carvedilol  (COREG ) 25 MG tablet Take 1 tablet (25 mg total) by mouth 2 (two) times daily with a meal. 12/06/23   Jordan, Peter M, MD  fenofibrate  160 MG tablet Take 1 tablet (160 mg total) by mouth daily. 12/06/23   Jordan, Peter M, MD  fluticasone  (FLONASE ) 50 MCG/ACT nasal spray Place 1 spray into both nostrils daily as needed for allergies or rhinitis.    [provider]  furosemide  (LASIX ) 40 MG tablet Take 1 tablet (40 mg total) by mouth daily. 12/06/23 11/30/24  Jordan, Peter M, MD  lisinopril  (ZESTRIL ) 10 MG tablet Take 10 mg in the morning and 5 mg in the evening 12/06/23   Jordan, Peter M, MD  magnesium   oxide (MAG-OX) 400 MG tablet Take 1 tablet (400 mg total) by mouth 2 (two) times daily. Please make overdue appt with Dr. Fernande before anymore refills. Thank you 1st attempt Patient taking differently: Take 1 tablet by mouth daily. Please make overdue appt with Dr. Fernande before anymore refills. Thank you 1st attempt 04/19/20   Fernande Elspeth BROCKS, MD  metFORMIN  (GLUCOPHAGE ) 500 MG tablet Take 1,000 mg by mouth 2 (two) times daily with a meal.    [provider]  NITROSTAT  0.4 MG SL tablet PLACE 1 TABLET UNDER THE TONGUE EVERY 5 MINUTES AS NEEDED FOR CHEST PAIN FOR UP TO 3 DOSES. 04/28/15   Army Katheryn BROCKS, NP  [facility] VERIO test strip 1 each by Other route as needed for other.  09/29/15   [provider]  rosuvastatin  (CRESTOR ) 20 MG tablet Take 1 tablet by mouth every Sunday.    [provider]  sotalol  (BETAPACE ) 160 MG tablet Take 1 tablet (160 mg total) by mouth daily. 12/06/23   Jordan, Peter M, MD  spironolactone  (ALDACTONE ) 25 MG tablet Take 0.5 tablets (12.5 mg total) by mouth daily. Pt needs to schedule appt with provider for further refills - 1st attempt 12/06/23   Jordan, Peter M, MD    Allergies: Entresto  [sacubitril -valsartan ], Linagliptin-metformin  hcl, Amphotericin b lipid complex [amphotericin b], Vytorin [  ezetimibe -simvastatin], Evolocumab , and Statins    Review of Systems  Respiratory:  Positive for shortness of breath.     Updated Vital Signs BP (!) 99/55   Pulse 60   Temp (!) 97.4 F (36.3 C) (Oral)   Resp 15   Ht 6' (1.829 m)   Wt 100.7 kg   SpO2 92%   BMI 30.11 kg/m   Physical Exam Vitals and nursing note reviewed.  Constitutional:      General: He is not in acute distress.    Appearance: He is well-developed.  HENT:     Head: Normocephalic.     Mouth/Throat:     Mouth: Mucous membranes are moist.  Eyes:     Extraocular Movements: Extraocular movements intact.  Neck:     Vascular: JVD present.  Cardiovascular:     Rate and Rhythm:  Normal rate and regular rhythm.  Pulmonary:     Effort: Pulmonary effort is normal.     Breath sounds: Examination of the right-lower field reveals rales. Examination of the left-lower field reveals rales. Rales present.  Abdominal:     Palpations: Abdomen is soft.  Musculoskeletal:        General: Normal range of motion.     Cervical back: Normal range of motion and neck supple.     Right lower leg: Edema (2+ to knee) present.     Left lower leg: Edema (2+ to knee) present.  Skin:    General: Skin is warm and dry.  Neurological:     General: No focal deficit present.     Mental Status: He is alert and oriented to person, place, and time.  Psychiatric:        Mood and Affect: Mood normal.        Behavior: Behavior normal.     (all labs ordered are listed, but only abnormal results are displayed) Labs Reviewed  BRAIN NATRIURETIC PEPTIDE - Abnormal; Notable for the following components:      Result Value   B Natriuretic Peptide 656.7 (*)    All other components within normal limits  COMPREHENSIVE METABOLIC PANEL WITH GFR - Abnormal; Notable for the following components:   CO2 19 (*)    BUN 88 (*)    Creatinine, Ser 4.02 (*)    Albumin  3.3 (*)    GFR, Estimated 16 (*)    All other components within normal limits  CBC WITH DIFFERENTIAL/PLATELET - Abnormal; Notable for the following components:   RBC 3.24 (*)    Hemoglobin 11.2 (*)    HCT 34.9 (*)    MCV 107.7 (*)    MCH 34.6 (*)    All other components within normal limits  TROPONIN I (HIGH SENSITIVITY) - Abnormal; Notable for the following components:   Troponin I (High Sensitivity) 24 (*)    All other components within normal limits  TROPONIN I (HIGH SENSITIVITY) - Abnormal; Notable for the following components:   Troponin I (High Sensitivity) 26 (*)    All other components within normal limits  MAGNESIUM     EKG: EKG Interpretation Date/Time:  Monday February 25 2024 14:06:05 EST Ventricular Rate:  72 PR  Interval:  170 QRS Duration:  158 QT Interval:  450 QTC Calculation: 492 R Axis:   -79  Text Interpretation: AV dual-paced rhythm Biventricular pacemaker detected Abnormal ECG When compared with ECG of 06-Dec-2023 09:20, No significant change since last tracing Confirmed by Patsey Lot [phone]) on 02/25/2024 2:26:19 PM  Radiology: [facility] Chest 1 View Result  Date: 02/25/2024 CLINICAL DATA:  Shortness of breath and bilateral lower extremity swelling. EXAM: CHEST  1 VIEW COMPARISON:  Aug 16, 2022 FINDINGS: There is stable multi lead AICD positioning. Multiple sternal wires are present. The cardiac silhouette is mildly enlarged and unchanged in size. Low lung volumes are noted with mild atelectatic changes suspected within the bilateral lower extremities. No pleural effusion or pneumothorax is identified. The visualized skeletal structures are unremarkable. IMPRESSION: 1. Evidence of prior median sternotomy/CABG. 2. Stable cardiomegaly with low lung volumes and mild bibasilar atelectasis. Electronically Signed   By: Suzen Dials M.D.   On: 02/25/2024 15:41     Procedures   Medications Ordered in the ED  furosemide  (LASIX ) injection 40 mg (40 mg Intravenous Given 02/25/24 1701)    Clinical Course as of 02/25/24 1757  Mon Feb 25, 2024  1755 Repeat troponin flat patient will be admitted for AKI and diuresis.  [VK]    Clinical Course User Index [VK] Kingsley, Levie Wages K, DO                                 Medical Decision Making This patient presents to the ED with chief complaint(s) of LE, SOB with pertinent past medical history of CHF with ICD in place, CAD, hypertension, diabetes, hemochromatosis which further complicates the presenting complaint. The complaint involves an extensive differential diagnosis and also carries with it a high risk of complications and morbidity.    The differential diagnosis includes ACS, arrhythmia, anemia, pneumonia, pneumothorax, pulmonary edema,  pleural effusion, decompensated CHF  Additional history obtained: Additional history obtained from family Records reviewed outpatient cardiology records  ED Course and Reassessment: On patient's arrival he was initially mildly hypotensive with blood pressures in the 90s though improved to the 130s on my evaluation in the room, in no acute distress.  He had EKG, labs and chest x-ray initiated in triage.  EKG shows AV dual paced rhythm without significant change from prior EKG.  Chest x-ray does show mild pulmonary edema.  Labs have an elevated BNP consistent with CHF exacerbation with a mildly elevated troponin.  He does also have a significant AKI with a creatinine of 4 from baseline of around 2.  Patient will need to be admitted for diuresis and close renal function monitoring.  Independent labs interpretation:  The following labs were independently interpreted: mildly elevated troponin, flat on repeat, elevated BNP, AKI on CKD  Independent visualization of imaging: - I independently visualized the following imaging with scope of interpretation limited to determining acute life threatening conditions related to emergency care: CXR, which revealed pulmonary edema  Consultation: - Consulted or discussed management/test interpretation w/ external professional: hospitalist  Consideration for admission or further workup: patient requires admission for CHF exacerbation and AKI Social Determinants of health: N/A    Risk Prescription drug management. Decision regarding hospitalization.       Final diagnoses:  Acute on chronic congestive heart failure, unspecified heart failure type (HCC)  AKI (acute kidney injury)    ED Discharge Orders     None          Kingsley, Daley Mooradian K, DO 02/25/24 1757

## 2024-02-25 NOTE — Telephone Encounter (Signed)
 I spoke with the patient and he is stating that he is experiencing SOB and lower leg edema since last Wednesday.  He does check his weight daily and his weight has gone from 210-221.6 lbs since last week. Today he is weighing at 222.1 lbs, has SOB and the lower leg edema. He is taking 40 mg of lasix  daily as well. I advised for him to head to the ED. I told him that we as the cardiologist office can not treat him immediately but the ED can. I advised for him to go and I will reach out to his provider on medication management with his lasix .

## 2024-02-25 NOTE — Consult Note (Addendum)
 Cardiology Consultation   Patient ID: [name] MRN: [id number]; DOB: 28-Apr-1955  Admit date: 02/25/2024 Date of Consult: 02/25/2024  PCP:  Shepard Ade, MD   [facility] Providers Cardiologist:  Colton Jordan, MD  Electrophysiologist:  Danelle Birmingham, MD       Patient Profile: [name] is a 68 y.o. male with a hx of CAD s/p CABG in 2006, HFrEF with CRT for primary prevention device change in 2024, VT with prior shocks and currently on sotalol , hyperlipidemia, hemochromatosis, RBBB, , type 2 DM and and OSA who is being seen 02/25/2024 for the evaluation of HF exacerbation at the request of [name].  History of Present Illness: [name] has known ICM [CABG in 2006 with LHC in 2017 showing patent grafts and has CRT-D in place for primary prevention since April of 2013. EF by Echo was 45-50% at that time. Cardiac MRI showed an EF of 31%. Most recent echo showed 04/2021 showed LVEF 20-25% with RWMA. G2 DD. Moderately elevated PASP 46.6 mmHg. Moderately dilated RA. GDMT has been limited by hypotension, did not tolerate entresto . Additionally, patient has had recurrent VT with syncope and underwent ablation with Dr. Kelsie 06/2016. He did have recurrence, re-Weaver ablation was considered though deferred given complexity, and he was started on sotalol . Other issues include hyperlipidemia with statin and zetia  intolerance, failed repatha   2/2 elevated blood sugar and A1c >9.  Presented to the ED today for increased peripheral edema and shortness of breath with associated 15lb weight gain x 1 week. Patient had doubled his lasix  dose for 3 days without relief.  He had a high salt diet leading up to this exacerbation.  He had a dose of Lasix  40 mg p.o. this morning without improvement.  Given the persistence and worsening of his symptoms he came to the ED.  In the ED:  BP: 93/46   HR 65 ECG: AV paced with RBBB and TWI in I and avL. VR 72 [unchanged from  previous] CXR showed stable cardiomegaly without evidence of volume overload CT abd/pelvis pending Echo pending  Pertinent lab work: Macrocytic anemia Cr.4.02 [Baseline unclear given last documented was 08/2022] LFTs WNL BNP 656 Troponin 24 -> 26  Patient has been given IV lasix  40 mg. UOP chart   Past Medical History:  Diagnosis Date   AICD (automatic cardioverter/defibrillator) present    Anemia    Arthritis    mild in my joints & knuckles (05/10/2016)   CHF (congestive heart failure) (HCC)    Coronary artery disease    Hemochromatosis    Hyperlipidemia    Ischemic cardiomyopathy    EF 36%; prior anterior MI, s/p CABG x 3; s/p cath Feb 2013 showing grafts to be patent with severe LV dysfunction   Myocardial infarction (HCC) 2006   PAF (paroxysmal atrial fibrillation) (HCC)    Pneumonia 2000   RBBB (right bundle branch block)    Sleep apnea    can't tolerate mask (05/10/2016)   Type II diabetes mellitus (HCC)    Ventricular tachycardia, 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    Past Surgical History:  Procedure Laterality Date   BI-VENTRICULAR IMPLANTABLE CARDIOVERTER DEFIBRILLATOR N/A 08/21/2011   Procedure: BI-VENTRICULAR IMPLANTABLE CARDIOVERTER DEFIBRILLATOR  (CRT-D);  Surgeon: Colton JAYSON Sage, MD;  Location: [facility] CATH LAB;  Service: Cardiovascular;  Laterality: N/A;   BIV ICD GENERTAOR CHANGE OUT N/A 06/12/2014   Procedure: BIV ICD GENERTAOR CHANGE OUT;  Surgeon: Colton JAYSON Sage, MD;  Location: [facility] CATH LAB;  Service: Cardiovascular;  Laterality: N/A;   BIV ICD INSERTION CRT-D N/A 08/15/2022   Procedure: BIV ICD INSERTION CRT-D;  Surgeon: Colton Danelle ORN, MD;  Location: [facility] INVASIVE CV LAB;  Service: Cardiovascular;  Laterality: N/A;   CARDIAC CATHETERIZATION N/A 01/19/2016   Procedure: Left Heart Cath and Cors/Grafts Angiography;  Surgeon: Colton M Jordan, MD;  Location: [facility] INVASIVE CV LAB;  Service: Cardiovascular;  Laterality: N/A;   CORONARY ARTERY BYPASS GRAFT  2006    Emergent CABG x 3 with LIMA to LAD, SVG to OM, SVG to distal RCA per Dr. Army   FRACTURE SURGERY     INGUINAL HERNIA REPAIR  02/09/2012   Procedure: HERNIA REPAIR INGUINAL ADULT;  Surgeon: Colton MALVA Pina, MD;  Location: [facility] OR;  Service: General;  Laterality: Right;   INGUINAL HERNIA REPAIR Bilateral [phone]   INSERTION OF MESH  02/09/2012   Procedure: INSERTION OF MESH;  Surgeon: Colton MALVA Pina, MD;  Location: [facility] OR;  Service: General;  Laterality: Right;   LEFT HEART CATHETERIZATION WITH CORONARY/GRAFT ANGIOGRAM N/A 05/25/2011   Procedure: LEFT HEART CATHETERIZATION WITH EL BILE;  Surgeon: Colton M Jordan, MD;  Location: [facility] CATH LAB;  Service: Cardiovascular;  Laterality: N/A;   LIVER BIOPSY  1990s   SHOULDER OPEN ROTATOR CUFF REPAIR Bilateral ?2000/~ 2007   left; right    V TACH ABLATION N/A 06/08/2016   Procedure: V Tach Ablation;  Surgeon: Colton Rakers, MD;  Location: [facility] INVASIVE CV LAB;  Service: Cardiovascular;  Laterality: N/A;   VENTRICULAR ABLATION SURGERY  06/08/2016   WRIST FRACTURE SURGERY Left 06/2005   plates and screws and cadavaer bone     Home Medications:  Prior to Admission medications   Medication Sig Start Date End Date Taking? Authorizing Provider  ALPRAZolam  (XANAX ) 0.25 MG tablet Take 0.25 mg by mouth at bedtime. 07/14/22   [provider]  ALPRAZolam  (XANAX ) 0.5 MG tablet Take 1 tablet (0.5 mg total) by mouth at bedtime as needed. 02/20/24   Dohmeier, Dedra, MD  aspirin  81 MG tablet Take 81 mg by mouth daily.    [provider]  carvedilol  (COREG ) 25 MG tablet Take 1 tablet (25 mg total) by mouth 2 (two) times daily with a meal. 12/06/23   Weaver, Colton M, MD  fenofibrate  160 MG tablet Take 1 tablet (160 mg total) by mouth daily. 12/06/23   Weaver, Colton M, MD  fluticasone  (FLONASE ) 50 MCG/ACT nasal spray Place 1 spray into both nostrils daily as needed for allergies or rhinitis.    [provider]  furosemide  (LASIX ) 40  MG tablet Take 1 tablet (40 mg total) by mouth daily. 12/06/23 11/30/24  Weaver, Colton M, MD  lisinopril  (ZESTRIL ) 10 MG tablet Take 10 mg in the morning and 5 mg in the evening 12/06/23   Weaver, Colton M, MD  magnesium  oxide (MAG-OX) 400 MG tablet Take 1 tablet (400 mg total) by mouth 2 (two) times daily. Please make overdue appt with Dr. Fernande before anymore refills. Thank you 1st attempt Patient taking differently: Take 1 tablet by mouth daily. Please make overdue appt with Dr. Fernande before anymore refills. Thank you 1st attempt 04/19/20   Fernande Colton BROCKS, MD  metFORMIN  (GLUCOPHAGE ) 500 MG tablet Take 1,000 mg by mouth 2 (two) times daily with a meal.    [provider]  NITROSTAT  0.4 MG SL tablet PLACE 1 TABLET UNDER THE TONGUE EVERY 5 MINUTES AS NEEDED FOR CHEST PAIN FOR UP TO 3 DOSES. 04/28/15  Army Katheryn BROCKS, NP  [facility] VERIO test strip 1 each by Other route as needed for other.  09/29/15   [provider]  rosuvastatin  (CRESTOR ) 20 MG tablet Take 1 tablet by mouth every Sunday.    [provider]  sotalol  (BETAPACE ) 160 MG tablet Take 1 tablet (160 mg total) by mouth daily. 12/06/23   Weaver, Colton M, MD  spironolactone  (ALDACTONE ) 25 MG tablet Take 0.5 tablets (12.5 mg total) by mouth daily. Pt needs to schedule appt with provider for further refills - 1st attempt 12/06/23   Weaver, Colton M, MD    Scheduled Meds:  aspirin   81 mg Oral Daily   fenofibrate   160 mg Oral Daily   heparin   5,000 Units Subcutaneous Q8H   pantoprazole (PROTONIX) IV  40 mg Intravenous Q12H   [START ON 03/02/2024] rosuvastatin   20 mg Oral Q Sun   sodium chloride  flush  3 mL Intravenous Q12H   Continuous Infusions:  PRN Meds: ALPRAZolam , bisacodyl, fluticasone , morphine injection, nitroGLYCERIN , polyethylene glycol  Allergies:    Allergies  Allergen Reactions   Entresto  [Sacubitril -Valsartan ] Other (See Comments)    Hypotension, increased heart rate, dizziness, blurry vision    Linagliptin-Metformin  Hcl     chest pain, GI upset   Amphotericin B Lipid Complex [Amphotericin B]     MYALGIAS   Vytorin [Ezetimibe -Simvastatin]     MYALGIAS   Evolocumab       Elevated BS   Statins Other (See Comments)    Myalgias     Social History:   Social History   Socioeconomic History   Marital status: Married    Spouse name: Not on file   Number of children: 1   Years of education: Not on file   Highest education level: Not on file  Occupational History   Occupation: telephone service  Tobacco Use   Smoking status: Former    Current packs/day: 0.00    Average packs/day: 2.0 packs/day for 30.0 years (60.0 ttl pk-yrs)    Types: Cigarettes    Start date: 03/12/1975    Quit date: 03/11/2005    Years since quitting: 18.9   Smokeless tobacco: Never  Vaping Use   Vaping status: Never Used  Substance and Sexual Activity   Alcohol use: Yes    Alcohol/week: 3.0 standard drinks of alcohol    Types: 3 Cans of beer per week   Drug use: No   Sexual activity: Not Currently  Other Topics Concern   Not on file  Social History Narrative   Not on file   Social Drivers of Health   Financial Resource Strain: Not on file  Food Insecurity: No Food Insecurity (08/15/2022)   Hunger Vital Sign    Worried About Running Out of Food in the Last Year: Never true    Ran Out of Food in the Last Year: Never true  Transportation Needs: No Transportation Needs (08/15/2022)   PRAPARE - Administrator, Civil Service (Medical): No    Lack of Transportation (Non-Medical): No  Physical Activity: Not on file  Stress: Not on file  Social Connections: Not on file  Intimate Partner Violence: Not At Risk (08/15/2022)   Humiliation, Afraid, Rape, and Kick questionnaire    Fear of Current or Ex-Partner: No    Emotionally Abused: No    Physically Abused: No    Sexually Abused: No    Family History:   Family History  Adopted: Yes     ROS:  Please see the  history of present illness.   All other ROS reviewed and negative.     Physical Exam/Data: Vitals:   02/25/24 1700 02/25/24 1715 02/25/24 1730 02/25/24 1845  BP: (!) 97/55 (!) 108/56 (!) 99/55 (!) 101/51  Pulse: (!) 59 62 60 (!) 59  Resp: 14 15 15 16   Temp:      TempSrc:      SpO2: 93% 96% 92% 97%  Weight:      Height:        Intake/Output Summary (Last 24 hours) at 02/25/2024 2011 Last data filed at 02/25/2024 1809 Gross per 24 hour  Intake --  Output 400 ml  Net -400 ml      02/25/2024    1:33 PM 01/03/2024    9:57 AM 12/06/2023    9:16 AM  Last 3 Weights  Weight (lbs) 222 lb 206 lb 207 lb 9.6 oz  Weight (kg) 100.699 kg 93.441 kg 94.167 kg     Body mass index is 30.11 kg/m.   Physical Exam Vitals and nursing note reviewed.  Constitutional:      Appearance: Normal appearance.  HENT:     Head: Normocephalic and atraumatic.  Eyes:     Conjunctiva/sclera: Conjunctivae normal.  Neck:     Comments: JVD up to angle of mandible with HJR Cardiovascular:     Rate and Rhythm: Normal rate and regular rhythm.     Heart sounds: No murmur heard. Pulmonary:     Effort: Pulmonary effort is normal.     Breath sounds: Rales present.  Abdominal:     Comments: Mild abdominal distention  Musculoskeletal:     Comments: 3+ bilateral lower extremity pitting edema  Skin:    Coloration: Skin is not jaundiced or pale.  Neurological:     Mental Status: He is alert.  Psychiatric:        Mood and Affect: Mood normal.      EKG:  The EKG was personally reviewed and demonstrates AV paced rhythm:  See HPI Telemetry:  Telemetry was personally reviewed and demonstrates: Paced rhythm  Relevant CV Studies:  Echocardiogram 04/2021 IMPRESSIONS     1. Left ventricular ejection fraction, by estimation, is 25 to 30%. Left  ventricular ejection fraction by 2D MOD biplane is 28.9 %. Left  ventricular ejection fraction by PLAX is 27 %. The left ventricle has  severely decreased function. The left  ventricle  demonstrates regional wall motion abnormalities (see scoring  diagram/findings for description). The left ventricular internal cavity  size was moderately dilated. Left ventricular diastolic parameters are  consistent with Grade II diastolic  dysfunction (pseudonormalization).   2. Right ventricular systolic function is normal. The right ventricular  size is normal. There is moderately elevated pulmonary artery systolic  pressure.   3. Right atrial size was moderately dilated.   4. The mitral valve is normal in structure. Trivial mitral valve  regurgitation. No evidence of mitral stenosis.   5. The aortic valve is tricuspid. Aortic valve regurgitation is not  visualized. No aortic stenosis is present.   6. The inferior vena cava is normal in size with greater than 50%  respiratory variability, suggesting right atrial pressure of 3 mmHg.    LHC 01/2016  Prox LAD to Mid LAD lesion, 85 %stenosed. Ost Ramus to Ramus lesion, 55 %stenosed. Prox Cx to Mid Cx lesion, 100 %stenosed. Ost RCA to Dist RCA lesion, 100 %stenosed. SVG graft was visualized by angiography and is small. SVG graft was visualized by angiography and  is small. LIMA graft was visualized by angiography and is large and anatomically normal. There is moderate left ventricular systolic dysfunction. LV end diastolic pressure is moderately elevated. The left ventricular ejection fraction is 35-45% by visual estimate.   1. Severe 3 vessel obstructive CAD 2. Large patent LIMA to the LAD 3. Patent SVG to OM2. This is a tiny graft supplying a very small OM  4. Patent SVG to PDA. This is also a tiny graft supplying a very tiny PDA 5. Moderate LV dysfunction. 6. Elevated LV EDP   Plan: medical therapy. Antiarrhythmic drug therapy per EP team.   Laboratory Data: High Sensitivity Troponin:   Recent Labs  Lab 02/25/24 1417 02/25/24 1623  TROPONINIHS 24* 26*     Chemistry Recent Labs  Lab 02/25/24 1417  NA 135  K 4.7   CL 101  CO2 19*  GLUCOSE 93  BUN 88*  CREATININE 4.02*  CALCIUM  9.1  MG 2.3  GFRNONAA 16*  ANIONGAP 15    Recent Labs  Lab 02/25/24 1417  PROT 6.6  ALBUMIN  3.3*  AST 34  ALT 21  ALKPHOS 43  BILITOT 1.1   Hematology Recent Labs  Lab 02/25/24 1417  WBC 4.8  RBC 3.24*  HGB 11.2*  HCT 34.9*  MCV 107.7*  MCH 34.6*  MCHC 32.1  RDW 14.2  PLT 158   BNP Recent Labs  Lab 02/25/24 1417  BNP 656.7*    Radiology/Studies:  DG Chest 1 View Result Date: 02/25/2024 CLINICAL DATA:  Shortness of breath and bilateral lower extremity swelling. EXAM: CHEST  1 VIEW COMPARISON:  Aug 16, 2022 FINDINGS: There is stable multi lead AICD positioning. Multiple sternal wires are present. The cardiac silhouette is mildly enlarged and unchanged in size. Low lung volumes are noted with mild atelectatic changes suspected within the bilateral lower extremities. No pleural effusion or pneumothorax is identified. The visualized skeletal structures are unremarkable. IMPRESSION: 1. Evidence of prior median sternotomy/CABG. 2. Stable cardiomegaly with low lung volumes and mild bibasilar atelectasis. Electronically Signed   By: Suzen Dials M.D.   On: 02/25/2024 15:41     Assessment and Plan:  Acute on Chronic combined systolic and diastolic heart failure with CRT-D, not in cardiogenic shock Ischemic cardiomyopathy  PTA lisinopril  10 mg in the am and 5 mg in pm. Coreg  25 BID, spironolactone  12.5 mg, lasix  40. SGLT2i deferred 2/2 cost.  Warm extremities and perfusing well - likely not in cardiogenic shock - Hold lisinopril , sotalol  and spironolactone  for now 2/2 hypotension and renal dysfunction - Hold carvedilol  for now due to systolic heart failure exacerbation but can likely start metoprolol succinate tomorrow for GDMT and less blood pressure reduction and to help prevent VT - Start IV lasix  40 mg BID.  If blood pressure does not tolerate this then would start on a Lasix  drip - Monitor daily  weight and intake/output   Demand Ischemia secondary to heart failure  AKI, likely due to HF - Continue with diuresis   Hx of VT s/p ablation in 2017 CRT in place as above.  Last remote check 12/2023 showed AV paced with stable capture and sensing. 1 VT episode converted with one burst of ATP. - hold sotalol  160 mg 2/2 renal dysfunction - Keep K> 4 and Mag > 2  CAD s/p CABG in 2006 Continue ASA 81 mg  Hyperlipidemia Statin intolerance and Zetia  Intolerance Failed repatha  2/2 glucose   Per primary T2DM Hemochromatosis OSA not on CPAP  Risk Assessment/Risk Scores:  [place]  Heart Association (NYHA) Functional Class NYHA Class III     For questions or updates, please contact [facility] Please consult [email or web address] for contact info under   Signed, Emeline FORBES Calender, Weaver  02/25/2024 8:11 PM  Time coordinating care: 65 minutes

## 2024-02-25 NOTE — TOC CM/SW Note (Signed)
 TOC consult received for CHF [facility] program vs. SNF placement. Awaiting therapy recs for further determination of needs.   CM/SW will continue to follow for d/c planning.   Merilee Batty, MSN, RN Case Management [phone]

## 2024-02-25 NOTE — H&P (Signed)
 History and Physical    Patient: [name] FMW:[id number] DOB: 09-Apr-1956 DOA: 02/25/2024 DOS: the patient was seen and examined on 02/25/2024 . PCP: Shepard Ade, MD  Patient coming from: Home Chief complaint: Chief Complaint  Patient presents with   Shortness of Breath   HPI:  [name] is a 68 y.o. male with past medical history  of  ischemic CM and has CRT-D in place for primary prevention since April of 2013. EF by Echo was 45-50% at that time. Cardiac MRI showed an EF of 31%. Other issues include HLD hemochromatosis followed by Dr. Timmy, RBBB, PAF, type 2 DM and OSA.  CAD s/p CABG in 2006 following an MI , Anemia, Arthritis, Hemochromatosis, Hyperlipidemia, PAF, Pneumonia (2000), RBBB ,Sleep apnea, Type II diabetes mellitus,  coming today for shortness of breath.  Patient reports shortness of breath lower extremity edema since Wednesday, increase in the weight from 210 to 221.6 pounds since last week.  And patient advised to come to the emergency room by cardiology office. No chest pain palpitation nausea vomiting fevers chills bleeding falls dizziness blurred vision headaches or any other complaints. Pt reports he does not add salt to food only what is in it. Also d/w him options about oral appliance and to see if that is something that may help him.    ED Course:  Vital signs in the ED were notable for the following:  Low blood pressure since arrival O2 sats 92% on room air. Vitals:   02/25/24 1700 02/25/24 1715 02/25/24 1730 02/25/24 1845  BP: (!) 97/55 (!) 108/56 (!) 99/55 (!) 101/51  Pulse: (!) 59 62 60 (!) 59  Temp:      Resp: 14 15 15 16   Height:      Weight:      SpO2: 93% 96% 92% 97%  TempSrc:      BMI (Calculated):       Vitals:   02/25/24 1253 02/25/24 1527 02/25/24 1545 02/25/24 1600  BP: (!) 93/46 (!) 97/56 (!) 94/54 (!) 103/54   02/25/24 1615 02/25/24 1630 02/25/24 1645 02/25/24 1700  BP: (!) 99/53 (!) 103/54 (!) 96/53 (!) 97/55    02/25/24 1715 02/25/24 1730 02/25/24 1845  BP: (!) 108/56 (!) 99/55 (!) 101/51    >>ED evaluation thus far shows: -CMP bicarb of 19, BUN of 88 and AKI with a creatinine of 4.02 and a GFR of 16.  - BNP of 656.7, troponin of 20 4 repeat at 26,EKG shows pacer spikes and a biventricular pacemaker heart rate of 72 PR 170 QRS of 158 and QTc of 492. EKG is similar to December 06, 2023. - CBC shows normal white count 4.8 hemoglobin 11.2 MCV 107.7 platelets of 158.  >>While in the ED patient received the following: Medications  furosemide  (LASIX ) injection 40 mg (40 mg Intravenous Given 02/25/24 1701)   Review of Systems  Respiratory:  Positive for shortness of breath.   Cardiovascular:  Positive for leg swelling.   Past Medical History:  Diagnosis Date   AICD (automatic cardioverter/defibrillator) present    Anemia    Arthritis    mild in my joints & knuckles (05/10/2016)   CHF (congestive heart failure) (HCC)    Coronary artery disease    Hemochromatosis    Hyperlipidemia    Ischemic cardiomyopathy    EF 36%; prior anterior MI, s/p CABG x 3; s/p cath Feb 2013 showing grafts to be patent with severe LV dysfunction   Myocardial infarction [facility]) 2006  PAF (paroxysmal atrial fibrillation) (HCC)    Pneumonia 2000   RBBB (right bundle branch block)    Sleep apnea    can't tolerate mask (05/10/2016)   Type II diabetes mellitus (HCC)    Ventricular tachycardia, 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   Past Surgical History:  Procedure Laterality Date   BI-VENTRICULAR IMPLANTABLE CARDIOVERTER DEFIBRILLATOR N/A 08/21/2011   Procedure: BI-VENTRICULAR IMPLANTABLE CARDIOVERTER DEFIBRILLATOR  (CRT-D);  Surgeon: Elspeth JAYSON Sage, MD;  Location: [facility] CATH LAB;  Service: Cardiovascular;  Laterality: N/A;   BIV ICD GENERTAOR CHANGE OUT N/A 06/12/2014   Procedure: BIV ICD GENERTAOR CHANGE OUT;  Surgeon: Elspeth JAYSON Sage, MD;  Location: [facility] CATH LAB;  Service: Cardiovascular;  Laterality: N/A;   BIV ICD INSERTION CRT-D  N/A 08/15/2022   Procedure: BIV ICD INSERTION CRT-D;  Surgeon: Waddell Danelle ORN, MD;  Location: [facility] INVASIVE CV LAB;  Service: Cardiovascular;  Laterality: N/A;   CARDIAC CATHETERIZATION N/A 01/19/2016   Procedure: Left Heart Cath and Cors/Grafts Angiography;  Surgeon: Peter M Jordan, MD;  Location: [facility] INVASIVE CV LAB;  Service: Cardiovascular;  Laterality: N/A;   CORONARY ARTERY BYPASS GRAFT  2006   Emergent CABG x 3 with LIMA to LAD, SVG to OM, SVG to distal RCA per Dr. Army   FRACTURE SURGERY     INGUINAL HERNIA REPAIR  02/09/2012   Procedure: HERNIA REPAIR INGUINAL ADULT;  Surgeon: Lynwood MALVA Pina, MD;  Location: [facility] OR;  Service: General;  Laterality: Right;   INGUINAL HERNIA REPAIR Bilateral [phone]   INSERTION OF MESH  02/09/2012   Procedure: INSERTION OF MESH;  Surgeon: Lynwood MALVA Pina, MD;  Location: [facility] OR;  Service: General;  Laterality: Right;   LEFT HEART CATHETERIZATION WITH CORONARY/GRAFT ANGIOGRAM N/A 05/25/2011   Procedure: LEFT HEART CATHETERIZATION WITH EL BILE;  Surgeon: Peter M Jordan, MD;  Location: [facility] CATH LAB;  Service: Cardiovascular;  Laterality: N/A;   LIVER BIOPSY  1990s   SHOULDER OPEN ROTATOR CUFF REPAIR Bilateral ?2000/~ 2007   left; right    V TACH ABLATION N/A 06/08/2016   Procedure: V Tach Ablation;  Surgeon: Lynwood Rakers, MD;  Location: [facility] INVASIVE CV LAB;  Service: Cardiovascular;  Laterality: N/A;   VENTRICULAR ABLATION SURGERY  06/08/2016   WRIST FRACTURE SURGERY Left 06/2005   plates and screws and cadavaer bone    reports that he quit smoking about 18 years ago. His smoking use included cigarettes. He started smoking about 48 years ago. He has a 60 pack-year smoking history. He has never used smokeless tobacco. He reports current alcohol use of about 3.0 standard drinks of alcohol per week. He reports that he does not use drugs. Allergies  Allergen Reactions   Entresto  [Sacubitril -Valsartan ] Other (See Comments)    Hypotension, increased  heart rate, dizziness, blurry vision   Linagliptin-Metformin  Hcl     chest pain, GI upset   Amphotericin B Lipid Complex [Amphotericin B]     MYALGIAS   Vytorin [Ezetimibe -Simvastatin]     MYALGIAS   Evolocumab       Elevated BS   Statins Other (See Comments)    Myalgias    Family History  Adopted: Yes   Prior to Admission medications   Medication Sig Start Date End Date Taking? Authorizing Provider  ALPRAZolam  (XANAX ) 0.25 MG tablet Take 0.25 mg by mouth at bedtime. 07/14/22   [provider]  ALPRAZolam  (XANAX ) 0.5 MG tablet Take 1 tablet (0.5 mg total) by mouth at bedtime as needed. 02/20/24   Dohmeier, Dedra, MD  aspirin  81 MG tablet Take 81 mg by mouth daily.    [provider]  carvedilol  (COREG ) 25 MG tablet Take 1 tablet (25 mg total) by mouth 2 (two) times daily with a meal. 12/06/23   Jordan, Peter M, MD  fenofibrate  160 MG tablet Take 1 tablet (160 mg total) by mouth daily. 12/06/23   Jordan, Peter M, MD  fluticasone  (FLONASE ) 50 MCG/ACT nasal spray Place 1 spray into both nostrils daily as needed for allergies or rhinitis.    [provider]  furosemide  (LASIX ) 40 MG tablet Take 1 tablet (40 mg total) by mouth daily. 12/06/23 11/30/24  Jordan, Peter M, MD  lisinopril  (ZESTRIL ) 10 MG tablet Take 10 mg in the morning and 5 mg in the evening 12/06/23   Jordan, Peter M, MD  magnesium  oxide (MAG-OX) 400 MG tablet Take 1 tablet (400 mg total) by mouth 2 (two) times daily. Please make overdue appt with Dr. Fernande before anymore refills. Thank you 1st attempt Patient taking differently: Take 1 tablet by mouth daily. Please make overdue appt with Dr. Fernande before anymore refills. Thank you 1st attempt 04/19/20   Fernande Elspeth BROCKS, MD  metFORMIN  (GLUCOPHAGE ) 500 MG tablet Take 1,000 mg by mouth 2 (two) times daily with a meal.    [provider]  NITROSTAT  0.4 MG SL tablet PLACE 1 TABLET UNDER THE TONGUE EVERY 5 MINUTES AS NEEDED FOR CHEST PAIN FOR UP TO 3  DOSES. 04/28/15   Army Katheryn BROCKS, NP  [facility] VERIO test strip 1 each by Other route as needed for other.  09/29/15   [provider]  rosuvastatin  (CRESTOR ) 20 MG tablet Take 1 tablet by mouth every Sunday.    [provider]  sotalol  (BETAPACE ) 160 MG tablet Take 1 tablet (160 mg total) by mouth daily. 12/06/23   Jordan, Peter M, MD  spironolactone  (ALDACTONE ) 25 MG tablet Take 0.5 tablets (12.5 mg total) by mouth daily. Pt needs to schedule appt with provider for further refills - 1st attempt 12/06/23   Jordan, Peter M, MD                                                                                 Vitals:   02/25/24 1700 02/25/24 1715 02/25/24 1730 02/25/24 1845  BP: (!) 97/55 (!) 108/56 (!) 99/55 (!) 101/51  Pulse: (!) 59 62 60 (!) 59  Resp: 14 15 15 16   Temp:      TempSrc:      SpO2: 93% 96% 92% 97%  Weight:      Height:       Physical Exam Vitals reviewed.  Constitutional:      General: He is not in acute distress.    Appearance: He is obese. He is not ill-appearing.  HENT:     Head: Normocephalic.  Eyes:     Extraocular Movements: Extraocular movements intact.  Cardiovascular:     Rate and Rhythm: Normal rate and regular rhythm.     Pulses: Normal pulses.     Heart sounds: Normal heart sounds.  Pulmonary:     Breath sounds: Examination of the right-middle field reveals rales. Examination of the left-middle field  reveals rales. Examination of the right-lower field reveals rales. Examination of the left-lower field reveals rales. Rales present.  Abdominal:     General: Abdomen is protuberant. There is no distension.     Palpations: Abdomen is soft.     Tenderness: There is no abdominal tenderness.  Musculoskeletal:     Right lower leg: Edema present.     Left lower leg: Edema present.  Skin:    General: Skin is warm.  Neurological:     General: No focal deficit present.     Mental Status: He is alert and oriented to person, place, and time.      Cranial Nerves: No dysarthria or facial asymmetry.  Psychiatric:        Attention and Perception: Attention normal.        Mood and Affect: Mood normal.        Speech: Speech normal.        Behavior: Behavior is cooperative.     Labs on Admission: I have personally reviewed following labs and imaging studies CBC: Recent Labs  Lab 02/25/24 1417  WBC 4.8  NEUTROABS 3.5  HGB 11.2*  HCT 34.9*  MCV 107.7*  PLT 158   Basic Metabolic Panel: Recent Labs  Lab 02/25/24 1417  NA 135  K 4.7  CL 101  CO2 19*  GLUCOSE 93  BUN 88*  CREATININE 4.02*  CALCIUM  9.1  MG 2.3   GFR: Estimated Creatinine Clearance: 21.9 mL/min (A) (by C-G formula based on SCr of 4.02 mg/dL (H)). Liver Function Tests: Recent Labs  Lab 02/25/24 1417  AST 34  ALT 21  ALKPHOS 43  BILITOT 1.1  PROT 6.6  ALBUMIN  3.3*   No results for input(s): LIPASE, AMYLASE in the last 168 hours. No results for input(s): AMMONIA in the last 168 hours. Recent Labs    02/25/24 1417  BUN 88*  CREATININE 4.02*    Cardiac Enzymes: No results for input(s): CKTOTAL, CKMB, CKMBINDEX, TROPONINI in the last 168 hours. BNP (last 3 results) No results for input(s): PROBNP in the last 8760 hours. HbA1C: No results for input(s): HGBA1C in the last 72 hours. CBG: No results for input(s): GLUCAP in the last 168 hours. Lipid Profile: No results for input(s): CHOL, HDL, LDLCALC, TRIG, CHOLHDL, LDLDIRECT in the last 72 hours. Thyroid Function Tests: No results for input(s): TSH, T4TOTAL, FREET4, T3FREE, THYROIDAB in the last 72 hours. Anemia Panel: No results for input(s): VITAMINB12, FOLATE, FERRITIN, TIBC, IRON, RETICCTPCT in the last 72 hours. Urine analysis:    Component Value Date/Time   COLORURINE YELLOW 06/09/2008 1200   APPEARANCEUR CLEAR 06/09/2008 1200   LABSPEC 1.022 06/09/2008 1200   PHURINE 7.0 06/09/2008 1200   GLUCOSEU NEGATIVE 06/09/2008 1200    HGBUR NEGATIVE 06/09/2008 1200   BILIRUBINUR NEGATIVE 06/09/2008 1200   KETONESUR NEGATIVE 06/09/2008 1200   PROTEINUR NEGATIVE 06/09/2008 1200   UROBILINOGEN 1.0 06/09/2008 1200   NITRITE NEGATIVE 06/09/2008 1200   LEUKOCYTESUR  06/09/2008 1200    NEGATIVE MICROSCOPIC NOT DONE ON URINES WITH NEGATIVE PROTEIN, BLOOD, LEUKOCYTES, NITRITE, OR GLUCOSE <1000 mg/dL.   Radiological Exams on Admission: DG Chest 1 View Result Date: 02/25/2024 CLINICAL DATA:  Shortness of breath and bilateral lower extremity swelling. EXAM: CHEST  1 VIEW COMPARISON:  Aug 16, 2022 FINDINGS: There is stable multi lead AICD positioning. Multiple sternal wires are present. The cardiac silhouette is mildly enlarged and unchanged in size. Low lung volumes are noted with mild atelectatic changes suspected within the  bilateral lower extremities. No pleural effusion or pneumothorax is identified. The visualized skeletal structures are unremarkable. IMPRESSION: 1. Evidence of prior median sternotomy/CABG. 2. Stable cardiomegaly with low lung volumes and mild bibasilar atelectasis. Electronically Signed   By: Suzen Dials M.D.   On: 02/25/2024 15:41   Data Reviewed: Relevant notes from primary care and specialist visits, past discharge summaries as available in EHR, including Care Everywhere . Prior diagnostic testing as pertinent to current admission diagnoses, Updated medications and problem lists for reconciliation .ED course, including vitals, labs, imaging, treatment and response to treatment,Triage notes, nursing and pharmacy notes and ED provider's notes.Notable results as noted in HPI.Discussed case with EDMD/ ED APP/ or Specialty MD on call and as needed.  Assessment & Plan  >> Shortness of breath: SpO2: 97 % 2/2 acute CHF exacerbation, continuous pulse oximetry and supplemental oxygenation.  >> Acute on chronic systolic congestive heart failure: Patient presenting clinically with volume overload on exam with  findings of lower extremity edema and crackles along with elevated BNP of 656.7 consistent with acute CHF exacerbation. Patient was given Lasix  40 mg in the emergency room. Will admit with strict I's and O's Daily weights sodium and fluid restriction. Cardiology consult for lasix  drip vs high dose diuretic and metolazone or bumex.  Continued patient on sotalol . Last Weight  Most recent update: 02/25/2024  1:33 PM    Weight  100.7 kg (222 lb)           Nutritionist consult for sodium restriction and diabetes management.   >> AKI on CKD stage IIIa: Lab Results  Component Value Date   CREATININE 4.02 (H) 02/25/2024   CREATININE 2.01 (H) 08/30/2022   CREATININE 2.46 (H) 08/18/2022  Hold PTA lisinopril , metformin .  Renally dose needed medications avoid contrast. CT abd/pelvis noncontrast for AKI and to identify any obstruction, hydronephrosis.Urinalysis ordered and pending.     >> CAD/PAD/ Hyperlipidemia: Cont asa and fenofibrate /statin.Mildly elevated troponin but flat do not suspect ischemia rather due to AKI on CKD.   >> Diabetes mellitus type 2: Glycemic protocol.  Slight scale insulin  Premeal 3 times daily . PTA metformin  held due to AKI. Last A1c on 11/21/2023 shows well-controlled A1c of 6.1.   >> OSA: Currently being followed by [facility]. Patient request continuation of his Xanax  for sleep that he has been taking for 1 month because he cannot sleep at night. Discussed with patient about oral appliance wife states it is very expensive I suggested they consider different dentists and specialists And find someone that will work with them.   DVT prophylaxis:  Heparin .  Consults:  Cardiology.  Advance Care Planning:    Code Status: Full Code   Family Communication:  None. Disposition Plan:  Home. Severity of Illness: The appropriate patient status for this patient is INPATIENT. Inpatient status is judged to be reasonable and necessary in  order to provide the required intensity of service to ensure the patient's safety. The patient's presenting symptoms, physical exam findings, and initial radiographic and laboratory data in the context of their chronic comorbidities is felt to place them at high risk for further clinical deterioration. Furthermore, it is not anticipated that the patient will be medically stable for discharge from the hospital within 2 midnights of admission.   * I certify that at the point of admission it is my clinical judgment that the patient will require inpatient hospital care spanning beyond 2 midnights from the point of admission due to high intensity of service, high  risk for further deterioration and high frequency of surveillance required.*  Unresulted Labs (From admission, onward)     Start     Ordered   02/26/24 0500  Comprehensive metabolic panel  Tomorrow morning,   R        02/25/24 1910   02/26/24 0500  CBC  Tomorrow morning,   R        02/25/24 1910   02/26/24 0500  Magnesium   Tomorrow morning,   R        02/25/24 1913   02/26/24 0500  Occult blood card to lab, stool RN will collect  Daily,   R     Question:  Specimen to be collected by:  Answer:  RN will collect   02/25/24 1918   02/25/24 1922  Urinalysis, Routine w reflex microscopic -Urine, Random  Once,   R       Question:  Specimen Source  Answer:  Urine, Random   02/25/24 1921   02/25/24 1841  Vitamin B12  (Anemia Panel (PNL))  Once,   R        02/25/24 1910   02/25/24 1841  Folate  (Anemia Panel (PNL))  Once,   R        02/25/24 1910   02/25/24 1841  Iron and TIBC  (Anemia Panel (PNL))  Once,   R        02/25/24 1910   02/25/24 1841  Ferritin  (Anemia Panel (PNL))  Once,   R        02/25/24 1910   02/25/24 1841  Reticulocytes  (Anemia Panel (PNL))  Once,   R        02/25/24 1910   02/25/24 1841  T4, free  Add-on,   AD        02/25/24 1910   02/25/24 1841  TSH  Add-on,   AD        02/25/24 1910   02/25/24 1841  Type and screen   Once,   R        02/25/24 1910   02/25/24 1830  Hemoglobin A1c  (Glycemic Control (SSI)  Q 4 Hours / Glycemic Control (SSI)  AC +/- HS)  Once,   R       Comments: To assess prior glycemic control    02/25/24 1910   02/25/24 1829  HIV Antibody (routine testing w rflx)  (HIV Antibody (Routine testing w reflex) panel)  Once,   R        02/25/24 1910           Meds ordered this encounter  Medications   furosemide  (LASIX ) injection 40 mg   DISCONTD: aspirin  chewable tablet 81 mg   rosuvastatin  (CRESTOR ) tablet 20 mg   sotalol  (BETAPACE ) tablet 160 mg   fluticasone  (FLONASE ) 50 MCG/ACT nasal spray 1 spray   nitroGLYCERIN  (NITROSTAT ) SL tablet 0.4 mg   aspirin  chewable tablet 81 mg   sodium chloride  flush (NS) 0.9 % injection 3 mL   morphine (PF) 2 MG/ML injection 2 mg   polyethylene glycol (MIRALAX / GLYCOLAX) packet 17 g   bisacodyl (DULCOLAX) EC tablet 5 mg   heparin  injection 5,000 Units   ALPRAZolam  (XANAX ) tablet 0.5 mg   fenofibrate  tablet 160 mg   pantoprazole (PROTONIX) injection 40 mg   Orders Placed This Encounter  Procedures   DG Chest 1 View   CT ABDOMEN PELVIS WO CONTRAST   Brain natriuretic peptide   Comprehensive metabolic panel   CBC  with Differential   Magnesium    HIV Antibody (routine testing w rflx)   Hemoglobin A1c   Comprehensive metabolic panel   CBC   Vitamin B12   Folate   Iron and TIBC   Ferritin   Reticulocytes   T4, free   TSH   Magnesium    Occult blood card to lab, stool RN will collect   Urinalysis, Routine w reflex microscopic -Urine, Random   Diet heart healthy/carb modified Room service appropriate? Yes; Fluid consistency: Thin; Fluid restriction: 1200 mL Fluid   ED Cardiac monitoring   Re-check Vital Signs   Notify physician (specify)   Initiate Heart Failure Care Plan   Daily weights   Strict intake and output   In and Out Cath   Patient Education:   Apply Heart Failure Care Plan   [facility] and AP only) Obtain REDS clips reading  Every morning   Maintain IV access   Vital signs   Notify physician (specify)   Mobility Protocol: No Restrictions   Refer to Sidebar Report Mobility Protocol for Adult Inpatient   Initiate Adult Central Line Maintenance and Catheter Clearance Protocol for patients with central line (CVC, PICC, Port, Hemodialysis, Trialysis)   If patient diabetic or glucose greater than 140 notify physician for Sliding Scale Insulin  Orders   Initiate CHG Protocol for patients in ICU/SD or any patient with a central line or foley catheter   Do not place and if present remove PureWick   Initiate Oral Care Protocol   Initiate Carrier Fluid Protocol   RN may order General Admission PRN Orders utilizing General Admission PRN medications (through manage orders) for the following patient needs: allergy symptoms (Claritin), cold sores (Carmex), cough (Robitussin DM), eye irritation (Liquifilm Tears), hemorrhoids (Tucks), indigestion (Maalox), minor skin irritation (Hydrocortisone Cream), muscle pain (Ben Gay), nose irritation (saline nasal spray) and sore throat (Chloraseptic spray).   Apply Diabetes Mellitus Care Plan   STAT CBG when hypoglycemia is suspected. If treated, recheck every 15 minutes after each treatment until CBG >/= 70 mg/dl   Refer to Hypoglycemia Protocol Sidebar Report for treatment of CBG < 70 mg/dl   Cardiac Monitoring - Continuous Indefinite   Ambulate with assistance   Full code   Consult to hospitalist   Inpatient consult to Cardiology Consult Timeframe: ROUTINE - requires response within 24 hours; Reason for Consult? CHF Already called   Consult to Transition of Care Team   Consult to Heart Failure Navigation Team [facility], and [facility])   Nutritional services consult   Consult to Registered Dietitian   OT eval and treat   PT eval and treat   Pulse oximetry check with vital signs   Oxygen therapy Mode or (Route): Nasal cannula; Liters Per Minute: 2; Keep O2 saturation between: greater than  92 %   Incentive spirometry   ED Pulse oximetry, continuous   I-Stat CG4 Lactic Acid   ED EKG   ECHOCARDIOGRAM COMPLETE   Type and screen   Insert peripheral IV   Admit to Inpatient (patient's expected length of stay will be greater than 2 midnights or inpatient only procedure)   Fall precautions   Author: Mario LULLA Blanch, MD 12 pm- 8 pm. [facility]. 02/25/2024 7:22 PM Please note for any communication after hours contact [facility] Assigned provider on call on Amion.

## 2024-02-25 NOTE — ED Notes (Signed)
 Extra Blue tube drawn

## 2024-02-25 NOTE — ED Triage Notes (Signed)
 Pt c/o SHOB and increased swelling in bilateral LE that started on Wednesday, has gained 15 pounds. Has been taking his lasix  and was taking double dose for 3 days. Spoke with doctor this AM and advised to come to ER.

## 2024-02-25 NOTE — Telephone Encounter (Signed)
 Pt c/o swelling/edema: STAT if pt has developed SOB within 24 hours  If swelling, where is the swelling located? Both Legs all the way up to his knees  How much weight have you gained and in what time span? NA   Have you gained 2 pounds in a day or 5 pounds in a week? Na   Do you have a log of your daily weights (if so, list)? Na   Are you currently taking a fluid pill? Yes   Are you currently SOB? Yes   Have you traveled recently in a car or plane for an extended period of time? No   Wife called in to report symptoms/ Wife is more concerned more then husband.  She stated he has pitting edema.  She stated he is pretty SOB.  She would like us  to call him.   Best number [phone]

## 2024-02-25 NOTE — ED Provider Triage Note (Signed)
 Emergency Medicine Provider Triage Evaluation Note  [name] , a 68 y.o. male  was evaluated in triage.  Pt complains of shortness of breath and fatigue..  Review of Systems  Positive: Shortness of breath Negative: Fever  Physical Exam  BP (!) 93/46 (BP Location: Left Arm)   Pulse 65   Temp (!) 97.4 F (36.3 C) (Oral)   Resp (!) 22   Ht 6' (1.829 m)   Wt 100.7 kg   SpO2 96%   BMI 30.11 kg/m  Sitting with no apparent distress.  Does get more short of breath with bending over.  Rales at the bases.  Does have pitting edema on both legs.  Medical Decision Making  Medically screening exam initiated at 2:02 PM.  Appropriate orders placed.  Sonny DELENA Holts was informed that the remainder of the evaluation will be completed by another provider, this initial triage assessment does not replace that evaluation, and the importance of remaining in the ED until their evaluation is complete.  Patient shortness of breath.  History of CHF.  Weight is reportedly up 15 pounds over the last week.  Had been on Lasix  but is had acute kidney injury within the past.  Only 3 days of increased Lasix .  Increased swelling of abdomen and increased welling in his legs.  However is somewhat hypotensive here.  May be intravascular volume depleted overall.  Will get x-ray and blood work.   Patsey Lot, MD 02/25/24 [phone]

## 2024-02-26 ENCOUNTER — Inpatient Hospital Stay (HOSPITAL_COMMUNITY)

## 2024-02-26 ENCOUNTER — Other Ambulatory Visit: Payer: Self-pay

## 2024-02-26 DIAGNOSIS — I5021 Acute systolic (congestive) heart failure: Secondary | ICD-10-CM | POA: Diagnosis not present

## 2024-02-26 DIAGNOSIS — I5023 Acute on chronic systolic (congestive) heart failure: Secondary | ICD-10-CM | POA: Diagnosis not present

## 2024-02-26 LAB — COMPREHENSIVE METABOLIC PANEL WITH GFR
ALT: 18 U/L (ref 0–44)
AST: 29 U/L (ref 15–41)
Albumin: 3.2 g/dL — ABNORMAL LOW (ref 3.5–5.0)
Alkaline Phosphatase: 35 U/L — ABNORMAL LOW (ref 38–126)
Anion gap: 13 (ref 5–15)
BUN: 93 mg/dL — ABNORMAL HIGH (ref 8–23)
CO2: 20 mmol/L — ABNORMAL LOW (ref 22–32)
Calcium: 9.1 mg/dL (ref 8.9–10.3)
Chloride: 104 mmol/L (ref 98–111)
Creatinine, Ser: 3.74 mg/dL — ABNORMAL HIGH (ref 0.61–1.24)
GFR, Estimated: 17 mL/min — ABNORMAL LOW (ref >60)
Glucose, Bld: 93 mg/dL (ref 70–99)
Potassium: 4 mmol/L (ref 3.5–5.1)
Sodium: 137 mmol/L (ref 135–145)
Total Bilirubin: 1.7 mg/dL — ABNORMAL HIGH (ref 0.0–1.2)
Total Protein: 6.2 g/dL — ABNORMAL LOW (ref 6.5–8.1)

## 2024-02-26 LAB — ECHOCARDIOGRAM COMPLETE
Area-P 1/2: 4.74 cm2
Calc EF: 35.3 %
Est EF: 30
Height: 72 in
MV M vel: 3.56 m/s
MV Peak grad: 50.7 mmHg
S' Lateral: 5 cm
Single Plane A2C EF: 35.8 %
Single Plane A4C EF: 32.6 %
Weight: 3552 [oz_av]

## 2024-02-26 LAB — CBC
HCT: 31 % — ABNORMAL LOW (ref 39.0–52.0)
Hemoglobin: 10.3 g/dL — ABNORMAL LOW (ref 13.0–17.0)
MCH: 34.3 pg — ABNORMAL HIGH (ref 26.0–34.0)
MCHC: 33.2 g/dL (ref 30.0–36.0)
MCV: 103.3 fL — ABNORMAL HIGH (ref 80.0–100.0)
Platelets: 154 K/uL (ref 150–400)
RBC: 3 MIL/uL — ABNORMAL LOW (ref 4.22–5.81)
RDW: 14.3 % (ref 11.5–15.5)
WBC: 4.1 K/uL (ref 4.0–10.5)
nRBC: 0 % (ref 0.0–0.2)

## 2024-02-26 LAB — COOXEMETRY PANEL
Carboxyhemoglobin: 2.7 % — ABNORMAL HIGH (ref 0.5–1.5)
Methemoglobin: 1 % (ref 0.0–1.5)
O2 Saturation: 61.2 %
Total hemoglobin: 12.2 g/dL (ref 12.0–16.0)

## 2024-02-26 LAB — MAGNESIUM: Magnesium: 2.2 mg/dL (ref 1.7–2.4)

## 2024-02-26 LAB — POC OCCULT BLOOD, ED: Fecal Occult Blood, POC: POSITIVE — AB

## 2024-02-26 MED ORDER — SODIUM CHLORIDE 0.9% FLUSH
10.0000 mL | Freq: Two times a day (BID) | INTRAVENOUS | Status: DC
  Administered 2024-02-26 – 2024-03-01 (×6): 10 mL

## 2024-02-26 MED ORDER — PANTOPRAZOLE SODIUM 40 MG PO TBEC
40.0000 mg | DELAYED_RELEASE_TABLET | Freq: Every day | ORAL | Status: DC
  Administered 2024-02-28 – 2024-03-01 (×3): 40 mg via ORAL
  Filled 2024-02-26 (×4): qty 1

## 2024-02-26 MED ORDER — PERFLUTREN LIPID MICROSPHERE
1.0000 mL | INTRAVENOUS | Status: AC | PRN
  Administered 2024-02-26: 3 mL via INTRAVENOUS

## 2024-02-26 MED ORDER — FUROSEMIDE 10 MG/ML IJ SOLN
5.0000 mg/h | INTRAVENOUS | Status: DC
  Administered 2024-02-26 – 2024-02-27 (×2): 5 mg/h via INTRAVENOUS
  Filled 2024-02-26 (×2): qty 20

## 2024-02-26 MED ORDER — CHLORHEXIDINE GLUCONATE CLOTH 2 % EX PADS
6.0000 | MEDICATED_PAD | Freq: Every day | CUTANEOUS | Status: DC
Start: 2024-02-26 — End: 2024-03-01
  Administered 2024-02-27 – 2024-02-29 (×2): 6 via TOPICAL

## 2024-02-26 MED ORDER — SODIUM CHLORIDE 0.9% FLUSH
10.0000 mL | INTRAVENOUS | Status: DC | PRN
  Administered 2024-02-27: 10 mL

## 2024-02-26 MED ORDER — MIDODRINE HCL 5 MG PO TABS
2.5000 mg | ORAL_TABLET | Freq: Two times a day (BID) | ORAL | Status: DC
  Administered 2024-02-26: 2.5 mg via ORAL
  Filled 2024-02-26: qty 1

## 2024-02-26 NOTE — Plan of Care (Signed)
  Problem: Education: Goal: Ability to describe self-care measures that may prevent or decrease complications (Diabetes Survival Skills Education) will improve Outcome: Progressing   Problem: Coping: Goal: Ability to adjust to condition or change in health will improve Outcome: Progressing

## 2024-02-26 NOTE — Evaluation (Deleted)
 Occupational Therapy Evaluation Patient Details Name: [name] MRN: [id number] DOB: Feb 24, 1956 Today's Date: 02/26/2024   History of Present Illness   [name] is a 68 y.o. male presented today for shortness of breath for 5 days and increased weight. Found to have CHF exacerbation and acute on chronic systolic congestive heart failurePHMx: ischemic CM and has CRT-D in place for primary prevention, RBBB, PAF, DM2, OSA, CAD s/p CABG, MI , Anemia, Arthritis, Hemochromatosis, Hyperlipidemia, PAF, Pneumonia, Bil shoulder rotator cuff repair, and Sleep apnea.     Clinical Impressions This 68 yo male admitted with above presents to acute OT with PLOF of being independent to Mod I with all basic ADLs from a wheelchair most of the time but having to use a RW to hop into the bathroom. He currently is Max A-total A +2 for in and OOB and setup-total A+2 for basic ADLs. His wife passed away 2 weeks ago and she did a lot to care for him per chart. He will continue to benefit from acute OT with follow up from continued inpatient follow up therapy, <3 hours/day.      If plan is discharge home, recommend the following:   Two people to help with walking and/or transfers;Two people to help with bathing/dressing/bathroom;Assistance with cooking/housework;Help with stairs or ramp for entrance;Assist for transportation     Functional Status Assessment   Patient has had a recent decline in their functional status and demonstrates the ability to make significant improvements in function in a reasonable and predictable amount of time.     Equipment Recommendations   Other (comment) (TBD next venue)      Precautions/Restrictions   Precautions Precautions: Fall Recall of Precautions/Restrictions: Impaired Restrictions Weight Bearing Restrictions Per Provider Order: No     Mobility Bed Mobility Overal bed mobility: Needs Assistance Bed Mobility: Supine to Sit, Sit to Supine      Supine to sit: Max assist, HOB elevated, Used rails (VCs for where to put hands) Sit to supine: +2 for physical assistance, Max assist   General bed mobility comments: due to positioning in bed and safety       Balance Overall balance assessment: Needs assistance Sitting-balance support: No upper extremity supported (RLE on floor) Sitting balance-Leahy Scale: Fair         Standing balance comment: Did not attempt to stand due to weakness of patient                           ADL either performed or assessed with clinical judgement   ADL Overall ADL's : Needs assistance/impaired Eating/Feeding: Independent;Sitting Eating/Feeding Details (indicate cue type and reason): edge of stretcher Grooming: Set up;Sitting Grooming Details (indicate cue type and reason): EOB Upper Body Bathing: Minimal assistance;Sitting Upper Body Bathing Details (indicate cue type and reason): EOB Lower Body Bathing: Maximal assistance;Bed level   Upper Body Dressing : Minimal assistance;Sitting Upper Body Dressing Details (indicate cue type and reason): EOB Lower Body Dressing: Total assistance;Bed level                       Vision Baseline Vision/History: 1 Wears glasses (reading) Ability to See in Adequate Light: 0 Adequate Patient Visual Report: No change from baseline              Pertinent Vitals/Pain Pain Assessment Pain Assessment: No/denies pain     Extremity/Trunk Assessment Upper Extremity Assessment Upper Extremity Assessment: Generalized weakness  Communication Communication Communication: No apparent difficulties   Cognition Arousal: Alert Behavior During Therapy: Flat affect Cognition: No apparent impairments                               Following commands: Intact       Cueing   Cueing Techniques: Verbal cues              Home Living Family/patient expects to be discharged to:: Private residence Living  Arrangements: Children Available Help at Discharge: Family;Available PRN/intermittently Type of Home: House Home Access: Ramped entrance     Home Layout: One level     Bathroom Shower/Tub: Sponge bathes at baseline   Bathroom Toilet: Handicapped height Bathroom Accessibility: Yes How Accessible: Accessible via walker Home Equipment: Rolling Walker (2 wheels);Wheelchair - power   Additional Comments: Wife passed away 2 weeks ago      Prior Functioning/Environment Prior Level of Function : Independent/Modified Independent                    OT Problem List: Decreased strength;Impaired balance (sitting and/or standing);Obesity   OT Treatment/Interventions: Self-care/ADL training;Therapeutic exercise;Therapeutic activities;DME and/or AE instruction;Patient/family education      OT Goals(Current goals can be found in the care plan section)   Acute Rehab OT Goals Patient Stated Goal: to get stronger OT Goal Formulation: With patient Time For Goal Achievement: 03/11/24 Potential to Achieve Goals: Good   OT Frequency:  Min 2X/week       AM-PAC OT 6 Clicks Daily Activity     Outcome Measure Help from another person eating meals?: None Help from another person taking care of personal grooming?: A Little Help from another person toileting, which includes using toliet, bedpan, or urinal?: Total Help from another person bathing (including washing, rinsing, drying)?: A Lot Help from another person to put on and taking off regular upper body clothing?: A Lot Help from another person to put on and taking off regular lower body clothing?: Total 6 Click Score: 13   End of Session    Activity Tolerance: Patient tolerated treatment well Patient left: in bed;with call bell/phone within reach;with bed alarm set  OT Visit Diagnosis: Other abnormalities of gait and mobility (R26.89);Muscle weakness (generalized) (M62.81)                Time: 9197-9151 OT Time Calculation  (min): 46 min Charges:  OT General Charges $OT Visit: 1 Visit OT Evaluation $OT Eval Moderate Complexity: 1 Mod OT Treatments $Self Care/Home Management : 23-37 mins  Donny BECKER OT Acute Rehabilitation Services Office [phone]    Rodgers Dorothyann Distel 02/26/2024, 9:46 AM

## 2024-02-26 NOTE — Progress Notes (Signed)
 Echocardiogram 2D Echocardiogram has been performed.  Merlynn Argyle 02/26/2024, 11:16 AM

## 2024-02-26 NOTE — ED Notes (Signed)
 Cardiology paged and informed of BP 88/46 (59) and 94/51 (66), also informed of initial lactic 2 and repeat lactic 0.8, and informed unable to reliably calculate urinary output since pt wife emptied pt urinals previously. Informed that pt sating 92% RA and 97% 2L [place]. Cardiology Gail MD recommends RN to notify them if maps are consistently less than 55 or if pt has any changes or symptoms such as decreased urinary output or hypoxia.

## 2024-02-26 NOTE — Progress Notes (Addendum)
 Progress Note  Patient Name: [name] Date of Encounter: 02/26/2024  Primary Cardiologist: Peter Jordan, MD   Subjective   Hypotensive overnight 88/46 to 94/51 with initial lactic acid 2.0 improved to 0.8. Poor urine output measurements. Low normal O2 sat. This prompted cardiology page without any changes. Feels a little better but not near baseline  Inpatient Medications    Scheduled Meds:  aspirin   81 mg Oral Daily   fenofibrate   160 mg Oral Daily   pantoprazole (PROTONIX) IV  40 mg Intravenous Q12H   [START ON 03/02/2024] rosuvastatin   20 mg Oral Q Sun   sodium chloride  flush  3 mL Intravenous Q12H   Continuous Infusions:  PRN Meds: ALPRAZolam , bisacodyl, fluticasone , morphine injection, nitroGLYCERIN , polyethylene glycol   Vital Signs    Vitals:   02/26/24 0415 02/26/24 0445 02/26/24 0505 02/26/24 0704  BP: (!) 93/51 (!) 95/57 (!) 102/53   Pulse: 60 61 62   Resp: 15 14 18    Temp:    97.8 F (36.6 C)  TempSrc:      SpO2: 96% 95% 98%   Weight:      Height:        Intake/Output Summary (Last 24 hours) at 02/26/2024 0708 Last data filed at 02/26/2024 0300 Gross per 24 hour  Intake --  Output 600 ml  Net -600 ml   Filed Weights   02/25/24 1333  Weight: 100.7 kg    Telemetry    paced rhythm - Personally Reviewed  ECG    AV dual paced rhythm - Personally Reviewed  Physical Exam   Physical Exam Vitals and nursing note reviewed.  Constitutional:      Appearance: Normal appearance.  HENT:     Head: Normocephalic and atraumatic.  Eyes:     Conjunctiva/sclera: Conjunctivae normal.  Neck:     Comments: +JVD with HJR to ear Cardiovascular:     Rate and Rhythm: Normal rate and regular rhythm.  Pulmonary:     Effort: Pulmonary effort is normal.     Breath sounds: Rales present.  Musculoskeletal:     Comments: 3-4+ bilateral lower extremity pitting edema  Skin:    Coloration: Skin is not jaundiced or pale.  Neurological:     Mental  Status: He is alert.      Labs    Chemistry Recent Labs  Lab 02/25/24 1417 02/26/24 0305  NA 135 137  K 4.7 4.0  CL 101 104  CO2 19* 20*  GLUCOSE 93 93  BUN 88* 93*  CREATININE 4.02* 3.74*  CALCIUM  9.1 9.1  PROT 6.6 6.2*  ALBUMIN  3.3* 3.2*  AST 34 29  ALT 21 18  ALKPHOS 43 35*  BILITOT 1.1 1.7*  GFRNONAA 16* 17*  ANIONGAP 15 13     Hematology Recent Labs  Lab 02/25/24 1417 02/25/24 1948 02/26/24 0305  WBC 4.8  --  4.1  RBC 3.24* 3.11* 3.00*  HGB 11.2*  --  10.3*  HCT 34.9*  --  31.0*  MCV 107.7*  --  103.3*  MCH 34.6*  --  34.3*  MCHC 32.1  --  33.2  RDW 14.2  --  14.3  PLT 158  --  154    Cardiac EnzymesNo results for input(s): TROPONINI in the last 168 hours. No results for input(s): TROPIPOC in the last 168 hours.   BNP Recent Labs  Lab 02/25/24 1417  BNP 656.7*     DDimer No results for input(s): DDIMER in the last 168  hours.   Radiology    CT ABDOMEN PELVIS WO CONTRAST Result Date: 02/25/2024 CLINICAL DATA:  Acute renal failure EXAM: CT ABDOMEN AND PELVIS WITHOUT CONTRAST TECHNIQUE: Multidetector CT imaging of the abdomen and pelvis was performed following the standard protocol without IV contrast. RADIATION DOSE REDUCTION: This exam was performed according to the departmental dose-optimization program which includes automated exposure control, adjustment of the mA and/or kV according to patient size and/or use of iterative reconstruction technique. COMPARISON:  None Available. FINDINGS: Lower chest: There is a small to moderate-sized right pleural effusion. There is a small amount of airspace disease in the right lower lobe. Hepatobiliary: There is questionable mild gallbladder wall edema/thickening. No calcified gallstones are seen. There is no biliary ductal dilatation. The liver is within normal limits. Pancreas: Unremarkable. No pancreatic ductal dilatation or surrounding inflammatory changes. Spleen: Normal in size without focal  abnormality. Adrenals/Urinary Tract: There is mild fat stranding surrounding the bladder. There is a subcentimeter rounded hypodensity in the right kidney which is too small to characterize, probably a cyst. There is no hydronephrosis or urinary tract calculus. The adrenal glands are within normal limits. Stomach/Bowel: Stomach is within normal limits. Appendix appears normal. No evidence of bowel wall thickening, distention, or inflammatory changes. There is sigmoid colon diverticulosis. Vascular/Lymphatic: Aortic atherosclerosis. No enlarged abdominal or pelvic lymph nodes. Reproductive: Prostate gland is mildly enlarged. Other: There is a small amount of ascites and interloop fluid. There is diffuse body wall edema. Musculoskeletal: No fracture is seen. IMPRESSION: 1. Small to moderate-sized right pleural effusion. 2. Small amount of ascites. 3. Diffuse body wall edema. 4. Questionable mild gallbladder wall edema/thickening. No calcified gallstones are seen. If there is concern for cholecystitis, recommend further evaluation with ultrasound. 5. Mild fat stranding surrounding the bladder. Correlate clinically for cystitis. 6. Sigmoid colon diverticulosis. 7. Aortic atherosclerosis. Aortic Atherosclerosis (ICD10-I70.0). Electronically Signed   By: Greig Pique M.D.   On: 02/25/2024 20:46   DG Chest 1 View Result Date: 02/25/2024 CLINICAL DATA:  Shortness of breath and bilateral lower extremity swelling. EXAM: CHEST  1 VIEW COMPARISON:  Aug 16, 2022 FINDINGS: There is stable multi lead AICD positioning. Multiple sternal wires are present. The cardiac silhouette is mildly enlarged and unchanged in size. Low lung volumes are noted with mild atelectatic changes suspected within the bilateral lower extremities. No pleural effusion or pneumothorax is identified. The visualized skeletal structures are unremarkable. IMPRESSION: 1. Evidence of prior median sternotomy/CABG. 2. Stable cardiomegaly with low lung volumes  and mild bibasilar atelectasis. Electronically Signed   By: Suzen Dials M.D.   On: 02/25/2024 15:41    Cardiac Studies     Patient Profile     [name] is a 68 y.o. male with a hx of CAD s/p CABG in 2006, HFrEF with CRT-D and device change in 2024, VT with prior shocks and currently on sotalol , hyperlipidemia, hemochromatosis, RBBB, , type 2 DM and and OSA whom cardiology was consulted on 02/25/2024 for the evaluation of HF exacerbation at the request of [name].   Assessment & Plan   Acute on chronic combined systolic and diastolic heart failure with CRT-D - inaccurate I/O and weights have not been trended yet. Only received one dose of IV Lasix  40 mg. Hold home GDMT in the setting of AKI, hypotension and acute systolic heart failure. Echo pending, start Lasix  drip at 5 mg/hour - strict I/O and daily weights. Need to be very mindful of electrolytes and VT history (will  recheck this afternoon). Would hold midodrine. Will check a coox via PICC Ischemic cardiomyopathy Elevated troponin secondary to demand ischemia from heart failure AKI - cardio renal syndrome, improving - continued diuresis as above and holding nephrotoxic agents VT history A/P ablation in 2017 with CRT-D in place and history of shocks - will give sotalol  every 48 hours given creatinine clearance was 24. His most recent dose was yesterday at home therefore will restart tomorrow. Maintain K greater than 4.0 and magnesium  greater than 2.0 Hyperlipidemia CAD A/P CABG in 2006 - continue aspirin  Type II diabetes   Time spent coordinating care: 37 minutes  For questions or updates, please contact [facility] Please consult [email or web address] for contact info under        Signed, Emeline Calender, DO 02/26/2024, 7:08 AM

## 2024-02-26 NOTE — Progress Notes (Signed)
 [facility] Progress Note Patient: [name] FMW:[id number] DOB: May 28, 1955  DOA: 02/25/2024 DOS: the patient was seen and examined on 02/26/2024  Brief Hospital Course: PMH of chronic HFrEF due to ICM with CRT-D, HLD, hemochromatosis, PAF, type II DM, OSA, CAD status post CABG, obesity presented to the hospital with complaints of shortness of breath progressively worsening for last 2 months with orthopnea and PND.  Assessment and Plan: Acute on chronic HFrEF. Management per cardiology. Currently PICC line inserted. On diuresis. Will follow recommendation.  AKI on CKD 4. Does not see nephrology on a regular basis. Baseline creatinine 2.  On admission serum creatinine 4.  Likely cardiorenal hemodynamics.  Improving with diuresis.  Monitor.  Anemia likely of chronic disease. No recent hemoglobin available.  Hemoglobin dropping down from 11.0-10.2. B12 low.  Iron level stable.  Folic acid low.  Replacing. Hemoccult was positive although less likely active bleeding perforation.  Chronic diarrhea. Does not see GI. Etiology not clear but possible combination of medication as well as undiagnosed colitis. For now rule out C. difficile and provide Imodium after that.  Sleep apnea. Morbid obesity. Body mass index is 30.11 kg/m.  Patient presents with progressively worsening shortness of breath. Had a recent sleep study which is moderate to severe sleep apnea. Has hypoxia. Recommend oxygen at nighttime. Will arrange for discharge.  HLD. Plan does not take statin. Holding fibrate Management with Zetia . Repatha  was cost prohibitive.  Type 2 diabetes mellitus. On metformin . Currently holding. Sliding scale insulin .  History of recurrent VT storm. Currently on Betapace . Monitor. Maintain K more than 4 mag more than 2.  Mood disorder. Recently started on Lexapro. Monitor.  Subjective: Continues to have some shortness of breath.  Also reports bilateral lower  extremity swelling.  Feeling better than yesterday.  No nausea no vomiting.  Reports ongoing diarrhea.  Physical Exam: Basal crackles. S1-S2 present Bowel sounds are Nontender. Abdomen distended. Lower extremity edema seen.  Data Reviewed: I have Reviewed nursing notes, Vitals, and Lab results. Since last encounter, pertinent lab results CBC and BMP   . I have ordered test including CBC and BMP  .   Disposition: Status is: Inpatient Remains inpatient appropriate because: Monitor for improvement in volume status  SCDs Start: 02/26/24 0538   Family Communication: Family at bedside Level of care: Progressive   Vitals:   02/26/24 1236 02/26/24 1245 02/26/24 1330 02/26/24 1408  BP: (!) 99/56 (!) 104/51 120/64 (!) 114/52  Pulse: 60 61 61   Resp: 15 14 15    Temp:    97.7 F (36.5 C)  TempSrc:    Oral  SpO2: 92% 92% 94%   Weight:      Height:         Author: Yetta Blanch, MD 02/26/2024 7:22 PM  Please look on [email or web address] to find out who is on call.

## 2024-02-26 NOTE — Progress Notes (Signed)
 PT Cancellation Note  Patient Details Name: [name] MRN: [id number] DOB: March 18, 1956   Cancelled Treatment:    Reason Eval/Treat Not Completed: PT screened, no needs identified, will sign off. Per OT, no therapy needs.   Erven Sari Shaker 02/26/2024, 12:23 PM

## 2024-02-26 NOTE — Progress Notes (Signed)
 OT Cancellation Note  Patient Details Name: [name] MRN: [id number] DOB: Nov 05, 1955   Cancelled Treatment:    Reason Eval/Treat Not Completed: OT screened, no needs identified, will sign off. In to speak with pt and wife and they report he has been getting up and walking to the bathroom on his own and they do not foresee any issues with basic ADLs or mobility.  Donny BECKER OT Acute Rehabilitation Services Office [phone]    Rodgers Dorothyann Distel 02/26/2024, 12:16 PM

## 2024-02-26 NOTE — Progress Notes (Signed)
 Peripherally Inserted Central Catheter Placement  The IV Nurse has discussed with the patient and/or persons authorized to consent for the patient, the purpose of this procedure and the potential benefits and risks involved with this procedure.  The benefits include less needle sticks, lab draws from the catheter, and the patient may be discharged home with the catheter. Risks include, but not limited to, infection, bleeding, blood clot (thrombus formation), and puncture of an artery; nerve damage and irregular heartbeat and possibility to perform a PICC exchange if needed/ordered by physician.  Alternatives to this procedure were also discussed.  Bard Power PICC patient education guide, fact sheet on infection prevention and patient information card has been provided to patient /or left at bedside.    PICC Placement Documentation  PICC Double Lumen 02/26/24 Right Basilic 42 cm 1 cm (Active)  Indication for Insertion or Continuance of Line Vasoactive infusions 02/26/24 1628  Exposed Catheter (cm) 1 cm 02/26/24 1628  Site Assessment Clean, Dry, Intact 02/26/24 1628  Lumen #1 Status Flushed;Saline locked;Blood return noted 02/26/24 1628  Lumen #2 Status Flushed;Saline locked;Blood return noted 02/26/24 1628  Dressing Type Transparent;Securing device 02/26/24 1628  Dressing Status Antimicrobial disc/dressing in place;Clean, Dry, Intact 02/26/24 1628  Line Care Connections checked and tightened 02/26/24 1628  Line Adjustment (NICU/IV Team Only) No 02/26/24 1628  Dressing Intervention New dressing;Adhesive placed at insertion site (IV team only) 02/26/24 1628  Dressing Change Due 03/04/24 02/26/24 1628       Renaee Notice Albarece 02/26/2024, 4:31 PM

## 2024-02-27 DIAGNOSIS — I5023 Acute on chronic systolic (congestive) heart failure: Secondary | ICD-10-CM | POA: Diagnosis not present

## 2024-02-27 LAB — BASIC METABOLIC PANEL WITH GFR
Anion gap: 15 (ref 5–15)
BUN: 86 mg/dL — ABNORMAL HIGH (ref 8–23)
CO2: 23 mmol/L (ref 22–32)
Calcium: 9.6 mg/dL (ref 8.9–10.3)
Chloride: 102 mmol/L (ref 98–111)
Creatinine, Ser: 3.4 mg/dL — ABNORMAL HIGH (ref 0.61–1.24)
GFR, Estimated: 19 mL/min — ABNORMAL LOW (ref >60)
Glucose, Bld: 111 mg/dL — ABNORMAL HIGH (ref 70–99)
Potassium: 3.9 mmol/L (ref 3.5–5.1)
Sodium: 140 mmol/L (ref 135–145)

## 2024-02-27 LAB — COOXEMETRY PANEL
Carboxyhemoglobin: 2.2 % — ABNORMAL HIGH (ref 0.5–1.5)
Methemoglobin: <0.7 % (ref 0.0–1.5)
O2 Saturation: 68.7 %
Total hemoglobin: 11 g/dL — ABNORMAL LOW (ref 12.0–16.0)

## 2024-02-27 LAB — CBC
HCT: 31 % — ABNORMAL LOW (ref 39.0–52.0)
Hemoglobin: 10.5 g/dL — ABNORMAL LOW (ref 13.0–17.0)
MCH: 34.5 pg — ABNORMAL HIGH (ref 26.0–34.0)
MCHC: 33.9 g/dL (ref 30.0–36.0)
MCV: 102 fL — ABNORMAL HIGH (ref 80.0–100.0)
Platelets: 148 K/uL — ABNORMAL LOW (ref 150–400)
RBC: 3.04 MIL/uL — ABNORMAL LOW (ref 4.22–5.81)
RDW: 14.1 % (ref 11.5–15.5)
WBC: 4.3 K/uL (ref 4.0–10.5)
nRBC: 0 % (ref 0.0–0.2)

## 2024-02-27 LAB — MAGNESIUM: Magnesium: 1.9 mg/dL (ref 1.7–2.4)

## 2024-02-27 LAB — MISC LABCORP TEST (SEND OUT): Labcorp test code: 83935

## 2024-02-27 LAB — C DIFFICILE QUICK SCREEN W PCR REFLEX
C Diff antigen: NEGATIVE
C Diff interpretation: NOT DETECTED
C Diff toxin: NEGATIVE

## 2024-02-27 MED ORDER — FUROSEMIDE 10 MG/ML IJ SOLN
60.0000 mg | Freq: Two times a day (BID) | INTRAMUSCULAR | Status: DC
  Administered 2024-02-27 – 2024-03-01 (×6): 60 mg via INTRAVENOUS
  Filled 2024-02-27 (×6): qty 6

## 2024-02-27 NOTE — Progress Notes (Signed)
   Heart Failure Stewardship Pharmacist Progress Note   PCP: Shepard Ade, MD PCP-Cardiologist: Peter Jordan, MD    HPI:  68 yo M with PMH of CHF, CRT-D (2013 > upgraded in 2024), VT on sotalol , HTN, HLD, T2DM, CAD s/p CABG 2006, newly diagnosed OSA, and afib.  Known ICM with CABG in 2006 and LHC in 2017 with patent grafts. CRT-D placed in 2013. EF at that time was 45-50% and cMRI with EF 31%. Underwent VT ablation in 06/2016. Last ECHO 04/2021 with EF 25-30% with RWMA, G2DD, RV normal.   Presented to the ED on 11/17 with shortness of breath and increased LE edema with weight gain of 15 lbs. Has been taking extra doses of lasix  for 3 days without relief. Eating a high salt diet. BNP elevated. BP low with AKI. Lactic acid 2>0.8. PICC and coox placed. Initial coox 61%. ECHO 11/18 with EF 30%, global hypokinesis, RV normal.  Reports improvement in shortness of breath and LE edema. Still has pitting edema in legs and feet on exam. Warm to the touch. Coox 68%. Denies chest pain, palpitations, lightheadedness, or dizziness. States he was just diagnosed with OSA the Saturday before coming to the ED. Agreeable to using [facility] TOC pharmacy at discharge.   Current HF Medications: Diuretic: furosemide  IV 5 mg/hr  Prior to admission HF Medications: Diuretic: furosemide  40 mg daily Beta blocker: carvedilol  25 mg BID ACE/ARB/ARNI: lisinopril  10 mg qAM and 5 mg qPM MRA: spironolactone  12.5 mg daily  Pertinent Lab Values: Serum creatinine 4>>3.40 (BL ~2), BUN 86, Potassium 3.9, Sodium 140, BNP 656.7, Magnesium  1.9, A1c 5.8   Vital Signs: Weight: 214 lbs (admission weight: 222 lbs) Blood pressure: 90/40s  Heart rate: 60s  I/O: net -0.6L yesterday; net -1.4L since admission  Medication Assistance / Insurance Benefits Check: Does the patient have prescription insurance?  Yes Type of insurance plan: [facility] Medicare  Outpatient Pharmacy:  Prior to admission outpatient pharmacy: [facility] Is the  patient willing to use [facility] TOC pharmacy at discharge? Yes Is the patient willing to transition their outpatient pharmacy to utilize a [facility] outpatient pharmacy?   No    Assessment: 1. Acute on chronic systolic CHF (LVEF 30%), due to ICM. NYHA class III symptoms. - On furosemide  IV 5 mg/hr with reported good urine output. Wife was previously emptying urinals leading to inaccurate I/Os. Continue strict I/Os and daily weights. Keep K>4 and Mg>2. - Holding GDMT with AKI and hypotension to allow for aggressive diuresis   Plan: 1) Medication changes recommended at this time: - Continue IV diuresis  2) Patient assistance: - None pending  3)  Education  - Initial education completed - Full education to be completed prior to discharge  Duwaine Plant, PharmD, BCPS Heart Failure Stewardship Pharmacist Phone [phone]

## 2024-02-27 NOTE — Progress Notes (Signed)
 PICC removed per protocol per MD order. Manual pressure applied for 5 mins. Vaseline gauze, gauze, and Tegaderm applied over insertion site. No bleeding or swelling noted. Instructed patient to remain in bed for thirty mins. Educated patient about S/S of infection and when to call MD; no heavy lifting or pressure on right side for 24 hours; keep dressing dry and intact for 24 hours. Pt verbalized comprehension.

## 2024-02-27 NOTE — Hospital Course (Addendum)
 PMH of chronic HFrEF due to ICM with CRT-D, HLD, hemochromatosis, PAF, type II DM, OSA, CAD status post CABG, obesity presented to the hospital with complaints of shortness of breath progressively worsening for last 2 months with orthopnea and PND.   Assessment and Plan: Acute on chronic HFrEF. Management per cardiology. Continue IV Lasix  for now. Cardiology following.  Potentially home tomorrow pending clinical status.   AKI on CKD 4. Does not see nephrology on a regular basis.  Has seen Dr. Melia from [facility] in the past. Baseline creatinine 2.  On admission serum creatinine 4.  Likely cardiorenal hemodynamics.  Improving with diuresis.  Monitor.   Anemia likely of chronic disease.  B12 deficiency.  Relatively folic acid  deficiency. No recent hemoglobin available.   Hemoglobin dropping down from 11.0-10.2.  But now remaining stable. B12 low.  Iron level stable.  Folic acid  low.  Replacing. Hemoccult was positive although less likely active bleeding perforation.   Chronic diarrhea. Does not see GI.  No significant red flags including bleeding, weight loss. Etiology not clear but possible combination of medication as well as undiagnosed colitis.  Concern for malabsorption in the setting of B12 deficiency. C. difficile ruled out. Continue Imodium  as needed. Outpatient GI referral recommended.   Sleep apnea. Class I obesity. Body mass index is 27.93 kg/m.  Patient presents with progressively worsening shortness of breath. Had a recent sleep study which is moderate to severe sleep apnea. Has hypoxia. Recommend oxygen at nighttime. Will arrange for discharge.   HLD. Does not take statin. Holding fibrate Managed with Zetia . Repatha  was cost prohibitive. Would recommend to discuss with cardiology again outpatient about resuming Repatha .   Type 2 diabetes mellitus well-controlled without long-term insulin  use with CKD. On metformin . Currently holding.  Likely cause of his  diarrhea. Sliding scale insulin .   History of recurrent VT SP ablation 2017 and CRT-D in place on Betapace . Prior to admission on Betapace .   EP consulted.  Due to CKD limiting the use of Betapace . Initiating Toprol -XL they will follow-up in the clinic.  May consider mexiletine in the future. Maintain K more than 4 mag more than 2.   Mood disorder. Recently started on Lexapro . Monitor.  Hypokalemia. Hypomagnesemia. Replaced

## 2024-02-27 NOTE — Progress Notes (Addendum)
 Progress Note  Patient Name: [name] Date of Encounter: 02/27/2024  Primary Cardiologist: Peter Jordan, MD   Subjective   Feeling much better today and walking around. No shortness of breath. Asking to shower.   Inpatient Medications    Scheduled Meds:  Chlorhexidine  Gluconate Cloth  6 each Topical Daily   pantoprazole  40 mg Oral Daily   [START ON 03/02/2024] rosuvastatin   20 mg Oral Q Sun   sodium chloride  flush  10-40 mL Intracatheter Q12H   Continuous Infusions:  furosemide  (LASIX ) 200 mg in dextrose  5 % 100 mL (2 mg/mL) infusion 5 mg/hr (02/27/24 0741)   PRN Meds: ALPRAZolam , bisacodyl, fluticasone , nitroGLYCERIN , polyethylene glycol, sodium chloride  flush   Vital Signs    Vitals:   02/26/24 2356 02/27/24 0600 02/27/24 0841 02/27/24 0900  BP: (!) 92/38  92/67 (!) 96/47  Pulse: 67  72   Resp: 14  16   Temp: 98.1 F (36.7 C)  (!) 97.4 F (36.3 C)   TempSrc: Oral  Axillary   SpO2: 95%  90%   Weight:  97.3 kg    Height:        Intake/Output Summary (Last 24 hours) at 02/27/2024 1039 Last data filed at 02/27/2024 0700 Gross per 24 hour  Intake 762.67 ml  Output 1600 ml  Net -837.33 ml   Filed Weights   02/25/24 1333 02/26/24 1408 02/27/24 0600  Weight: 100.7 kg 99.1 kg 97.3 kg    Telemetry    paced rhythm - Personally Reviewed  ECG    AV dual paced rhythm - Personally Reviewed  Physical Exam   Physical Exam Vitals and nursing note reviewed.  Constitutional:      Appearance: Normal appearance.  HENT:     Head: Normocephalic and atraumatic.  Eyes:     Conjunctiva/sclera: Conjunctivae normal.  Cardiovascular:     Rate and Rhythm: Normal rate and regular rhythm.  Pulmonary:     Effort: Pulmonary effort is normal.     Breath sounds: Rales present.  Musculoskeletal:     Comments: 2+ bilateral lower extremity pitting edema.   Skin:    Coloration: Skin is not jaundiced or pale.  Neurological:     Mental Status: He is alert.       Labs    Chemistry Recent Labs  Lab 02/25/24 1417 02/26/24 0305 02/27/24 0600  NA 135 137 140  K 4.7 4.0 3.9  CL 101 104 102  CO2 19* 20* 23  GLUCOSE 93 93 111*  BUN 88* 93* 86*  CREATININE 4.02* 3.74* 3.40*  CALCIUM  9.1 9.1 9.6  PROT 6.6 6.2*  --   ALBUMIN  3.3* 3.2*  --   AST 34 29  --   ALT 21 18  --   ALKPHOS 43 35*  --   BILITOT 1.1 1.7*  --   GFRNONAA 16* 17* 19*  ANIONGAP 15 13 15      Hematology Recent Labs  Lab 02/25/24 1417 02/25/24 1948 02/26/24 0305 02/27/24 0640  WBC 4.8  --  4.1 4.3  RBC 3.24* 3.11* 3.00* 3.04*  HGB 11.2*  --  10.3* 10.5*  HCT 34.9*  --  31.0* 31.0*  MCV 107.7*  --  103.3* 102.0*  MCH 34.6*  --  34.3* 34.5*  MCHC 32.1  --  33.2 33.9  RDW 14.2  --  14.3 14.1  PLT 158  --  154 148*    Cardiac EnzymesNo results for input(s): TROPONINI in the last 168 hours. No  results for input(s): TROPIPOC in the last 168 hours.   BNP Recent Labs  Lab 02/25/24 1417  BNP 656.7*     DDimer No results for input(s): DDIMER in the last 168 hours.   Radiology    ECHOCARDIOGRAM COMPLETE Result Date: 02/26/2024    ECHOCARDIOGRAM REPORT   Patient Name:   FACUNDO ALLEMAND Date of Exam: 02/26/2024 Medical Rec #:  [id number]        Height:       72.0 in Accession #:    [id number]       Weight:       222.0 lb Date of Birth:  Feb 03, 1956       BSA:          2.227 m Patient Age:    68 years         BP:           114/58 mmHg Patient Gender: M                HR:           64 bpm. Exam Location:  Inpatient Procedure: 2D Echo, Cardiac Doppler, Color Doppler and Intracardiac            Opacification Agent (Both Spectral and Color Flow Doppler were            utilized during procedure). Indications:    CHF- acute systolic 428.21/150.21  History:        Patient has prior history of Echocardiogram examinations, most                 recent 04/12/2021. CAD, Defibrillator, PAD,                 Arrythmias:Tachycardia; Risk Factors:Hypertension, Diabetes and                  Former Smoker.  Sonographer:    Merlynn Argyle Referring Phys: [id number] EKTA V PATEL IMPRESSIONS  1. Left ventricular ejection fraction, by estimation, is 30%. The left ventricle has moderately decreased function. The left ventricle demonstrates global hypokinesis. The left ventricular internal cavity size was severely dilated. Left ventricular diastolic parameters are indeterminate.  2. Right ventricular systolic function is normal. The right ventricular size is normal. There is moderately elevated pulmonary artery systolic pressure.  3. Left atrial size was mildly dilated.  4. The mitral valve is normal in structure. Mild mitral valve regurgitation.  5. The aortic valve is tricuspid. Aortic valve regurgitation is not visualized.  6. The inferior vena cava is dilated in size with <50% respiratory variability, suggesting right atrial pressure of 15 mmHg. Comparison(s): The left ventricular function is unchanged. FINDINGS  Left Ventricle: Left ventricular ejection fraction, by estimation, is 30%. The left ventricle has moderately decreased function. The left ventricle demonstrates global hypokinesis. Definity  contrast agent was given IV to delineate the left ventricular endocardial borders. The left ventricular internal cavity size was severely dilated. There is no left ventricular hypertrophy. Left ventricular diastolic parameters are indeterminate. Right Ventricle: The right ventricular size is normal. Right vetricular wall thickness was not assessed. Right ventricular systolic function is normal. There is moderately elevated pulmonary artery systolic pressure. The tricuspid regurgitant velocity is  2.90 m/s, and with an assumed right atrial pressure of 15 mmHg, the estimated right ventricular systolic pressure is 48.6 mmHg. Left Atrium: Left atrial size was mildly dilated. Right Atrium: Right atrial size was normal in size. Pericardium: There is no evidence of pericardial effusion. Mitral Valve:  The mitral  valve is normal in structure. Mild mitral valve regurgitation. Tricuspid Valve: The tricuspid valve is normal in structure. Tricuspid valve regurgitation is mild. Aortic Valve: The aortic valve is tricuspid. Aortic valve regurgitation is not visualized. Pulmonic Valve: The pulmonic valve was normal in structure. Pulmonic valve regurgitation is trivial. Aorta: The aortic root and ascending aorta are structurally normal, with no evidence of dilitation. Venous: The inferior vena cava is dilated in size with less than 50% respiratory variability, suggesting right atrial pressure of 15 mmHg. IAS/Shunts: No atrial level shunt detected by color flow Doppler.  LEFT VENTRICLE PLAX 2D LVIDd:         6.50 cm      Diastology LVIDs:         5.00 cm      LV e' medial:    7.18 cm/s LV PW:         1.00 cm      LV E/e' medial:  12.8 LV IVS:        1.00 cm      LV e' lateral:   8.50 cm/s LVOT diam:     2.10 cm      LV E/e' lateral: 10.8 LV SV:         59 LV SV Index:   26 LVOT Area:     3.46 cm LV IVRT:       77 msec  LV Volumes (MOD) LV vol d, MOD A2C: 165.0 ml LV vol d, MOD A4C: 175.0 ml LV vol s, MOD A2C: 106.0 ml LV vol s, MOD A4C: 118.0 ml LV SV MOD A2C:     59.0 ml LV SV MOD A4C:     175.0 ml LV SV MOD BP:      61.3 ml RIGHT VENTRICLE            IVC RV Basal diam:  4.50 cm    IVC diam: 2.30 cm RV Mid diam:    4.10 cm RV S prime:     7.97 cm/s  PULMONARY VEINS TAPSE (M-mode): 1.0 cm     Diastolic Velocity: 18.50 cm/s                            S/D Velocity:       1.80                            Systolic Velocity:  32.50 cm/s LEFT ATRIUM             Index        RIGHT ATRIUM           Index LA diam:        5.40 cm 2.42 cm/m   RA Area:     20.60 cm LA Vol (A2C):   93.3 ml 41.89 ml/m  RA Volume:   56.30 ml  25.28 ml/m LA Vol (A4C):   64.2 ml 28.83 ml/m LA Biplane Vol: 80.0 ml 35.92 ml/m  AORTIC VALVE LVOT Vmax:   72.50 cm/s LVOT Vmean:  51.100 cm/s LVOT VTI:    0.169 m  AORTA Ao Root diam: 3.30 cm Ao Asc diam:  3.30 cm  MITRAL VALVE               TRICUSPID VALVE MV Area (PHT): 4.74 cm    TR Peak grad:   33.6 mmHg MV Decel Time: 160 msec  TR Vmax:        290.00 cm/s MR Peak grad: 50.7 mmHg MR Mean grad: 37.0 mmHg    SHUNTS MR Vmax:      356.00 cm/s  Systemic VTI:  0.17 m MR Vmean:     292.0 cm/s   Systemic Diam: 2.10 cm MV E velocity: 92.10 cm/s MV A velocity: 33.00 cm/s MV E/A ratio:  2.79 Vina Gull MD Electronically signed by Vina Gull MD Signature Date/Time: 02/26/2024/2:44:56 PM    Final    US  EKG SITE RITE Result Date: 02/26/2024 If Site Rite image not attached, placement could not be confirmed due to current cardiac rhythm.  CT ABDOMEN PELVIS WO CONTRAST Result Date: 02/25/2024 CLINICAL DATA:  Acute renal failure EXAM: CT ABDOMEN AND PELVIS WITHOUT CONTRAST TECHNIQUE: Multidetector CT imaging of the abdomen and pelvis was performed following the standard protocol without IV contrast. RADIATION DOSE REDUCTION: This exam was performed according to the departmental dose-optimization program which includes automated exposure control, adjustment of the mA and/or kV according to patient size and/or use of iterative reconstruction technique. COMPARISON:  None Available. FINDINGS: Lower chest: There is a small to moderate-sized right pleural effusion. There is a small amount of airspace disease in the right lower lobe. Hepatobiliary: There is questionable mild gallbladder wall edema/thickening. No calcified gallstones are seen. There is no biliary ductal dilatation. The liver is within normal limits. Pancreas: Unremarkable. No pancreatic ductal dilatation or surrounding inflammatory changes. Spleen: Normal in size without focal abnormality. Adrenals/Urinary Tract: There is mild fat stranding surrounding the bladder. There is a subcentimeter rounded hypodensity in the right kidney which is too small to characterize, probably a cyst. There is no hydronephrosis or urinary tract calculus. The adrenal glands are within normal  limits. Stomach/Bowel: Stomach is within normal limits. Appendix appears normal. No evidence of bowel wall thickening, distention, or inflammatory changes. There is sigmoid colon diverticulosis. Vascular/Lymphatic: Aortic atherosclerosis. No enlarged abdominal or pelvic lymph nodes. Reproductive: Prostate gland is mildly enlarged. Other: There is a small amount of ascites and interloop fluid. There is diffuse body wall edema. Musculoskeletal: No fracture is seen. IMPRESSION: 1. Small to moderate-sized right pleural effusion. 2. Small amount of ascites. 3. Diffuse body wall edema. 4. Questionable mild gallbladder wall edema/thickening. No calcified gallstones are seen. If there is concern for cholecystitis, recommend further evaluation with ultrasound. 5. Mild fat stranding surrounding the bladder. Correlate clinically for cystitis. 6. Sigmoid colon diverticulosis. 7. Aortic atherosclerosis. Aortic Atherosclerosis (ICD10-I70.0). Electronically Signed   By: Greig Pique M.D.   On: 02/25/2024 20:46   DG Chest 1 View Result Date: 02/25/2024 CLINICAL DATA:  Shortness of breath and bilateral lower extremity swelling. EXAM: CHEST  1 VIEW COMPARISON:  Aug 16, 2022 FINDINGS: There is stable multi lead AICD positioning. Multiple sternal wires are present. The cardiac silhouette is mildly enlarged and unchanged in size. Low lung volumes are noted with mild atelectatic changes suspected within the bilateral lower extremities. No pleural effusion or pneumothorax is identified. The visualized skeletal structures are unremarkable. IMPRESSION: 1. Evidence of prior median sternotomy/CABG. 2. Stable cardiomegaly with low lung volumes and mild bibasilar atelectasis. Electronically Signed   By: Suzen Dials M.D.   On: 02/25/2024 15:41    Cardiac Studies     Patient Profile     MARTINO TOMPSON is a 68 y.o. male with a hx of CAD s/p CABG in 2006, HFrEF with CRT-D and device change in 2024, VT with prior shocks and  currently on  sotalol , hyperlipidemia, hemochromatosis, RBBB, , type 2 DM and and OSA whom cardiology was consulted on 02/25/2024 for the evaluation of HF exacerbation at the request of [name].   Assessment & Plan   Acute on chronic combined systolic and diastolic heart failure with CRT-D - inaccurate I/O and weights have not been trended yet. Only received one dose of IV Lasix  40 mg. Hold home GDMT in the setting of AKI, hypotension and acute systolic heart failure. Echo with EF 30% and global hypokinesis and unchanged. Change lasix  drip to 60 mg twice daily (has been getting 120 mg total daily) - strict I/O and daily weights. Need to be very mindful of electrolytes and VT history (will recheck this afternoon). Would hold midodrine. Coox has improved 61%->68%, will discontinue PICC to reduce infection risk since he is significant improved.  Ischemic cardiomyopathy Elevated troponin secondary to demand ischemia from heart failure AKI - cardio renal syndrome, improving - continued diuresis as above and holding nephrotoxic agents VT history A/P ablation in 2017 with CRT-D in place and history of shocks - holding sotalol  dosing in the setting of AKI. His most recent dose was 11/17. Will recheck an ECG for Qtc evaluation. Maintain K greater than 4.0 and magnesium  greater than 2.0. Discussed with EP, continue to hold sotalol .  Hyperlipidemia CAD A/P CABG in 2006 - continue aspirin  Type II diabetes   Time spent coordinating care: 38 minutes  For questions or updates, please contact [facility] Please consult [email or web address] for contact info under        Signed, Emeline Calender, DO 02/27/2024, 10:39 AM

## 2024-02-27 NOTE — TOC CM/SW Note (Addendum)
 Transition of Care [facility]) - Inpatient Brief Assessment   Patient Details  Name: [name] MRN: [id number] Date of Birth: 1955/06/06  [facility]) CM/SW Contact:    Waddell Barnie Rama, RN Phone Number: 02/27/2024, 3:37 PM   Clinical Narrative: From home with spouse, has PCP and insurance on file, states has no HH services in place at this time or DME at home.  States family member  (wife) will transport them home at [employer] and family is support system, states gets medications from [facility].  Pta self ambulatory.   Per MD will need nocturnal oxygen, patient and wife are ok to use [employer] for noturnal oxygen.  Plan for dc tomorrow.   Transition of Care Asessment: Insurance and Status: Insurance coverage has been reviewed Patient has primary care physician: Yes Home environment has been reviewed: home with wife Prior level of function:: indep Prior/Current Home Services: No current home services Social Drivers of Health Review: SDOH reviewed no interventions necessary Readmission risk has been reviewed: Yes Transition of care needs: no transition of care needs at this time

## 2024-02-27 NOTE — Progress Notes (Signed)
 [facility] Progress Note Patient: DSHAWN MCNAY FMW:[id number] DOB: 09/01/1955  DOA: 02/25/2024 DOS: the patient was seen and examined on 02/27/2024  Brief Hospital Course: PMH of chronic HFrEF due to ICM with CRT-D, HLD, hemochromatosis, PAF, type II DM, OSA, CAD status post CABG, obesity presented to the hospital with complaints of shortness of breath progressively worsening for last 2 months with orthopnea and PND.   Assessment and Plan: Acute on chronic HFrEF. Management per cardiology. Because inserted on 11/18 and removed on 11/19. Continue IV Lasix  for now. Coax improving. Will follow recommendation.   AKI on CKD 4. Does not see nephrology on a regular basis.  Has seen Dr. Melia from [facility] in the past. Baseline creatinine 2.  On admission serum creatinine 4.  Likely cardiorenal hemodynamics.  Improving with diuresis.  Monitor.   Anemia likely of chronic disease. No recent hemoglobin available.   Hemoglobin dropping down from 11.0-10.2.  But now remaining stable. B12 low.  Iron level stable.  Folic acid  low.  Replacing. Hemoccult was positive although less likely active bleeding perforation.   Chronic diarrhea. Does not see GI. Etiology not clear but possible combination of medication as well as undiagnosed colitis. For now rule out C. difficile and provide Imodium  after that.   Sleep apnea. Morbid obesity. Body mass index is 29.1 kg/m.  Patient presents with progressively worsening shortness of breath. Had a recent sleep study which is moderate to severe sleep apnea. Has hypoxia. Recommend oxygen at nighttime. Will arrange for discharge.   HLD. Does not take statin. Holding fibrate Management with Zetia . Repatha  was cost prohibitive.   Type 2 diabetes mellitus. On metformin . Currently holding. Sliding scale insulin .   History of recurrent VT storm. Currently on Betapace . Monitor. Maintain K more than 4 mag more than 2.   Mood  disorder. Recently started on Lexapro . Monitor.   Subjective: No nausea no vomiting.  Continues to have diarrhea.  No abdominal pain.  Breathing okay.  Swelling of the leg improving.  Physical Exam: General: in Mild distress, No Rash Cardiovascular: S1 and S2 Present, No Murmur Respiratory: Good respiratory effort, Bilateral Air entry present.  Basal crackles, No wheezes Abdomen: Bowel Sound present, No tenderness Extremities: Improving lower extremity edema Neuro: Alert and oriented x3, no new focal deficit   Data Reviewed: I have Reviewed nursing notes, Vitals, and Lab results. Since last encounter, pertinent lab results CBC and BMP   . I have ordered test including CBC and BMP  .   Disposition: Status is: Inpatient Remains inpatient appropriate because: Monitor for improvement in heart failure.  SCDs Start: 02/26/24 0538 Family Communication: wife at bedside Level of care: Progressive   Vitals:   02/27/24 0900 02/27/24 1125 02/27/24 1558 02/27/24 1602  BP: (!) 96/47 (!) 104/48  (!) 105/48  Pulse:  65  61  Resp:  16  17  Temp:  97.8 F (36.6 C) (P) 98.1 F (36.7 C) 98.1 F (36.7 C)  TempSrc:  Oral  Oral  SpO2:  91%  94%  Weight:      Height:         Author: Yetta Blanch, MD 02/27/2024 6:15 PM  Please look on [email or web address] to find out who is on call.

## 2024-02-27 NOTE — Progress Notes (Signed)
 Initial Nutrition Assessment  DOCUMENTATION CODES:   Not applicable  INTERVENTION:  Diet education and handout provided for Low Sodium Nutrition Therapy by [employer].  Continue heart-healthy diet.    NUTRITION DIAGNOSIS:   Limited adherence to nutrition-related recommendations related to chronic illness as evidenced by  (need for diet education.).    GOAL:   Patient will meet greater than or equal to 90% of their needs    MONITOR:   PO intake, Labs  REASON FOR ASSESSMENT:   Consult Assessment of nutrition requirement/status, Diet education  ASSESSMENT:   Patient presented with SOB and was found to have acute on chronic CHF. PMH significant for DM2, CAD, pAfib, CHF, MI, AICD, dyslipidemia, CKD4, chronic diarrhea, sleep apnea, and mood disorder.  Visited the patient who states that he has no issues with appetite and PO intake and in fact, has likely been eating excessively. He has been following a heart-healthy diet since his MI years ago. He tells me he does not use a salt shaker but does admit that he eats pre-salted foods because it would not taste good without seasoning. Encouraged the patient to comply with sodium restricted diet. He is typically very physically active at home. His UBW is 205-210 lbs.  Scheduled Meds:  Chlorhexidine  Gluconate Cloth  6 each Topical Daily   pantoprazole   40 mg Oral Daily   [START ON 03/02/2024] rosuvastatin   20 mg Oral Q Sun   sodium chloride  flush  10-40 mL Intracatheter Q12H   Continuous Infusions:  furosemide  (LASIX ) 200 mg in dextrose  5 % 100 mL (2 mg/mL) infusion 5 mg/hr (02/27/24 0741)    Diet Order             Diet Heart Fluid consistency: Thin  Diet effective now                  Meal Intake: 100%  Labs:     Latest Ref Rng & Units 02/27/2024    6:00 AM 02/26/2024    3:05 AM 02/25/2024    2:17 PM  CMP  Glucose 70 - 99 mg/dL 888  93  93   BUN 8 - 23 mg/dL 86  93  88   Creatinine  0.61 - 1.24 mg/dL 6.59  6.25  5.97   Sodium 135 - 145 mmol/L 140  137  135   Potassium 3.5 - 5.1 mmol/L 3.9  4.0  4.7   Chloride 98 - 111 mmol/L 102  104  101   CO2 22 - 32 mmol/L 23  20  19    Calcium  8.9 - 10.3 mg/dL 9.6  9.1  9.1   Total Protein 6.5 - 8.1 g/dL  6.2  6.6   Total Bilirubin 0.0 - 1.2 mg/dL  1.7  1.1   Alkaline Phos 38 - 126 U/L  35  43   AST 15 - 41 U/L  29  34   ALT 0 - 44 U/L  18  21      I/O: -1.5 L since admit  NUTRITION - FOCUSED PHYSICAL EXAM:  Flowsheet Row Most Recent Value  Orbital Region No depletion  Upper Arm Region No depletion  Thoracic and Lumbar Region No depletion  Buccal Region No depletion  Temple Region No depletion  Clavicle Bone Region No depletion  Clavicle and Acromion Bone Region No depletion  Scapular Bone Region No depletion  Dorsal Hand No depletion  Patellar Region No depletion  Anterior Thigh Region No depletion  Posterior Calf  Region No depletion  Edema (RD Assessment) Moderate  [+2 BLE]  Hair Reviewed  Eyes Reviewed  Mouth Reviewed  Skin Reviewed  Nails Reviewed    EDUCATION NEEDS:   Education needs have been addressed  Skin:  Skin Assessment: Reviewed RN Assessment  Last BM:  11/18 type 6  Height:   Ht Readings from Last 1 Encounters:  02/26/24 6' (1.829 m)    Weight:   Weight Change: stable weight noted Usual Body Weight: 205-210 lbs  Edema: +2 BLE  Ideal Body Weight:  81 kg   BMI:  Body mass index is 29.1 kg/m.  Estimated Nutritional Needs:  Kcal:  2000-2400 Protein:  90-110 Fluid:  >2000    Leverne Ruth, MS, RDN, LDN [place]. [facility] See AMION for contact information

## 2024-02-28 ENCOUNTER — Encounter (HOSPITAL_COMMUNITY): Payer: Self-pay | Admitting: Internal Medicine

## 2024-02-28 ENCOUNTER — Telehealth: Payer: Self-pay | Admitting: Neurology

## 2024-02-28 DIAGNOSIS — I5023 Acute on chronic systolic (congestive) heart failure: Secondary | ICD-10-CM | POA: Diagnosis not present

## 2024-02-28 LAB — BASIC METABOLIC PANEL WITH GFR
Anion gap: 14 (ref 5–15)
BUN: 77 mg/dL — ABNORMAL HIGH (ref 8–23)
CO2: 21 mmol/L — ABNORMAL LOW (ref 22–32)
Calcium: 9.6 mg/dL (ref 8.9–10.3)
Chloride: 103 mmol/L (ref 98–111)
Creatinine, Ser: 2.85 mg/dL — ABNORMAL HIGH (ref 0.61–1.24)
GFR, Estimated: 23 mL/min — ABNORMAL LOW (ref >60)
Glucose, Bld: 229 mg/dL — ABNORMAL HIGH (ref 70–99)
Potassium: 3.4 mmol/L — ABNORMAL LOW (ref 3.5–5.1)
Sodium: 138 mmol/L (ref 135–145)

## 2024-02-28 LAB — CBC
HCT: 31.8 % — ABNORMAL LOW (ref 39.0–52.0)
Hemoglobin: 10.7 g/dL — ABNORMAL LOW (ref 13.0–17.0)
MCH: 34.6 pg — ABNORMAL HIGH (ref 26.0–34.0)
MCHC: 33.6 g/dL (ref 30.0–36.0)
MCV: 102.9 fL — ABNORMAL HIGH (ref 80.0–100.0)
Platelets: 150 K/uL (ref 150–400)
RBC: 3.09 MIL/uL — ABNORMAL LOW (ref 4.22–5.81)
RDW: 14.2 % (ref 11.5–15.5)
WBC: 4.7 K/uL (ref 4.0–10.5)
nRBC: 0 % (ref 0.0–0.2)

## 2024-02-28 LAB — MAGNESIUM: Magnesium: 1.5 mg/dL — ABNORMAL LOW (ref 1.7–2.4)

## 2024-02-28 MED ORDER — METOPROLOL SUCCINATE ER 25 MG PO TB24
25.0000 mg | ORAL_TABLET | Freq: Every day | ORAL | Status: DC
  Administered 2024-02-28 – 2024-03-01 (×3): 25 mg via ORAL
  Filled 2024-02-28 (×3): qty 1

## 2024-02-28 MED ORDER — SACCHAROMYCES BOULARDII 250 MG PO CAPS
250.0000 mg | ORAL_CAPSULE | Freq: Two times a day (BID) | ORAL | Status: DC
  Administered 2024-02-28 – 2024-03-01 (×5): 250 mg via ORAL
  Filled 2024-02-28 (×5): qty 1

## 2024-02-28 MED ORDER — POTASSIUM CHLORIDE CRYS ER 20 MEQ PO TBCR
40.0000 meq | EXTENDED_RELEASE_TABLET | Freq: Every day | ORAL | Status: DC
  Filled 2024-02-28: qty 2

## 2024-02-28 MED ORDER — CYANOCOBALAMIN 1000 MCG/ML IJ SOLN
1000.0000 ug | Freq: Every day | INTRAMUSCULAR | Status: AC
  Administered 2024-02-28 – 2024-02-29 (×2): 1000 ug via SUBCUTANEOUS
  Filled 2024-02-28 (×2): qty 1

## 2024-02-28 MED ORDER — POTASSIUM CHLORIDE CRYS ER 20 MEQ PO TBCR
40.0000 meq | EXTENDED_RELEASE_TABLET | ORAL | Status: AC
  Administered 2024-02-28: 40 meq via ORAL
  Filled 2024-02-28 (×2): qty 2

## 2024-02-28 MED ORDER — MAGNESIUM CHLORIDE 64 MG PO TBEC
1.0000 | DELAYED_RELEASE_TABLET | Freq: Every day | ORAL | Status: DC
  Administered 2024-02-28 – 2024-03-01 (×3): 64 mg via ORAL
  Filled 2024-02-28 (×3): qty 1

## 2024-02-28 MED ORDER — MAGNESIUM SULFATE 2 GM/50ML IV SOLN
2.0000 g | Freq: Once | INTRAVENOUS | Status: DC
  Filled 2024-02-28: qty 50

## 2024-02-28 MED ORDER — ESCITALOPRAM OXALATE 10 MG PO TABS
10.0000 mg | ORAL_TABLET | Freq: Every day | ORAL | Status: DC
  Administered 2024-02-28 – 2024-02-29 (×2): 10 mg via ORAL
  Filled 2024-02-28 (×2): qty 1

## 2024-02-28 MED ORDER — CHOLESTYRAMINE 4 G PO PACK
2.0000 g | PACK | Freq: Every day | ORAL | Status: DC
  Administered 2024-02-28 – 2024-02-29 (×2): 2 g via ORAL
  Filled 2024-02-28 (×2): qty 1

## 2024-02-28 MED ORDER — LOPERAMIDE HCL 2 MG PO CAPS: 2.0000 mg | ORAL_CAPSULE | ORAL | Status: DC | PRN

## 2024-02-28 MED ORDER — VITAMIN B-12 1000 MCG PO TABS
1000.0000 ug | ORAL_TABLET | Freq: Every day | ORAL | Status: DC
  Administered 2024-03-01: 1000 ug via ORAL
  Filled 2024-02-28: qty 1

## 2024-02-28 MED ORDER — MAGNESIUM SULFATE 4 GM/100ML IV SOLN
4.0000 g | Freq: Once | INTRAVENOUS | Status: AC
  Administered 2024-02-28: 4 g via INTRAVENOUS
  Filled 2024-02-28: qty 100

## 2024-02-28 MED ORDER — CARVEDILOL 12.5 MG PO TABS: 12.5000 mg | ORAL_TABLET | Freq: Two times a day (BID) | ORAL | Status: DC

## 2024-02-28 MED ORDER — FOLIC ACID 1 MG PO TABS
1.0000 mg | ORAL_TABLET | Freq: Every day | ORAL | Status: DC
  Administered 2024-02-28 – 2024-03-01 (×3): 1 mg via ORAL
  Filled 2024-02-28 (×3): qty 1

## 2024-02-28 NOTE — Progress Notes (Signed)
 Heart Failure Nurse Navigator Progress Note  PCP: Shepard Ade, MD PCP-Cardiologist: Jordan Admission Diagnosis: Acute on chronic congestive heart failure. , AKI Admitted from: Home  Presentation:   [name] presented with shortness of breath and bilateral lower leg swelling. Reports to gaining 15 pounds, has been taking lasix  and had doubled his dose for the last 3 days with no response. BP 99/55, HR 60, BNP 656, Trop 26, Creat 3.74, CXR with mild pulmonary edema, EKG shows AV dual paced rhythm without significant change from prior EKG . Pt reports he does not add salt to food only what is in it. Patient still with 2= edema to lower extremities. They are warm to touch.   Patient was educated on the sign and symptoms of heart failure, daily weights, when to call his doctor or go to the ED. Diet/ fluid restrictions, taking all medications as prescribed and attending all medical appointments. Patient verbalized his understanding of all education. A HF TOC appointment was scheduled for 03/05/2024 @ 10:30 am.   ECHO/ LVEF: 30 %   Clinical Course:  Past Medical History:  Diagnosis Date   AICD (automatic cardioverter/defibrillator) present    Anemia    Arthritis    mild in my joints & knuckles (05/10/2016)   CHF (congestive heart failure) (HCC)    Coronary artery disease    Hemochromatosis    Hyperlipidemia    Ischemic cardiomyopathy    EF 36%; prior anterior MI, s/p CABG x 3; s/p cath Feb 2013 showing grafts to be patent with severe LV dysfunction   Myocardial infarction (HCC) 2006   PAF (paroxysmal atrial fibrillation) (HCC)    Pneumonia 2000   RBBB (right bundle branch block)    Sleep apnea    can't tolerate mask (05/10/2016)   Type II diabetes mellitus (HCC)    Ventricular tachycardia, 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     Social History   Socioeconomic History   Marital status: Married    Spouse name: Not on file   Number of children: 1   Years of education: Not on  file   Highest education level: Not on file  Occupational History   Occupation: telephone service  Tobacco Use   Smoking status: Former    Current packs/day: 0.00    Average packs/day: 2.0 packs/day for 30.0 years (60.0 ttl pk-yrs)    Types: Cigarettes    Start date: 03/12/1975    Quit date: 03/11/2005    Years since quitting: 18.9   Smokeless tobacco: Never  Vaping Use   Vaping status: Never Used  Substance and Sexual Activity   Alcohol use: Yes    Alcohol/week: 3.0 standard drinks of alcohol    Types: 3 Cans of beer per week   Drug use: No   Sexual activity: Not Currently  Other Topics Concern   Not on file  Social History Narrative   Not on file   Social Drivers of Health   Financial Resource Strain: Not on file  Food Insecurity: No Food Insecurity (02/27/2024)   Hunger Vital Sign    Worried About Running Out of Food in the Last Year: Never true    Ran Out of Food in the Last Year: Never true  Transportation Needs: No Transportation Needs (02/27/2024)   PRAPARE - Administrator, Civil Service (Medical): No    Lack of Transportation (Non-Medical): No  Physical Activity: Not on file  Stress: Not on file  Social Connections: Moderately Isolated (02/27/2024)   Social Connection  and Isolation Panel    Frequency of Communication with Friends and Family: More than three times a week    Frequency of Social Gatherings with Friends and Family: More than three times a week    Attends Religious Services: Never    Database Administrator or Organizations: No    Attends Engineer, Structural: Never    Marital Status: Married   Water Engineer and Provision:  Detailed education and instructions provided on heart failure disease management including the following:  Signs and symptoms of Heart Failure When to call the physician Importance of daily weights Low sodium diet Fluid restriction Medication management Anticipated future follow-up  appointments  Patient education given on each of the above topics.  Patient acknowledges understanding via teach back method and acceptance of all instructions.  Education Materials:  Living Better With Heart Failure Booklet, HF zone tool, & Daily Weight Tracker Tool.  Patient has scale at home: Yes Patient has pill box at home: Yes    High Risk Criteria for Readmission and/or Poor Patient Outcomes: Heart failure hospital admissions (last 6 months): 1  No Show rate: 4 %  Difficult social situation: No, Lives with his wife.  Demonstrates medication adherence: Yes Primary Language: English  Literacy level: Reading, writing, and comprehension.   Barriers of Care:   Diet/ fluid restrictions ( high salt and over 64 oz per day)  Daily weights  Considerations/Referrals:   Referral made to Heart Failure Pharmacist Stewardship: Yes Referral made to Heart Failure CSW/NCM TOC: NA Referral made to Heart & Vascular TOC clinic: Yes, 03/05/2024 @ 10:30 am   Items for Follow-up on DC/TOC: Continued HF education Diet/ fluid restrictions, daily weights.    Stephane Haddock, BSN, Scientist, Clinical (histocompatibility And Immunogenetics) Only

## 2024-02-28 NOTE — Telephone Encounter (Signed)
 I spoke with the patient wife to schedule cpap SS. She stated he is still admitted in the hospital but they are sending him home with oxygen right now until everything gets situated with the CPAP.   The patient is scheduled at [facility] for 04/07/24 at 9 pm.  Mailed packet and sent mychart

## 2024-02-28 NOTE — Progress Notes (Signed)
 [facility] Progress Note Patient: [name] FMW:[id number] DOB: [birth year]  DOA: 02/25/2024 DOS: the patient was seen and examined on 02/28/2024  Brief Hospital Course: PMH of chronic HFrEF due to ICM with CRT-D, HLD, hemochromatosis, PAF, type II DM, OSA, CAD status post CABG, obesity presented to the hospital with complaints of shortness of breath progressively worsening for last 2 months with orthopnea and PND.   Assessment and Plan: Acute on chronic HFrEF. Management per cardiology. Because inserted on 11/18 and removed on 11/19. Continue IV Lasix  for now. Coax improving. Will follow recommendation.   AKI on CKD 4. Does not see nephrology on a regular basis.  Has seen Dr. Melia from [facility] in the past. Baseline creatinine 2.  On admission serum creatinine 4.  Likely cardiorenal hemodynamics.  Improving with diuresis.  Monitor.   Anemia likely of chronic disease.  B12 deficiency.  Relatively folic acid deficiency. No recent hemoglobin available.   Hemoglobin dropping down from 11.0-10.2.  But now remaining stable. B12 low.  Iron level stable.  Folic acid low.  Replacing. Hemoccult was positive although less likely active bleeding perforation.   Chronic diarrhea. Does not see GI. Etiology not clear but possible combination of medication as well as undiagnosed colitis. For now rule out C. difficile and provide Imodium after that.   Sleep apnea. Morbid obesity. Body mass index is 29.1 kg/m.  Patient presents with progressively worsening shortness of breath. Had a recent sleep study which is moderate to severe sleep apnea. Has hypoxia. Recommend oxygen at nighttime. Will arrange for discharge.   HLD. Does not take statin. Holding fibrate Management with Zetia . Repatha  was cost prohibitive.   Type 2 diabetes mellitus. On metformin . Currently holding. Sliding scale insulin .   History of recurrent VT storm. Currently on  Betapace . Monitor. Maintain K more than 4 mag more than 2.   Mood disorder. Recently started on Lexapro. Monitor.  Hypokalemia. Hypomagnesemia. Currently replacing.   Subjective: No nausea no vomiting no fever no chills.  Feeling better.  Physical Exam: Basal crackles. S1-S2 present Bowel sounds present Lower extremity edema still present.  Data Reviewed: I have Reviewed nursing notes, Vitals, and Lab results. Since last encounter, pertinent lab results CBC and BMP   . I have ordered test including CBC and BMP  .  Discussed with cardiology  Disposition: Status is: Inpatient Remains inpatient appropriate because: Monitor for improvement in volume status  SCDs Start: 02/26/24 0538  Family Communication: No one at bedside Level of care: Progressive   Vitals:   02/28/24 0000 02/28/24 0442 02/28/24 0752 02/28/24 1212  BP: (!) 108/50 (!) 107/51 111/61 (!) 99/46  Pulse: 70 67 70 66  Resp: 18 16 20 20   Temp: 98.3 F (36.8 C) 98.1 F (36.7 C) 97.7 F (36.5 C)   TempSrc: Oral Oral Oral   SpO2: 92% 96% 93% 95%  Weight:  94.5 kg    Height:         Author: Yetta Blanch, MD 02/28/2024 4:05 PM  Please look on [email or web address] to find out who is on call.

## 2024-02-28 NOTE — Progress Notes (Signed)
 Progress Note  Patient Name: [name] Date of Encounter: 02/28/2024  Primary Cardiologist: Peter Jordan, MD   Subjective   Feeling well today. Hoping to leave soon, wife's birthday is on Sunday.   Inpatient Medications    Scheduled Meds:  carvedilol   12.5 mg Oral BID WC   Chlorhexidine  Gluconate Cloth  6 each Topical Daily   cholestyramine  2 g Oral Q0600   cyanocobalamin  1,000 mcg Subcutaneous Q0600   Followed by   NOREEN ON 03/01/2024] vitamin B-12  1,000 mcg Oral Daily   escitalopram  10 mg Oral QHS   folic acid  1 mg Oral Daily   furosemide   60 mg Intravenous BID   magnesium  chloride  1 tablet Oral Daily   pantoprazole  40 mg Oral Daily   potassium chloride   40 mEq Oral Daily   [START ON 03/02/2024] rosuvastatin   20 mg Oral Q Sun   saccharomyces boulardii  250 mg Oral BID   sodium chloride  flush  10-40 mL Intracatheter Q12H   Continuous Infusions:   PRN Meds: ALPRAZolam , bisacodyl, fluticasone , loperamide, nitroGLYCERIN , polyethylene glycol, sodium chloride  flush   Vital Signs    Vitals:   02/27/24 1934 02/28/24 0000 02/28/24 0442 02/28/24 0752  BP: (!) 108/52 (!) 108/50 (!) 107/51 111/61  Pulse: 65 70 67 70  Resp: 17 18 16 20   Temp: 98.6 F (37 C) 98.3 F (36.8 C) 98.1 F (36.7 C) 97.7 F (36.5 C)  TempSrc: Oral Oral Oral Oral  SpO2: 92% 92% 96% 93%  Weight:   94.5 kg   Height:       No intake or output data in the 24 hours ending 02/28/24 0926  Filed Weights   02/26/24 1408 02/27/24 0600 02/28/24 0442  Weight: 99.1 kg 97.3 kg 94.5 kg    Telemetry    paced rhythm with multiple runs of nonsustained VT this morning - Personally Reviewed  ECG    AV dual paced rhythm - Personally Reviewed  Physical Exam   Physical Exam Vitals and nursing note reviewed.  Constitutional:      Appearance: Normal appearance.  HENT:     Head: Normocephalic and atraumatic.  Eyes:     Conjunctiva/sclera: Conjunctivae normal.  Cardiovascular:      Rate and Rhythm: Normal rate and regular rhythm.  Pulmonary:     Effort: Pulmonary effort is normal.     Breath sounds: Rales present.  Musculoskeletal:     Comments: 2+ bilateral lower extremity edema  Skin:    Coloration: Skin is not jaundiced or pale.  Neurological:     Mental Status: He is alert.      Labs    Chemistry Recent Labs  Lab 02/25/24 1417 02/26/24 0305 02/27/24 0600 02/28/24 0827  NA 135 137 140 138  K 4.7 4.0 3.9 3.4*  CL 101 104 102 103  CO2 19* 20* 23 21*  GLUCOSE 93 93 111* 229*  BUN 88* 93* 86* 77*  CREATININE 4.02* 3.74* 3.40* 2.85*  CALCIUM  9.1 9.1 9.6 9.6  PROT 6.6 6.2*  --   --   ALBUMIN  3.3* 3.2*  --   --   AST 34 29  --   --   ALT 21 18  --   --   ALKPHOS 43 35*  --   --   BILITOT 1.1 1.7*  --   --   GFRNONAA 16* 17* 19* 23*  ANIONGAP 15 13 15 14      Hematology Recent  Labs  Lab 02/26/24 0305 02/27/24 0640 02/28/24 0827  WBC 4.1 4.3 4.7  RBC 3.00* 3.04* 3.09*  HGB 10.3* 10.5* 10.7*  HCT 31.0* 31.0* 31.8*  MCV 103.3* 102.0* 102.9*  MCH 34.3* 34.5* 34.6*  MCHC 33.2 33.9 33.6  RDW 14.3 14.1 14.2  PLT 154 148* 150    Cardiac EnzymesNo results for input(s): TROPONINI in the last 168 hours. No results for input(s): TROPIPOC in the last 168 hours.   BNP Recent Labs  Lab 02/25/24 1417  BNP 656.7*     DDimer No results for input(s): DDIMER in the last 168 hours.   Radiology    ECHOCARDIOGRAM COMPLETE Result Date: 02/26/2024    ECHOCARDIOGRAM REPORT   Patient Name:   [name] Date of Exam: 02/26/2024 Medical Rec #:  [id number]        Height:       72.0 in Accession #:    [id number]       Weight:       222.0 lb Date of Birth:  1955-07-11       BSA:          2.227 m Patient Age:    68 years         BP:           114/58 mmHg Patient Gender: M                HR:           64 bpm. Exam Location:  Inpatient Procedure: 2D Echo, Cardiac Doppler, Color Doppler and Intracardiac            Opacification Agent (Both Spectral  and Color Flow Doppler were            utilized during procedure). Indications:    CHF- acute systolic 428.21/150.21  History:        Patient has prior history of Echocardiogram examinations, most                 recent 04/12/2021. CAD, Defibrillator, PAD,                 Arrythmias:Tachycardia; Risk Factors:Hypertension, Diabetes and                 Former Smoker.  Sonographer:    Merlynn Argyle Referring Phys: [phone] EKTA V PATEL IMPRESSIONS  1. Left ventricular ejection fraction, by estimation, is 30%. The left ventricle has moderately decreased function. The left ventricle demonstrates global hypokinesis. The left ventricular internal cavity size was severely dilated. Left ventricular diastolic parameters are indeterminate.  2. Right ventricular systolic function is normal. The right ventricular size is normal. There is moderately elevated pulmonary artery systolic pressure.  3. Left atrial size was mildly dilated.  4. The mitral valve is normal in structure. Mild mitral valve regurgitation.  5. The aortic valve is tricuspid. Aortic valve regurgitation is not visualized.  6. The inferior vena cava is dilated in size with <50% respiratory variability, suggesting right atrial pressure of 15 mmHg. Comparison(s): The left ventricular function is unchanged. FINDINGS  Left Ventricle: Left ventricular ejection fraction, by estimation, is 30%. The left ventricle has moderately decreased function. The left ventricle demonstrates global hypokinesis. Definity  contrast agent was given IV to delineate the left ventricular endocardial borders. The left ventricular internal cavity size was severely dilated. There is no left ventricular hypertrophy. Left ventricular diastolic parameters are indeterminate. Right Ventricle: The right ventricular size is normal. Right vetricular wall thickness was not  assessed. Right ventricular systolic function is normal. There is moderately elevated pulmonary artery systolic pressure. The tricuspid  regurgitant velocity is  2.90 m/s, and with an assumed right atrial pressure of 15 mmHg, the estimated right ventricular systolic pressure is 48.6 mmHg. Left Atrium: Left atrial size was mildly dilated. Right Atrium: Right atrial size was normal in size. Pericardium: There is no evidence of pericardial effusion. Mitral Valve: The mitral valve is normal in structure. Mild mitral valve regurgitation. Tricuspid Valve: The tricuspid valve is normal in structure. Tricuspid valve regurgitation is mild. Aortic Valve: The aortic valve is tricuspid. Aortic valve regurgitation is not visualized. Pulmonic Valve: The pulmonic valve was normal in structure. Pulmonic valve regurgitation is trivial. Aorta: The aortic root and ascending aorta are structurally normal, with no evidence of dilitation. Venous: The inferior vena cava is dilated in size with less than 50% respiratory variability, suggesting right atrial pressure of 15 mmHg. IAS/Shunts: No atrial level shunt detected by color flow Doppler.  LEFT VENTRICLE PLAX 2D LVIDd:         6.50 cm      Diastology LVIDs:         5.00 cm      LV e' medial:    7.18 cm/s LV PW:         1.00 cm      LV E/e' medial:  12.8 LV IVS:        1.00 cm      LV e' lateral:   8.50 cm/s LVOT diam:     2.10 cm      LV E/e' lateral: 10.8 LV SV:         59 LV SV Index:   26 LVOT Area:     3.46 cm LV IVRT:       77 msec  LV Volumes (MOD) LV vol d, MOD A2C: 165.0 ml LV vol d, MOD A4C: 175.0 ml LV vol s, MOD A2C: 106.0 ml LV vol s, MOD A4C: 118.0 ml LV SV MOD A2C:     59.0 ml LV SV MOD A4C:     175.0 ml LV SV MOD BP:      61.3 ml RIGHT VENTRICLE            IVC RV Basal diam:  4.50 cm    IVC diam: 2.30 cm RV Mid diam:    4.10 cm RV S prime:     7.97 cm/s  PULMONARY VEINS TAPSE (M-mode): 1.0 cm     Diastolic Velocity: 18.50 cm/s                            S/D Velocity:       1.80                            Systolic Velocity:  32.50 cm/s LEFT ATRIUM             Index        RIGHT ATRIUM           Index LA  diam:        5.40 cm 2.42 cm/m   RA Area:     20.60 cm LA Vol (A2C):   93.3 ml 41.89 ml/m  RA Volume:   56.30 ml  25.28 ml/m LA Vol (A4C):   64.2 ml 28.83 ml/m LA Biplane Vol: 80.0 ml 35.92 ml/m  AORTIC VALVE LVOT  Vmax:   72.50 cm/s LVOT Vmean:  51.100 cm/s LVOT VTI:    0.169 m  AORTA Ao Root diam: 3.30 cm Ao Asc diam:  3.30 cm MITRAL VALVE               TRICUSPID VALVE MV Area (PHT): 4.74 cm    TR Peak grad:   33.6 mmHg MV Decel Time: 160 msec    TR Vmax:        290.00 cm/s MR Peak grad: 50.7 mmHg MR Mean grad: 37.0 mmHg    SHUNTS MR Vmax:      356.00 cm/s  Systemic VTI:  0.17 m MR Vmean:     292.0 cm/s   Systemic Diam: 2.10 cm MV E velocity: 92.10 cm/s MV A velocity: 33.00 cm/s MV E/A ratio:  2.79 Vina Gull MD Electronically signed by Vina Gull MD Signature Date/Time: 02/26/2024/2:44:56 PM    Final    US  EKG SITE RITE Result Date: 02/26/2024 If Site Rite image not attached, placement could not be confirmed due to current cardiac rhythm.   Cardiac Studies     Patient Profile     [name] is a 68 y.o. male with a hx of CAD s/p CABG in 2006, HFrEF with CRT-D and device change in 2024, VT with prior shocks and currently on sotalol , hyperlipidemia, hemochromatosis, RBBB, , type 2 DM and and OSA whom cardiology was consulted on 02/25/2024 for the evaluation of HF exacerbation at the request of [name].   Assessment & Plan   Acute on chronic combined systolic and diastolic heart failure with CRT-D - inaccurate I/O and weights have improved. PICC discontinued. Volume significantly improved with lasix  drip and now on lasix  60 IV BID. Echo with EF 30% and global hypokinesis and unchanged. Continue IV lasix , hopefully change to lasix  60 PO daily tomorrow or Friday. Strict I/O and daily weights. Start on daily K supplement with hypokalemia, lasix  and VT history. Would hold midodrine. GDMT limited due to renal dysfunction. Will restart beta blocker but change home coreg  to toprol  Xl 25 mg for less BP effect.  Ischemic cardiomyopathy Elevated troponin secondary to demand ischemia from heart failure AKI - cardio renal syndrome, improving - continued diuresis as above and holding nephrotoxic agents VT history A/P ablation in 2017 with CRT-D in place and history of shocks with NSVT this morning - holding sotalol  dosing in the setting of AKI. His most recent dose was 11/17. Maintain K greater than 4.0 and magnesium  greater than 2.0. Discussed with EP 11/19, continue to hold sotalol  due to renal dysfunction. Will start on beta blocker as above. Replace K. He will need to be restarted on sotalol  before discharge.   Hyperlipidemia with statin intolerance on repatha  at home CAD A/P CABG in 2006 - continue aspirin  Type II diabetes   Time spent coordinating care: 40 minutes  For questions or updates, please contact [facility] Please consult [email or web address] for contact info under        Signed, Emeline Calender, DO 02/28/2024, 9:26 AM

## 2024-02-28 NOTE — Plan of Care (Signed)

## 2024-02-28 NOTE — Progress Notes (Signed)
   Heart Failure Stewardship Pharmacist Progress Note   PCP: Shepard Ade, MD PCP-Cardiologist: Peter Jordan, MD    HPI:  68 yo M with PMH of CHF, CRT-D (2013 > upgraded in 2024), VT on sotalol , HTN, HLD, T2DM, CAD s/p CABG 2006, newly diagnosed OSA, and afib.  Known ICM with CABG in 2006 and LHC in 2017 with patent grafts. CRT-D placed in 2013. EF at that time was 45-50% and cMRI with EF 31%. Underwent VT ablation in 06/2016. Last ECHO 04/2021 with EF 25-30% with RWMA, G2DD, RV normal.   Presented to the ED on 11/17 with shortness of breath and increased LE edema with weight gain of 15 lbs. Has been taking extra doses of lasix  for 3 days without relief. Eating a high salt diet. BNP elevated. BP low with AKI. Lactic acid 2>0.8. PICC and coox placed. Initial coox 61%. ECHO 11/18 with EF 30%, global hypokinesis, RV normal. PICC removed on 11/19.    Reports he is feeling improvement in shortness of breath. Still has 2+ edema on legs and feet on exam. Warm to the touch. Denies chest pain, palpitations, lightheadedness, or dizziness. NSVT this AM. States he was just diagnosed with OSA the Saturday before coming to the ED. Agreeable to using [facility] TOC pharmacy at discharge.   Current HF Medications: Diuretic: furosemide  60 mg IV BID Beta Blocker: metoprolol XL 25 mg daily  Prior to admission HF Medications: Diuretic: furosemide  40 mg daily Beta blocker: carvedilol  25 mg BID ACE/ARB/ARNI: lisinopril  10 mg qAM and 5 mg qPM MRA: spironolactone  12.5 mg daily  Pertinent Lab Values: Serum creatinine 4>>3.40>2.85 (BL ~2), BUN 77, Potassium 3.4, Sodium 138, BNP 656.7, Magnesium  1.5, A1c 5.8   Vital Signs: Weight: 208 lbs (admission weight: 222 lbs) Blood pressure: 100/60s  Heart rate: 60-70s  I/O: net -0.5L yesterday; net -1.4L since admission (incomplete)  Medication Assistance / Insurance Benefits Check: Does the patient have prescription insurance?  Yes Type of insurance plan: [facility]  Medicare  Outpatient Pharmacy:  Prior to admission outpatient pharmacy: [facility] Is the patient willing to use [facility] TOC pharmacy at discharge? Yes Is the patient willing to transition their outpatient pharmacy to utilize a [facility] outpatient pharmacy?   No    Assessment: 1. Acute on chronic systolic CHF (LVEF 30%), due to ICM. NYHA class III symptoms. - On furosemide  60 mg IV BID with reported good urine output. Weight down another 6 lbs today. Wife was previously emptying urinals leading to inaccurate I/Os. Continue strict I/Os and daily weights. Keep K>4 and Mg>2. KCl 40 mEq daily ordered for replacement. Consider adding magnesium  2g IV x 1.  - Holding GDMT with AKI and hypotension to allow for aggressive diuresis. BP and renal function improving. Agree with starting metoprolol XL 25 mg daily today. Switched from coreg  to allow for more BP room.    Plan: 1) Medication changes recommended at this time: - Magnesium  2g IV x 1   2) Patient assistance: - None pending  3)  Education  - Initial education completed - Full education to be completed prior to discharge  Duwaine Plant, PharmD, BCPS Heart Failure Engineer, Building Services Phone [phone]

## 2024-02-29 DIAGNOSIS — I5023 Acute on chronic systolic (congestive) heart failure: Secondary | ICD-10-CM | POA: Diagnosis not present

## 2024-02-29 LAB — CBC
HCT: 34 % — ABNORMAL LOW (ref 39.0–52.0)
Hemoglobin: 11.2 g/dL — ABNORMAL LOW (ref 13.0–17.0)
MCH: 33.5 pg (ref 26.0–34.0)
MCHC: 32.9 g/dL (ref 30.0–36.0)
MCV: 101.8 fL — ABNORMAL HIGH (ref 80.0–100.0)
Platelets: 179 K/uL (ref 150–400)
RBC: 3.34 MIL/uL — ABNORMAL LOW (ref 4.22–5.81)
RDW: 14.1 % (ref 11.5–15.5)
WBC: 6.1 K/uL (ref 4.0–10.5)
nRBC: 0 % (ref 0.0–0.2)

## 2024-02-29 LAB — BASIC METABOLIC PANEL WITH GFR
Anion gap: 11 (ref 5–15)
BUN: 74 mg/dL — ABNORMAL HIGH (ref 8–23)
CO2: 26 mmol/L (ref 22–32)
Calcium: 9.8 mg/dL (ref 8.9–10.3)
Chloride: 101 mmol/L (ref 98–111)
Creatinine, Ser: 2.85 mg/dL — ABNORMAL HIGH (ref 0.61–1.24)
GFR, Estimated: 23 mL/min — ABNORMAL LOW (ref >60)
Glucose, Bld: 166 mg/dL — ABNORMAL HIGH (ref 70–99)
Potassium: 3.7 mmol/L (ref 3.5–5.1)
Sodium: 138 mmol/L (ref 135–145)

## 2024-02-29 LAB — MAGNESIUM: Magnesium: 2.3 mg/dL (ref 1.7–2.4)

## 2024-02-29 MED ORDER — SIMETHICONE 80 MG PO CHEW
80.0000 mg | CHEWABLE_TABLET | Freq: Four times a day (QID) | ORAL | Status: DC
  Administered 2024-02-29 – 2024-03-01 (×3): 80 mg via ORAL
  Filled 2024-02-29 (×3): qty 1

## 2024-02-29 NOTE — Progress Notes (Signed)
   Heart Failure Stewardship Pharmacist Progress Note   PCP: Shepard Ade, MD PCP-Cardiologist: Peter Jordan, MD    HPI:  68 yo M with PMH of CHF, CRT-D (2013 > upgraded in 2024), VT on sotalol , HTN, HLD, T2DM, CAD s/p CABG 2006, newly diagnosed OSA, and afib.  Known ICM with CABG in 2006 and LHC in 2017 with patent grafts. CRT-D placed in 2013. EF at that time was 45-50% and cMRI with EF 31%. Underwent VT ablation in 06/2016. Last ECHO 04/2021 with EF 25-30% with RWMA, G2DD, RV normal.   Presented to the ED on 11/17 with shortness of breath and increased LE edema with weight gain of 15 lbs. Has been taking extra doses of lasix  for 3 days without relief. Eating a high salt diet. BNP elevated. BP low with AKI. Lactic acid 2>0.8. PICC and coox placed. Initial coox 61%. ECHO 11/18 with EF 30%, global hypokinesis, RV normal. PICC removed on 11/19.    He feels well today. Still has 1+ edema on legs and feet on exam. Warm to the touch. Denies chest pain, palpitations, lightheadedness, or dizziness. Remains on IV lasix . States he was just diagnosed with OSA the Saturday before coming to the ED. Agreeable to using [facility] TOC pharmacy at discharge.   Current HF Medications: Diuretic: furosemide  60 mg IV BID Beta Blocker: metoprolol  XL 25 mg daily  Prior to admission HF Medications: Diuretic: furosemide  40 mg daily Beta blocker: carvedilol  25 mg BID ACE/ARB/ARNI: lisinopril  10 mg qAM and 5 mg qPM MRA: spironolactone  12.5 mg daily  Pertinent Lab Values: Serum creatinine 2.85 (BL ~2), BUN 74, Potassium 3.7, Sodium 138, BNP 656.7, Magnesium  2.3, A1c 5.8   Vital Signs: Weight: 205 lbs (admission weight: 222 lbs) Blood pressure: 100/50s  Heart rate: 60-70s  I/O: net -0.8L yesterday; net -1.7L since admission (incomplete)  Medication Assistance / Insurance Benefits Check: Does the patient have prescription insurance?  Yes Type of insurance plan: [facility] Medicare  Outpatient Pharmacy:  Prior to  admission outpatient pharmacy: [facility] Is the patient willing to use [facility] TOC pharmacy at discharge? Yes Is the patient willing to transition their outpatient pharmacy to utilize a [facility] outpatient pharmacy?   No    Assessment: 1. Acute on chronic systolic CHF (LVEF 30%), due to ICM. NYHA class III symptoms. - On furosemide  60 mg IV BID with reported good urine output. Weight down another 3 lbs today. Wife was previously emptying urinals leading to inaccurate I/Os. Continue strict I/Os and daily weights. Keep K>4 and Mg>2. Consider adding KCl 40 mEq  x 1 for replacement.  - Holding GDMT with AKI and hypotension to allow for aggressive diuresis. BP and renal function improving. Continue metoprolol  XL 25 mg daily. Switched from coreg  to allow for more BP room.    Plan: 1) Medication changes recommended at this time: - KCl 40 mEq  x 1  2) Patient assistance: - None pending  3)  Education  - Initial education completed - Full education to be completed prior to discharge  Duwaine Plant, PharmD, BCPS Heart Failure Stewardship Pharmacist Phone [phone]

## 2024-02-29 NOTE — Progress Notes (Signed)
 Mobility Specialist Progress Note:    02/29/24 1206  Mobility  Activity Ambulated independently  Level of Assistance Independent after set-up  Assistive Device None  Distance Ambulated (ft) 200 ft  Range of Motion/Exercises Active  Activity Response Tolerated well  Mobility Referral Yes  Mobility visit 1 Mobility  Mobility Specialist Start Time (ACUTE ONLY) 1206  Mobility Specialist Stop Time (ACUTE ONLY) 1214  Mobility Specialist Time Calculation (min) (ACUTE ONLY) 8 min   Received pt sitting on EOB agreeable to session. No c/o any symptoms. Pt feeling much better once fluid was drawn off of them. Pt moving and ambulating well. Returned pt to EOB w/ all needs met.   Venetia Keel Mobility Specialist Please Neurosurgeon or Rehab Office at [phone]

## 2024-02-29 NOTE — TOC Progression Note (Signed)
 Transition of Care [facility]) - Progression Note    Patient Details  Name: [name] MRN: [id number] Date of Birth: [birth year]  [facility] CM/SW Contact  Waddell Barnie Rama, RN Phone Number: 02/29/2024, 4:22 PM  Clinical Narrative:    Patient is set up with [employer] for nocturnal oxygen, wife will call them to have them come out to set it up.                       Expected Discharge Plan and Services                                               Social Drivers of Health (SDOH) Interventions SDOH Screenings   Food Insecurity: No Food Insecurity (02/27/2024)  Housing: Low Risk  (02/27/2024)  Transportation Needs: No Transportation Needs (02/28/2024)  Utilities: Not At Risk (02/27/2024)  Alcohol Screen: Low Risk  (02/28/2024)  Financial Resource Strain: Low Risk  (02/28/2024)  Social Connections: Moderately Isolated (02/27/2024)  Tobacco Use: Medium Risk (02/25/2024)    Readmission Risk Interventions    02/27/2024    3:35 PM  Readmission Risk Prevention Plan  Post Dischage Appt Complete  Medication Screening Complete  Transportation Screening Complete

## 2024-02-29 NOTE — Progress Notes (Signed)
 [facility] Progress Note Patient: [name] FMW:[id number] DOB: [birth year]  DOA: 02/25/2024 DOS: the patient was seen and examined on 02/29/2024  Brief Hospital Course: PMH of chronic HFrEF due to ICM with CRT-D, HLD, hemochromatosis, PAF, type II DM, OSA, CAD status post CABG, obesity presented to the hospital with complaints of shortness of breath progressively worsening for last 2 months with orthopnea and PND.   Assessment and Plan: Acute on chronic HFrEF. Management per cardiology. Continue IV Lasix  for now. Cardiology following.  Potentially home tomorrow pending clinical status.   AKI on CKD 4. Does not see nephrology on a regular basis.  Has seen Dr. Melia from [facility] in the past. Baseline creatinine 2.  On admission serum creatinine 4.  Likely cardiorenal hemodynamics.  Improving with diuresis.  Monitor.   Anemia likely of chronic disease.  B12 deficiency.  Relatively folic acid  deficiency. No recent hemoglobin available.   Hemoglobin dropping down from 11.0-10.2.  But now remaining stable. B12 low.  Iron level stable.  Folic acid  low.  Replacing. Hemoccult was positive although less likely active bleeding perforation.   Chronic diarrhea. Does not see GI.  No significant red flags including bleeding, weight loss. Etiology not clear but possible combination of medication as well as undiagnosed colitis.  Concern for malabsorption in the setting of B12 deficiency. C. difficile ruled out. Continue Imodium  as needed. Outpatient GI referral recommended.   Sleep apnea. Class I obesity. Body mass index is 27.93 kg/m.  Patient presents with progressively worsening shortness of breath. Had a recent sleep study which is moderate to severe sleep apnea. Has hypoxia. Recommend oxygen at nighttime. Will arrange for discharge.   HLD. Does not take statin. Holding fibrate Managed with Zetia . Repatha  was cost prohibitive. Would recommend to discuss with  cardiology again outpatient about resuming Repatha .   Type 2 diabetes mellitus well-controlled without long-term insulin  use with CKD. On metformin . Currently holding.  Likely cause of his diarrhea. Sliding scale insulin .   History of recurrent VT SP ablation 2017 and CRT-D in place on Betapace . Prior to admission on Betapace .   EP consulted.  Due to CKD limiting the use of Betapace . Initiating Toprol -XL they will follow-up in the clinic.  May consider mexiletine in the future. Maintain K more than 4 mag more than 2.   Mood disorder. Recently started on Lexapro . Monitor.  Hypokalemia. Hypomagnesemia. Replaced   Subjective: No nausea no vomiting no fever no chills.  No chest pain.  Physical Exam: Improving swelling of the leg.  Only the right leg swollen right now. S1-S2 present Nontender.  Clear to auscultation.  Data Reviewed: I have Reviewed nursing notes, Vitals, and Lab results. Since last encounter, pertinent lab results CBC and BMP   . I have ordered test including CBC and BMP  .  Discussed with cardiology  Disposition: Status is: Inpatient Remains inpatient appropriate because: Monitor for improvement in volume status  Place TED hose Start: 02/29/24 0807 SCDs Start: 02/26/24 0538   Family Communication: No one at bedside. Level of care: Progressive   Vitals:   02/29/24 0258 02/29/24 0346 02/29/24 0726 02/29/24 1255  BP: 104/62  (!) 97/43 (!) 101/53  Pulse: 69 63 67 63  Resp: 18  17 17   Temp: (!) 97.5 F (36.4 C)  (!) 97.5 F (36.4 C) 97.9 F (36.6 C)  TempSrc: Oral  Oral Oral  SpO2: 95% 94% 96% 96%  Weight:  93.4 kg    Height:  Author: Yetta Blanch, MD 02/29/2024 3:43 PM  Please look on [email or web address] to find out who is on call.

## 2024-02-29 NOTE — Consult Note (Addendum)
 ELECTROPHYSIOLOGY CONSULT NOTE    Patient ID: [name] MRN: [id number], DOB/AGE: 12-17-1955 68 y.o.  Admit date: 02/25/2024 Date of Consult: 02/29/2024  Primary Physician: Shepard Ade, MD Primary Cardiologist: Peter Jordan, MD  Electrophysiologist: Dr. Waddell   Referring Provider: Dr. Waddell  Patient Profile: [name] is a 68 y.o. male with a history of CAD s/p CABG x 3, type II DM, HLD, chronic systolic HF and ischemic cardiomyopathy, s/p CRT-D, VT, hemochromatosis, right bundle branch block, OSA who is being seen today for AAD recommendations at the request of Dr. Kriste.  HPI:  [name] is a 68 y.o. male with above noted medical history who presented to the ED with HF exacerbation (weight gain, peripheral edema, dyspnea). This occurred despite increased Lasix  dosing at home. Reports high salt diet prior to admission. Upon admission, also noted with hypotension, AKI. Due to AKI, patient's Sotalol  held. Patient was started on Sotalol  in 2017 due to VT. In January 2018 he presented with syncope and recurrent VT. Switched to amiodarone . Was concerned about taking amiodarone  long term. Underwent VT ablation by Dr. Kelsie on June 08, 2016. Amiodarone  later discontinued and patient put back on Sotalol . Notes also report that patient previously had tremors that were attributed to Amiodarone . Regarding Sotalol , has been taking daily due to CKD.   Patient's most recent remote report noted 1 episode of VT successfully treated with ATP in July of this year. Appears to have had no ICD shocks since 2018.   Today patient feels well and hopes to discharge. He denies chest pain, palpitations, dyspnea, PND, orthopnea, nausea, vomiting, dizziness.  Labs Potassium3.7 (11/21 0259) Magnesium   2.3 (11/21 0259) Creatinine, ser  2.85* (11/21 0259) PLT  179 (11/21 0259) HGB  11.2* (11/21 0259) WBC 6.1 (11/21 0259)  .    Past Medical History:  Diagnosis Date   AICD  (automatic cardioverter/defibrillator) present    Anemia    Arthritis    mild in my joints & knuckles (05/10/2016)   CHF (congestive heart failure) (HCC)    Coronary artery disease    Hemochromatosis    Hyperlipidemia    Ischemic cardiomyopathy    EF 36%; prior anterior MI, s/p CABG x 3; s/p cath Feb 2013 showing grafts to be patent with severe LV dysfunction   Myocardial infarction (HCC) 2006   PAF (paroxysmal atrial fibrillation) (HCC)    Pneumonia 2000   RBBB (right bundle branch block)    Sleep apnea    can't tolerate mask (05/10/2016)   Type II diabetes mellitus (HCC)    Ventricular tachycardia, 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     Surgical History:  Past Surgical History:  Procedure Laterality Date   BI-VENTRICULAR IMPLANTABLE CARDIOVERTER DEFIBRILLATOR N/A 08/21/2011   Procedure: BI-VENTRICULAR IMPLANTABLE CARDIOVERTER DEFIBRILLATOR  (CRT-D);  Surgeon: Elspeth JAYSON Sage, MD;  Location: [facility] CATH LAB;  Service: Cardiovascular;  Laterality: N/A;   BIV ICD GENERTAOR CHANGE OUT N/A 06/12/2014   Procedure: BIV ICD GENERTAOR CHANGE OUT;  Surgeon: Elspeth JAYSON Sage, MD;  Location: [facility] CATH LAB;  Service: Cardiovascular;  Laterality: N/A;   BIV ICD INSERTION CRT-D N/A 08/15/2022   Procedure: BIV ICD INSERTION CRT-D;  Surgeon: Waddell Danelle ORN, MD;  Location: [facility] INVASIVE CV LAB;  Service: Cardiovascular;  Laterality: N/A;   CARDIAC CATHETERIZATION N/A 01/19/2016   Procedure: Left Heart Cath and Cors/Grafts Angiography;  Surgeon: Peter M Jordan, MD;  Location: [facility] INVASIVE CV LAB;  Service: Cardiovascular;  Laterality: N/A;   CORONARY ARTERY  BYPASS GRAFT  2006   Emergent CABG x 3 with LIMA to LAD, SVG to OM, SVG to distal RCA per Dr. Army   FRACTURE SURGERY     INGUINAL HERNIA REPAIR  02/09/2012   Procedure: HERNIA REPAIR INGUINAL ADULT;  Surgeon: Lynwood MALVA Pina, MD;  Location: [facility] OR;  Service: General;  Laterality: Right;   INGUINAL HERNIA REPAIR Bilateral [phone]   INSERTION OF MESH  02/09/2012    Procedure: INSERTION OF MESH;  Surgeon: Lynwood MALVA Pina, MD;  Location: [facility] OR;  Service: General;  Laterality: Right;   LEFT HEART CATHETERIZATION WITH CORONARY/GRAFT ANGIOGRAM N/A 05/25/2011   Procedure: LEFT HEART CATHETERIZATION WITH EL BILE;  Surgeon: Peter M Jordan, MD;  Location: [facility] CATH LAB;  Service: Cardiovascular;  Laterality: N/A;   LIVER BIOPSY  1990s   SHOULDER OPEN ROTATOR CUFF REPAIR Bilateral ?2000/~ 2007   left; right    V TACH ABLATION N/A 06/08/2016   Procedure: V Tach Ablation;  Surgeon: Lynwood Rakers, MD;  Location: [facility] INVASIVE CV LAB;  Service: Cardiovascular;  Laterality: N/A;   VENTRICULAR ABLATION SURGERY  06/08/2016   WRIST FRACTURE SURGERY Left 06/2005   plates and screws and cadavaer bone     Medications Prior to Admission  Medication Sig Dispense Refill Last Dose/Taking   ALPRAZolam  (XANAX ) 0.25 MG tablet Take 0.25 mg by mouth at bedtime.   02/24/2024   aspirin  81 MG tablet Take 81 mg by mouth daily.   02/25/2024 Morning   carvedilol  (COREG ) 25 MG tablet Take 1 tablet (25 mg total) by mouth 2 (two) times daily with a meal. 180 tablet 3 02/25/2024 Morning   escitalopram  (LEXAPRO ) 10 MG tablet Take 10 mg by mouth at bedtime.   02/24/2024 Bedtime   fenofibrate  160 MG tablet Take 1 tablet (160 mg total) by mouth daily. (Patient taking differently: Take 160 mg by mouth at bedtime.) 90 tablet 3 02/24/2024   fluticasone  (FLONASE ) 50 MCG/ACT nasal spray Place 1 spray into both nostrils daily as needed for allergies or rhinitis.   02/24/2024   furosemide  (LASIX ) 40 MG tablet Take 1 tablet (40 mg total) by mouth daily. 90 tablet 3 02/25/2024 Morning   lisinopril  (ZESTRIL ) 10 MG tablet Take 10 mg in the morning and 5 mg in the evening 135 tablet 3 02/25/2024 Morning   magnesium  oxide (MAG-OX) 400 MG tablet Take 1 tablet (400 mg total) by mouth 2 (two) times daily. Please make overdue appt with Dr. Fernande before anymore refills. Thank you 1st attempt (Patient  taking differently: Take 1 tablet by mouth at bedtime. Please make overdue appt with Dr. Fernande before anymore refills. Thank you 1st attempt) 60 tablet 0 02/24/2024   metFORMIN  (GLUCOPHAGE ) 500 MG tablet Take 500 mg by mouth 2 (two) times daily with a meal.   02/25/2024 Morning   NITROSTAT  0.4 MG SL tablet PLACE 1 TABLET UNDER THE TONGUE EVERY 5 MINUTES AS NEEDED FOR CHEST PAIN FOR UP TO 3 DOSES. 25 tablet 3 Unknown   Probiotic Product (PROBIOTIC PO) Take 1 tablet by mouth at bedtime.   02/24/2024   rosuvastatin  (CRESTOR ) 20 MG tablet Take 1 tablet by mouth every Sunday.   02/24/2024 Morning   sotalol  (BETAPACE ) 160 MG tablet Take 1 tablet (160 mg total) by mouth daily. 90 tablet 3 02/25/2024 Morning   spironolactone  (ALDACTONE ) 25 MG tablet Take 0.5 tablets (12.5 mg total) by mouth daily. Pt needs to schedule appt with provider for further refills - 1st attempt 45 tablet 3  02/25/2024 Morning   ALPRAZolam  (XANAX ) 0.5 MG tablet Take 1 tablet (0.5 mg total) by mouth at bedtime as needed. (Patient not taking: Reported on 02/25/2024) 2 tablet 0 Not Taking   ONETOUCH VERIO test strip 1 each by Other route as needed for other.   6     Inpatient Medications:   Chlorhexidine  Gluconate Cloth  6 each Topical Daily   cholestyramine   2 g Oral Q0600   [START ON 03/01/2024] vitamin B-12  1,000 mcg Oral Daily   escitalopram   10 mg Oral QHS   folic acid   1 mg Oral Daily   furosemide   60 mg Intravenous BID   magnesium  chloride  1 tablet Oral Daily   metoprolol  succinate  25 mg Oral Daily   pantoprazole   40 mg Oral Daily   [START ON 03/02/2024] rosuvastatin   20 mg Oral Q Sun   saccharomyces boulardii  250 mg Oral BID   sodium chloride  flush  10-40 mL Intracatheter Q12H    Allergies:  Allergies  Allergen Reactions   Entresto  [Sacubitril -Valsartan ] Other (See Comments)    Hypotension, increased heart rate, dizziness, blurry vision   Linagliptin-Metformin  Hcl     chest pain, GI upset   Amphotericin B  Lipid Complex [Amphotericin B]     MYALGIAS   Vytorin [Ezetimibe -Simvastatin]     MYALGIAS   Evolocumab       Elevated BS   Statins Other (See Comments)    Myalgias     Family History  Adopted: Yes     Physical Exam: Vitals:   02/29/24 0033 02/29/24 0258 02/29/24 0346 02/29/24 0726  BP: (!) 102/58 104/62  (!) 97/43  Pulse: 65 69 63 67  Resp: 18 18  17   Temp: 97.9 F (36.6 C) (!) 97.5 F (36.4 C)  (!) 97.5 F (36.4 C)  TempSrc: Oral Oral  Oral  SpO2: 94% 95% 94% 96%  Weight:   93.4 kg   Height:        GEN- NAD, A&O x 3, normal affect HEENT: Normocephalic, atraumatic Lungs- CTAB, Normal effort.  Heart- Regular rate and rhythm, No M/G/R.  GI- Soft, NT, ND.  Extremities- No clubbing, cyanosis, or edema   Radiology/Studies: ECHOCARDIOGRAM COMPLETE Result Date: 02/26/2024    ECHOCARDIOGRAM REPORT   Patient Name:   [name] Date of Exam: 02/26/2024 Medical Rec #:  [id number]        Height:       72.0 in Accession #:    [id number]       Weight:       222.0 lb Date of Birth:  [birth year]       BSA:          2.227 m Patient Age:    59 years         BP:           114/58 mmHg Patient Gender: M                HR:           64 bpm. Exam Location:  Inpatient Procedure: 2D Echo, Cardiac Doppler, Color Doppler and Intracardiac            Opacification Agent (Both Spectral and Color Flow Doppler were            utilized during procedure). Indications:    CHF- acute systolic 428.21/150.21  History:        Patient has prior history of Echocardiogram examinations, most  recent 04/12/2021. CAD, Defibrillator, PAD,                 Arrythmias:Tachycardia; Risk Factors:Hypertension, Diabetes and                 Former Smoker.  Sonographer:    Merlynn Argyle Referring Phys: [phone] EKTA V PATEL IMPRESSIONS  1. Left ventricular ejection fraction, by estimation, is 30%. The left ventricle has moderately decreased function. The left ventricle demonstrates global hypokinesis. The left  ventricular internal cavity size was severely dilated. Left ventricular diastolic parameters are indeterminate.  2. Right ventricular systolic function is normal. The right ventricular size is normal. There is moderately elevated pulmonary artery systolic pressure.  3. Left atrial size was mildly dilated.  4. The mitral valve is normal in structure. Mild mitral valve regurgitation.  5. The aortic valve is tricuspid. Aortic valve regurgitation is not visualized.  6. The inferior vena cava is dilated in size with <50% respiratory variability, suggesting right atrial pressure of 15 mmHg. Comparison(s): The left ventricular function is unchanged. FINDINGS  Left Ventricle: Left ventricular ejection fraction, by estimation, is 30%. The left ventricle has moderately decreased function. The left ventricle demonstrates global hypokinesis. Definity  contrast agent was given IV to delineate the left ventricular endocardial borders. The left ventricular internal cavity size was severely dilated. There is no left ventricular hypertrophy. Left ventricular diastolic parameters are indeterminate. Right Ventricle: The right ventricular size is normal. Right vetricular wall thickness was not assessed. Right ventricular systolic function is normal. There is moderately elevated pulmonary artery systolic pressure. The tricuspid regurgitant velocity is  2.90 m/s, and with an assumed right atrial pressure of 15 mmHg, the estimated right ventricular systolic pressure is 48.6 mmHg. Left Atrium: Left atrial size was mildly dilated. Right Atrium: Right atrial size was normal in size. Pericardium: There is no evidence of pericardial effusion. Mitral Valve: The mitral valve is normal in structure. Mild mitral valve regurgitation. Tricuspid Valve: The tricuspid valve is normal in structure. Tricuspid valve regurgitation is mild. Aortic Valve: The aortic valve is tricuspid. Aortic valve regurgitation is not visualized. Pulmonic Valve: The pulmonic  valve was normal in structure. Pulmonic valve regurgitation is trivial. Aorta: The aortic root and ascending aorta are structurally normal, with no evidence of dilitation. Venous: The inferior vena cava is dilated in size with less than 50% respiratory variability, suggesting right atrial pressure of 15 mmHg. IAS/Shunts: No atrial level shunt detected by color flow Doppler.  LEFT VENTRICLE PLAX 2D LVIDd:         6.50 cm      Diastology LVIDs:         5.00 cm      LV e' medial:    7.18 cm/s LV PW:         1.00 cm      LV E/e' medial:  12.8 LV IVS:        1.00 cm      LV e' lateral:   8.50 cm/s LVOT diam:     2.10 cm      LV E/e' lateral: 10.8 LV SV:         59 LV SV Index:   26 LVOT Area:     3.46 cm LV IVRT:       77 msec  LV Volumes (MOD) LV vol d, MOD A2C: 165.0 ml LV vol d, MOD A4C: 175.0 ml LV vol s, MOD A2C: 106.0 ml LV vol s, MOD A4C: 118.0 ml LV SV  MOD A2C:     59.0 ml LV SV MOD A4C:     175.0 ml LV SV MOD BP:      61.3 ml RIGHT VENTRICLE            IVC RV Basal diam:  4.50 cm    IVC diam: 2.30 cm RV Mid diam:    4.10 cm RV S prime:     7.97 cm/s  PULMONARY VEINS TAPSE (M-mode): 1.0 cm     Diastolic Velocity: 18.50 cm/s                            S/D Velocity:       1.80                            Systolic Velocity:  32.50 cm/s LEFT ATRIUM             Index        RIGHT ATRIUM           Index LA diam:        5.40 cm 2.42 cm/m   RA Area:     20.60 cm LA Vol (A2C):   93.3 ml 41.89 ml/m  RA Volume:   56.30 ml  25.28 ml/m LA Vol (A4C):   64.2 ml 28.83 ml/m LA Biplane Vol: 80.0 ml 35.92 ml/m  AORTIC VALVE LVOT Vmax:   72.50 cm/s LVOT Vmean:  51.100 cm/s LVOT VTI:    0.169 m  AORTA Ao Root diam: 3.30 cm Ao Asc diam:  3.30 cm MITRAL VALVE               TRICUSPID VALVE MV Area (PHT): 4.74 cm    TR Peak grad:   33.6 mmHg MV Decel Time: 160 msec    TR Vmax:        290.00 cm/s MR Peak grad: 50.7 mmHg MR Mean grad: 37.0 mmHg    SHUNTS MR Vmax:      356.00 cm/s  Systemic VTI:  0.17 m MR Vmean:     292.0 cm/s    Systemic Diam: 2.10 cm MV E velocity: 92.10 cm/s MV A velocity: 33.00 cm/s MV E/A ratio:  2.79 Vina Gull MD Electronically signed by Vina Gull MD Signature Date/Time: 02/26/2024/2:44:56 PM    Final    US  EKG SITE RITE Result Date: 02/26/2024 If Site Rite image not attached, placement could not be confirmed due to current cardiac rhythm.  CT ABDOMEN PELVIS WO CONTRAST Result Date: 02/25/2024 CLINICAL DATA:  Acute renal failure EXAM: CT ABDOMEN AND PELVIS WITHOUT CONTRAST TECHNIQUE: Multidetector CT imaging of the abdomen and pelvis was performed following the standard protocol without IV contrast. RADIATION DOSE REDUCTION: This exam was performed according to the departmental dose-optimization program which includes automated exposure control, adjustment of the mA and/or kV according to patient size and/or use of iterative reconstruction technique. COMPARISON:  None Available. FINDINGS: Lower chest: There is a small to moderate-sized right pleural effusion. There is a small amount of airspace disease in the right lower lobe. Hepatobiliary: There is questionable mild gallbladder wall edema/thickening. No calcified gallstones are seen. There is no biliary ductal dilatation. The liver is within normal limits. Pancreas: Unremarkable. No pancreatic ductal dilatation or surrounding inflammatory changes. Spleen: Normal in size without focal abnormality. Adrenals/Urinary Tract: There is mild fat stranding surrounding the bladder. There is a subcentimeter rounded hypodensity in the  right kidney which is too small to characterize, probably a cyst. There is no hydronephrosis or urinary tract calculus. The adrenal glands are within normal limits. Stomach/Bowel: Stomach is within normal limits. Appendix appears normal. No evidence of bowel wall thickening, distention, or inflammatory changes. There is sigmoid colon diverticulosis. Vascular/Lymphatic: Aortic atherosclerosis. No enlarged abdominal or pelvic lymph nodes.  Reproductive: Prostate gland is mildly enlarged. Other: There is a small amount of ascites and interloop fluid. There is diffuse body wall edema. Musculoskeletal: No fracture is seen. IMPRESSION: 1. Small to moderate-sized right pleural effusion. 2. Small amount of ascites. 3. Diffuse body wall edema. 4. Questionable mild gallbladder wall edema/thickening. No calcified gallstones are seen. If there is concern for cholecystitis, recommend further evaluation with ultrasound. 5. Mild fat stranding surrounding the bladder. Correlate clinically for cystitis. 6. Sigmoid colon diverticulosis. 7. Aortic atherosclerosis. Aortic Atherosclerosis (ICD10-I70.0). Electronically Signed   By: Greig Pique M.D.   On: 02/25/2024 20:46   DG Chest 1 View Result Date: 02/25/2024 CLINICAL DATA:  Shortness of breath and bilateral lower extremity swelling. EXAM: CHEST  1 VIEW COMPARISON:  Aug 16, 2022 FINDINGS: There is stable multi lead AICD positioning. Multiple sternal wires are present. The cardiac silhouette is mildly enlarged and unchanged in size. Low lung volumes are noted with mild atelectatic changes suspected within the bilateral lower extremities. No pleural effusion or pneumothorax is identified. The visualized skeletal structures are unremarkable. IMPRESSION: 1. Evidence of prior median sternotomy/CABG. 2. Stable cardiomegaly with low lung volumes and mild bibasilar atelectasis. Electronically Signed   By: Suzen Dials M.D.   On: 02/25/2024 15:41   Nocturnal polysomnography Result Date: 02/09/2024 Chalice Saunas, MD     02/20/2024  6:58 PM  [facility] POLYSOMNOGRAPHY  INTERPRETATION REPORT STUDY DATE:  02/09/2024  PATIENT NAME:  [name]        DATE OF BIRTH:  09-18-1955 PATIENT ID:  [id number]    TYPE OF STUDY:  PSG SLEEP  PHYSICIAN: SAUNAS CHALICE, MD REFERRED BY: Dr Shepard, MD  SCORING TECHNICIAN: Delon Sprung, RPSGT HISTORY:  This 68 year-old Male patient of Dr  Shepard was seen on 01-03-2024 and has a chief complaint of sleep hypoxia, insomnia, non restorative sleep / defibrillator is present.  Past medical history  : see tech note. ADDITIONAL INFORMATION:  The Epworth Sleepiness Scale endorsed at at  12 /24 points (scores above or equal to 10 are suggestive of hypersomnolence). FSS endorsed at  29 /63 points. GDS at 2/ 15 points.  Height: 72 in Weight: 206 lb (BMI 27) Neck Size: 20 in MEDICATIONS: Xanax  sleep aid , Coreg , Fenofibrate , Flonase , Lasix , Zestril , Magnesium  Oxide, Metformin , Abilify, Nitrostat , Crestor , Betapace , Aldactone  TECHNICAL DESCRIPTION: A registered sleep technologist ( RPSGT)  was in attendance for the duration of the recording.  Data collection, scoring, video monitoring, and reporting were performed in compliance with the AASM Manual for the Scoring of Sleep and Associated Events; (Hypopnea is scored based on the criteria listed in Section VIII D. 1b in the AASM Manual V2.6 using a 4% oxygen desaturation rule or Hypopnea is scored based on the criteria listed in Section VIII D. 1a in the AASM Manual V2.6 using 3% oxygen desaturation and /or arousal rule). SLEEP CONTINUITY AND SLEEP ARCHITECTURE:  Lights-out was at 20:46: and lights-on at  06:00:, with  9.2 hours of recording time . Total sleep time ( TST) was 262.5 minutes with a decreased sleep efficiency at 47.4%. There was only  4.8%  REM sleep. Total supine REM sleep time was 00 minutes (0% of total REM sleep).  Sleep latency was increased at 64.0 minutes.  REM sleep latency was increased at 333.5 minutes. Of the total sleep time, the percentage of stage N1 sleep was 4.8%, stage N2 sleep was 90%, stage N3 sleep was 0.0%, and REM sleep was 4.8%.  There were 1 Stage R periods observed on this study night, 23 awakenings (i.e. transitions to Stage W from any sleep stage), and 69 total stage transitions. Wake after sleep onset (WASO) time accounted for 227 minutes(!!) . BODY POSITION:  TST was divided   between the following sleep positions: 0.0% supine;  100.0% lateral;  0% prone. Duration of total sleep and percent of total sleep in their respective position is as follows: supine 00 minutes (0%), non-supine 263 minutes (100%); right 262 minutes (100%), left 00 minutes (0%), and prone 00 minutes (0%). RESPIRATORY MONITORING:  Based on CMS criteria (using a 4% oxygen desaturation rule for scoring hypopneas), there  was 1 obstructive apnea ; 0 central; 0 mixed), and 121 hypopneas. The   Apnea index was 0.2/h . Hypopnea index was 27.7/h . The AHI ( apnea-hypopnea index)  was 27.9/h  overall (0.0 supine, 28/h  non-supine; 9.6 REM,  28.8 /h NREM).  There were 0 respiratory effort-related arousals (RERAs).   OXIMETRY: Oxyhemoglobin Saturation Nadir during sleep was at  60% from a mean of 86%.  Total sleep time (TST)  in hypoxemia (=<88%) was 239.0 minutes, or 91.0% of total sleep time. (!!) LIMB MOVEMENTS: There were 0 periodic limb movements of sleep (0.0/hr), of which 0 (0.0/hr) were associated with an arousal. AROUSAL: There were 14 arousals/hour.  Of these, 29 were identified as respiratory-related arousals (7 /h), 0 were PLM-related arousals (0 /h), and 31 were non-specific arousals (7 /h) EEG:  PSG EEG was of normal amplitude and frequency, with symmetric manifestation of sleep stages. EKG: The electrocardiogram documented a paced rhythm - The average heart rate during sleep was 61 bpm.  The heart rate during sleep varied between a minimum of 30 bpm  and  a maximum of  72 bpm. AUDIO and VIDEO:  one bathroom visit,  moderate snoring. IMPRESSION: 1) This overall moderate - severe sleep apnea has been accompanied by severe and sustained hypoxia throughout the night. Many central hypopneas were noted,  accumulating in NREM sleep.  2) Periodic limb movements were not noted. 2) Sleep efficiency was extremely poor , in spite of sleep aid being provided. Sleep fragmentation  was noted- The majority of sleep arousals was  related to hypopnea and hypoxia, Total sleep time was reduced at 262.5 minutes.  Sleep efficiency was decreased at 47.4%.      RECOMMENDATIONS: Return ASAP for in lab titration to CPAP/ BiPAP or ASV- and with expressed order to use oxygen supplementation as soon as needed. The goal is to provide the patient with a more restful and restorative sleep. I will write for Xanax  as a sleep aid once more, please remind the patient to take this medication po upon arrival in the sleep lab bedroom. CARMEN DOHMEIER,  MD      EKG:AV paced, 72 bpm (personally reviewed)  TELEMETRY: AV paced, no evidence of NSVT (personally reviewed)  DEVICE HISTORY:  Medtronic CRT-D implanted Aug 21, 2011. LV nonfunctioning, programmed off. Aug 15, 2022 had gen change with new LV lead placed (old LV lead abandoned).   AAD history: Sotalol  2017, discontinued 2018 with recurrent VT, started Amiodarone .  Amiodarone  discontinued summer 2018 due to concern of long term side effects, restarted April 2020, stopped summer 2020 due to tremors and GI upset. Sotalol  restarted May 2024 with recurrent VT and continued until this admission.   Assessment/Plan:  Ventricular tachycardia s/p CRT-D Patient with history of recurrent VT, previously intolerant of Amiodarone . He has been on daily Sotalol  since May 2024 with minimal VT burden (most recent device check showed 1 episode of VT treated with ATP in July of this year). During current admission for acute HF, patient with AKI on CKD resulting in Sotalol  being held. Creatinine has improved but remains elevated above baseline at 2.85, precluding resumption of Sotalol . Given that patient was already on renal dose Sotalol , with current AKI, he is a poor longterm Sotalol  candidate. Discussed alternative options with patient including AAD alternatives. With no recent VT, Dr. Waddell recommends continuing with Toprol  XL, titrating dose up as able and deferring AAD. We will follow up with him in clinic in  a month. Could consider Mexiletine in the future if patient has more frequent VT.  Acute on chronic HFrEF Ischemic cardiomyopathy Patient admitted with acute HF exacerbation. LVEF 30%, unchanged from prior evaluation. Significant diuresis this admission and patient now appears euvolemic/feels back to baseline.  Continue GDMT per general cardiology team. Limited by AKI on CKD With patient off Sotalol , Toprol  XL 25 mg started. As above, titrate up as able.   CAD S/P CABG No anginal symptoms this admission. Start ASA.   AKI on CKD Patient with baseline creatinine ~1.5 had AKI this admission with creatine up to 4.02. Improved to 2.85 today. General cardiology team continuing to hold some GDMT.   Hyperlipidemia On Repatha .    For questions or updates, please contact [facility] Please consult [email or web address] for contact info under     Signed, Artist Pouch, PA-C  02/29/2024, 9:33 AM     EP Attending  Patient seen and examined. He is referred for evaluation of AA drug therapy. The patient is a pleasant 68 yo man with a h/o chronic systolic heart failure, ICM and VT who is s/p ICD and s/p remote ablation. He was admitted with acute on chronic systolic heart failure and has developed progressive renal dysfunction and his Sotalol  was discontinued due to a creatine peaking at 4. He is down today however. He has had ATP therapy on sotalol  but no shocks. He had been on amikodarone remotely. No ICD shocks in several years. On exam he is a pleasant 68 yo man , NAD. Lungs are clear. CV with a RRR and ext are warm. Tele demonstrates NSR.  A/P VT - this is currently quiet. Sotalol  is relatively contraindicated and I would recommend using a beta blocker alone for AA drug therapy. If he has more VT and renal function improves, sotalol  could be reconsidered. If he developed refractory VT, then amiodarone  would be recommended.  CAD - he denies anginal symptoms.  Acute on chronic systolic heart  failure - he appears clinically improved. Would start low dose toprol  and titrate up as his bp and HR allow.   Danelle Lavina Resor,MD

## 2024-02-29 NOTE — Progress Notes (Addendum)
 Progress Note  Patient Name: [name] Date of Encounter: 02/29/2024  Primary Cardiologist: Colton Jordan, MD   Subjective   Feeling well today. Hoping to leave soon.  Inpatient Medications    Scheduled Meds:  Chlorhexidine  Gluconate Cloth  6 each Topical Daily   [START ON 03/01/2024] vitamin B-12  1,000 mcg Oral Daily   escitalopram   10 mg Oral QHS   folic acid   1 mg Oral Daily   furosemide   60 mg Intravenous BID   magnesium  chloride  1 tablet Oral Daily   metoprolol  succinate  25 mg Oral Daily   pantoprazole   40 mg Oral Daily   [START ON 03/02/2024] rosuvastatin   20 mg Oral Q Sun   saccharomyces boulardii  250 mg Oral BID   sodium chloride  flush  10-40 mL Intracatheter Q12H   Continuous Infusions:   PRN Meds: ALPRAZolam , bisacodyl , fluticasone , loperamide , nitroGLYCERIN , polyethylene glycol, sodium chloride  flush   Vital Signs    Vitals:   02/29/24 0033 02/29/24 0258 02/29/24 0346 02/29/24 0726  BP: (!) 102/58 104/62  (!) 97/43  Pulse: 65 69 63 67  Resp: 18 18  17   Temp: 97.9 F (36.6 C) (!) 97.5 F (36.4 C)  (!) 97.5 F (36.4 C)  TempSrc: Oral Oral  Oral  SpO2: 94% 95% 94% 96%  Weight:   93.4 kg   Height:        Intake/Output Summary (Last 24 hours) at 02/29/2024 1201 Last data filed at 02/29/2024 0700 Gross per 24 hour  Intake 356.13 ml  Output 750 ml  Net -393.87 ml    Filed Weights   02/27/24 0600 02/28/24 0442 02/29/24 0346  Weight: 97.3 kg 94.5 kg 93.4 kg    Telemetry    paced rhythm with multiple runs of nonsustained VT this morning - Personally Reviewed  ECG    AV dual paced rhythm - Personally Reviewed  Physical Exam   Physical Exam Vitals and nursing note reviewed.  Constitutional:      Appearance: Normal appearance.  HENT:     Head: Normocephalic and atraumatic.  Eyes:     Conjunctiva/sclera: Conjunctivae normal.  Cardiovascular:     Rate and Rhythm: Normal rate and regular rhythm.  Pulmonary:     Effort:  Pulmonary effort is normal.     Breath sounds: Normal breath sounds.  Musculoskeletal:     Comments: 1+ bilateral lower extremity edema  Skin:    Coloration: Skin is not jaundiced or pale.  Neurological:     Mental Status: He is alert.      Labs    Chemistry Recent Labs  Lab 02/25/24 1417 02/26/24 0305 02/27/24 0600 02/28/24 0827 02/29/24 0259  NA 135 137 140 138 138  K 4.7 4.0 3.9 3.4* 3.7  CL 101 104 102 103 101  CO2 19* 20* 23 21* 26  GLUCOSE 93 93 111* 229* 166*  BUN 88* 93* 86* 77* 74*  CREATININE 4.02* 3.74* 3.40* 2.85* 2.85*  CALCIUM  9.1 9.1 9.6 9.6 9.8  PROT 6.6 6.2*  --   --   --   ALBUMIN  3.3* 3.2*  --   --   --   AST 34 29  --   --   --   ALT 21 18  --   --   --   ALKPHOS 43 35*  --   --   --   BILITOT 1.1 1.7*  --   --   --   GFRNONAA 16* 17* 19*  23* 23*  ANIONGAP 15 13 15 14 11      Hematology Recent Labs  Lab 02/27/24 0640 02/28/24 0827 02/29/24 0259  WBC 4.3 4.7 6.1  RBC 3.04* 3.09* 3.34*  HGB 10.5* 10.7* 11.2*  HCT 31.0* 31.8* 34.0*  MCV 102.0* 102.9* 101.8*  MCH 34.5* 34.6* 33.5  MCHC 33.9 33.6 32.9  RDW 14.1 14.2 14.1  PLT 148* 150 179    Cardiac EnzymesNo results for input(s): TROPONINI in the last 168 hours. No results for input(s): TROPIPOC in the last 168 hours.   BNP Recent Labs  Lab 02/25/24 1417  BNP 656.7*     DDimer No results for input(s): DDIMER in the last 168 hours.   Radiology    No results found.   Cardiac Studies     Patient Profile     [name] is a 68 y.o. male with a hx of CAD s/p CABG in 2006, HFrEF with CRT-D and device change in 2024, VT with prior shocks and currently on sotalol , hyperlipidemia, hemochromatosis, RBBB, , type 2 DM and and OSA whom cardiology was consulted on 02/25/2024 for the evaluation of HF exacerbation at the request of Colton Later DO. A PICC line was placed for COOX and CVP monitoring. He has done well with a lasix  drip and is now on bolus lasix . Near  euvolemia. Likely DC 11/22.  Assessment & Plan   Acute on chronic combined systolic and diastolic heart failure with CRT-D - significantly improved but still mildly volume overloaded. PICC discontinued. Now on lasix  60 IV BID. Echo with EF 30% and global hypokinesis and unchanged. Continue IV lasix  today, hopefully change to PO tomorrow and can likely discharge tomorrow. Strict I/O and daily weights and sodium restriction. GDMT limited due to renal dysfunction and BP. Home coreg  changed to toprol  XL as patient had more ectopy off of sotalol .  Ischemic cardiomyopathy Elevated troponin secondary to demand ischemia from heart failure AKI - cardio renal syndrome, stable but above baseline - continued diuresis as above and holding nephrotoxic agents VT history A/P ablation in 2017 with CRT-D in place and history of shocks with NSVT this morning - holding sotalol  dosing in the setting of AKI. Currently on Toprol  XL 25 mg. EP consulted, appreciate recommendations Hyperlipidemia with statin intolerance on repatha  at home CAD A/P CABG in 2006 - continue aspirin  Type II diabetes   Time spent coordinating care: 42 minutes  For questions or updates, please contact [facility] Please consult [email or web address] for contact info under        Signed, Colton Calender, DO 02/29/2024, 12:01 PM

## 2024-03-01 ENCOUNTER — Other Ambulatory Visit (HOSPITAL_COMMUNITY): Payer: Self-pay

## 2024-03-01 DIAGNOSIS — I5023 Acute on chronic systolic (congestive) heart failure: Secondary | ICD-10-CM | POA: Diagnosis not present

## 2024-03-01 LAB — CBC
HCT: 30.8 % — ABNORMAL LOW (ref 39.0–52.0)
Hemoglobin: 10.3 g/dL — ABNORMAL LOW (ref 13.0–17.0)
MCH: 33.7 pg (ref 26.0–34.0)
MCHC: 33.4 g/dL (ref 30.0–36.0)
MCV: 100.7 fL — ABNORMAL HIGH (ref 80.0–100.0)
Platelets: 165 K/uL (ref 150–400)
RBC: 3.06 MIL/uL — ABNORMAL LOW (ref 4.22–5.81)
RDW: 14.1 % (ref 11.5–15.5)
WBC: 6.4 K/uL (ref 4.0–10.5)
nRBC: 0 % (ref 0.0–0.2)

## 2024-03-01 LAB — MAGNESIUM: Magnesium: 1.7 mg/dL (ref 1.7–2.4)

## 2024-03-01 LAB — BASIC METABOLIC PANEL WITH GFR
Anion gap: 16 — ABNORMAL HIGH (ref 5–15)
BUN: 62 mg/dL — ABNORMAL HIGH (ref 8–23)
CO2: 23 mmol/L (ref 22–32)
Calcium: 9.5 mg/dL (ref 8.9–10.3)
Chloride: 102 mmol/L (ref 98–111)
Creatinine, Ser: 2.6 mg/dL — ABNORMAL HIGH (ref 0.61–1.24)
GFR, Estimated: 26 mL/min — ABNORMAL LOW (ref >60)
Glucose, Bld: 113 mg/dL — ABNORMAL HIGH (ref 70–99)
Potassium: 3.5 mmol/L (ref 3.5–5.1)
Sodium: 141 mmol/L (ref 135–145)

## 2024-03-01 LAB — LACTOFERRIN, FECAL, QUALITATIVE: Lactoferrin, Fecal, Qual: POSITIVE — AB

## 2024-03-01 MED ORDER — FOLIC ACID 1 MG PO TABS
1.0000 mg | ORAL_TABLET | Freq: Every day | ORAL | 0 refills | Status: AC
  Filled 2024-03-01: qty 30, 30d supply, fill #0

## 2024-03-01 MED ORDER — FUROSEMIDE 40 MG PO TABS
40.0000 mg | ORAL_TABLET | Freq: Every day | ORAL | 3 refills | Status: DC
  Filled 2024-03-01: qty 90, 90d supply, fill #0

## 2024-03-01 MED ORDER — MAGNESIUM CHLORIDE 64 MG PO TBEC
1.0000 | DELAYED_RELEASE_TABLET | Freq: Every day | ORAL | 0 refills | Status: AC
  Filled 2024-03-01: qty 60, 60d supply, fill #0

## 2024-03-01 MED ORDER — PANTOPRAZOLE SODIUM 40 MG PO TBEC
40.0000 mg | DELAYED_RELEASE_TABLET | Freq: Every day | ORAL | 0 refills | Status: AC
  Filled 2024-03-01: qty 30, 30d supply, fill #0

## 2024-03-01 MED ORDER — CYANOCOBALAMIN 1000 MCG PO TABS
1000.0000 ug | ORAL_TABLET | Freq: Every day | ORAL | 0 refills | Status: AC
  Filled 2024-03-01: qty 30, 30d supply, fill #0

## 2024-03-01 MED ORDER — METOPROLOL SUCCINATE ER 25 MG PO TB24
25.0000 mg | ORAL_TABLET | Freq: Every day | ORAL | 0 refills | Status: DC
  Filled 2024-03-01: qty 30, 30d supply, fill #0

## 2024-03-01 NOTE — Plan of Care (Signed)
  Problem: Education: Goal: Ability to describe self-care measures that may prevent or decrease complications (Diabetes Survival Skills Education) will improve Outcome: Adequate for Discharge Goal: Individualized Educational Video(s) Outcome: Adequate for Discharge   Problem: Coping: Goal: Ability to adjust to condition or change in health will improve Outcome: Adequate for Discharge   Problem: Fluid Volume: Goal: Ability to maintain a balanced intake and output will improve Outcome: Adequate for Discharge   Problem: Health Behavior/Discharge Planning: Goal: Ability to identify and utilize available resources and services will improve Outcome: Adequate for Discharge Goal: Ability to manage health-related needs will improve Outcome: Adequate for Discharge   Problem: Metabolic: Goal: Ability to maintain appropriate glucose levels will improve Outcome: Adequate for Discharge   Problem: Nutritional: Goal: Maintenance of adequate nutrition will improve Outcome: Adequate for Discharge Goal: Progress toward achieving an optimal weight will improve Outcome: Adequate for Discharge   Problem: Skin Integrity: Goal: Risk for impaired skin integrity will decrease Outcome: Adequate for Discharge   Problem: Tissue Perfusion: Goal: Adequacy of tissue perfusion will improve Outcome: Adequate for Discharge   Problem: Education: Goal: Knowledge of General Education information will improve Description: Including pain rating scale, medication(s)/side effects and non-pharmacologic comfort measures Outcome: Adequate for Discharge   Problem: Health Behavior/Discharge Planning: Goal: Ability to manage health-related needs will improve Outcome: Adequate for Discharge   Problem: Clinical Measurements: Goal: Ability to maintain clinical measurements within normal limits will improve Outcome: Adequate for Discharge Goal: Will remain free from infection Outcome: Adequate for Discharge Goal:  Diagnostic test results will improve Outcome: Adequate for Discharge Goal: Respiratory complications will improve Outcome: Adequate for Discharge Goal: Cardiovascular complication will be avoided Outcome: Adequate for Discharge   Problem: Activity: Goal: Risk for activity intolerance will decrease Outcome: Adequate for Discharge   Problem: Nutrition: Goal: Adequate nutrition will be maintained Outcome: Adequate for Discharge   Problem: Coping: Goal: Level of anxiety will decrease Outcome: Adequate for Discharge   Problem: Elimination: Goal: Will not experience complications related to bowel motility Outcome: Adequate for Discharge Goal: Will not experience complications related to urinary retention Outcome: Adequate for Discharge   Problem: Pain Managment: Goal: General experience of comfort will improve and/or be controlled Outcome: Adequate for Discharge   Problem: Safety: Goal: Ability to remain free from injury will improve Outcome: Adequate for Discharge   Problem: Skin Integrity: Goal: Risk for impaired skin integrity will decrease Outcome: Adequate for Discharge   Problem: Education: Goal: Ability to demonstrate management of disease process will improve Outcome: Adequate for Discharge Goal: Ability to verbalize understanding of medication therapies will improve Outcome: Adequate for Discharge Goal: Individualized Educational Video(s) Outcome: Adequate for Discharge   Problem: Activity: Goal: Capacity to carry out activities will improve Outcome: Adequate for Discharge   Problem: Cardiac: Goal: Ability to achieve and maintain adequate cardiopulmonary perfusion will improve Outcome: Adequate for Discharge

## 2024-03-01 NOTE — Care Management (Addendum)
 Reached out to Jermaine with [employer] to niotify of DC and request home nocturnal oxygen delivery   Spoke to Jermaine and he said he would get it set up today. Patient ok for DC from [facility]

## 2024-03-01 NOTE — Discharge Summary (Signed)
 Physician Discharge Summary   Patient: [name] MRN: [id number] DOB: October 02, 1955 (68)  Admit date:     02/25/2024  Discharge date: 03/01/24  Discharge Physician: Yetta Blanch  PCP: Shepard Ade, MD  Recommendations at discharge: Follow-up with PCP in 1 week. Discussed options availability for SGLT2 inhibitors. Continue nocturnal oximetry.  Outpatient CPAP titration. Follow-up with cardiology as recommended. CBC and BMP in 1 week. Follow-up with nephrology as scheduled. Repeat B12 in 1 month.  May need GI consultation if B12 remains low and has ongoing diarrhea.   Follow-up Information     Shepard Ade, MD. Call in 1 week(s).   Specialty: Internal Medicine Why: call to make hospital follow up apt Contact information: [address] [place] [address] [phone]         [facility] (DME) Follow up.   Specialty: DME Services Why: Nocturnal oxygen Contact information: [address] [employer] [place]  [phone]        [facility]. Go in 5 day(s).   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   Assessment and Plan: Acute on chronic HFrEF. Management per cardiology. Treated with IV Lasix , now on oral Lasix . Cardiology consult appreciated.   AKI on CKD 4. Does not see nephrology on a regular basis.  Has seen Dr. Melia from  [facility] in the past. Baseline creatinine 2.  On admission serum creatinine 4.  Likely cardiorenal hemodynamics.  Improving with diuresis.  Monitor.   Anemia likely of chronic disease.  B12 deficiency.  Relatively folic acid  deficiency. No recent hemoglobin available.   Hemoglobin dropping down from 11.0-10.2.  But now remaining stable. B12 low.  Iron level stable.  Folic acid  low.  Replacing. Hemoccult was positive although less likely active bleeding based on presentation..   Chronic diarrhea. Does not see GI.  No significant red flags including bleeding, weight loss. Etiology not clear but possible combination of medication as well as undiagnosed colitis.  Concern for malabsorption in the setting of B12 deficiency. C. difficile ruled out. Continue Imodium  as needed. Outpatient GI referral recommended if B12 levels remain low after 1 month supplementation.   Sleep apnea. Class I obesity. Body mass index is 27.93 kg/m.  Patient presents with progressively worsening shortness of breath. Had a recent sleep study which is moderate to severe sleep apnea. Has hypoxia. Recommend oxygen at nighttime. Will arrange for discharge.   HLD. Does not take statin. Holding fibrate Managed with Zetia . Repatha  was cost prohibitive. Would recommend to discuss with cardiology again outpatient about resuming Repatha .   Type 2 diabetes mellitus well-controlled without long-term insulin  use with CKD. On metformin . Currently holding.  Likely cause of his diarrhea. Sliding scale insulin .   History of recurrent VT SP ablation 2017 and CRT-D in place on Betapace . Prior to admission on Betapace .   EP consulted.  Due to CKD limiting the use of Betapace . Initiating  Toprol -XL they will follow-up in the clinic.  May consider mexiletine in the future. Maintain K more than 4 mag more than 2.   Mood disorder. Recently started on  Lexapro . Monitor.  Hypokalemia. Hypomagnesemia. Replaced Consultants:  Cardiology  Procedures performed:  Echocardiogram  DISCHARGE MEDICATION: Allergies as of 03/01/2024       Reactions   Entresto  [sacubitril -valsartan ] Other (See Comments)   Hypotension, increased heart rate, dizziness, blurry vision   Linagliptin-metformin  Hcl    chest pain, GI upset   Amphotericin B Lipid Complex [amphotericin B]    MYALGIAS   Vytorin [ezetimibe -simvastatin]    MYALGIAS   Evolocumab      Elevated BS   Statins Other (See Comments)   Myalgias        Medication List     STOP taking these medications    carvedilol  25 MG tablet Commonly known as: COREG    fenofibrate  160 MG tablet   lisinopril  10 MG tablet Commonly known as: ZESTRIL    magnesium  oxide 400 MG tablet Commonly known as: MAG-OX   metFORMIN  500 MG tablet Commonly known as: GLUCOPHAGE    sotalol  160 MG tablet Commonly known as: BETAPACE        TAKE these medications    ALPRAZolam  0.5 MG tablet Commonly known as: XANAX  Take 1 tablet (0.5 mg total) by mouth at bedtime as needed. What changed: Another medication with the same name was removed. Continue taking this medication, and follow the directions you see here.   aspirin  81 MG tablet Take 81 mg by mouth daily.   cyanocobalamin  1000 MCG tablet Commonly known as: VITAMIN B12 Take 1 tablet (1,000 mcg total) by mouth daily. Start taking on: March 02, 2024   escitalopram  10 MG tablet Commonly known as: LEXAPRO  Take 10 mg by mouth at bedtime.   fluticasone  50 MCG/ACT nasal spray Commonly known as: FLONASE  Place 1 spray into both nostrils daily as needed for allergies or rhinitis.   folic acid  1 MG tablet Commonly known as: FOLVITE  Take 1 tablet (1 mg total) by mouth daily. Start taking on: March 02, 2024   furosemide  40 MG tablet Commonly known as: LASIX  Take 1 tablet (40 mg total) by mouth daily. Take additional 40 mg for weight gain of 3  lbs in 1 day or 5 lbs in 1 week or leg swelling. What changed: additional instructions   magnesium  chloride 64 MG Tbec SR tablet Commonly known as: SLOW-MAG Take 1 tablet (64 mg total) by mouth daily. Start taking on: March 02, 2024   metoprolol  succinate 25 MG 24 hr tablet Commonly known as: TOPROL -XL Take 1 tablet (25 mg total) by mouth daily. Start taking on: March 02, 2024   Nitrostat  0.4 MG SL tablet Generic drug: nitroGLYCERIN  PLACE 1 TABLET UNDER THE TONGUE EVERY 5 MINUTES AS NEEDED FOR CHEST PAIN FOR UP TO 3 DOSES.   OneTouch Verio test strip Generic drug: glucose blood 1 each by Other route as needed for other.   pantoprazole  40 MG tablet Commonly known as: PROTONIX  Take 1 tablet (40 mg total) by mouth daily. Start taking on: March 02, 2024   PROBIOTIC PO Take 1 tablet by mouth at bedtime.   rosuvastatin  20 MG tablet Commonly known as: CRESTOR  Take 1 tablet by mouth every Sunday.   spironolactone  25 MG tablet Commonly known as: ALDACTONE  Take 0.5 tablets (12.5 mg total) by mouth daily. Pt needs to schedule appt with provider for further refills - 1st attempt  Durable Medical Equipment  (From admission, onward)           Start     Ordered   02/27/24 1441  For home use only DME oxygen  Once       Question Answer Comment  Length of Need Lifetime   Mode or (Route) Nasal cannula   Liters per Minute 2   Frequency Only at night (stationary unit needed)   Oxygen delivery system: Gas      02/27/24 1440           Disposition: Home Diet recommendation: Cardiac diet  Discharge Exam: Vitals:   02/29/24 1952 03/01/24 0009 03/01/24 0401 03/01/24 0755  BP: (!) 101/52 (!) 103/51 (!) 102/58 (!) 106/55  Pulse: 72 64 74   Resp: 16 17 18 18   Temp: 98.5 F (36.9 C) 98.4 F (36.9 C) 98.1 F (36.7 C) 98.2 F (36.8 C)  TempSrc: Oral Oral Oral Oral  SpO2: 97% 95% 94%   Weight:   92.2 kg   Height:       General: in Mild  distress, No Rash Cardiovascular: S1 and S2 Present, No Murmur Respiratory: Good respiratory effort, Bilateral Air entry present. No Crackles, No wheezes Abdomen: Bowel Sound present, No tenderness Extremities: No edema Neuro: Alert and oriented x3, no new focal deficit   Filed Weights   02/28/24 0442 02/29/24 0346 03/01/24 0401  Weight: 94.5 kg 93.4 kg 92.2 kg   Condition at discharge: stable  The results of significant diagnostics from this hospitalization (including imaging, microbiology, ancillary and laboratory) are listed below for reference.   Imaging Studies: ECHOCARDIOGRAM COMPLETE Result Date: 02/26/2024    ECHOCARDIOGRAM REPORT   Patient Name:   SHAWON DENZER Date of Exam: 02/26/2024 Medical Rec #:  [id number]        Height:       72.0 in Accession #:    [id number]       Weight:       222.0 lb Date of Birth:  06/04/1955       BSA:          2.227 m Patient Age:    57 years         BP:           114/58 mmHg Patient Gender: M                HR:           64 bpm. Exam Location:  Inpatient Procedure: 2D Echo, Cardiac Doppler, Color Doppler and Intracardiac            Opacification Agent (Both Spectral and Color Flow Doppler were            utilized during procedure). Indications:    CHF- acute systolic 428.21/150.21  History:        Patient has prior history of Echocardiogram examinations, most                 recent 04/12/2021. CAD, Defibrillator, PAD,                 Arrythmias:Tachycardia; Risk Factors:Hypertension, Diabetes and                 Former Smoker.  Sonographer:    Merlynn Argyle Referring Phys: [phone] EKTA V Oda Lansdowne IMPRESSIONS  1. Left ventricular ejection fraction, by estimation, is 30%. The left ventricle has moderately decreased function. The left ventricle demonstrates global hypokinesis. The left ventricular internal cavity size was severely  dilated. Left ventricular diastolic parameters are indeterminate.  2. Right ventricular systolic function is normal. The right  ventricular size is normal. There is moderately elevated pulmonary artery systolic pressure.  3. Left atrial size was mildly dilated.  4. The mitral valve is normal in structure. Mild mitral valve regurgitation.  5. The aortic valve is tricuspid. Aortic valve regurgitation is not visualized.  6. The inferior vena cava is dilated in size with <50% respiratory variability, suggesting right atrial pressure of 15 mmHg. Comparison(s): The left ventricular function is unchanged. FINDINGS  Left Ventricle: Left ventricular ejection fraction, by estimation, is 30%. The left ventricle has moderately decreased function. The left ventricle demonstrates global hypokinesis. Definity  contrast agent was given IV to delineate the left ventricular endocardial borders. The left ventricular internal cavity size was severely dilated. There is no left ventricular hypertrophy. Left ventricular diastolic parameters are indeterminate. Right Ventricle: The right ventricular size is normal. Right vetricular wall thickness was not assessed. Right ventricular systolic function is normal. There is moderately elevated pulmonary artery systolic pressure. The tricuspid regurgitant velocity is  2.90 m/s, and with an assumed right atrial pressure of 15 mmHg, the estimated right ventricular systolic pressure is 48.6 mmHg. Left Atrium: Left atrial size was mildly dilated. Right Atrium: Right atrial size was normal in size. Pericardium: There is no evidence of pericardial effusion. Mitral Valve: The mitral valve is normal in structure. Mild mitral valve regurgitation. Tricuspid Valve: The tricuspid valve is normal in structure. Tricuspid valve regurgitation is mild. Aortic Valve: The aortic valve is tricuspid. Aortic valve regurgitation is not visualized. Pulmonic Valve: The pulmonic valve was normal in structure. Pulmonic valve regurgitation is trivial. Aorta: The aortic root and ascending aorta are structurally normal, with no evidence of dilitation.  Venous: The inferior vena cava is dilated in size with less than 50% respiratory variability, suggesting right atrial pressure of 15 mmHg. IAS/Shunts: No atrial level shunt detected by color flow Doppler.  LEFT VENTRICLE PLAX 2D LVIDd:         6.50 cm      Diastology LVIDs:         5.00 cm      LV e' medial:    7.18 cm/s LV PW:         1.00 cm      LV E/e' medial:  12.8 LV IVS:        1.00 cm      LV e' lateral:   8.50 cm/s LVOT diam:     2.10 cm      LV E/e' lateral: 10.8 LV SV:         59 LV SV Index:   26 LVOT Area:     3.46 cm LV IVRT:       77 msec  LV Volumes (MOD) LV vol d, MOD A2C: 165.0 ml LV vol d, MOD A4C: 175.0 ml LV vol s, MOD A2C: 106.0 ml LV vol s, MOD A4C: 118.0 ml LV SV MOD A2C:     59.0 ml LV SV MOD A4C:     175.0 ml LV SV MOD BP:      61.3 ml RIGHT VENTRICLE            IVC RV Basal diam:  4.50 cm    IVC diam: 2.30 cm RV Mid diam:    4.10 cm RV S prime:     7.97 cm/s  PULMONARY VEINS TAPSE (M-mode): 1.0 cm     Diastolic Velocity: 18.50 cm/s  S/D Velocity:       1.80                            Systolic Velocity:  32.50 cm/s LEFT ATRIUM             Index        RIGHT ATRIUM           Index LA diam:        5.40 cm 2.42 cm/m   RA Area:     20.60 cm LA Vol (A2C):   93.3 ml 41.89 ml/m  RA Volume:   56.30 ml  25.28 ml/m LA Vol (A4C):   64.2 ml 28.83 ml/m LA Biplane Vol: 80.0 ml 35.92 ml/m  AORTIC VALVE LVOT Vmax:   72.50 cm/s LVOT Vmean:  51.100 cm/s LVOT VTI:    0.169 m  AORTA Ao Root diam: 3.30 cm Ao Asc diam:  3.30 cm MITRAL VALVE               TRICUSPID VALVE MV Area (PHT): 4.74 cm    TR Peak grad:   33.6 mmHg MV Decel Time: 160 msec    TR Vmax:        290.00 cm/s MR Peak grad: 50.7 mmHg MR Mean grad: 37.0 mmHg    SHUNTS MR Vmax:      356.00 cm/s  Systemic VTI:  0.17 m MR Vmean:     292.0 cm/s   Systemic Diam: 2.10 cm MV E velocity: 92.10 cm/s MV A velocity: 33.00 cm/s MV E/A ratio:  2.79 Vina Gull MD Electronically signed by Vina Gull MD Signature Date/Time:  02/26/2024/2:44:56 PM    Final    US  EKG SITE RITE Result Date: 02/26/2024 If Site Rite image not attached, placement could not be confirmed due to current cardiac rhythm.  CT ABDOMEN PELVIS WO CONTRAST Result Date: 02/25/2024 CLINICAL DATA:  Acute renal failure EXAM: CT ABDOMEN AND PELVIS WITHOUT CONTRAST TECHNIQUE: Multidetector CT imaging of the abdomen and pelvis was performed following the standard protocol without IV contrast. RADIATION DOSE REDUCTION: This exam was performed according to the departmental dose-optimization program which includes automated exposure control, adjustment of the mA and/or kV according to patient size and/or use of iterative reconstruction technique. COMPARISON:  None Available. FINDINGS: Lower chest: There is a small to moderate-sized right pleural effusion. There is a small amount of airspace disease in the right lower lobe. Hepatobiliary: There is questionable mild gallbladder wall edema/thickening. No calcified gallstones are seen. There is no biliary ductal dilatation. The liver is within normal limits. Pancreas: Unremarkable. No pancreatic ductal dilatation or surrounding inflammatory changes. Spleen: Normal in size without focal abnormality. Adrenals/Urinary Tract: There is mild fat stranding surrounding the bladder. There is a subcentimeter rounded hypodensity in the right kidney which is too small to characterize, probably a cyst. There is no hydronephrosis or urinary tract calculus. The adrenal glands are within normal limits. Stomach/Bowel: Stomach is within normal limits. Appendix appears normal. No evidence of bowel wall thickening, distention, or inflammatory changes. There is sigmoid colon diverticulosis. Vascular/Lymphatic: Aortic atherosclerosis. No enlarged abdominal or pelvic lymph nodes. Reproductive: Prostate gland is mildly enlarged. Other: There is a small amount of ascites and interloop fluid. There is diffuse body wall edema. Musculoskeletal: No  fracture is seen. IMPRESSION: 1. Small to moderate-sized right pleural effusion. 2. Small amount of ascites. 3. Diffuse body wall edema. 4. Questionable mild gallbladder wall edema/thickening. No calcified  gallstones are seen. If there is concern for cholecystitis, recommend further evaluation with ultrasound. 5. Mild fat stranding surrounding the bladder. Correlate clinically for cystitis. 6. Sigmoid colon diverticulosis. 7. Aortic atherosclerosis. Aortic Atherosclerosis (ICD10-I70.0). Electronically Signed   By: Greig Pique M.D.   On: 02/25/2024 20:46   DG Chest 1 View Result Date: 02/25/2024 CLINICAL DATA:  Shortness of breath and bilateral lower extremity swelling. EXAM: CHEST  1 VIEW COMPARISON:  Aug 16, 2022 FINDINGS: There is stable multi lead AICD positioning. Multiple sternal wires are present. The cardiac silhouette is mildly enlarged and unchanged in size. Low lung volumes are noted with mild atelectatic changes suspected within the bilateral lower extremities. No pleural effusion or pneumothorax is identified. The visualized skeletal structures are unremarkable. IMPRESSION: 1. Evidence of prior median sternotomy/CABG. 2. Stable cardiomegaly with low lung volumes and mild bibasilar atelectasis. Electronically Signed   By: Suzen Dials M.D.   On: 02/25/2024 15:41   Nocturnal polysomnography Result Date: 02/09/2024 Chalice Saunas, MD     02/20/2024  6:58 PM  [facility] POLYSOMNOGRAPHY  INTERPRETATION REPORT STUDY DATE:  02/09/2024  PATIENT NAME:  Tabari Lucking        DATE OF BIRTH:  June 06, 1955 PATIENT ID:  [id number]    TYPE OF STUDY:  PSG SLEEP  PHYSICIAN: SAUNAS CHALICE, MD REFERRED BY: Dr Shepard, MD  SCORING TECHNICIAN: Delon Sprung, RPSGT HISTORY:  This 69 year-old Male patient of Dr Shepard was seen on 01-03-2024 and has a chief complaint of sleep hypoxia, insomnia, non restorative sleep / defibrillator is present.  Past medical history  : see  tech note. ADDITIONAL INFORMATION:  The Epworth Sleepiness Scale endorsed at at  12 /24 points (scores above or equal to 10 are suggestive of hypersomnolence). FSS endorsed at  29 /63 points. GDS at 2/ 15 points.  Height: 72 in Weight: 206 lb (BMI 27) Neck Size: 20 in MEDICATIONS: Xanax  sleep aid , Coreg , Fenofibrate , Flonase , Lasix , Zestril , Magnesium  Oxide, Metformin , Abilify, Nitrostat , Crestor , Betapace , Aldactone  TECHNICAL DESCRIPTION: A registered sleep technologist ( RPSGT)  was in attendance for the duration of the recording.  Data collection, scoring, video monitoring, and reporting were performed in compliance with the AASM Manual for the Scoring of Sleep and Associated Events; (Hypopnea is scored based on the criteria listed in Section VIII D. 1b in the AASM Manual V2.6 using a 4% oxygen desaturation rule or Hypopnea is scored based on the criteria listed in Section VIII D. 1a in the AASM Manual V2.6 using 3% oxygen desaturation and /or arousal rule). SLEEP CONTINUITY AND SLEEP ARCHITECTURE:  Lights-out was at 20:46: and lights-on at  06:00:, with  9.2 hours of recording time . Total sleep time ( TST) was 262.5 minutes with a decreased sleep efficiency at 47.4%. There was only  4.8% REM sleep. Total supine REM sleep time was 00 minutes (0% of total REM sleep).  Sleep latency was increased at 64.0 minutes.  REM sleep latency was increased at 333.5 minutes. Of the total sleep time, the percentage of stage N1 sleep was 4.8%, stage N2 sleep was 90%, stage N3 sleep was 0.0%, and REM sleep was 4.8%.  There were 1 Stage R periods observed on this study night, 23 awakenings (i.e. transitions to Stage W from any sleep stage), and 69 total stage transitions. Wake after sleep onset (WASO) time accounted for 227 minutes(!!) . BODY POSITION:  TST was divided  between the following sleep positions: 0.0% supine;  100.0%  lateral;  0% prone. Duration of total sleep and percent of total sleep in their respective position  is as follows: supine 00 minutes (0%), non-supine 263 minutes (100%); right 262 minutes (100%), left 00 minutes (0%), and prone 00 minutes (0%). RESPIRATORY MONITORING:  Based on CMS criteria (using a 4% oxygen desaturation rule for scoring hypopneas), there  was 1 obstructive apnea ; 0 central; 0 mixed), and 121 hypopneas. The   Apnea index was 0.2/h . Hypopnea index was 27.7/h . The AHI ( apnea-hypopnea index)  was 27.9/h  overall (0.0 supine, 28/h  non-supine; 9.6 REM,  28.8 /h NREM).  There were 0 respiratory effort-related arousals (RERAs).   OXIMETRY: Oxyhemoglobin Saturation Nadir during sleep was at  60% from a mean of 86%.  Total sleep time (TST)  in hypoxemia (=<88%) was 239.0 minutes, or 91.0% of total sleep time. (!!) LIMB MOVEMENTS: There were 0 periodic limb movements of sleep (0.0/hr), of which 0 (0.0/hr) were associated with an arousal. AROUSAL: There were 14 arousals/hour.  Of these, 29 were identified as respiratory-related arousals (7 /h), 0 were PLM-related arousals (0 /h), and 31 were non-specific arousals (7 /h) EEG:  PSG EEG was of normal amplitude and frequency, with symmetric manifestation of sleep stages. EKG: The electrocardiogram documented a paced rhythm - The average heart rate during sleep was 61 bpm.  The heart rate during sleep varied between a minimum of 30 bpm  and  a maximum of  72 bpm. AUDIO and VIDEO:  one bathroom visit,  moderate snoring. IMPRESSION: 1) This overall moderate - severe sleep apnea has been accompanied by severe and sustained hypoxia throughout the night. Many central hypopneas were noted,  accumulating in NREM sleep.  2) Periodic limb movements were not noted. 2) Sleep efficiency was extremely poor , in spite of sleep aid being provided. Sleep fragmentation  was noted- The majority of sleep arousals was related to hypopnea and hypoxia, Total sleep time was reduced at 262.5 minutes.  Sleep efficiency was decreased at 47.4%.      RECOMMENDATIONS: Return ASAP for  in lab titration to CPAP/ BiPAP or ASV- and with expressed order to use oxygen supplementation as soon as needed. The goal is to provide the patient with a more restful and restorative sleep. I will write for Xanax  as a sleep aid once more, please remind the patient to take this medication po upon arrival in the sleep lab bedroom. DEDRA GORES,  MD      Microbiology: Results for orders placed or performed during the hospital encounter of 02/25/24  C Difficile Quick Screen w PCR reflex     Status: None   Collection Time: 02/26/24  3:15 PM   Specimen: Per Rectum; Stool  Result Value Ref Range Status   C Diff antigen NEGATIVE NEGATIVE Final   C Diff toxin NEGATIVE NEGATIVE Final   C Diff interpretation No C. difficile detected.  Final    Comment: Performed at [facility], [address], [place], [place] [address]   Labs: CBC: Recent Labs  Lab 02/25/24 1417 02/26/24 0305 02/27/24 0640 02/28/24 0827 02/29/24 0259 03/01/24 0258  WBC 4.8 4.1 4.3 4.7 6.1 6.4  NEUTROABS 3.5  --   --   --   --   --   HGB 11.2* 10.3* 10.5* 10.7* 11.2* 10.3*  HCT 34.9* 31.0* 31.0* 31.8* 34.0* 30.8*  MCV 107.7* 103.3* 102.0* 102.9* 101.8* 100.7*  PLT 158 154 148* 150 179 165   Basic Metabolic  Panel: Recent Labs  Lab 02/26/24 0305 02/27/24 0600 02/28/24 0827 02/29/24 0259 03/01/24 0258  NA 137 140 138 138 141  K 4.0 3.9 3.4* 3.7 3.5  CL 104 102 103 101 102  CO2 20* 23 21* 26 23  GLUCOSE 93 111* 229* 166* 113*  BUN 93* 86* 77* 74* 62*  CREATININE 3.74* 3.40* 2.85* 2.85* 2.60*  CALCIUM  9.1 9.6 9.6 9.8 9.5  MG 2.2 1.9 1.5* 2.3 1.7   Liver Function Tests: Recent Labs  Lab 02/25/24 1417 02/26/24 0305  AST 34 29  ALT 21 18  ALKPHOS 43 35*  BILITOT 1.1 1.7*  PROT 6.6 6.2*  ALBUMIN  3.3* 3.2*   CBG: No results for input(s): GLUCAP in the last 168 hours.  Discharge time spent: 35 minutes  Author: Yetta Blanch, MD  [facility] 03/01/2024

## 2024-03-01 NOTE — Plan of Care (Signed)

## 2024-03-01 NOTE — Progress Notes (Signed)
 Progress Note  Patient Name: [name] Date of Encounter: 03/01/2024  Primary Cardiologist: Peter Jordan, MD   Subjective   Feeling well today. Minimal right foot swelling otherwise nearly to baseline.   Inpatient Medications    Scheduled Meds:  Chlorhexidine  Gluconate Cloth  6 each Topical Daily   vitamin B-12  1,000 mcg Oral Daily   escitalopram   10 mg Oral QHS   folic acid   1 mg Oral Daily   furosemide   60 mg Intravenous BID   magnesium  chloride  1 tablet Oral Daily   metoprolol  succinate  25 mg Oral Daily   pantoprazole   40 mg Oral Daily   [START ON 03/02/2024] rosuvastatin   20 mg Oral Q Sun   saccharomyces boulardii  250 mg Oral BID   simethicone   80 mg Oral QID   sodium chloride  flush  10-40 mL Intracatheter Q12H   Continuous Infusions:   PRN Meds: ALPRAZolam , bisacodyl , fluticasone , loperamide , nitroGLYCERIN , polyethylene glycol, sodium chloride  flush   Vital Signs    Vitals:   02/29/24 1952 03/01/24 0009 03/01/24 0401 03/01/24 0755  BP: (!) 101/52 (!) 103/51 (!) 102/58 (!) 106/55  Pulse: 72 64 74   Resp: 16 17 18 18   Temp: 98.5 F (36.9 C) 98.4 F (36.9 C) 98.1 F (36.7 C) 98.2 F (36.8 C)  TempSrc: Oral Oral Oral Oral  SpO2: 97% 95% 94%   Weight:   92.2 kg   Height:        Intake/Output Summary (Last 24 hours) at 03/01/2024 0848 Last data filed at 02/29/2024 1317 Gross per 24 hour  Intake 0 ml  Output --  Net 0 ml    Filed Weights   02/28/24 0442 02/29/24 0346 03/01/24 0401  Weight: 94.5 kg 93.4 kg 92.2 kg    Telemetry    paced rhythm with one 7 beat run of nonsustained VT this morning - Personally Reviewed  ECG    AV dual paced rhythm - Personally Reviewed  Physical Exam   GEN: No acute distress.   Neck: No JVD Cardiac: RRR, no murmurs, rubs, or gallops.  Respiratory: Clear to auscultation bilaterally. GI: Soft, nontender, non-distended  MS: No edema; No deformity. Neuro:  Nonfocal  Psych: Normal affect    Labs     Chemistry Recent Labs  Lab 02/25/24 1417 02/26/24 0305 02/27/24 0600 02/28/24 0827 02/29/24 0259 03/01/24 0258  NA 135 137   < > 138 138 141  K 4.7 4.0   < > 3.4* 3.7 3.5  CL 101 104   < > 103 101 102  CO2 19* 20*   < > 21* 26 23  GLUCOSE 93 93   < > 229* 166* 113*  BUN 88* 93*   < > 77* 74* 62*  CREATININE 4.02* 3.74*   < > 2.85* 2.85* 2.60*  CALCIUM  9.1 9.1   < > 9.6 9.8 9.5  PROT 6.6 6.2*  --   --   --   --   ALBUMIN  3.3* 3.2*  --   --   --   --   AST 34 29  --   --   --   --   ALT 21 18  --   --   --   --   ALKPHOS 43 35*  --   --   --   --   BILITOT 1.1 1.7*  --   --   --   --   GFRNONAA 16* 17*   < >  23* 23* 26*  ANIONGAP 15 13   < > 14 11 16*   < > = values in this interval not displayed.     Hematology Recent Labs  Lab 02/28/24 0827 02/29/24 0259 03/01/24 0258  WBC 4.7 6.1 6.4  RBC 3.09* 3.34* 3.06*  HGB 10.7* 11.2* 10.3*  HCT 31.8* 34.0* 30.8*  MCV 102.9* 101.8* 100.7*  MCH 34.6* 33.5 33.7  MCHC 33.6 32.9 33.4  RDW 14.2 14.1 14.1  PLT 150 179 165    Cardiac EnzymesNo results for input(s): TROPONINI in the last 168 hours. No results for input(s): TROPIPOC in the last 168 hours.   BNP Recent Labs  Lab 02/25/24 1417  BNP 656.7*     DDimer No results for input(s): DDIMER in the last 168 hours.   Radiology    No results found.   Cardiac Studies     Patient Profile     [name] is a 68 y.o. male with a hx of CAD s/p CABG in 2006, HFrEF with CRT-D and device change in 2024, VT with prior shocks and currently on sotalol , hyperlipidemia, hemochromatosis, RBBB, , type 2 DM and and OSA whom cardiology was consulted on 02/25/2024 for the evaluation of HF exacerbation at the request of [name]. A PICC line was placed for COOX and CVP monitoring. He has done well with a lasix  drip and is now on bolus lasix . Near euvolemia. Likely DC 11/22.  Assessment & Plan   Acute on chronic combined systolic and diastolic heart  failure with CRT-D  - significantly improved but still mildly volume overloaded. PICC discontinued. Echo with EF 30% and global hypokinesis and unchanged.  - Strict I/O and daily weights and sodium restriction. GDMT limited due to renal dysfunction and BP. -Home coreg  changed to toprol  XL as patient had more ectopy off of sotalol .  - ok to receive lasix  60 mg IV x 1 this am then d/c.  - consider transition to home lasix  40 mg daily tomorrow. Pt had a difficult time on lasix  80 mg daily in past. We discussed renal dosing of lasix  for effectiveness, he can consider taking an additional lasix  40 mg if ineffective home diuresis, and I've instructed him to call the office if assistance needed. However, in shared decision making, he feels 40 mg daily will be most successful for him.   Ischemic cardiomyopathy Elevated troponin secondary to demand ischemia from heart failure AKI - cardio renal syndrome, stable but above baseline - continued diuresis as above and holding nephrotoxic agents VT history A/P ablation in 2017 with CRT-D in place and history of shocks with NSVT this morning - EP recommends discontinuing sotalol  given poor long term candidacy. Titrate Toprol  XL as needed. Will be seen in EP clinic 1 mo. Reconsider sotalol  if recurrent VT and renal function permits sotalol . Otherwise, amiodarone .  Hyperlipidemia with statin intolerance on repatha  at home CAD A/P CABG in 2006 - continue aspirin  Type II diabetes    Appears stable for hospital dc from CV perspective today. D/w primary team and primary RN.    For questions or updates, please contact [facility] Please consult [email or web address] for contact info under        Soyla DELENA Merck, MD 9:09 AM 03/01/24

## 2024-03-05 ENCOUNTER — Ambulatory Visit (HOSPITAL_COMMUNITY)

## 2024-03-10 ENCOUNTER — Telehealth (HOSPITAL_COMMUNITY): Payer: Self-pay

## 2024-03-10 NOTE — Telephone Encounter (Signed)
 Called to confirm/remind patient of their appointment at the [facility] on 02/10/24 2:00.   Appointment:   [] Confirmed  [x] Left mess   [] No answer/No voice mail  [] VM Full/unable to leave message  [] Phone not in service  Patient reminded to bring all medications and/or complete list.  Confirmed patient has transportation. Gave directions, instructed to utilize valet parking.

## 2024-03-11 ENCOUNTER — Telehealth: Payer: Self-pay

## 2024-03-11 ENCOUNTER — Other Ambulatory Visit (HOSPITAL_COMMUNITY): Payer: Self-pay

## 2024-03-11 ENCOUNTER — Ambulatory Visit (HOSPITAL_COMMUNITY): Admit: 2024-03-11 | Discharge: 2024-03-11 | Disposition: A | Attending: Physician Assistant

## 2024-03-11 ENCOUNTER — Encounter (HOSPITAL_COMMUNITY): Payer: Self-pay

## 2024-03-11 VITALS — BP 122/70 | HR 92 | Ht 72.0 in | Wt 201.2 lb

## 2024-03-11 DIAGNOSIS — F1910 Other psychoactive substance abuse, uncomplicated: Secondary | ICD-10-CM | POA: Insufficient documentation

## 2024-03-11 DIAGNOSIS — I2510 Atherosclerotic heart disease of native coronary artery without angina pectoris: Secondary | ICD-10-CM | POA: Insufficient documentation

## 2024-03-11 DIAGNOSIS — Z95810 Presence of automatic (implantable) cardiac defibrillator: Secondary | ICD-10-CM | POA: Insufficient documentation

## 2024-03-11 DIAGNOSIS — G4730 Sleep apnea, unspecified: Secondary | ICD-10-CM | POA: Insufficient documentation

## 2024-03-11 DIAGNOSIS — I472 Ventricular tachycardia, unspecified: Secondary | ICD-10-CM | POA: Insufficient documentation

## 2024-03-11 DIAGNOSIS — I959 Hypotension, unspecified: Secondary | ICD-10-CM | POA: Insufficient documentation

## 2024-03-11 DIAGNOSIS — I878 Other specified disorders of veins: Secondary | ICD-10-CM | POA: Insufficient documentation

## 2024-03-11 DIAGNOSIS — I5020 Unspecified systolic (congestive) heart failure: Secondary | ICD-10-CM | POA: Insufficient documentation

## 2024-03-11 DIAGNOSIS — Z951 Presence of aortocoronary bypass graft: Secondary | ICD-10-CM | POA: Insufficient documentation

## 2024-03-11 DIAGNOSIS — N184 Chronic kidney disease, stage 4 (severe): Secondary | ICD-10-CM | POA: Insufficient documentation

## 2024-03-11 DIAGNOSIS — I5022 Chronic systolic (congestive) heart failure: Secondary | ICD-10-CM

## 2024-03-11 DIAGNOSIS — R9431 Abnormal electrocardiogram [ECG] [EKG]: Secondary | ICD-10-CM | POA: Insufficient documentation

## 2024-03-11 DIAGNOSIS — R197 Diarrhea, unspecified: Secondary | ICD-10-CM | POA: Insufficient documentation

## 2024-03-11 DIAGNOSIS — E1122 Type 2 diabetes mellitus with diabetic chronic kidney disease: Secondary | ICD-10-CM | POA: Insufficient documentation

## 2024-03-11 DIAGNOSIS — N179 Acute kidney failure, unspecified: Secondary | ICD-10-CM | POA: Insufficient documentation

## 2024-03-11 DIAGNOSIS — I4892 Unspecified atrial flutter: Secondary | ICD-10-CM | POA: Diagnosis not present

## 2024-03-11 DIAGNOSIS — I255 Ischemic cardiomyopathy: Secondary | ICD-10-CM

## 2024-03-11 DIAGNOSIS — R140 Abdominal distension (gaseous): Secondary | ICD-10-CM | POA: Insufficient documentation

## 2024-03-11 DIAGNOSIS — E785 Hyperlipidemia, unspecified: Secondary | ICD-10-CM | POA: Insufficient documentation

## 2024-03-11 DIAGNOSIS — Z87891 Personal history of nicotine dependence: Secondary | ICD-10-CM | POA: Insufficient documentation

## 2024-03-11 DIAGNOSIS — E1165 Type 2 diabetes mellitus with hyperglycemia: Secondary | ICD-10-CM | POA: Insufficient documentation

## 2024-03-11 DIAGNOSIS — I252 Old myocardial infarction: Secondary | ICD-10-CM | POA: Insufficient documentation

## 2024-03-11 DIAGNOSIS — Z79899 Other long term (current) drug therapy: Secondary | ICD-10-CM | POA: Insufficient documentation

## 2024-03-11 DIAGNOSIS — F1010 Alcohol abuse, uncomplicated: Secondary | ICD-10-CM | POA: Insufficient documentation

## 2024-03-11 DIAGNOSIS — Z7901 Long term (current) use of anticoagulants: Secondary | ICD-10-CM | POA: Insufficient documentation

## 2024-03-11 LAB — COMPREHENSIVE METABOLIC PANEL WITH GFR
ALT: 28 U/L (ref 0–44)
AST: 40 U/L (ref 15–41)
Albumin: 3.5 g/dL (ref 3.5–5.0)
Alkaline Phosphatase: 59 U/L (ref 38–126)
Anion gap: 10 (ref 5–15)
BUN: 27 mg/dL — ABNORMAL HIGH (ref 8–23)
CO2: 25 mmol/L (ref 22–32)
Calcium: 10 mg/dL (ref 8.9–10.3)
Chloride: 101 mmol/L (ref 98–111)
Creatinine, Ser: 1.68 mg/dL — ABNORMAL HIGH (ref 0.61–1.24)
GFR, Estimated: 44 mL/min — ABNORMAL LOW (ref >60)
Glucose, Bld: 130 mg/dL — ABNORMAL HIGH (ref 70–99)
Potassium: 3.4 mmol/L — ABNORMAL LOW (ref 3.5–5.1)
Sodium: 136 mmol/L (ref 135–145)
Total Bilirubin: 1.4 mg/dL — ABNORMAL HIGH (ref 0.0–1.2)
Total Protein: 7.1 g/dL (ref 6.5–8.1)

## 2024-03-11 LAB — CBC
HCT: 37.8 % — ABNORMAL LOW (ref 39.0–52.0)
Hemoglobin: 12.7 g/dL — ABNORMAL LOW (ref 13.0–17.0)
MCH: 34.1 pg — ABNORMAL HIGH (ref 26.0–34.0)
MCHC: 33.6 g/dL (ref 30.0–36.0)
MCV: 101.6 fL — ABNORMAL HIGH (ref 80.0–100.0)
Platelets: 189 K/uL (ref 150–400)
RBC: 3.72 MIL/uL — ABNORMAL LOW (ref 4.22–5.81)
RDW: 13.4 % (ref 11.5–15.5)
WBC: 7.3 K/uL (ref 4.0–10.5)
nRBC: 0 % (ref 0.0–0.2)

## 2024-03-11 LAB — BRAIN NATRIURETIC PEPTIDE: B Natriuretic Peptide: 1057.1 pg/mL — ABNORMAL HIGH (ref 0.0–100.0)

## 2024-03-11 MED ORDER — APIXABAN 5 MG PO TABS: 5.0000 mg | ORAL_TABLET | Freq: Two times a day (BID) | ORAL | 11 refills | Status: DC

## 2024-03-11 NOTE — Telephone Encounter (Signed)
 Alert received from [employer] for AT/AF Daily Burden > Threshold. AF/AFL in progress from 12/2, histogram peak 100-110, hx of PAF per EPIC, no OAC - route to triage. HF diagnostics currently abnormal. 10 NSVT, HR's 158-171, 1-10sec in duration.  Attempted to contact patient. Phone goes straight to VM. Has apt today w/ HF clinic at 2:00 PM

## 2024-03-11 NOTE — Telephone Encounter (Signed)
 Spoke to pts wife who reports patient has had significant weight loss since recent hospital admission. Per wife weight loss has been 223.6 down to 195.3. Wife states patient overall has fatigue so difficult to tell if any worse.   Patient has HF apt today at 2:00 for hospital f/u apt.   Recommended AF clinic referral d/t AF and no OAC. Wife/patient are agreeable.   Wife request to call her for all scheduling apts.

## 2024-03-11 NOTE — Telephone Encounter (Signed)
 Patient seen by Manuelita Dutch PA with AHF Clinic today. AFL -New diagnosis on device today -He is asymptomatic -Start eliquis 5 mg BID. CBC today. Recent TSH okay. -Return for ECG in 1 week. If still in AFL will need to arrange TEE/DCCV.  Afib Clinic appt not needed at this time with above plan in place.

## 2024-03-11 NOTE — Telephone Encounter (Signed)
 Pt wife called back and would like a call back at her number

## 2024-03-11 NOTE — H&P (View-Only) (Signed)
 HEART & VASCULAR TRANSITION OF CARE CONSULT NOTE     Referring Physician: Dr. Tobie Love, [name], MD  Cardiologist: Dr. Jordan EP: Dr. Waddell  Chief Complaint: CHF  HPI: Referred to clinic by Dr. Tobie with [facility] for heart failure consultation.   [name] is a 68 y.o. 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 Timmy, prior ETOH and tobacco abuse. Afib is on his problem list but do not see that this has been documented by EP.  Admitted in 11/25 with acute on chronic CHF and AKI on CKD IV. Scr up to 4 (baseline ~2). He was seen by Cardiology. Diuresed with IV lasix . Lactic acid 2.0>0.8. Had PICC placed, Co-ox consistently > 60%. Home coreg  changed to toprol  XL. GDMT limited by CKD and blood pressure. Sotalol  discontinued due to renal dysfunction. PTA lisinopril  and spironolactone  also discontinued. Echo during admit with EF 30%, LVIDd 6.5 cm, RV okay, RVSP 48 mmHg, mild MR, dilated IVC.  Device clinic received alert today regarding AF/AFL in progress starting this am. OptiVol began trending up in 10/25, markedly elevated > 200 since early November but thoracic impedance now trending back up to baseline. 10 NSVT episodes.  He is here today for post-hospital CHF follow-up. He is accompanied by his wife who assists with the history. He lost about 20 lb in the hospital and down another 10 lb since discharge home. Most recent home weight was 195 lb. Activity primarily limited by dyspnea with exertion. Gets short of breath climbing stairs or performing activity more strenuous than ordinary walking. Cannot keep up with his wife in the grocery store. Lower extremity edema and abdominal bloating has significantly improved over last week. Taking all  medications as prescribed.   Past Medical History:  Diagnosis Date   AICD (automatic cardioverter/defibrillator) present    Anemia    Arthritis    mild in my joints & knuckles (05/10/2016)   CHF (congestive heart failure) (HCC)    Coronary artery disease    Hemochromatosis    Hyperlipidemia    Ischemic cardiomyopathy    EF 36%; prior anterior MI, s/p CABG x 3; s/p cath Feb 2013 showing grafts to be patent with severe LV dysfunction   Myocardial infarction (HCC) 2006   PAF (paroxysmal atrial fibrillation) (HCC)    Pneumonia 2000   RBBB (right bundle branch block)    Sleep apnea    can't tolerate mask (05/10/2016)   Type II diabetes mellitus (HCC)    Ventricular tachycardia, 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 2 tablet 0   apixaban (ELIQUIS) 5 MG TABS tablet Take 1 tablet (5 mg total) by mouth 2 (two) times daily. 60 tablet 11   cyanocobalamin  1000 MCG tablet Take 1 tablet (1,000 mcg total) by mouth daily. 30 tablet 0   escitalopram  (LEXAPRO ) 10 MG tablet Take 10 mg by mouth at bedtime.     fluticasone  (FLONASE ) 50 MCG/ACT nasal spray Place 1 spray into both nostrils daily as needed for allergies or rhinitis.     folic acid  (FOLVITE ) 1 MG tablet Take 1 tablet (1 mg  total) by mouth daily. 30 tablet 0   furosemide  (LASIX ) 40 MG tablet Take 1 tablet (40 mg total) by mouth daily. Take additional 40 mg for weight gain of 3 lbs in 1 day or 5 lbs in 1 week or leg swelling. 90 tablet 3   magnesium  chloride (SLOW-MAG) 64 MG TBEC SR tablet Take 1 tablet (64 mg total) by mouth daily. 60 tablet 0   metoprolol  succinate (TOPROL -XL) 25 MG 24 hr tablet Take 1 tablet (25 mg total) by mouth daily. 30 tablet 0   NITROSTAT  0.4 MG SL tablet PLACE 1 TABLET UNDER THE TONGUE EVERY 5 MINUTES AS NEEDED FOR CHEST PAIN FOR UP TO 3 DOSES. 25 tablet 3    ONETOUCH VERIO test strip 1 each by Other route as needed for other.   6   pantoprazole  (PROTONIX ) 40 MG tablet Take 1 tablet (40 mg total) by mouth daily. 30 tablet 0   Probiotic Product (PROBIOTIC PO) Take 1 tablet by mouth at bedtime.     rosuvastatin  (CRESTOR ) 20 MG tablet Take 1 tablet by mouth every Sunday.     spironolactone  (ALDACTONE ) 25 MG tablet Take 0.5 tablets (12.5 mg total) by mouth daily. Pt needs to schedule appt with provider for further refills - 1st attempt 45 tablet 3   No current facility-administered medications for this encounter.    Allergies  Allergen Reactions   Entresto  [Sacubitril -Valsartan ] Other (See Comments)    Hypotension, increased heart rate, dizziness, blurry vision   Linagliptin-Metformin  Hcl     chest pain, GI upset   Amphotericin B Lipid Complex [Amphotericin B]     MYALGIAS   Vytorin [Ezetimibe -Simvastatin]     MYALGIAS   Evolocumab       Elevated BS   Statins Other (See Comments)    Myalgias       Social History   Socioeconomic History   Marital status: Married    Spouse name: stephaine   Number of children: 1   Years of education: Not on file   Highest education level: Not on file  Occupational History   Occupation: telephone service  Tobacco Use   Smoking status: Former    Current packs/day: 0.00    Average packs/day: 2.0 packs/day for 30.0 years (60.0 ttl pk-yrs)    Types: Cigarettes    Start date: 03/12/1975    Quit date: 03/11/2005    Years since quitting: 19.0   Smokeless tobacco: Never  Vaping Use   Vaping status: Never Used  Substance and Sexual Activity   Alcohol use: Yes    Alcohol/week: 3.0 standard drinks of alcohol    Types: 3 Cans of beer per week    Comment: socially   Drug use: No   Sexual activity: Not Currently  Other Topics Concern   Not on file  Social History Narrative   Not on file   Social Drivers of Health   Financial Resource Strain: Low Risk  (02/28/2024)   Overall Financial Resource Strain  (CARDIA)    Difficulty of Paying Living Expenses: Not very hard  Food Insecurity: No Food Insecurity (02/27/2024)   Hunger Vital Sign    Worried About Running Out of Food in the Last Year: Never true    Ran Out of Food in the Last Year: Never true  Transportation Needs: No Transportation Needs (02/28/2024)   PRAPARE - Administrator, Civil Service (Medical): No    Lack of Transportation (Non-Medical): No  Physical Activity: Not on file  Stress:  Not on file  Social Connections: Moderately Isolated (02/27/2024)   Social Connection and Isolation Panel    Frequency of Communication with Friends and Family: More than three times a week    Frequency of Social Gatherings with Friends and Family: More than three times a week    Attends Religious Services: Never    Database Administrator or Organizations: No    Attends Banker Meetings: Never    Marital Status: Married  Catering Manager Violence: Not At Risk (02/27/2024)   Humiliation, Afraid, Rape, and Kick questionnaire    Fear of Current or Ex-Partner: No    Emotionally Abused: No    Physically Abused: No    Sexually Abused: No      Family History  Adopted: Yes    Vitals:   03/11/24 1425  BP: 122/70  Pulse: 92  SpO2: 98%  Weight: 91.3 kg (201 lb 3.2 oz)  Height: 6' (1.829 m)    PHYSICAL EXAM: General:  Well appearing.Ambulated into clinic. Neck: JVP difficult d/t thick neck. Cor: Regular rhythm. No murmurs. Lungs: clear Abdomen: soft, nontender, nondistended.  Extremities: no edema Neuro: alert & oriented x 3. Affect pleasant.  ECG: V paced 99 bpm with underlying atrial flutter  [employer] device interrogation, 100% AT/AF starting today, last treated VT/VF in 07/25, 80% effective BiV pacing in setting of AFL, OptiVol markedly elevated, thoracic impedance was low but now trending back up to prior threshold   ASSESSMENT & PLAN:  HFrEF/ICM -EF has been in 25-30% range -Last echo 11/25: EF 30%,  LVIDd 6.5 cm, RV okay, RVSP 48 mmHg, mild MR, dilated IVC. -Has BiV ICD. Device interrogation shows only 80% effective BiV pacing with AFL. ECG in 1 week, if still in AFL will need TEE/DCCV.  -Concern he may be nearing end-stage especially with more recent trouble tolerating GDMT and slowly worsening renal function. Discussed with Dr. Rolan. Will arrange for RHC to be completed in next few weeks. He states he would want to consider advanced therapies such as LVAD if felt to be a candidate.  -NYHA III. Volume okay on exam. He is down 30 lb. Suspect OptiVol on device is inaccurate, thoracic impedance is near threshold. Continue lasix  40 mg daily for now. CMET, BNP today. -Continue spiro 12.5 mg daily -Continue toprol  xl 25 mg daily (may need to stop if low-output confirmed on cath) -Not on farxiga  due to cost, consider restarting in the future if we can get more affordably -Recently taken off ACE-I d/t AKI/CKD and hypotension -SBP 120 today but often 90s - low 100s at home and during recent admission  VT - Hx VT ablation in 2018 -Previously had tremor with amiodarone  -Recently stopped sotalol  d/t CKD -Last received 4 ATP within 24 hrs for recurrent VT in 07/25  AFL -New diagnosis on device today -He is asymptomatic -Start eliquis 5 mg BID. CBC today. Recent TSH okay. -Return for ECG in 1 week. If still in AFL will need to arrange TEE/DCCV.  CAD -Hx septal MI in 2006, s/p CABG X 3 -Patent bypass grafts on cath in 2018 -Stop aspirin  when starting eliquis.  -Takes 20 mg rosuvastatin  once a week. Hx mylagias with statins. Stopped PCSK-9 inhibitor in the past d/t hyperglycemia  CKD IIIb/IV -Scr slowly worsening over last couple of years, baseline appears to be ~ 2 -Recently up to 4 in setting of acute on chronic CHF. Improved to 2.6 at discharge. He states he had also been experiencing profuse diarrhea  with metformin  and magnesium  supplement (now discontinued) -Check labs today -Needs to  get reestablished with Nephrology  Moderate to severe sleep apnea -Noted on recent sleep study with Neuro -Has CPAP titration scheduled   Referred to HFSW (PCP, Medications, Transportation, ETOH Abuse, Drug Abuse, Insurance, Financial ): No Refer to Pharmacy: yes, review cost of eliquis Refer to Home Health: No Refer to [facility]: Yes Refer to General Cardiology: No, already established  Follow up  3 weeks for RHC, 4 weeks with Dr. Rolan

## 2024-03-11 NOTE — H&P (View-Only) (Signed)
 HEART & VASCULAR TRANSITION OF CARE CONSULT NOTE     Referring Physician: Dr. Tobie Weaver, [name], MD  Cardiologist: Dr. Jordan EP: Dr. Waddell  Chief Complaint: CHF  HPI: Referred to clinic by Dr. Tobie with [facility] for heart failure consultation.   [name] is a 68 y.o. 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 Timmy, prior ETOH and tobacco abuse. Afib is on his problem list but do not see that this has been documented by EP.  Admitted in 11/25 with acute on chronic CHF and AKI on CKD IV. Scr up to 4 (baseline ~2). He was seen by Cardiology. Diuresed with IV lasix . Lactic acid 2.0>0.8. Had PICC placed, Co-ox consistently > 60%. Home coreg  changed to toprol  XL. GDMT limited by CKD and blood pressure. Sotalol  discontinued due to renal dysfunction. PTA lisinopril  and spironolactone  also discontinued. Echo during admit with EF 30%, LVIDd 6.5 cm, RV okay, RVSP 48 mmHg, mild MR, dilated IVC.  Device clinic received alert today regarding AF/AFL in progress starting this am. OptiVol began trending up in 10/25, markedly elevated > 200 since early November but thoracic impedance now trending back up to baseline. 10 NSVT episodes.  He is here today for post-hospital CHF follow-up. He is accompanied by his wife who assists with the history. He lost about 20 lb in the hospital and down another 10 lb since discharge home. Most recent home weight was 195 lb. Activity primarily limited by dyspnea with exertion. Gets short of breath climbing stairs or performing activity more strenuous than ordinary walking. Cannot keep up with his wife in the grocery store. Lower extremity edema and abdominal bloating has significantly improved over last week. Taking all  medications as prescribed.   Past Medical History:  Diagnosis Date   AICD (automatic cardioverter/defibrillator) present    Anemia    Arthritis    mild in my joints & knuckles (05/10/2016)   CHF (congestive heart failure) (HCC)    Coronary artery disease    Hemochromatosis    Hyperlipidemia    Ischemic cardiomyopathy    EF 36%; prior anterior MI, s/p CABG x 3; s/p cath Feb 2013 showing grafts to be patent with severe LV dysfunction   Myocardial infarction (HCC) 2006   PAF (paroxysmal atrial fibrillation) (HCC)    Pneumonia 2000   RBBB (right bundle branch block)    Sleep apnea    can't tolerate mask (05/10/2016)   Type II diabetes mellitus (HCC)    Ventricular tachycardia, 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 2 tablet 0   apixaban (ELIQUIS) 5 MG TABS tablet Take 1 tablet (5 mg total) by mouth 2 (two) times daily. 60 tablet 11   cyanocobalamin  1000 MCG tablet Take 1 tablet (1,000 mcg total) by mouth daily. 30 tablet 0   escitalopram  (LEXAPRO ) 10 MG tablet Take 10 mg by mouth at bedtime.     fluticasone  (FLONASE ) 50 MCG/ACT nasal spray Place 1 spray into both nostrils daily as needed for allergies or rhinitis.     folic acid  (FOLVITE ) 1 MG tablet Take 1 tablet (1 mg  total) by mouth daily. 30 tablet 0   furosemide  (LASIX ) 40 MG tablet Take 1 tablet (40 mg total) by mouth daily. Take additional 40 mg for weight gain of 3 lbs in 1 day or 5 lbs in 1 week or leg swelling. 90 tablet 3   magnesium  chloride (SLOW-MAG) 64 MG TBEC SR tablet Take 1 tablet (64 mg total) by mouth daily. 60 tablet 0   metoprolol  succinate (TOPROL -XL) 25 MG 24 hr tablet Take 1 tablet (25 mg total) by mouth daily. 30 tablet 0   NITROSTAT  0.4 MG SL tablet PLACE 1 TABLET UNDER THE TONGUE EVERY 5 MINUTES AS NEEDED FOR CHEST PAIN FOR UP TO 3 DOSES. 25 tablet 3    ONETOUCH VERIO test strip 1 each by Other route as needed for other.   6   pantoprazole  (PROTONIX ) 40 MG tablet Take 1 tablet (40 mg total) by mouth daily. 30 tablet 0   Probiotic Product (PROBIOTIC PO) Take 1 tablet by mouth at bedtime.     rosuvastatin  (CRESTOR ) 20 MG tablet Take 1 tablet by mouth every Sunday.     spironolactone  (ALDACTONE ) 25 MG tablet Take 0.5 tablets (12.5 mg total) by mouth daily. Pt needs to schedule appt with provider for further refills - 1st attempt 45 tablet 3   No current facility-administered medications for this encounter.    Allergies  Allergen Reactions   Entresto  [Sacubitril -Valsartan ] Other (See Comments)    Hypotension, increased heart rate, dizziness, blurry vision   Linagliptin-Metformin  Hcl     chest pain, GI upset   Amphotericin B Lipid Complex [Amphotericin B]     MYALGIAS   Vytorin [Ezetimibe -Simvastatin]     MYALGIAS   Evolocumab       Elevated BS   Statins Other (See Comments)    Myalgias       Social History   Socioeconomic History   Marital status: Married    Spouse name: [name]   Number of children: 1   Years of education: Not on file   Highest education level: Not on file  Occupational History   Occupation: telephone service  Tobacco Use   Smoking status: Former    Current packs/day: 0.00    Average packs/day: 2.0 packs/day for 30.0 years (60.0 ttl pk-yrs)    Types: Cigarettes    Start date: 03/12/1975    Quit date: 03/11/2005    Years since quitting: 19.0   Smokeless tobacco: Never  Vaping Use   Vaping status: Never Used  Substance and Sexual Activity   Alcohol use: Yes    Alcohol/week: 3.0 standard drinks of alcohol    Types: 3 Cans of beer per week    Comment: socially   Drug use: No   Sexual activity: Not Currently  Other Topics Concern   Not on file  Social History Narrative   Not on file   Social Drivers of Health   Financial Resource Strain: Low Risk  (02/28/2024)   Overall Financial Resource Strain  (CARDIA)    Difficulty of Paying Living Expenses: Not very hard  Food Insecurity: No Food Insecurity (02/27/2024)   Hunger Vital Sign    Worried About Running Out of Food in the Last Year: Never true    Ran Out of Food in the Last Year: Never true  Transportation Needs: No Transportation Needs (02/28/2024)   PRAPARE - Administrator, Civil Service (Medical): No    Lack of Transportation (Non-Medical): No  Physical Activity: Not on file  Stress:  Not on file  Social Connections: Moderately Isolated (02/27/2024)   Social Connection and Isolation Panel    Frequency of Communication with Friends and Family: More than three times a week    Frequency of Social Gatherings with Friends and Family: More than three times a week    Attends Religious Services: Never    Database Administrator or Organizations: No    Attends Banker Meetings: Never    Marital Status: Married  Catering Manager Violence: Not At Risk (02/27/2024)   Humiliation, Afraid, Rape, and Kick questionnaire    Fear of Current or Ex-Partner: No    Emotionally Abused: No    Physically Abused: No    Sexually Abused: No      Family History  Adopted: Yes    Vitals:   03/11/24 1425  BP: 122/70  Pulse: 92  SpO2: 98%  Weight: 91.3 kg (201 lb 3.2 oz)  Height: 6' (1.829 m)    PHYSICAL EXAM: General:  Well appearing.Ambulated into clinic. Neck: JVP difficult d/t thick neck. Cor: Regular rhythm. No murmurs. Lungs: clear Abdomen: soft, nontender, nondistended.  Extremities: no edema Neuro: alert & oriented x 3. Affect pleasant.  ECG: V paced 99 bpm with underlying atrial flutter  Medtronic device interrogation, 100% AT/AF starting today, last treated VT/VF in 07/25, 80% effective BiV pacing in setting of AFL, OptiVol markedly elevated, thoracic impedance was low but now trending back up to prior threshold   ASSESSMENT & PLAN:  HFrEF/ICM -EF has been in 25-30% range -Last echo 11/25: EF 30%,  LVIDd 6.5 cm, RV okay, RVSP 48 mmHg, mild MR, dilated IVC. -Has BiV ICD. Device interrogation shows only 80% effective BiV pacing with AFL. ECG in 1 week, if still in AFL will need TEE/DCCV.  -Concern he may be nearing end-stage especially with more recent trouble tolerating GDMT and slowly worsening renal function. Discussed with Dr. Rolan. Will arrange for RHC to be completed in next few weeks. He states he would want to consider advanced therapies such as LVAD if felt to be a candidate.  -NYHA III. Volume okay on exam. He is down 30 lb. Suspect OptiVol on device is inaccurate, thoracic impedance is near threshold. Continue lasix  40 mg daily for now. CMET, BNP today. -Continue spiro 12.5 mg daily -Continue toprol  xl 25 mg daily (may need to stop if low-output confirmed on cath) -Not on farxiga  due to cost, consider restarting in the future if we can get more affordably -Recently taken off ACE-I d/t AKI/CKD and hypotension -SBP 120 today but often 90s - low 100s at home and during recent admission  VT - Hx VT ablation in 2018 -Previously had tremor with amiodarone  -Recently stopped sotalol  d/t CKD -Last received 4 ATP within 24 hrs for recurrent VT in 07/25  AFL -New diagnosis on device today -He is asymptomatic -Start eliquis 5 mg BID. CBC today. Recent TSH okay. -Return for ECG in 1 week. If still in AFL will need to arrange TEE/DCCV.  CAD -Hx septal MI in 2006, s/p CABG X 3 -Patent bypass grafts on cath in 2018 -Stop aspirin  when starting eliquis.  -Takes 20 mg rosuvastatin  once a week. Hx mylagias with statins. Stopped PCSK-9 inhibitor in the past d/t hyperglycemia  CKD IIIb/IV -Scr slowly worsening over last couple of years, baseline appears to be ~ 2 -Recently up to 4 in setting of acute on chronic CHF. Improved to 2.6 at discharge. He states he had also been experiencing profuse diarrhea  with metformin  and magnesium  supplement (now discontinued) -Check labs today -Needs to  get reestablished with Nephrology  Moderate to severe sleep apnea -Noted on recent sleep study with Neuro -Has CPAP titration scheduled   Referred to HFSW (PCP, Medications, Transportation, ETOH Abuse, Drug Abuse, Insurance, Financial ): No Refer to Pharmacy: yes, review cost of eliquis Refer to Home Health: No Refer to Advanced Heart Failure Clinic: Yes Refer to General Cardiology: No, already established  Follow up  3 weeks for RHC, 4 weeks with Dr. Rolan

## 2024-03-11 NOTE — Patient Instructions (Addendum)
 Medication Changes:  START ELIQUIS 5MG  TWICE DAILY   STOP ASPIRIN    Lab Work:  Labs done today, your results will be available in MyChart, we will contact you for abnormal readings.  Testing/Procedures:   [facility] [address] [place] [address] Dept: [phone] Loc: [phone]  [name]  03/11/2024  You are scheduled for a Cardiac Catheterization on Wednesday, December 31 with Dr. Ezra Shuck.  1. Please arrive at the [facility] (Main Entrance A) at [facility]: [address] [place], [place] [address] at 5:30 AM (This time is 2 hour(s) before your procedure to ensure your preparation).   Free valet parking service is available. You will check in at ADMITTING. The support person will be asked to wait in the waiting room.  It is OK to have someone drop you off and come back when you are ready to be discharged.    Special note: Every effort is made to have your procedure done on time. Please understand that emergencies sometimes delay scheduled procedures.  2. Diet: NOTHING TO EAT OR DRINK AFTER MIDNIGHT THE NIGHT BEFORE   3. Hydration: Nothing to eat and drink after midnight.   4. Labs: You will need to have blood drawn on TODAY  5. Medication instructions in preparation for your procedure:  DO NOT TAKE ANY FUROSEMIDE  THE MORNING OF PROCEDURE   DO NOT TAKE ANY SPIRONOLACTONE  THE MORNING OF PROCEDURE    Contrast Allergy: No  6. Plan to go home the same day, you will only stay overnight if medically necessary. 7. Bring a current list of your medications and current insurance cards. 8. You MUST have a responsible person to drive you home. 9. Someone MUST be with you the first 24 hours after you arrive home or your discharge will be delayed. 10. Please wear clothes that are easy to get on and off and wear slip-on shoes.  Thank you for allowing us  to care for you!    -- [facility]   Follow-Up in: 1 WEEK FOR EKG AS SCHEDULED   AND THEN AGAIN IN 4 WEEKS AS SCHEDULED WITH APP CLINIC   At the [facility] Clinic, you and your health needs are our priority. We have a designated team specialized in the treatment of Heart Failure. This Care Team includes your primary Heart Failure Specialized Cardiologist (physician), Advanced Practice Providers (APPs- Physician Assistants and Nurse Practitioners), and Pharmacist who all work together to provide you with the care you need, when you need it.   You may see any of the following providers on your designated Care Team at your next follow up:  Dr. Toribio Fuel Dr. Ezra Shuck Dr. Odis Brownie Greig Mosses, NP Caffie Shed, [place] [facility] [place], [place] Beckey Coe, NP Jordan Lee, NP Tinnie Redman, PharmD   Please be sure to bring in all your medications bottles to every appointment.   Need to Contact Us :  If you have any questions or concerns before your next appointment please send us  a message through [place] or call our office at [phone].    TO LEAVE A MESSAGE FOR THE NURSE SELECT OPTION 2, PLEASE LEAVE A MESSAGE INCLUDING: YOUR NAME DATE OF BIRTH CALL BACK NUMBER REASON FOR CALL**this is important as we prioritize the call backs  YOU WILL RECEIVE A CALL BACK THE SAME DAY AS LONG AS YOU CALL BEFORE 4:00 PM

## 2024-03-11 NOTE — Progress Notes (Signed)
 HEART & VASCULAR TRANSITION OF CARE CONSULT NOTE     Referring Physician: Dr. Tobie Love, [name], MD  Cardiologist: Dr. Jordan EP: Dr. Waddell  Chief Complaint: CHF  HPI: Referred to clinic by Dr. Tobie with [facility] for heart failure consultation.   [name] is a 68 y.o. 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 Timmy, prior ETOH and tobacco abuse. Afib is on his problem list but do not see that this has been documented by EP.  Admitted in 11/25 with acute on chronic CHF and AKI on CKD IV. Scr up to 4 (baseline ~2). He was seen by Cardiology. Diuresed with IV lasix . Lactic acid 2.0>0.8. Had PICC placed, Co-ox consistently > 60%. Home coreg  changed to toprol  XL. GDMT limited by CKD and blood pressure. Sotalol  discontinued due to renal dysfunction. PTA lisinopril  and spironolactone  also discontinued. Echo during admit with EF 30%, LVIDd 6.5 cm, RV okay, RVSP 48 mmHg, mild MR, dilated IVC.  Device clinic received alert today regarding AF/AFL in progress starting this am. OptiVol began trending up in 10/25, markedly elevated > 200 since early November but thoracic impedance now trending back up to baseline. 10 NSVT episodes.  He is here today for post-hospital CHF follow-up. He is accompanied by his wife who assists with the history. He lost about 20 lb in the hospital and down another 10 lb since discharge home. Most recent home weight was 195 lb. Activity primarily limited by dyspnea with exertion. Gets short of breath climbing stairs or performing activity more strenuous than ordinary walking. Cannot keep up with his wife in the grocery store. Lower extremity edema and abdominal bloating has significantly improved over last week. Taking all  medications as prescribed.   Past Medical History:  Diagnosis Date   AICD (automatic cardioverter/defibrillator) present    Anemia    Arthritis    mild in my joints & knuckles (05/10/2016)   CHF (congestive heart failure) (HCC)    Coronary artery disease    Hemochromatosis    Hyperlipidemia    Ischemic cardiomyopathy    EF 36%; prior anterior MI, s/p CABG x 3; s/p cath Feb 2013 showing grafts to be patent with severe LV dysfunction   Myocardial infarction (HCC) 2006   PAF (paroxysmal atrial fibrillation) (HCC)    Pneumonia 2000   RBBB (right bundle branch block)    Sleep apnea    can't tolerate mask (05/10/2016)   Type II diabetes mellitus (HCC)    Ventricular tachycardia, 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 2 tablet 0   apixaban (ELIQUIS) 5 MG TABS tablet Take 1 tablet (5 mg total) by mouth 2 (two) times daily. 60 tablet 11   cyanocobalamin  1000 MCG tablet Take 1 tablet (1,000 mcg total) by mouth daily. 30 tablet 0   escitalopram  (LEXAPRO ) 10 MG tablet Take 10 mg by mouth at bedtime.     fluticasone  (FLONASE ) 50 MCG/ACT nasal spray Place 1 spray into both nostrils daily as needed for allergies or rhinitis.     folic acid  (FOLVITE ) 1 MG tablet Take 1 tablet (1 mg  total) by mouth daily. 30 tablet 0   furosemide  (LASIX ) 40 MG tablet Take 1 tablet (40 mg total) by mouth daily. Take additional 40 mg for weight gain of 3 lbs in 1 day or 5 lbs in 1 week or leg swelling. 90 tablet 3   magnesium  chloride (SLOW-MAG) 64 MG TBEC SR tablet Take 1 tablet (64 mg total) by mouth daily. 60 tablet 0   metoprolol  succinate (TOPROL -XL) 25 MG 24 hr tablet Take 1 tablet (25 mg total) by mouth daily. 30 tablet 0   NITROSTAT  0.4 MG SL tablet PLACE 1 TABLET UNDER THE TONGUE EVERY 5 MINUTES AS NEEDED FOR CHEST PAIN FOR UP TO 3 DOSES. 25 tablet 3    ONETOUCH VERIO test strip 1 each by Other route as needed for other.   6   pantoprazole  (PROTONIX ) 40 MG tablet Take 1 tablet (40 mg total) by mouth daily. 30 tablet 0   Probiotic Product (PROBIOTIC PO) Take 1 tablet by mouth at bedtime.     rosuvastatin  (CRESTOR ) 20 MG tablet Take 1 tablet by mouth every Sunday.     spironolactone  (ALDACTONE ) 25 MG tablet Take 0.5 tablets (12.5 mg total) by mouth daily. Pt needs to schedule appt with provider for further refills - 1st attempt 45 tablet 3   No current facility-administered medications for this encounter.    Allergies  Allergen Reactions   Entresto  [Sacubitril -Valsartan ] Other (See Comments)    Hypotension, increased heart rate, dizziness, blurry vision   Linagliptin-Metformin  Hcl     chest pain, GI upset   Amphotericin B Lipid Complex [Amphotericin B]     MYALGIAS   Vytorin [Ezetimibe -Simvastatin]     MYALGIAS   Evolocumab       Elevated BS   Statins Other (See Comments)    Myalgias       Social History   Socioeconomic History   Marital status: Married    Spouse name: [name]   Number of children: 1   Years of education: Not on file   Highest education level: Not on file  Occupational History   Occupation: telephone service  Tobacco Use   Smoking status: Former    Current packs/day: 0.00    Average packs/day: 2.0 packs/day for 30.0 years (60.0 ttl pk-yrs)    Types: Cigarettes    Start date: 03/12/1975    Quit date: 03/11/2005    Years since quitting: 19.0   Smokeless tobacco: Never  Vaping Use   Vaping status: Never Used  Substance and Sexual Activity   Alcohol use: Yes    Alcohol/week: 3.0 standard drinks of alcohol    Types: 3 Cans of beer per week    Comment: socially   Drug use: No   Sexual activity: Not Currently  Other Topics Concern   Not on file  Social History Narrative   Not on file   Social Drivers of Health   Financial Resource Strain: Low Risk  (02/28/2024)   Overall Financial Resource Strain  (CARDIA)    Difficulty of Paying Living Expenses: Not very hard  Food Insecurity: No Food Insecurity (02/27/2024)   Hunger Vital Sign    Worried About Running Out of Food in the Last Year: Never true    Ran Out of Food in the Last Year: Never true  Transportation Needs: No Transportation Needs (02/28/2024)   PRAPARE - Administrator, Civil Service (Medical): No    Lack of Transportation (Non-Medical): No  Physical Activity: Not on file  Stress:  Not on file  Social Connections: Moderately Isolated (02/27/2024)   Social Connection and Isolation Panel    Frequency of Communication with Friends and Family: More than three times a week    Frequency of Social Gatherings with Friends and Family: More than three times a week    Attends Religious Services: Never    Database Administrator or Organizations: No    Attends Banker Meetings: Never    Marital Status: Married  Catering Manager Violence: Not At Risk (02/27/2024)   Humiliation, Afraid, Rape, and Kick questionnaire    Fear of Current or Ex-Partner: No    Emotionally Abused: No    Physically Abused: No    Sexually Abused: No      Family History  Adopted: Yes    Vitals:   03/11/24 1425  BP: 122/70  Pulse: 92  SpO2: 98%  Weight: 91.3 kg (201 lb 3.2 oz)  Height: 6' (1.829 m)    PHYSICAL EXAM: General:  Well appearing.Ambulated into clinic. Neck: JVP difficult d/t thick neck. Cor: Regular rhythm. No murmurs. Lungs: clear Abdomen: soft, nontender, nondistended.  Extremities: no edema Neuro: alert & oriented x 3. Affect pleasant.  ECG: V paced 99 bpm with underlying atrial flutter  Medtronic device interrogation, 100% AT/AF starting today, last treated VT/VF in 07/25, 80% effective BiV pacing in setting of AFL, OptiVol markedly elevated, thoracic impedance was low but now trending back up to prior threshold   ASSESSMENT & PLAN:  HFrEF/ICM -EF has been in 25-30% range -Last echo 11/25: EF 30%,  LVIDd 6.5 cm, RV okay, RVSP 48 mmHg, mild MR, dilated IVC. -Has BiV ICD. Device interrogation shows only 80% effective BiV pacing with AFL. ECG in 1 week, if still in AFL will need TEE/DCCV.  -Concern he may be nearing end-stage especially with more recent trouble tolerating GDMT and slowly worsening renal function. Discussed with Dr. Rolan. Will arrange for RHC to be completed in next few weeks. He states he would want to consider advanced therapies such as LVAD if felt to be a candidate.  -NYHA III. Volume okay on exam. He is down 30 lb. Suspect OptiVol on device is inaccurate, thoracic impedance is near threshold. Continue lasix  40 mg daily for now. CMET, BNP today. -Continue spiro 12.5 mg daily -Continue toprol  xl 25 mg daily (may need to stop if low-output confirmed on cath) -Not on farxiga  due to cost, consider restarting in the future if we can get more affordably -Recently taken off ACE-I d/t AKI/CKD and hypotension -SBP 120 today but often 90s - low 100s at home and during recent admission  VT - Hx VT ablation in 2018 -Previously had tremor with amiodarone  -Recently stopped sotalol  d/t CKD -Last received 4 ATP within 24 hrs for recurrent VT in 07/25  AFL -New diagnosis on device today -He is asymptomatic -Start eliquis 5 mg BID. CBC today. Recent TSH okay. -Return for ECG in 1 week. If still in AFL will need to arrange TEE/DCCV.  CAD -Hx septal MI in 2006, s/p CABG X 3 -Patent bypass grafts on cath in 2018 -Stop aspirin  when starting eliquis.  -Takes 20 mg rosuvastatin  once a week. Hx mylagias with statins. Stopped PCSK-9 inhibitor in the past d/t hyperglycemia  CKD IIIb/IV -Scr slowly worsening over last couple of years, baseline appears to be ~ 2 -Recently up to 4 in setting of acute on chronic CHF. Improved to 2.6 at discharge. He states he had also been experiencing profuse diarrhea  with metformin  and magnesium  supplement (now discontinued) -Check labs today -Needs to  get reestablished with Nephrology  Moderate to severe sleep apnea -Noted on recent sleep study with Neuro -Has CPAP titration scheduled   Referred to HFSW (PCP, Medications, Transportation, ETOH Abuse, Drug Abuse, Insurance, Financial ): No Refer to Pharmacy: yes, review cost of eliquis Refer to Home Health: No Refer to Advanced Heart Failure Clinic: Yes Refer to General Cardiology: No, already established  Follow up  3 weeks for RHC, 4 weeks with Dr. Rolan

## 2024-03-12 ENCOUNTER — Ambulatory Visit (HOSPITAL_COMMUNITY): Payer: Self-pay | Admitting: Physician Assistant

## 2024-03-12 ENCOUNTER — Telehealth (HOSPITAL_COMMUNITY): Payer: Self-pay | Admitting: Cardiology

## 2024-03-12 ENCOUNTER — Other Ambulatory Visit (HOSPITAL_COMMUNITY): Payer: Self-pay

## 2024-03-12 DIAGNOSIS — I5022 Chronic systolic (congestive) heart failure: Secondary | ICD-10-CM

## 2024-03-12 MED ORDER — SPIRONOLACTONE 25 MG PO TABS: 25.0000 mg | ORAL_TABLET | Freq: Every day | ORAL | 3 refills | Status: AC

## 2024-03-12 NOTE — Telephone Encounter (Signed)
 Patient called to report medication reaction to eliquis  Reports he started with a very itchy rash all over body (stomach and chest) rash has since spread to arms and feet   Describes as being covered in poison oak Minimal relief with anti itch cream  Reports all after one dose of eliquis

## 2024-03-12 NOTE — Telephone Encounter (Signed)
 Samples of Xarelto 2.5mg  (patient will need to take 6 tablets daily with the 2.5mg  dosage)- this is okay per PharmD. These have been left at the front desk.   Quantity- 6 bottles, LOT # [id number]  EXP- 04/09/2024-- patient will take these prior to expiration date.    Called patient and educated him on this and he is aware of all instructions and verbalized understanding.   Advised patient to call back to office with any issues, questions, or concerns. Patient verbalized understanding.

## 2024-03-13 ENCOUNTER — Other Ambulatory Visit: Payer: Self-pay

## 2024-03-13 ENCOUNTER — Emergency Department (HOSPITAL_COMMUNITY)

## 2024-03-13 ENCOUNTER — Emergency Department (HOSPITAL_COMMUNITY)
Admission: EM | Admit: 2024-03-13 | Discharge: 2024-03-13 | Disposition: A | Discharge status: Home / Self Care | Attending: Emergency Medicine | Admitting: Emergency Medicine

## 2024-03-13 ENCOUNTER — Telehealth (HOSPITAL_COMMUNITY): Payer: Self-pay | Admitting: Cardiology

## 2024-03-13 ENCOUNTER — Encounter (HOSPITAL_COMMUNITY): Payer: Self-pay | Admitting: *Deleted

## 2024-03-13 DIAGNOSIS — I2510 Atherosclerotic heart disease of native coronary artery without angina pectoris: Secondary | ICD-10-CM | POA: Diagnosis not present

## 2024-03-13 DIAGNOSIS — I5089 Other heart failure: Secondary | ICD-10-CM | POA: Insufficient documentation

## 2024-03-13 DIAGNOSIS — N189 Chronic kidney disease, unspecified: Secondary | ICD-10-CM | POA: Diagnosis not present

## 2024-03-13 DIAGNOSIS — Z7901 Long term (current) use of anticoagulants: Secondary | ICD-10-CM | POA: Diagnosis not present

## 2024-03-13 DIAGNOSIS — R339 Retention of urine, unspecified: Secondary | ICD-10-CM | POA: Insufficient documentation

## 2024-03-13 DIAGNOSIS — R338 Other retention of urine: Secondary | ICD-10-CM

## 2024-03-13 LAB — URINALYSIS, ROUTINE W REFLEX MICROSCOPIC
Bilirubin Urine: NEGATIVE
Glucose, UA: NEGATIVE mg/dL
Hgb urine dipstick: NEGATIVE
Ketones, ur: NEGATIVE mg/dL
Leukocytes,Ua: NEGATIVE
Nitrite: NEGATIVE
Protein, ur: NEGATIVE mg/dL
Specific Gravity, Urine: 1.02 (ref 1.005–1.030)
pH: 6 (ref 5.0–8.0)

## 2024-03-13 LAB — CBC
HCT: 38.1 % — ABNORMAL LOW (ref 39.0–52.0)
Hemoglobin: 12.2 g/dL — ABNORMAL LOW (ref 13.0–17.0)
MCH: 33.6 pg (ref 26.0–34.0)
MCHC: 32 g/dL (ref 30.0–36.0)
MCV: 105 fL — ABNORMAL HIGH (ref 80.0–100.0)
Platelets: 180 K/uL (ref 150–400)
RBC: 3.63 MIL/uL — ABNORMAL LOW (ref 4.22–5.81)
RDW: 13.1 % (ref 11.5–15.5)
WBC: 8 K/uL (ref 4.0–10.5)
nRBC: 0 % (ref 0.0–0.2)

## 2024-03-13 LAB — BASIC METABOLIC PANEL WITH GFR
Anion gap: 10 (ref 5–15)
BUN: 29 mg/dL — ABNORMAL HIGH (ref 8–23)
CO2: 20 mmol/L — ABNORMAL LOW (ref 22–32)
Calcium: 9.3 mg/dL (ref 8.9–10.3)
Chloride: 102 mmol/L (ref 98–111)
Creatinine, Ser: 1.67 mg/dL — ABNORMAL HIGH (ref 0.61–1.24)
GFR, Estimated: 45 mL/min — ABNORMAL LOW (ref >60)
Glucose, Bld: 172 mg/dL — ABNORMAL HIGH (ref 70–99)
Potassium: 3.5 mmol/L (ref 3.5–5.1)
Sodium: 132 mmol/L — ABNORMAL LOW (ref 135–145)

## 2024-03-13 MED ORDER — IOHEXOL 350 MG/ML SOLN
75.0000 mL | Freq: Once | INTRAVENOUS | Status: AC | PRN
  Administered 2024-03-13: 75 mL via INTRAVENOUS

## 2024-03-13 MED ORDER — CEPHALEXIN 500 MG PO CAPS: 500.0000 mg | ORAL_CAPSULE | Freq: Two times a day (BID) | ORAL | 0 refills | Status: DC

## 2024-03-13 NOTE — ED Notes (Signed)
 Placed foley cath and 100cc clear yellow urine returned

## 2024-03-13 NOTE — ED Provider Triage Note (Signed)
 Emergency Medicine Provider Triage Evaluation Note  KLYDE BANKA , a 68 y.o. male  was evaluated in triage.  Pt complains of urinary retention with suprapubic pressure for the past two days. Denies nausea, vomiting, or fever. Denies any pain or swelling in his testicles, penis, or scrotum.   Review of Systems  Positive:  Negative:   Physical Exam  BP 112/65   Pulse 85   Temp 97.6 F (36.4 C)   Resp 16   SpO2 96%  Gen:   Awake, no distress   Resp:  Normal effort  MSK:   Moves extremities without difficulty  Other:  Diffuse abdominal tenderness but feels pain more in the suprapubic region.  Catheter is in place  Medical Decision Making  Medically screening exam initiated at 12:21 PM.  Appropriate orders placed.  Sonny DELENA Holts was informed that the remainder of the evaluation will be completed by another provider, this initial triage assessment does not replace that evaluation, and the importance of remaining in the ED until their evaluation is complete.  Bladder scan was not obtained before foley cath was placed.  Patient's output only 300 cc.  Bladder scan was 0.  Order CT imaging.  Patient's usual GFR is less than 30 however recent lab today shows GFR 45.  Order contrastSABRA Bernis Ernst, PA-C 03/13/24 1235

## 2024-03-13 NOTE — Telephone Encounter (Signed)
 Patient left VM on triage line with concern of patients inability to urinate. Reports at this time it only dribbles.  Would like to know if she should go to [facility] or [facility]   Chart reviewed and pt currently in ER at [facility]

## 2024-03-13 NOTE — ED Provider Notes (Signed)
 [facility] Provider Note   CSN: [id number] Arrival date & time: 03/13/24  1043     Patient presents with: Urinary Retention   [name] is a 68 y.o. male with ischemic cardiomyopathy, ventricular tachycardia, paroxysmal A-flutter, CAD, status post ICD, pulmonary CHF, OSA who presents today with 2 days of suprapubic pain and inability urinate.  Denies any fever, Chills, nausea, vomiting.  Patient states that he has been having dribbles of urine since Tuesday morning.  States that the only medications that have been changed was heart failure meds.  He also was started have many restrictions that I was later once this all already started.  It is stating that he has a history of CKD as well.  Patient states that after catheter insertion, his suprapubic pain is not present anymore.   HPI     Prior to Admission medications   Medication Sig Start Date End Date Taking? Authorizing Provider  ALPRAZolam  (XANAX ) 0.5 MG tablet Take 1 tablet (0.5 mg total) by mouth at bedtime as needed. 02/20/24   Dohmeier, Dedra, MD  apixaban (ELIQUIS) 5 MG TABS tablet Take 1 tablet (5 mg total) by mouth 2 (two) times daily. 03/11/24   Colletta Manuelita Garre, PA-C  cyanocobalamin  1000 MCG tablet Take 1 tablet (1,000 mcg total) by mouth daily. 03/02/24   Tobie Yetta HERO, MD  escitalopram  (LEXAPRO ) 10 MG tablet Take 10 mg by mouth at bedtime. 02/06/24   [provider]  fluticasone  (FLONASE ) 50 MCG/ACT nasal spray Place 1 spray into both nostrils daily as needed for allergies or rhinitis.    [provider]  folic acid  (FOLVITE ) 1 MG tablet Take 1 tablet (1 mg total) by mouth daily. 03/02/24   Tobie Yetta HERO, MD  furosemide  (LASIX ) 40 MG tablet Take 1 tablet (40 mg total) by mouth daily. Take additional 40 mg for weight gain of 3 lbs in 1 day or 5 lbs in 1 week or leg swelling. 03/01/24 02/24/25  Tobie Yetta HERO, MD  magnesium  chloride (SLOW-MAG) 64  MG TBEC SR tablet Take 1 tablet (64 mg total) by mouth daily. 03/02/24   Tobie Yetta HERO, MD  metoprolol  succinate (TOPROL -XL) 25 MG 24 hr tablet Take 1 tablet (25 mg total) by mouth daily. 03/02/24   Tobie Yetta HERO, MD  NITROSTAT  0.4 MG SL tablet PLACE 1 TABLET UNDER THE TONGUE EVERY 5 MINUTES AS NEEDED FOR CHEST PAIN FOR UP TO 3 DOSES. 04/28/15   Army Katheryn BROCKS, NP  [facility] VERIO test strip 1 each by Other route as needed for other.  09/29/15   [provider]  pantoprazole  (PROTONIX ) 40 MG tablet Take 1 tablet (40 mg total) by mouth daily. 03/02/24   Tobie Yetta HERO, MD  Probiotic Product (PROBIOTIC PO) Take 1 tablet by mouth at bedtime.    [provider]  rosuvastatin  (CRESTOR ) 20 MG tablet Take 1 tablet by mouth every Sunday.    [provider]  spironolactone  (ALDACTONE ) 25 MG tablet Take 1 tablet (25 mg total) by mouth daily. 03/12/24   Colletta Manuelita Garre, PA-C    Allergies: Entresto  [sacubitril -valsartan ], Linagliptin-metformin  hcl, Amphotericin b lipid complex [amphotericin b], Vytorin [ezetimibe -simvastatin], Evolocumab , and Statins    Review of Systems As noted in HPI Updated Vital Signs BP 116/63   Pulse 86   Temp (!) 97.5 F (36.4 C) (Oral)   Resp 17   SpO2 100%   Physical Exam Constitutional:  General: He is not in acute distress.    Appearance: He is not toxic-appearing.  Cardiovascular:     Rate and Rhythm: Normal rate.     Heart sounds: No murmur heard. Pulmonary:     Effort: Pulmonary effort is normal. No respiratory distress.  Abdominal:     General: Abdomen is flat.     Tenderness: There is no abdominal tenderness.     Comments: No Suprapubic tenderness Does seem to have mild fluid wave present  Genitourinary:    Comments: Foley bag is about 200 mL of output Musculoskeletal:     Right lower leg: No edema.     Left lower leg: No edema.  Neurological:     Mental Status: He is alert.     (all labs ordered are listed,  but only abnormal results are displayed) Labs Reviewed  URINALYSIS, ROUTINE W REFLEX MICROSCOPIC - Abnormal; Notable for the following components:      Result Value   Color, Urine AMBER (*)    All other components within normal limits  BASIC METABOLIC PANEL WITH GFR - Abnormal; Notable for the following components:   Sodium 132 (*)    CO2 20 (*)    Glucose, Bld 172 (*)    BUN 29 (*)    Creatinine, Ser 1.67 (*)    GFR, Estimated 45 (*)    All other components within normal limits  CBC - Abnormal; Notable for the following components:   RBC 3.63 (*)    Hemoglobin 12.2 (*)    HCT 38.1 (*)    MCV 105.0 (*)    All other components within normal limits    EKG: None  Radiology: CT ABDOMEN PELVIS W CONTRAST Result Date: 03/13/2024 EXAM: CT ABDOMEN AND PELVIS WITH CONTRAST 03/13/2024 02:02:00 PM TECHNIQUE: CT of the abdomen and pelvis was performed with the administration of intravenous contrast. 75 mL of iohexol (OMNIPAQUE) 350 MG/ML injection was administered. Multiplanar reformatted images are provided for review. Automated exposure control, iterative reconstruction, and/or weight-based adjustment of the mA/kV was utilized to reduce the radiation dose to as low as reasonably achievable. COMPARISON: 17 days ago. CLINICAL HISTORY: Abdominal pain, acute, nonlocalized. FINDINGS: LOWER CHEST: No acute abnormality. LIVER: The liver is unremarkable. GALLBLADDER AND BILE DUCTS: Gallbladder is unremarkable. No biliary ductal dilatation. SPLEEN: No acute abnormality. PANCREAS: No acute abnormality. ADRENAL GLANDS: No acute abnormality. KIDNEYS, URETERS AND BLADDER: No stones in the kidneys or ureters. No hydronephrosis. No perinephric or periureteral stranding. Urinary bladder is decompressed secondary to foley catheter. Mild wall thickening is noted suggestive of possible cystitis. GI AND BOWEL: Stomach demonstrates no acute abnormality. Sigmoid diverticulosis is noted without definite inflammation. Mild  distal sigmoid and rectal wall thickening is noted suggesting proctocolitis. There is no bowel obstruction. PERITONEUM AND RETROPERITONEUM: Small amount of free fluid is noted in the pelvis. No free air. VASCULATURE: Aorta is normal in caliber. Aortic atherosclerosis. LYMPH NODES: No lymphadenopathy. REPRODUCTIVE ORGANS: No acute abnormality. BONES AND SOFT TISSUES: No acute osseous abnormality. No focal soft tissue abnormality. IMPRESSION: 1. Mild distal sigmoid and rectal wall thickening, suggestive of proctocolitis. 2. Sigmoid diverticulosis without definite inflammation. 3. Mild urinary bladder wall thickening, suggestive of cystitis. Electronically signed by: Lynwood Seip MD 03/13/2024 03:17 PM EST RP Workstation: [id number]     Procedures   Medications Ordered in the ED  iohexol (OMNIPAQUE) 350 MG/ML injection 75 mL (75 mLs Intravenous Contrast Given 03/13/24 1401)  Medical Decision Making Amount and/or Complexity of Data Reviewed Labs: ordered.   Differentials include BPH, cystitis, prostatitis, urethritis medication induced, obstruction, neurological  On physical examination and patient is not in any acute distress and vital signs are stable.  Patient states that his symptoms are a lot better after Foley insertion.  Wife states that his Foley bag has not been changed since he came in.  Patient states that he feels that he does not want to have Foley when he goes home.  Did discuss either taking out the Foley and going home if patient is unable to urinate or has progressive trouble will have to come back to the ED versus is going home with Foley and following up with urology.  Patient states that he would not like to go home with Foley and would rather have it taken out and then try to follow-up with urology.  UA does not show any signs of infection, CBC is not showing leukocytosis, CMP actually shows that patient's GFR is improved from about 12 days ago and  creatinine as well.  Final plan: Patient's plan is to remove foley and return to the ED with any worsening symptoms and try to follow-up with urology.     Final diagnoses:  None    ED Discharge Orders     None          D'Mello, Elise Gladden, DO 03/13/24 1737    Emil Share, DO 03/13/24 [phone]

## 2024-03-13 NOTE — ED Triage Notes (Signed)
 Pt is here due to inability to void.  Pt states that he has unable to empty his bladder in 3 days and he states that it feels my bladder is going to explode.  No hx of the same

## 2024-03-13 NOTE — Discharge Instructions (Addendum)
 Came into the ED because you were having trouble urinating.  We were able to place a catheter and get some urine out she will feel better.  Your labs did not show any signs of infection but the scan that we did of your stomach did show some inflammation in your bladder.  Because of this we will give you some antibiotics he should take for about 7 days twice a day.  If you are still having issues urinating you can come back to the hospital.  Please follow-up with the urologist

## 2024-03-14 ENCOUNTER — Other Ambulatory Visit (HOSPITAL_COMMUNITY): Payer: Self-pay

## 2024-03-14 ENCOUNTER — Telehealth: Payer: Self-pay | Admitting: *Deleted

## 2024-03-14 ENCOUNTER — Ambulatory Visit (HOSPITAL_COMMUNITY)
Admission: RE | Admit: 2024-03-14 | Discharge: 2024-03-14 | Disposition: A | Source: Ambulatory Visit | Attending: Internal Medicine | Admitting: Internal Medicine

## 2024-03-14 VITALS — BP 112/60 | HR 87 | Ht 72.0 in | Wt 199.8 lb

## 2024-03-14 DIAGNOSIS — I4891 Unspecified atrial fibrillation: Secondary | ICD-10-CM

## 2024-03-14 DIAGNOSIS — I5020 Unspecified systolic (congestive) heart failure: Secondary | ICD-10-CM | POA: Diagnosis not present

## 2024-03-14 DIAGNOSIS — D6869 Other thrombophilia: Secondary | ICD-10-CM | POA: Diagnosis not present

## 2024-03-14 DIAGNOSIS — I4892 Unspecified atrial flutter: Secondary | ICD-10-CM

## 2024-03-14 DIAGNOSIS — I480 Paroxysmal atrial fibrillation: Secondary | ICD-10-CM

## 2024-03-14 MED ORDER — METOPROLOL SUCCINATE ER 25 MG PO TB24
25.0000 mg | ORAL_TABLET | Freq: Two times a day (BID) | ORAL | 3 refills | Status: DC
  Filled 2024-03-14: qty 60, 30d supply, fill #0

## 2024-03-14 NOTE — Telephone Encounter (Signed)
 High alert received from [employer]:  Alert remote transmission:  Total Ventricular Pacing < 90% for 7 days.  Currently 74%, possible intermittent loss of V capture on presenting AF in progress, Eliquis  per EPIC 1 VT event 12/4 @ 20:34, 20sec in duration, HR 176, EGM c/w AF with likely VT pace terminated with 1 burst of ATP HF diagnostics currently abnormal Route to triage high alert per protocol Follow up as scheduled. LA, CVRS ______________________________________________________________________  Agree with [facility] assessment of VT/VF episode that is atrially driven and c/w Afib RVR that was successfully pace terminated in ventricles with 1 round ATP.  Device treated rhythm purely based on rate as the ventricular rate surpassed the VT 1 zone detection  Optivol readings appear to have fallen back to baseline  Called patient to assess for symptoms related to ATP episode, and notify of driving restrictions  Patient stated moderate to severe fatigue  AF burden 100%   Per patient's wife, the patient had a really bad reaction (swollen face, hives, rash) to Eliquis  on 12/2 patient self treated with topical steroids and symptoms decreased within 30 minutes but his hands and feet remained swollen next day.   This RN added Apixaban  to patient's allergy list  Patient's wife reported speaking with APP at heart failure clinic who switched patient to Xarelto and advised not to take for a few days to give time for Eliquis  to get out of patient's system.   Patient given a bunch of free samples of Xarelto by Pharmacist at advanced heart failure clinic to cover patient until in clinic visit on 03/18/24 (see 12/3 phone note).   Patient took first dose of Xarelto (15 mg PO) this morning per patient report.  Patient scheduled to follow up with Dr. Waddell on 12/29. Will keep appointment.  This RN felt that given complexity of situation and patient's symptoms that he should be seen sooner  This RN  spoke with APP's in clinic, in hospital, as well as RN's in AF clinic to try and find earliest availability for patient to be seen  APP in hospital reviewed case and gave this RN orders to increase TOPROL  XL 25 mg from Daily to BID  This RN executed orders for medication adjustment  This RN suggested to patient's wife that patient be seen in Afib clinic ASAP  She was agreeable to this suggestion  Messaged EP scheduling and got patient appointment in Afib clinic today at 3pm  New Toprol  XL prescription sent to pharmacy on 1st floor for patient to pick up after AF clinic visit today  Patient's spouse EXTREMELY appreciative of time and effort spent by this RN to get patient seen ASAP

## 2024-03-14 NOTE — Progress Notes (Addendum)
 Primary Care Physician: Shepard Ade, MD Primary Cardiologist: Peter Jordan, MD Electrophysiologist: Danelle Birmingham, MD     Referring Physician: Device clinic      [name] is a 68 y.o. male with a history of CAD s/p CABG x 3, T2DM, HLD, chronic systolic heart failure, ischemic cardiomyopathy, s/p CRT-D, VT, hemochromatosis, right bundle branch block, OSA, and paroxysmal atrial fibrillation who presents for consultation in the [facility].  Patient admitted November 2025 for acute on chronic systolic heart failure with history of being on sotalol  discontinued due to elevated creatinine.  Consultation by Dr. Birmingham noted if patient has more VT and renal function improves, sotalol  could be reconsidered.  If he develops refractory VT, then amiodarone  would be recommended.  Patient seen in the ED on 03/13/24 for urinary retention.  Device clinic alert today for A-fib in progress; 1 VT event 12/4 at 20: 34, 20 seconds in duration, paced terminated with 1 burst of ATP.  Device clinic noted rhythm was consistent with Afib with RVR and due to ventricular rate the device treated it as VT and led to ATP. Noted by wife the patient had a bad reaction to Eliquis  and is currently switched to Xarelto 15 mg samples. Toprol  increased to 25 mg BID. Patient is on Xarelto for stroke prevention.  On evaluation today, patient is currently in V paced rhythm.  Patient notes to overall feel much improved since hospital admission in November.  Today he notes to feel overall well and did not feel ATP episode yesterday evening.  He notes he was sitting on the recliner watching TV.  He took his first dose of Xarelto today.  He notes a significant improvement from his rash which he attributes to Eliquis .  Patient notes he has urinated multiple times today and feels like his issue yesterday is now resolved.  Today, he denies symptoms of palpitations, chest pain, shortness of breath,  orthopnea, PND, lower extremity edema, dizziness, presyncope, syncope, bleeding, or neurologic sequela. The patient is tolerating medications without difficulties and is otherwise without complaint today.    he has a BMI of Body mass index is 27.1 kg/m.SABRA Filed Weights   03/14/24 1515  Weight: 90.6 kg    Current Outpatient Medications  Medication Sig Dispense Refill   ALPRAZolam  (XANAX ) 0.5 MG tablet Take 1 tablet (0.5 mg total) by mouth at bedtime as needed. (Patient taking differently: Take 0.5 mg by mouth at bedtime as needed. Currently taking 0.25 mg - two at night) 2 tablet 0   cephALEXin  (KEFLEX ) 500 MG capsule Take 1 capsule (500 mg total) by mouth 2 (two) times daily. 14 capsule 0   cyanocobalamin  1000 MCG tablet Take 1 tablet (1,000 mcg total) by mouth daily. 30 tablet 0   escitalopram  (LEXAPRO ) 10 MG tablet Take 10 mg by mouth at bedtime.     fluticasone  (FLONASE ) 50 MCG/ACT nasal spray Place 1 spray into both nostrils daily as needed for allergies or rhinitis. (Patient taking differently: Place 1 spray into both nostrils as needed for allergies or rhinitis.)     folic acid  (FOLVITE ) 1 MG tablet Take 1 tablet (1 mg total) by mouth daily. 30 tablet 0   furosemide  (LASIX ) 40 MG tablet Take 1 tablet (40 mg total) by mouth daily. Take additional 40 mg for weight gain of 3 lbs in 1 day or 5 lbs in 1 week or leg swelling. 90 tablet 3   magnesium  chloride (SLOW-MAG) 64 MG TBEC SR  tablet Take 1 tablet (64 mg total) by mouth daily. 60 tablet 0   metoprolol  succinate (TOPROL  XL) 25 MG 24 hr tablet Take 1 tablet (25 mg total) by mouth in the morning and at bedtime. 60 tablet 3   NITROSTAT  0.4 MG SL tablet PLACE 1 TABLET UNDER THE TONGUE EVERY 5 MINUTES AS NEEDED FOR CHEST PAIN FOR UP TO 3 DOSES. 25 tablet 3   ONETOUCH VERIO test strip 1 each by Other route as needed for other.   6   pantoprazole  (PROTONIX ) 40 MG tablet Take 1 tablet (40 mg total) by mouth daily. 30 tablet 0   Probiotic Product  (PROBIOTIC PO) Take 1 tablet by mouth at bedtime.     rosuvastatin  (CRESTOR ) 20 MG tablet Take 1 tablet by mouth every Sunday.     spironolactone  (ALDACTONE ) 25 MG tablet Take 1 tablet (25 mg total) by mouth daily. 30 tablet 3   No current facility-administered medications for this encounter.    Atrial Fibrillation Management history:  Previous antiarrhythmic drugs: Sotalol , amiodarone  Previous cardioversions: none Previous ablations: None Anticoagulation history: Eliquis , Xarelto   ROS- All systems are reviewed and negative except as per the HPI above.  Physical Exam: BP 112/60   Pulse 87   Ht 6' (1.829 m)   Wt 90.6 kg   BMI 27.10 kg/m   GEN: Well nourished, well developed in no acute distress NECK: No JVD; No carotid bruits CARDIAC: Regular rate (V paced rhythm), no murmurs, rubs, gallops RESPIRATORY:  Clear to auscultation without rales, wheezing or rhonchi  ABDOMEN: Soft, non-tender, non-distended EXTREMITIES:  No edema; No deformity   EKG today demonstrates  EKG Interpretation Date/Time:  Friday March 14 2024 15:24:09 EST Ventricular Rate:  87 PR Interval:    QRS Duration:  156 QT Interval:  440 QTC Calculation: 529 R Axis:   244  Text Interpretation: Ventricular-paced rhythm with occasional Premature ventricular complexes Biventricular pacemaker detected Abnormal ECG When compared with ECG of 11-Mar-2024 14:26, PREVIOUS ECG IS PRESENT Confirmed by Terra Pac ([id number]) on 03/14/2024 3:25:24 PM    Echo 02/26/2024 demonstrated   1. Left ventricular ejection fraction, by estimation, is 30%. The left  ventricle has moderately decreased function. The left ventricle  demonstrates global hypokinesis. The left ventricular internal cavity size  was severely dilated. Left ventricular  diastolic parameters are indeterminate.   2. Right ventricular systolic function is normal. The right ventricular  size is normal. There is moderately elevated pulmonary artery systolic   pressure.   3. Left atrial size was mildly dilated.   4. The mitral valve is normal in structure. Mild mitral valve  regurgitation.   5. The aortic valve is tricuspid. Aortic valve regurgitation is not  visualized.   6. The inferior vena cava is dilated in size with <50% respiratory  variability, suggesting right atrial pressure of 15 mmHg.   ASSESSMENT & PLAN CHA2DS2-VASc Score = 4  The patient's score is based upon: CHF History: 1 HTN History: 0 Diabetes History: 1 Stroke History: 0 Vascular Disease History: 1 Age Score: 1 Gender Score: 0       ASSESSMENT AND PLAN: Paroxysmal Atrial Fibrillation (ICD10:  I48.0) The patient's CHA2DS2-VASc score is 4, indicating a 4.8% annual risk of stroke.    Patient is currently in V paced rhythm.  We discussed the cardioversion procedure to try to restore sinus rhythm.  We discussed transesophageal echocardiogram associated with cardioversion and its inherent risks and also cardioversion without transesophageal echocardiogram after 3 weeks  of uninterrupted anticoagulation.  Patient notes to not have cardiac awareness and after discussion of TEE/DCCV he wishes to not pursue that at this time.  He and wife note upcoming appointment with heart failure next week and would like to discuss with Dr. Venancio whether he should pursue a cardioversion before scheduled right heart cath on December 31 or if okay to wait until procedure.  He had labs drawn in ED visit yesterday (12/4), so if after discussion next week elects to move forward with cardioversion he can contact clinic to schedule procedure.  Secondary Hypercoagulable State (ICD10:  D68.69) The patient is at significant risk for stroke/thromboembolism based upon his CHA2DS2-VASc Score of 4.  Continue Rivaroxaban (Xarelto).  Patient was transitioned from Eliquis  to Xarelto and took first dose of Xarelto 15 mg today.  Continue Xarelto 15 mg daily.   Patient will contact office if elects to pursue  cardioversion prior to right heart cath and can either schedule DCCV after 3 weeks of uninterrupted anticoagulation or TEE/DCCV if before.   Terra Pac, [facility]  [facility] [address] [place], [place] [address] [phone]

## 2024-03-16 NOTE — Telephone Encounter (Signed)
Agree with above recs

## 2024-03-18 ENCOUNTER — Ambulatory Visit (HOSPITAL_COMMUNITY)
Admission: RE | Admit: 2024-03-18 | Discharge: 2024-03-18 | Disposition: A | Discharge status: Home / Self Care | Source: Ambulatory Visit | Attending: Cardiology | Admitting: Cardiology

## 2024-03-18 ENCOUNTER — Ambulatory Visit (HOSPITAL_COMMUNITY): Payer: Self-pay | Admitting: Physician Assistant

## 2024-03-18 ENCOUNTER — Other Ambulatory Visit (HOSPITAL_COMMUNITY): Payer: Self-pay | Admitting: Cardiology

## 2024-03-18 DIAGNOSIS — I493 Ventricular premature depolarization: Secondary | ICD-10-CM | POA: Insufficient documentation

## 2024-03-18 DIAGNOSIS — Z950 Presence of cardiac pacemaker: Secondary | ICD-10-CM | POA: Insufficient documentation

## 2024-03-18 DIAGNOSIS — R9431 Abnormal electrocardiogram [ECG] [EKG]: Secondary | ICD-10-CM | POA: Insufficient documentation

## 2024-03-18 DIAGNOSIS — I480 Paroxysmal atrial fibrillation: Secondary | ICD-10-CM | POA: Insufficient documentation

## 2024-03-18 DIAGNOSIS — Z7901 Long term (current) use of anticoagulants: Secondary | ICD-10-CM | POA: Diagnosis not present

## 2024-03-18 DIAGNOSIS — I5022 Chronic systolic (congestive) heart failure: Secondary | ICD-10-CM

## 2024-03-18 LAB — BASIC METABOLIC PANEL WITH GFR
Anion gap: 8 (ref 5–15)
BUN: 30 mg/dL — ABNORMAL HIGH (ref 8–23)
CO2: 26 mmol/L (ref 22–32)
Calcium: 9.7 mg/dL (ref 8.9–10.3)
Chloride: 102 mmol/L (ref 98–111)
Creatinine, Ser: 1.61 mg/dL — ABNORMAL HIGH (ref 0.61–1.24)
GFR, Estimated: 47 mL/min — ABNORMAL LOW (ref >60)
Glucose, Bld: 189 mg/dL — ABNORMAL HIGH (ref 70–99)
Potassium: 3.6 mmol/L (ref 3.5–5.1)
Sodium: 136 mmol/L (ref 135–145)

## 2024-03-18 MED ORDER — RIVAROXABAN 15 MG PO TABS: 15.0000 mg | ORAL_TABLET | Freq: Every day | ORAL | 5 refills | Status: DC

## 2024-03-18 NOTE — Patient Instructions (Addendum)
    Dear [name]  You are scheduled for a TEE (Transesophageal Echocardiogram) Guided Cardioversion on Friday, December 12 with Dr. Rolan.  Please arrive at the [name] (Main Entrance A) at [facility]: [address] [place], [place] [address] at 10:30 AM (This time is 1.5 hour(s) before your procedure to ensure your preparation).   Free valet parking service is available. You will check in at ADMITTING.   *Please Note: You will receive a call the day before your procedure to confirm the appointment time. That time may have changed from the original time based on the schedule for that day.*    DIET:  Nothing to eat or drink after midnight except a sip of water with medications (see medication instructions below)  MEDICATION INSTRUCTIONS: !!IF ANY NEW MEDICATIONS ARE STARTED AFTER TODAY, PLEASE NOTIFY YOUR PROVIDER AS SOON AS POSSIBLE!!  FYI: Medications such as Semaglutide (Ozempic, Wegovy), Tirzepatide (Mounjaro, Zepbound), Dulaglutide (Trulicity), etc (GLP1 agonists) AND Canagliflozin (Invokana), Dapagliflozin  (Farxiga ), Empagliflozin (Jardiance), Ertugliflozin (Steglatro), Bexagliflozin [employer]) or any combination with one of these drugs such as Invokamet (Canagliflozin/Metformin ), Synjardy (Empagliflozin/Metformin ), etc (SGLT2 inhibitors) must be held around the time of a procedure. This is not a comprehensive list of all of these drugs. Please review all of your medications and talk to your provider if you take any one of these. If you are not sure, ask your provider.     Continue taking your anticoagulant (blood thinner): Rivaroxaban  (Xarelto ).  You will need to continue this after your procedure until you are told by your provider that it is safe to stop.    LABS:   Pre procedure labs are not needed-recent labs done 03/13/2024  FYI:  For your safety, and to allow us  to monitor your vital signs accurately during the surgery/procedure we request: If you  have artificial nails, gel coating, SNS etc, please have those removed prior to your surgery/procedure. Not having the nail coverings /polish removed may result in cancellation or delay of your surgery/procedure.  Your support person will be asked to wait in the waiting room during your procedure.  It is OK to have someone drop you off and come back when you are ready to be discharged.  You cannot drive after the procedure and will need someone to drive you home.  Bring your insurance cards.  *Special Note: Every effort is made to have your procedure done on time. Occasionally there are emergencies that occur at the hospital that may cause delays. Please be patient if a delay does occur.

## 2024-03-18 NOTE — Progress Notes (Signed)
 Xarelto  15 mg daily sent to patient preferred pharmacy- per Manuelita Dutch PA-C. Patient tolerated Xarelto , and is agreeable.   Eliquis  was added to patients allergy list.

## 2024-03-20 NOTE — Progress Notes (Signed)
 Pt called for pre procedure instructions.  Message left on ID vocemail Arrival time 1030 NPO after midnight explained Instructed to take am meds with sip of water and confirmed blood thinner consistency Instructed pt need for ride home tomorrow and have responsible adult with them for 24 hrs post procedure.

## 2024-03-21 ENCOUNTER — Ambulatory Visit (HOSPITAL_COMMUNITY)
Admission: RE | Admit: 2024-03-21 | Discharge: 2024-03-21 | Disposition: A | Attending: Cardiology | Admitting: Cardiology

## 2024-03-21 ENCOUNTER — Other Ambulatory Visit (HOSPITAL_COMMUNITY): Payer: Self-pay

## 2024-03-21 ENCOUNTER — Ambulatory Visit (HOSPITAL_COMMUNITY)
Admission: RE | Admit: 2024-03-21 | Discharge: 2024-03-21 | Disposition: A | Source: Home / Self Care | Attending: Cardiology | Admitting: Cardiology

## 2024-03-21 ENCOUNTER — Ambulatory Visit (HOSPITAL_COMMUNITY): Admitting: Anesthesiology

## 2024-03-21 ENCOUNTER — Encounter (HOSPITAL_COMMUNITY): Admission: RE | Disposition: A | Payer: Self-pay | Source: Home / Self Care | Attending: Cardiology

## 2024-03-21 ENCOUNTER — Other Ambulatory Visit: Payer: Self-pay

## 2024-03-21 ENCOUNTER — Encounter (HOSPITAL_COMMUNITY): Payer: Self-pay | Admitting: Cardiology

## 2024-03-21 DIAGNOSIS — I081 Rheumatic disorders of both mitral and tricuspid valves: Secondary | ICD-10-CM | POA: Insufficient documentation

## 2024-03-21 DIAGNOSIS — I4891 Unspecified atrial fibrillation: Secondary | ICD-10-CM | POA: Diagnosis not present

## 2024-03-21 DIAGNOSIS — I252 Old myocardial infarction: Secondary | ICD-10-CM | POA: Insufficient documentation

## 2024-03-21 DIAGNOSIS — N184 Chronic kidney disease, stage 4 (severe): Secondary | ICD-10-CM | POA: Diagnosis not present

## 2024-03-21 DIAGNOSIS — E1122 Type 2 diabetes mellitus with diabetic chronic kidney disease: Secondary | ICD-10-CM | POA: Diagnosis not present

## 2024-03-21 DIAGNOSIS — I5022 Chronic systolic (congestive) heart failure: Secondary | ICD-10-CM | POA: Diagnosis not present

## 2024-03-21 DIAGNOSIS — I472 Ventricular tachycardia, unspecified: Secondary | ICD-10-CM | POA: Insufficient documentation

## 2024-03-21 DIAGNOSIS — I480 Paroxysmal atrial fibrillation: Secondary | ICD-10-CM | POA: Diagnosis present

## 2024-03-21 DIAGNOSIS — I255 Ischemic cardiomyopathy: Secondary | ICD-10-CM | POA: Diagnosis not present

## 2024-03-21 DIAGNOSIS — Z7901 Long term (current) use of anticoagulants: Secondary | ICD-10-CM | POA: Insufficient documentation

## 2024-03-21 DIAGNOSIS — I340 Nonrheumatic mitral (valve) insufficiency: Secondary | ICD-10-CM | POA: Diagnosis not present

## 2024-03-21 DIAGNOSIS — I130 Hypertensive heart and chronic kidney disease with heart failure and stage 1 through stage 4 chronic kidney disease, or unspecified chronic kidney disease: Secondary | ICD-10-CM | POA: Diagnosis not present

## 2024-03-21 DIAGNOSIS — Z87891 Personal history of nicotine dependence: Secondary | ICD-10-CM | POA: Diagnosis not present

## 2024-03-21 DIAGNOSIS — K219 Gastro-esophageal reflux disease without esophagitis: Secondary | ICD-10-CM | POA: Insufficient documentation

## 2024-03-21 DIAGNOSIS — Z79899 Other long term (current) drug therapy: Secondary | ICD-10-CM | POA: Insufficient documentation

## 2024-03-21 DIAGNOSIS — I361 Nonrheumatic tricuspid (valve) insufficiency: Secondary | ICD-10-CM

## 2024-03-21 DIAGNOSIS — E785 Hyperlipidemia, unspecified: Secondary | ICD-10-CM | POA: Diagnosis not present

## 2024-03-21 DIAGNOSIS — Z951 Presence of aortocoronary bypass graft: Secondary | ICD-10-CM | POA: Diagnosis not present

## 2024-03-21 DIAGNOSIS — Z95810 Presence of automatic (implantable) cardiac defibrillator: Secondary | ICD-10-CM | POA: Diagnosis not present

## 2024-03-21 DIAGNOSIS — G4730 Sleep apnea, unspecified: Secondary | ICD-10-CM | POA: Diagnosis not present

## 2024-03-21 DIAGNOSIS — I2510 Atherosclerotic heart disease of native coronary artery without angina pectoris: Secondary | ICD-10-CM | POA: Diagnosis not present

## 2024-03-21 HISTORY — PX: CARDIOVERSION: EP1203

## 2024-03-21 HISTORY — PX: TRANSESOPHAGEAL ECHOCARDIOGRAM (CATH LAB): EP1270

## 2024-03-21 SURGERY — TRANSESOPHAGEAL ECHOCARDIOGRAM (TEE) (CATHLAB)
Anesthesia: Monitor Anesthesia Care

## 2024-03-21 MED ORDER — PROPOFOL 500 MG/50ML IV EMUL
INTRAVENOUS | Status: DC | PRN
  Administered 2024-03-21: 150 ug/kg/min via INTRAVENOUS

## 2024-03-21 MED ORDER — SODIUM CHLORIDE 0.9 % IV SOLN: INTRAVENOUS | Status: DC

## 2024-03-21 MED ORDER — RIVAROXABAN 15 MG PO TABS: 15.0000 mg | ORAL_TABLET | Freq: Every day | ORAL | Status: AC

## 2024-03-21 MED ORDER — RIVAROXABAN 15 MG PO TABS: 15.0000 mg | ORAL_TABLET | Freq: Every day | ORAL | 0 refills | Status: DC

## 2024-03-21 MED ORDER — PHENYLEPHRINE HCL-NACL 20-0.9 MG/250ML-% IV SOLN
INTRAVENOUS | Status: DC | PRN
  Administered 2024-03-21: 25 ug/min via INTRAVENOUS

## 2024-03-21 MED ORDER — PROPOFOL 10 MG/ML IV BOLUS
INTRAVENOUS | Status: DC | PRN
  Administered 2024-03-21 (×2): 30 mg via INTRAVENOUS

## 2024-03-21 SURGICAL SUPPLY — 1 items: PAD DEFIB RADIO PHYSIO CONN (PAD) ×1 IMPLANT

## 2024-03-21 NOTE — Transfer of Care (Signed)
 Immediate Anesthesia Transfer of Care Note  Patient: [name]  Procedure(s) Performed: TRANSESOPHAGEAL ECHOCARDIOGRAM CARDIOVERSION  Patient Location: Cath Lab  Anesthesia Type:MAC  Level of Consciousness: awake, alert , and oriented  Airway & Oxygen Therapy: Patient Spontanous Breathing and Patient connected to nasal cannula oxygen  Post-op Assessment: Report given to RN and Post -op Vital signs reviewed and stable  Post vital signs: Reviewed and stable  Last Vitals:  Vitals Value Taken Time  BP    Temp    Pulse    Resp    SpO2      Last Pain:  Vitals:   03/21/24 1138  TempSrc:   PainSc: 0-No pain         Complications: No notable events documented.

## 2024-03-21 NOTE — Addendum Note (Signed)
 Addended by: EZZARD PALMA F on: 03/21/2024 02:54 PM   Modules accepted: Orders

## 2024-03-21 NOTE — Interval H&P Note (Signed)
 History and Physical Interval Note:  03/21/2024 12:02 PM  [name]  has presented today for surgery, with the diagnosis of AFIB.  The various methods of treatment have been discussed with the patient and family. After consideration of risks, benefits and other options for treatment, the patient has consented to  Procedures: TRANSESOPHAGEAL ECHOCARDIOGRAM (N/A) CARDIOVERSION (N/A) as a surgical intervention.  The patient's history has been reviewed, patient examined, no change in status, stable for surgery.  I have reviewed the patient's chart and labs.  Questions were answered to the patient's satisfaction.     Naethan Bracewell [employer]

## 2024-03-21 NOTE — Anesthesia Postprocedure Evaluation (Signed)
 Anesthesia Post Note  Patient: [name]  Procedure(s) Performed: TRANSESOPHAGEAL ECHOCARDIOGRAM CARDIOVERSION     Patient location during evaluation: Cath Lab Anesthesia Type: General Level of consciousness: awake and alert Pain management: pain level controlled Vital Signs Assessment: post-procedure vital signs reviewed and stable Respiratory status: spontaneous breathing, nonlabored ventilation and respiratory function stable Cardiovascular status: blood pressure returned to baseline and stable Postop Assessment: no apparent nausea or vomiting Anesthetic complications: no   No notable events documented.  Last Vitals:  Vitals:   03/21/24 1220 03/21/24 1225  BP: (!) 107/58 (!) (P) 106/55  Pulse: 71 69  Resp: (!) 21   Temp:    SpO2: (!) 88% 93%    Last Pain:  Vitals:   03/21/24 1138  TempSrc:   PainSc: 0-No pain                 [name]

## 2024-03-21 NOTE — Anesthesia Preprocedure Evaluation (Addendum)
 Anesthesia Evaluation  Patient identified by MRN, date of birth, ID band Patient awake    Reviewed: Allergy & Precautions, NPO status , Patient's Chart, lab work & pertinent test results, reviewed documented beta blocker date and time   History of Anesthesia Complications Negative for: history of anesthetic complications  Airway Mallampati: III  TM Distance: >3 FB Neck ROM: Full    Dental  (+) Teeth Intact, Dental Advisory Given   Pulmonary neg shortness of breath, sleep apnea , neg COPD, neg recent URI, former smoker   breath sounds clear to auscultation       Cardiovascular + CAD, + Past MI, + Peripheral Vascular Disease and +CHF  + dysrhythmias Atrial Fibrillation + Cardiac Defibrillator  Rhythm:Irregular  1. Left ventricular ejection fraction, by estimation, is 30%. The left  ventricle has moderately decreased function. The left ventricle  demonstrates global hypokinesis. The left ventricular internal cavity size  was severely dilated. Left ventricular  diastolic parameters are indeterminate.   2. Right ventricular systolic function is normal. The right ventricular  size is normal. There is moderately elevated pulmonary artery systolic  pressure.   3. Left atrial size was mildly dilated.   4. The mitral valve is normal in structure. Mild mitral valve  regurgitation.   5. The aortic valve is tricuspid. Aortic valve regurgitation is not  visualized.   6. The inferior vena cava is dilated in size with <50% respiratory  variability, suggesting right atrial pressure of 15 mmHg.    Prox LAD to Mid LAD lesion, 85 %stenosed.  Ost Ramus to Ramus lesion, 55 %stenosed.  Prox Cx to Mid Cx lesion, 100 %stenosed.  Ost RCA to Dist RCA lesion, 100 %stenosed.  SVG graft was visualized by angiography and is small.  SVG graft was visualized by angiography and is small.  LIMA graft was visualized by angiography and is large and  anatomically normal.  There is moderate left ventricular systolic dysfunction.  LV end diastolic pressure is moderately elevated.  The left ventricular ejection fraction is 35-45% by visual estimate.   1. Severe 3 vessel obstructive CAD 2. Large patent LIMA to the LAD 3. Patent SVG to OM2. This is a tiny graft supplying a very small OM  4. Patent SVG to PDA. This is also a tiny graft supplying a very tiny PDA 5. Moderate LV dysfunction. 6. Elevated LV EDP   Plan: medical therapy. Antiarrhythmic drug therapy per EP team.    Neuro/Psych negative neurological ROS     GI/Hepatic Neg liver ROS,GERD  Medicated and Controlled,,  Endo/Other  diabetes, Type 2    Renal/GU Renal InsufficiencyRenal diseaseLab Results      Component                Value               Date                      NA                       136                 03/18/2024                K                        3.6  03/18/2024                CO2                      26                  03/18/2024                GLUCOSE                  189 (H)             03/18/2024                BUN                      30 (H)              03/18/2024                CREATININE               1.61 (H)            03/18/2024                CALCIUM                   9.7                 03/18/2024                GFR                      44.07 (L)           04/14/2013                EGFR                     36 (L)              08/30/2022                GFRNONAA                 47 (L)              03/18/2024                Musculoskeletal  (+) Arthritis ,    Abdominal   Peds  Hematology  (+) Blood dyscrasia, anemia Lab Results      Component                Value               Date                      WBC                      8.0                 03/13/2024                HGB                      12.2 (L)            03/13/2024  HCT                      38.1 (L)            03/13/2024                 MCV                      105.0 (H)           03/13/2024                PLT                      180                 03/13/2024             xarelto    Anesthesia Other Findings   Reproductive/Obstetrics                              Anesthesia Physical Anesthesia Plan  ASA: 3  Anesthesia Plan: MAC and General   Post-op Pain Management: Minimal or no pain anticipated   Induction: Intravenous  PONV Risk Score and Plan: 2 and Treatment may vary due to age or medical condition and Propofol  infusion  Airway Management Planned: Nasal Cannula, Natural Airway and Simple Face Mask  Additional Equipment: None  Intra-op Plan:   Post-operative Plan:   Informed Consent: I have reviewed the patients History and Physical, chart, labs and discussed the procedure including the risks, benefits and alternatives for the proposed anesthesia with the patient or authorized representative who has indicated his/her understanding and acceptance.     Dental advisory given  Plan Discussed with: CRNA  Anesthesia Plan Comments:          Anesthesia Quick Evaluation

## 2024-03-21 NOTE — Procedures (Signed)
 Electrical Cardioversion Procedure Note [name] [id number] 1955/10/18  Procedure: Electrical Cardioversion Indications:  Atrial Fibrillation  Procedure Details Consent: Risks of procedure as well as the alternatives and risks of each were explained to the (patient/caregiver).  Consent for procedure obtained. Time Out: Verified patient identification, verified procedure, site/side was marked, verified correct patient position, special equipment/implants available, medications/allergies/relevent history reviewed, required imaging and test results available.  Performed  Patient placed on cardiac monitor, pulse oximetry, supplemental oxygen as necessary.  Sedation given: Propofol  per anesthesiology Pacer pads placed anterior and posterior chest.  Cardioverted 1 time(s).  Cardioverted at 360J.  Evaluation Findings: Post procedure EKG shows: NSR Complications: None Patient did tolerate procedure well.   Ezra Shuck 03/21/2024, 12:16 PM

## 2024-03-21 NOTE — Discharge Instructions (Signed)
 Electrical Cardioversion Electrical cardioversion is the delivery of a jolt of electricity to restore a normal rhythm to the heart. A rhythm that is too fast or is not regular keeps the heart from pumping well. In this procedure, sticky patches or metal paddles are placed on the chest to deliver electricity to the heart from a device. This procedure may be done in an emergency if: There is low or no blood pressure as a result of the heart rhythm. Normal rhythm must be restored as fast as possible to protect the brain and heart from further damage. It may save a life. This may also be a scheduled procedure for irregular or fast heart rhythms that are not immediately life-threatening.  What can I expect after the procedure? Your blood pressure, heart rate, breathing rate, and blood oxygen level will be monitored until you leave the hospital or clinic. Your heart rhythm will be watched to make sure it does not change. You may have some redness on the skin where the shocks were given. Over the counter cortizone cream may be helpful.  Follow these instructions at home: Do not drive for 24 hours if you were given a sedative during your procedure. Take over-the-counter and prescription medicines only as told by your health care provider. Ask your health care provider how to check your pulse. Check it often. Rest for 48 hours after the procedure or as told by your health care provider. Avoid or limit your caffeine use as told by your health care provider. Keep all follow-up visits as told by your health care provider. This is important. Contact a health care provider if: You feel like your heart is beating too quickly or your pulse is not regular. You have a serious muscle cramp that does not go away. Get help right away if: You have discomfort in your chest. You are dizzy or you feel faint. You have trouble breathing or you are short of breath. Your speech is slurred. You have trouble moving an  arm or leg on one side of your body. Your fingers or toes turn cold or blue. Summary Electrical cardioversion is the delivery of a jolt of electricity to restore a normal rhythm to the heart. This procedure may be done right away in an emergency or may be a scheduled procedure if the condition is not an emergency. Generally, this is a safe procedure. After the procedure, check your pulse often as told by your health care provider. This information is not intended to replace advice given to you by your health care provider. Make sure you discuss any questions you have with your health care provider. Document Revised: 10/28/2018 Document Reviewed: 10/28/2018 Elsevier Patient EducatiElectrical Cardioversion Electrical cardioversion is the delivery of a jolt of electricity to restore a normal rhythm to the heart. A rhythm that is too fast or is not regular (arrhythmia) keeps the heart from pumping blood well. There is also another type of cardioversion called a chemical (pharmacologic) cardioversion. This is when your health care provider gives you one or more medicines to bring back your regular heart rhythm. Electrical cardioversion is done as a scheduled procedure for arrhythmiasthat are not life-threatening. Electrical cardioversion may also be done in an emergency for sudden life-threatening arrhythmias. Tell a health care provider about: Any allergies you have. All medicines you are taking, including vitamins, herbs, eye drops, creams, and over-the-counter medicines. Any problems you or family members have had with sedatives or anesthesia. Any bleeding problems you have. Any surgeries you  have had, including a pacemaker, defibrillator, or other implanted device. Any medical conditions you have. Whether you are pregnant or may be pregnant. What are the risks? Your provider will talk with you about risks. These include: Allergic reactions to medicines. Irritation to the skin on your chest or  back where the sticky pads (electrodes) or paddles were put during electrical cardioversion. A blood clot that breaks free and travels to other parts of your body, such as your brain. Return of a worse abnormal heart rhythm that will need to be treated with medicines, a pacemaker, or an implantable cardioverter defibrillator (ICD). What happens before the procedure? Medicines Your provider may give you: Blood-thinning medicines (anticoagulants) so your blood does not clot as easily. If your provider gives you this medicine, you may need to take it for 4 weeks before the procedure. Medicines to help stabilize your heart rate and rhythm. Ask your provider about: Changing or stopping your regular medicines. These include any diabetes medicines or blood thinners you take. Taking medicines such as aspirin  and ibuprofen. These medicines can thin your blood. Do not take them unless your provider tells you to. Taking over-the-counter medicines, vitamins, herbs, and supplements. General instructions Follow instructions from your provider about what you may eat and drink. Do not put any lotions, powders, or ointments on your chest and back for 24 hours before the procedure. They can cause problems with the electrodes or paddles used to deliver electricity to your heart. Do not wear jewelry as this can interfere with delivering electricity to your heart. If you will be going home right after the procedure, plan to have a responsible adult: Take you home from the hospital or clinic. You will not be allowed to drive. Care for you for the time you are told. Tests You may have an exam or testing. This may include: Blood labs. A transesophageal echocardiogram (TEE). What happens during the procedure?     An IV will be inserted into one of your veins. You will be given a sedative. This helps you relax. Electrodes or metal paddles will be placed on your chest. They may be placed in one of these ways: One  placed on your right chest, the other on the left ribs. One placed on your chest and the other on your back. An electrical shock will be delivered. The shock briefly stops (resets) your heart rhythm. Your provider will check to see if your heart rhythm is now normal. Some people need only one shock. Some need more to restore a normal heart rhythm. The procedure may vary among providers and hospitals. What happens after the procedure? Your blood pressure, heart rate, breathing rate, and blood oxygen level will be monitored until you leave the hospital or clinic. Your heart rhythm will be watched to make sure it does not change. This information is not intended to replace advice given to you by your health care provider. Make sure you discuss any questions you have with your health care provider. Document Revised: 11/17/2021 Document Reviewed: 11/17/2021 Elsevier Patient Education  2024 Else[employer]on  2020 Else[employer] Inc. Transesophageal Echocardiogram  Transesophageal echocardiogram, or TEE, is a test that uses sound waves to make pictures of your heart. TEE is done using a small ultrasound probe. The probe is passed down your esophagus, which is the part of your body that moves food from your mouth to your stomach. Because your heart is near your esophagus, the TEE will give clear pictures of your heart. Your health  care provider can use a TEE: To see how different parts of your heart are working. To check for problems with your heart, such as infection, blood clots, or growths. You may feel the probe in your throat, but the test usually doesn't cause pain or affect your breathing. Tell a health care provider about: Any allergies you have. All medicines you are taking. These include vitamins, herbs, eye drops, creams, and over-the-counter medicines. Any problems you or family members have had with anesthesia. Any bleeding problems you have. Any surgeries you have had. Any medical conditions  you have. Any trouble with swallowing. Whether you have or have had a blockage of the esophagus. Whether you're pregnant or may be pregnant. What are the risks? Your provider will talk with you about risks. These may include: Damage to nearby structures or organs. A tear of the esophagus. Fast or uneven heartbeats. A hoarse voice or trouble swallowing. Bleeding. What happens before the procedure? Medicines Ask about changing or stopping: Any medicines you take. Any vitamins, herbs, or supplements you take. Do not take aspirin  or ibuprofen unless you're told to. General instructions Follow instructions about what you may eat and drink. You will need to take out any dentures or dental retainers. Ask if you'll be staying overnight in the hospital. If you'll be going home right after the test, plan to have a responsible adult: Drive you home from the hospital or clinic. You won't be allowed to drive. Stay with you for the time you are told. What happens during the procedure?  An IV will be put into a vein in your hand or arm. You will be given: A sedative. This helps you relax. Anesthesia. This keeps you from feeling pain. It will be sprayed, or you'll gargle it, to numb the back of your throat. You may be asked to lie on your left side. A bite block will be put in your mouth. This keeps you from biting the probe. The tip of the probe will be placed into the back of your mouth. You'll be asked to swallow. Once the probe is in place, your provider will take pictures of your heart. The probe and bite block will be taken out after the test is done. The procedure may vary among providers and hospitals.  What can I expect after the procedure? You will be watched closely until you leave. This includes checking your blood pressure, heart rate, breathing rate, and blood oxygen level. Your throat may feel numb or sore. This will get better over time. You will not be allowed to eat or drink  until the numbness has gone away. Ask when your test results will be ready and how to get them. You may need to call or meet with your provider to discuss your results.  This information is not intended to replace advice given to you by your health care provider. Make sure you discuss any questions you have with your health care provider.  Document Revised: 12/28/2022 Document Reviewed: 06/07/2022 Elsevier Patient Education  2024 [employer].

## 2024-03-21 NOTE — CV Procedure (Signed)
 Procedure: TEE  Sedation: Per anesthesiology  Indication: Atrial fibrillation  Findings: Please see echo section for full report. Moderately dilated left ventricle with normal wall thickness, LV EF 30-35% with diffuse hypokinesis.  Mildly dilated RV with mildly decreased systolic function. Pacemaker leads in right heart.   Moderate left atrial enlargement, no LA appendage thrombus.  Mild right atrial enlargement.  No PFO or ASD, negative bubble study. Mild-moderate mitral regurgitation.  Trileaflet aortic valve with no stenosis or regurgitation.  Mild tricuspid regurgitation. Normal caliber thoracic aorta with grade 3 plaque.   May proceed to DCCV.   Ezra Shuck 03/21/2024 12:16 PM

## 2024-03-24 ENCOUNTER — Ambulatory Visit (HOSPITAL_COMMUNITY): Admitting: Physician Assistant

## 2024-03-24 ENCOUNTER — Telehealth: Payer: Self-pay

## 2024-03-24 NOTE — Telephone Encounter (Signed)
 Alert remote transmission:  Weekly ATP Delivered Episodes >= Threshold.  3 VT events 12/15 @ 03:58, 07:16, and 07:19, all 19sec in duration, HR's 182-188, V>A, all pace terminated with 1 burst of ATP   1 NSVT, 14 beats in duration  Patient underwent TEE and DCCV on Friday 12/12 by Dr. Rolan.  Recent ATP therapy for AF/RVR; however, below events are 4 ATP events for bursts of true VT, majority occurring since 3am this morning.    LM to have patient call and discuss symptoms, f/u plan and driving restrictions.

## 2024-03-25 LAB — ECHO TEE

## 2024-03-25 MED ORDER — METOPROLOL SUCCINATE ER 50 MG PO TB24: 50.0000 mg | ORAL_TABLET | Freq: Two times a day (BID) | ORAL | 2 refills | Status: AC

## 2024-03-25 NOTE — Telephone Encounter (Signed)
 Looks like appropriate ATP. I'd increase the toprol  to 50 bid.

## 2024-03-25 NOTE — Telephone Encounter (Signed)
 Spoke with patient.  He states his BP usually runs 120's/ 60's and is aware that Metoprolol  can impact BP so he will continue to monitor for that.    RX sent in to patient's pharmacy.  We will continue to monitor.

## 2024-03-25 NOTE — Telephone Encounter (Signed)
 Patient states he is doing well, was likely asleep yesterday morning when events occurred. Denied any symptoms.  Recent procedures on Firday 03/21/24 (TEE, DCCV).  No AF seen on alert report since treatment.  Currently taking all of his medications.   [place] DMV driving restrictions given for 6 months, patient verbalizes understanding.   We will continue to monitor. Patient knows to call us  if any symptoms develop, if severe he will go to the ER.

## 2024-03-26 NOTE — Telephone Encounter (Signed)
 Noted, when filling the higher dose of metoprolol , flag appears related to his linagliptin-metformin  allergy.  (Medium reaction of GI upset, chest pain).    Patient has been taking Metoprolol  25mg  without difficulty.  Increase to 50mg  ordered and sent to the pharmacy.  Just wanted you to be aware.  (As these drugs are in different classes, not sure why the allergy triggered).

## 2024-03-27 ENCOUNTER — Ambulatory Visit: Payer: Medicare Other

## 2024-03-27 DIAGNOSIS — I472 Ventricular tachycardia, unspecified: Secondary | ICD-10-CM | POA: Diagnosis not present

## 2024-03-28 LAB — CUP PACEART REMOTE DEVICE CHECK
Battery Remaining Longevity: 85 mo
Battery Voltage: 2.99 V
Brady Statistic RV Percent Paced: 96.87 %
Date Time Interrogation Session: 20251217231535
HighPow Impedance: 60 Ohm
Implantable Lead Connection Status: 753985
Implantable Lead Connection Status: 753985
Implantable Lead Connection Status: 753985
Implantable Lead Implant Date: 20130513
Implantable Lead Implant Date: 20130513
Implantable Lead Implant Date: 20240507
Implantable Lead Location: 753858
Implantable Lead Location: 753859
Implantable Lead Location: 753860
Implantable Lead Model: 181
Implantable Lead Model: 4598
Implantable Lead Model: 5076
Implantable Lead Serial Number: 316784
Implantable Pulse Generator Implant Date: 20240507
Lead Channel Impedance Value: 304 Ohm
Lead Channel Impedance Value: 323 Ohm
Lead Channel Impedance Value: 361 Ohm
Lead Channel Impedance Value: 361 Ohm
Lead Channel Impedance Value: 361 Ohm
Lead Channel Impedance Value: 361 Ohm
Lead Channel Impedance Value: 418 Ohm
Lead Channel Impedance Value: 437 Ohm
Lead Channel Impedance Value: 570 Ohm
Lead Channel Impedance Value: 608 Ohm
Lead Channel Impedance Value: 608 Ohm
Lead Channel Impedance Value: 627 Ohm
Lead Channel Impedance Value: 665 Ohm
Lead Channel Pacing Threshold Amplitude: 0.625 V
Lead Channel Pacing Threshold Amplitude: 1 V
Lead Channel Pacing Threshold Amplitude: 1 V
Lead Channel Pacing Threshold Pulse Width: 0.4 ms
Lead Channel Pacing Threshold Pulse Width: 0.4 ms
Lead Channel Pacing Threshold Pulse Width: 0.4 ms
Lead Channel Sensing Intrinsic Amplitude: 0.8 mV
Lead Channel Sensing Intrinsic Amplitude: 12 mV
Lead Channel Setting Pacing Amplitude: 1.25 V
Lead Channel Setting Pacing Amplitude: 1.5 V
Lead Channel Setting Pacing Amplitude: 2 V
Lead Channel Setting Pacing Pulse Width: 0.4 ms
Lead Channel Setting Pacing Pulse Width: 0.4 ms
Lead Channel Setting Sensing Sensitivity: 0.45 mV
Zone Setting Status: 755011

## 2024-03-30 NOTE — Progress Notes (Signed)
 Remote ICD Transmission

## 2024-03-31 ENCOUNTER — Encounter: Payer: Self-pay | Admitting: Internal Medicine

## 2024-04-01 ENCOUNTER — Telehealth: Payer: Self-pay

## 2024-04-01 MED ORDER — AMIODARONE HCL 200 MG PO TABS: ORAL_TABLET | ORAL | 0 refills | Status: DC

## 2024-04-01 NOTE — Telephone Encounter (Signed)
 Alert received:  Alert remote transmission:  Weekly ATP Delivered Episodes >= Threshold 3 VT events, 19-20sec in duration, HRs 171-188, V>A, all pace terminated with one burst of ATP   Likely nocturnal events.  Pt with recent increase in VT events.  Pt advised on 03/25/2024 to increase Toprol  XL 50 mg to BID.  Pt has scheduled follow up on December 29 with Dr. Waddell.  Will see if any additional action needed prior to follow up appointment.

## 2024-04-01 NOTE — Telephone Encounter (Signed)
 Outreach made to Pt.  Discussed starting amiodarone  d/t further VT events.  Per Pt he did not have these issues until he was told to stop his sotalol , but he is willing to start taking the amiodarone .  Advised Pt to take Amiodarone  200 mg -  2 tablets by mouth twice a day.  Advised if he had GI upset to try taking medication with food.    Will send 90 tablets to his pharmacy d/t close follow up with Dr. Waddell and medication regimen may change.

## 2024-04-01 NOTE — Telephone Encounter (Signed)
 Per RU:   Confirm Pt has stopped sotalol  Start amiodarone  400 mg PO BID until follow up with Dr. Waddell and HF  Attempted to contact Pt.  Call went to VM.

## 2024-04-01 NOTE — Telephone Encounter (Signed)
 Discussed with Charlies Arthur.  Will continue to evaluate.

## 2024-04-02 ENCOUNTER — Telehealth (HOSPITAL_COMMUNITY): Payer: Self-pay | Admitting: *Deleted

## 2024-04-02 DIAGNOSIS — I5022 Chronic systolic (congestive) heart failure: Secondary | ICD-10-CM

## 2024-04-02 DIAGNOSIS — I472 Ventricular tachycardia, unspecified: Secondary | ICD-10-CM

## 2024-04-02 NOTE — Telephone Encounter (Signed)
 Per Manuelita Dutch, PA pt's RHC sch for 12/31 needs to be changed to R/L [facility] and needs bmet on Mon prior to cath.  Pt aware, lab appt sch 12/29

## 2024-04-06 NOTE — Telephone Encounter (Signed)
Thank you   No change in plan

## 2024-04-07 ENCOUNTER — Ambulatory Visit: Attending: Internal Medicine | Admitting: Internal Medicine

## 2024-04-07 ENCOUNTER — Encounter (HOSPITAL_COMMUNITY): Payer: Self-pay

## 2024-04-07 ENCOUNTER — Ambulatory Visit (HOSPITAL_COMMUNITY)

## 2024-04-07 ENCOUNTER — Encounter: Payer: Self-pay | Admitting: Internal Medicine

## 2024-04-07 ENCOUNTER — Ambulatory Visit: Admitting: Neurology

## 2024-04-07 ENCOUNTER — Ambulatory Visit: Payer: Self-pay | Admitting: Cardiology

## 2024-04-07 VITALS — BP 115/73 | HR 69 | Ht 72.0 in | Wt 210.0 lb

## 2024-04-07 DIAGNOSIS — I5022 Chronic systolic (congestive) heart failure: Secondary | ICD-10-CM | POA: Diagnosis not present

## 2024-04-07 DIAGNOSIS — I255 Ischemic cardiomyopathy: Secondary | ICD-10-CM

## 2024-04-07 DIAGNOSIS — R0683 Snoring: Secondary | ICD-10-CM

## 2024-04-07 DIAGNOSIS — I5082 Biventricular heart failure: Secondary | ICD-10-CM

## 2024-04-07 DIAGNOSIS — G4734 Idiopathic sleep related nonobstructive alveolar hypoventilation: Secondary | ICD-10-CM

## 2024-04-07 DIAGNOSIS — G4733 Obstructive sleep apnea (adult) (pediatric): Secondary | ICD-10-CM

## 2024-04-07 DIAGNOSIS — G4739 Other sleep apnea: Secondary | ICD-10-CM

## 2024-04-07 DIAGNOSIS — Z72820 Sleep deprivation: Secondary | ICD-10-CM

## 2024-04-07 DIAGNOSIS — I472 Ventricular tachycardia, unspecified: Secondary | ICD-10-CM

## 2024-04-07 DIAGNOSIS — F5104 Psychophysiologic insomnia: Secondary | ICD-10-CM

## 2024-04-07 DIAGNOSIS — G4719 Other hypersomnia: Secondary | ICD-10-CM

## 2024-04-07 LAB — CUP PACEART INCLINIC DEVICE CHECK
Date Time Interrogation Session: 20251229115607
Implantable Lead Connection Status: 753985
Implantable Lead Connection Status: 753985
Implantable Lead Connection Status: 753985
Implantable Lead Implant Date: 20130513
Implantable Lead Implant Date: 20130513
Implantable Lead Implant Date: 20240507
Implantable Lead Location: 753858
Implantable Lead Location: 753859
Implantable Lead Location: 753860
Implantable Lead Model: 181
Implantable Lead Model: 4598
Implantable Lead Model: 5076
Implantable Lead Serial Number: 316784
Implantable Pulse Generator Implant Date: 20240507

## 2024-04-07 NOTE — Patient Instructions (Addendum)
 Medication Instructions:  Your physician has recommended you make the following change in your medication: Stop amiodarone   Lab Work: None ordered.  You may go to any [facility] Location for your lab work:  [facility] - [address] ([facility]) - [address] [phone] [address]  [place] - [address]   [place]  - [address]   [place] - [address] - [address] [place]  - [address] [place] [address] ([facility]   If you have labs (blood work) drawn today and your tests are completely normal, you will receive your results only by: [employer] (if you have MyChart)  If you have any lab test that is abnormal or we need to change your treatment, we will call you or send a MyChart message to review the results.  Testing/Procedures: None ordered.  Follow-Up: At [facility], you and your health needs are our priority.  As part of our continuing mission to provide you with exceptional heart care, we have created designated Provider Care Teams.  These Care Teams include your primary Cardiologist (physician) and Advanced Practice Providers (APPs -  Physician Assistants and Nurse Practitioners) who all work together to provide you with the care you need, when you need it.  Your next appointment:   Feb 2026 for Ablation consult  The format for your next appointment:   In Person  Provider:   Donnice Primus, MD

## 2024-04-07 NOTE — Progress Notes (Signed)
 "     HPI [name] returns today for followup. He has a h/o VT ablation with recurrent VT. He has a long h/o CAD and chronic systolic heart failure s/p biv ICD insertion. The patient has his LV lead turned off due to diaghragmatic stimulation. He has class 2 CHF.  He has not been shocked. He denies syncope. When I saw him his symptoms were more consistent with CHF and he underwent upgrade to a Biv ICD. He has done well in the interim. No additional VT since his implant. He did have his dose of amio increased and c/o dizziness and weakness.  Allergies[1]   Current Outpatient Medications  Medication Sig Dispense Refill   ALPRAZolam  (XANAX ) 0.5 MG tablet Take 1 tablet (0.5 mg total) by mouth at bedtime as needed. (Patient taking differently: Take 0.5 mg by mouth at bedtime.) 2 tablet 0   amiodarone  (PACERONE ) 200 MG tablet Take 2 tablets (400 mg total) by mouth 2 (two) times daily for 7 days, THEN 1 tablet (200 mg total) daily. 90 tablet 0   cyanocobalamin  1000 MCG tablet Take 1 tablet (1,000 mcg total) by mouth daily. 30 tablet 0   escitalopram  (LEXAPRO ) 10 MG tablet Take 10 mg by mouth at bedtime.     fluticasone  (FLONASE ) 50 MCG/ACT nasal spray Place 1 spray into both nostrils daily as needed for allergies or rhinitis.     folic acid  (FOLVITE ) 1 MG tablet Take 1 tablet (1 mg total) by mouth daily. 30 tablet 0   furosemide  (LASIX ) 40 MG tablet Take 1 tablet (40 mg total) by mouth daily. Take additional 40 mg for weight gain of 3 lbs in 1 day or 5 lbs in 1 week or leg swelling. 90 tablet 3   magnesium  chloride (SLOW-MAG) 64 MG TBEC SR tablet Take 1 tablet (64 mg total) by mouth daily. 60 tablet 0   metoprolol  succinate (TOPROL -XL) 50 MG 24 hr tablet Take 1 tablet (50 mg total) by mouth 2 (two) times daily. Take 1 in the morning and 1 at bedtime 180 tablet 2   NITROSTAT  0.4 MG SL tablet PLACE 1 TABLET UNDER THE TONGUE EVERY 5 MINUTES AS NEEDED FOR CHEST PAIN FOR UP TO 3 DOSES. 25 tablet 3    ONETOUCH VERIO test strip 1 each by Other route as needed for other.   6   pantoprazole  (PROTONIX ) 40 MG tablet Take 1 tablet (40 mg total) by mouth daily. 30 tablet 0   Probiotic Product (PROBIOTIC PO) Take 1 tablet by mouth at bedtime.     Rivaroxaban  (XARELTO ) 15 MG TABS tablet Take 1 tablet (15 mg total) by mouth daily with supper.     rosuvastatin  (CRESTOR ) 20 MG tablet Take 20 mg by mouth every Sunday.     spironolactone  (ALDACTONE ) 25 MG tablet Take 1 tablet (25 mg total) by mouth daily. 30 tablet 3   No current facility-administered medications for this visit.     Past Medical History:  Diagnosis Date   AICD (automatic cardioverter/defibrillator) present    Anemia    Arthritis    mild in my joints & knuckles (05/10/2016)   CHF (congestive heart failure) (HCC)    Coronary artery disease    Hemochromatosis    Hyperlipidemia    Ischemic cardiomyopathy    EF 36%; prior anterior MI, s/p CABG x 3; s/p cath Feb 2013 showing grafts to be patent with severe LV dysfunction   Myocardial infarction (HCC) 2006   PAF (paroxysmal atrial  fibrillation) (HCC)    Pneumonia 2000   RBBB (right bundle branch block)    Sleep apnea    can't tolerate mask (05/10/2016)   Type II diabetes mellitus (HCC)    Ventricular tachycardia, 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    ROS:   All systems reviewed and negative except as noted in the HPI.   Past Surgical History:  Procedure Laterality Date   BI-VENTRICULAR IMPLANTABLE CARDIOVERTER DEFIBRILLATOR N/A 08/21/2011   Procedure: BI-VENTRICULAR IMPLANTABLE CARDIOVERTER DEFIBRILLATOR  (CRT-D);  Surgeon: Elspeth JAYSON Sage, MD;  Location: [facility] CATH LAB;  Service: Cardiovascular;  Laterality: N/A;   BIV ICD GENERTAOR CHANGE OUT N/A 06/12/2014   Procedure: BIV ICD GENERTAOR CHANGE OUT;  Surgeon: Elspeth JAYSON Sage, MD;  Location: [facility] CATH LAB;  Service: Cardiovascular;  Laterality: N/A;   BIV ICD INSERTION CRT-D N/A 08/15/2022   Procedure: BIV ICD INSERTION CRT-D;  Surgeon:  Waddell Danelle ORN, MD;  Location: [facility] INVASIVE CV LAB;  Service: Cardiovascular;  Laterality: N/A;   CARDIAC CATHETERIZATION N/A 01/19/2016   Procedure: Left Heart Cath and Cors/Grafts Angiography;  Surgeon: Peter M Jordan, MD;  Location: [facility] INVASIVE CV LAB;  Service: Cardiovascular;  Laterality: N/A;   CARDIOVERSION N/A 03/21/2024   Procedure: CARDIOVERSION;  Surgeon: Rolan Ezra RAMAN, MD;  Location: [facility] INVASIVE CV LAB;  Service: Cardiovascular;  Laterality: N/A;   CORONARY ARTERY BYPASS GRAFT  2006   Emergent CABG x 3 with LIMA to LAD, SVG to OM, SVG to distal RCA per Dr. Army   FRACTURE SURGERY     INGUINAL HERNIA REPAIR  02/09/2012   Procedure: HERNIA REPAIR INGUINAL ADULT;  Surgeon: Lynwood MALVA Pina, MD;  Location: [facility] OR;  Service: General;  Laterality: Right;   INGUINAL HERNIA REPAIR Bilateral [phone]   INSERTION OF MESH  02/09/2012   Procedure: INSERTION OF MESH;  Surgeon: Lynwood MALVA Pina, MD;  Location: [facility] OR;  Service: General;  Laterality: Right;   LEFT HEART CATHETERIZATION WITH CORONARY/GRAFT ANGIOGRAM N/A 05/25/2011   Procedure: LEFT HEART CATHETERIZATION WITH EL BILE;  Surgeon: Peter M Jordan, MD;  Location: [facility] CATH LAB;  Service: Cardiovascular;  Laterality: N/A;   LIVER BIOPSY  1990s   SHOULDER OPEN ROTATOR CUFF REPAIR Bilateral ?2000/~ 2007   left; right    TRANSESOPHAGEAL ECHOCARDIOGRAM (CATH LAB) N/A 03/21/2024   Procedure: TRANSESOPHAGEAL ECHOCARDIOGRAM;  Surgeon: Rolan Ezra RAMAN, MD;  Location: [facility] INVASIVE CV LAB;  Service: Cardiovascular;  Laterality: N/A;   V TACH ABLATION N/A 06/08/2016   Procedure: LULLA Boom Ablation;  Surgeon: Lynwood Rakers, MD;  Location: [facility] INVASIVE CV LAB;  Service: Cardiovascular;  Laterality: N/A;   VENTRICULAR ABLATION SURGERY  06/08/2016   WRIST FRACTURE SURGERY Left 06/2005   plates and screws and cadavaer bone     Family History  Adopted: Yes     Social History   Socioeconomic History   Marital status: Married     Spouse name: stephaine   Number of children: 1   Years of education: Not on file   Highest education level: Not on file  Occupational History   Occupation: telephone service  Tobacco Use   Smoking status: Former    Current packs/day: 0.00    Average packs/day: 2.0 packs/day for 30.0 years (60.0 ttl pk-yrs)    Types: Cigarettes    Start date: 03/12/1975    Quit date: 03/11/2005    Years since quitting: 19.0   Smokeless tobacco: Never  Vaping Use   Vaping status: Never Used  Substance and Sexual Activity  Alcohol use: Yes    Alcohol/week: 3.0 standard drinks of alcohol    Types: 3 Cans of beer per week    Comment: socially   Drug use: No   Sexual activity: Not Currently  Other Topics Concern   Not on file  Social History Narrative   Not on file   Social Drivers of Health   Tobacco Use: Medium Risk (04/07/2024)   Patient History    Smoking Tobacco Use: Former    Smokeless Tobacco Use: Never    Passive Exposure: Not on file  Financial Resource Strain: Low Risk (02/28/2024)   Overall Financial Resource Strain (CARDIA)    Difficulty of Paying Living Expenses: Not very hard  Food Insecurity: No Food Insecurity (02/27/2024)   Epic    Worried About Programme Researcher, Broadcasting/film/video in the Last Year: Never true    Ran Out of Food in the Last Year: Never true  Transportation Needs: No Transportation Needs (02/28/2024)   Epic    Lack of Transportation (Medical): No    Lack of Transportation (Non-Medical): No  Physical Activity: Not on file  Stress: Not on file  Social Connections: Moderately Isolated (02/27/2024)   Social Connection and Isolation Panel    Frequency of Communication with Friends and Family: More than three times a week    Frequency of Social Gatherings with Friends and Family: More than three times a week    Attends Religious Services: Never    Database Administrator or Organizations: No    Attends Banker Meetings: Never    Marital Status: Married  Careers Information Officer Violence: Not At Risk (02/27/2024)   Epic    Fear of Current or Ex-Partner: No    Emotionally Abused: No    Physically Abused: No    Sexually Abused: No  Depression (PHQ2-9): Not on file  Alcohol Screen: Low Risk (02/28/2024)   Alcohol Screen    Last Alcohol Screening Score (AUDIT): 0  Housing: Low Risk (02/27/2024)   Epic    Unable to Pay for Housing in the Last Year: No    Number of Times Moved in the Last Year: 0    Homeless in the Last Year: No  Utilities: Not At Risk (02/27/2024)   Epic    Threatened with loss of utilities: No  Health Literacy: Not on file     BP 115/73   Pulse 69   Ht 6' (1.829 m)   Wt 210 lb (95.3 kg)   SpO2 (!) 87%   BMI 28.48 kg/m   Physical Exam:  Well appearing NAD HEENT: Unremarkable Neck:  No JVD, no thyromegally Lymphatics:  No adenopathy Back:  No CVA tenderness Lungs:  Clear HEART:  Regular rate rhythm, no murmurs, no rubs, no clicks Abd:  soft, positive bowel sounds, no organomegally, no rebound, no guarding Ext:  2 plus pulses, no edema, no cyanosis, no clubbing Skin:  No rashes no nodules Neuro:  CN II through XII intact, motor grossly intact   DEVICE  Normal device function.  See PaceArt for details.   Assess/Plan: Recurrent VT -His VT has quieted down since his Biv upgrade. He is not on any AA drugs. He will undergo watchful waiting. Chronic systolic heart failure - he has class 2 symptoms. He has done well with improvement in his symptoms. His QRS has gone from 196 to 146 with CRT.  CAD, s/p CABG - he is s/p MI but denies any anginal symptoms ICD - his [employer] Biv  ICD is working normally with over 9 years of battery longevity. Dizziness - I have asked him to reduce the amiodarone  to 200 mg daily. We will follow.   Danelle Belvie Iribe,MD        [1]  Allergies Allergen Reactions   Apixaban  Hives, Itching, Swelling and Rash   Entresto  [Sacubitril -Valsartan ] Other (See Comments)    Hypotension, increased heart  rate, dizziness, blurry vision   Linagliptin-Metformin  Hcl     chest pain, GI upset   Amphotericin B Lipid Complex [Amphotericin B]     MYALGIAS   Vytorin [Ezetimibe -Simvastatin]     MYALGIAS   Evolocumab  Other (See Comments)     Elevated BS Repatha    Statins Other (See Comments)    Myalgias    "

## 2024-04-09 ENCOUNTER — Encounter (HOSPITAL_COMMUNITY)
Admission: RE | Disposition: A | Discharge status: Home / Self Care | Payer: Self-pay | Source: Home / Self Care | Attending: Cardiology

## 2024-04-09 ENCOUNTER — Encounter (HOSPITAL_COMMUNITY): Payer: Self-pay | Admitting: Cardiology

## 2024-04-09 ENCOUNTER — Ambulatory Visit (HOSPITAL_COMMUNITY)
Admission: RE | Admit: 2024-04-09 | Discharge: 2024-04-09 | Disposition: A | Discharge status: Home / Self Care | Attending: Cardiology | Admitting: Cardiology

## 2024-04-09 DIAGNOSIS — I255 Ischemic cardiomyopathy: Secondary | ICD-10-CM | POA: Insufficient documentation

## 2024-04-09 DIAGNOSIS — I480 Paroxysmal atrial fibrillation: Secondary | ICD-10-CM | POA: Insufficient documentation

## 2024-04-09 DIAGNOSIS — I252 Old myocardial infarction: Secondary | ICD-10-CM | POA: Insufficient documentation

## 2024-04-09 DIAGNOSIS — Z955 Presence of coronary angioplasty implant and graft: Secondary | ICD-10-CM | POA: Insufficient documentation

## 2024-04-09 DIAGNOSIS — I472 Ventricular tachycardia, unspecified: Secondary | ICD-10-CM | POA: Insufficient documentation

## 2024-04-09 DIAGNOSIS — E785 Hyperlipidemia, unspecified: Secondary | ICD-10-CM | POA: Insufficient documentation

## 2024-04-09 DIAGNOSIS — Z79899 Other long term (current) drug therapy: Secondary | ICD-10-CM | POA: Diagnosis not present

## 2024-04-09 DIAGNOSIS — I272 Pulmonary hypertension, unspecified: Secondary | ICD-10-CM | POA: Insufficient documentation

## 2024-04-09 DIAGNOSIS — Z95810 Presence of automatic (implantable) cardiac defibrillator: Secondary | ICD-10-CM | POA: Diagnosis not present

## 2024-04-09 DIAGNOSIS — E1122 Type 2 diabetes mellitus with diabetic chronic kidney disease: Secondary | ICD-10-CM | POA: Diagnosis not present

## 2024-04-09 DIAGNOSIS — N184 Chronic kidney disease, stage 4 (severe): Secondary | ICD-10-CM | POA: Diagnosis not present

## 2024-04-09 DIAGNOSIS — G4730 Sleep apnea, unspecified: Secondary | ICD-10-CM | POA: Diagnosis not present

## 2024-04-09 DIAGNOSIS — Z951 Presence of aortocoronary bypass graft: Secondary | ICD-10-CM | POA: Insufficient documentation

## 2024-04-09 DIAGNOSIS — Z87891 Personal history of nicotine dependence: Secondary | ICD-10-CM | POA: Diagnosis not present

## 2024-04-09 DIAGNOSIS — I5022 Chronic systolic (congestive) heart failure: Secondary | ICD-10-CM | POA: Diagnosis present

## 2024-04-09 DIAGNOSIS — I2510 Atherosclerotic heart disease of native coronary artery without angina pectoris: Secondary | ICD-10-CM | POA: Diagnosis not present

## 2024-04-09 DIAGNOSIS — Z7901 Long term (current) use of anticoagulants: Secondary | ICD-10-CM | POA: Diagnosis not present

## 2024-04-09 HISTORY — PX: RIGHT HEART CATH: CATH118263

## 2024-04-09 LAB — POCT I-STAT EG7
Acid-Base Excess: 1 mmol/L (ref 0.0–2.0)
Acid-Base Excess: 1 mmol/L (ref 0.0–2.0)
Bicarbonate: 25.9 mmol/L (ref 20.0–28.0)
Bicarbonate: 26.1 mmol/L (ref 20.0–28.0)
Calcium, Ion: 1.22 mmol/L (ref 1.15–1.40)
Calcium, Ion: 1.22 mmol/L (ref 1.15–1.40)
HCT: 29 % — ABNORMAL LOW (ref 39.0–52.0)
HCT: 29 % — ABNORMAL LOW (ref 39.0–52.0)
Hemoglobin: 9.9 g/dL — ABNORMAL LOW (ref 13.0–17.0)
Hemoglobin: 9.9 g/dL — ABNORMAL LOW (ref 13.0–17.0)
O2 Saturation: 55 %
O2 Saturation: 56 %
Potassium: 3 mmol/L — ABNORMAL LOW (ref 3.5–5.1)
Potassium: 3 mmol/L — ABNORMAL LOW (ref 3.5–5.1)
Sodium: 139 mmol/L (ref 135–145)
Sodium: 139 mmol/L (ref 135–145)
TCO2: 27 mmol/L (ref 22–32)
TCO2: 27 mmol/L (ref 22–32)
pCO2, Ven: 40.2 mmHg — ABNORMAL LOW (ref 44–60)
pCO2, Ven: 40.4 mmHg — ABNORMAL LOW (ref 44–60)
pH, Ven: 7.417 (ref 7.25–7.43)
pH, Ven: 7.418 (ref 7.25–7.43)
pO2, Ven: 28 mmHg — CL (ref 32–45)
pO2, Ven: 29 mmHg — CL (ref 32–45)

## 2024-04-09 LAB — BASIC METABOLIC PANEL WITH GFR
Anion gap: 10 (ref 5–15)
BUN: 24 mg/dL — ABNORMAL HIGH (ref 8–23)
CO2: 28 mmol/L (ref 22–32)
Calcium: 8.8 mg/dL — ABNORMAL LOW (ref 8.9–10.3)
Chloride: 100 mmol/L (ref 98–111)
Creatinine, Ser: 1.63 mg/dL — ABNORMAL HIGH (ref 0.61–1.24)
GFR, Estimated: 46 mL/min — ABNORMAL LOW
Glucose, Bld: 162 mg/dL — ABNORMAL HIGH (ref 70–99)
Potassium: 3 mmol/L — ABNORMAL LOW (ref 3.5–5.1)
Sodium: 138 mmol/L (ref 135–145)

## 2024-04-09 LAB — GLUCOSE, CAPILLARY
Glucose-Capillary: 325 mg/dL — ABNORMAL HIGH (ref 70–99)
Glucose-Capillary: 237 mg/dL — ABNORMAL HIGH (ref 70–99)
Glucose-Capillary: 162 mg/dL — ABNORMAL HIGH (ref 70–99)

## 2024-04-09 SURGERY — RIGHT HEART CATH
Anesthesia: LOCAL

## 2024-04-09 MED ORDER — SODIUM CHLORIDE 0.9% FLUSH: 3.0000 mL | INTRAVENOUS | Status: DC | PRN

## 2024-04-09 MED ORDER — INSULIN ASPART 100 UNIT/ML IJ SOLN
INTRAMUSCULAR | Status: AC
  Filled 2024-04-09: qty 10

## 2024-04-09 MED ORDER — SODIUM CHLORIDE 0.9 % IV SOLN: 250.0000 mL | INTRAVENOUS | Status: DC | PRN

## 2024-04-09 MED ORDER — HYDRALAZINE HCL 20 MG/ML IJ SOLN: 10.0000 mg | INTRAMUSCULAR | Status: DC | PRN

## 2024-04-09 MED ORDER — HEPARIN (PORCINE) IN NACL 1000-0.9 UT/500ML-% IV SOLN
INTRAVENOUS | Status: DC | PRN
  Administered 2024-04-09: 500 mL

## 2024-04-09 MED ORDER — SODIUM CHLORIDE 0.9% FLUSH: 3.0000 mL | Freq: Two times a day (BID) | INTRAVENOUS | Status: DC

## 2024-04-09 MED ORDER — FREE WATER
250.0000 mL | Freq: Once | Status: AC
  Administered 2024-04-09: 250 mL via ORAL

## 2024-04-09 MED ORDER — LABETALOL HCL 5 MG/ML IV SOLN: 10.0000 mg | INTRAVENOUS | Status: DC | PRN

## 2024-04-09 MED ORDER — FUROSEMIDE 40 MG PO TABS: 60.0000 mg | ORAL_TABLET | Freq: Two times a day (BID) | ORAL | 3 refills | Status: DC

## 2024-04-09 MED ORDER — POTASSIUM CHLORIDE CRYS ER 20 MEQ PO TBCR: 40.0000 meq | EXTENDED_RELEASE_TABLET | Freq: Every day | ORAL | 3 refills | Status: DC

## 2024-04-09 MED ORDER — ACETAMINOPHEN 325 MG PO TABS: 650.0000 mg | ORAL_TABLET | ORAL | Status: DC | PRN

## 2024-04-09 MED ORDER — ASPIRIN 81 MG PO CHEW
81.0000 mg | CHEWABLE_TABLET | Freq: Once | ORAL | Status: AC
  Administered 2024-04-09: 81 mg via ORAL
  Filled 2024-04-09: qty 1

## 2024-04-09 MED ORDER — LIDOCAINE HCL (PF) 1 % IJ SOLN
INTRAMUSCULAR | Status: AC
  Filled 2024-04-09: qty 30

## 2024-04-09 MED ORDER — ONDANSETRON HCL 4 MG/2ML IJ SOLN: 4.0000 mg | Freq: Four times a day (QID) | INTRAMUSCULAR | Status: DC | PRN

## 2024-04-09 MED ORDER — FREE WATER: 250.0000 mL | Freq: Once | Status: DC

## 2024-04-09 MED ORDER — LIDOCAINE HCL (PF) 1 % IJ SOLN
INTRAMUSCULAR | Status: DC | PRN
  Administered 2024-04-09: 5 mL via INTRADERMAL

## 2024-04-09 MED ORDER — INSULIN ASPART 100 UNIT/ML IJ SOLN
10.0000 [IU] | Freq: Once | INTRAMUSCULAR | Status: AC
  Administered 2024-04-09: 10 [IU] via SUBCUTANEOUS

## 2024-04-09 SURGICAL SUPPLY — 7 items
CATH SWAN GANZ 7F STRAIGHT (CATHETERS) IMPLANT
GLIDESHEATH SLENDER 7FR .021G (SHEATH) IMPLANT
GUIDEWIRE .025 260CM (WIRE) IMPLANT
PACK CARDIAC CATHETERIZATION (CUSTOM PROCEDURE TRAY) ×1 IMPLANT
SHEATH GLIDE SLENDER 4/5FR (SHEATH) IMPLANT
TRANSDUCER W/STOPCOCK (MISCELLANEOUS) IMPLANT
TUBING ART PRESS 72 MALE/FEM (TUBING) IMPLANT

## 2024-04-09 NOTE — Interval H&P Note (Signed)
 History and Physical Interval Note:  04/09/2024 7:52 AM  [name]  has presented today for surgery, with the diagnosis of hf.  The various methods of treatment have been discussed with the patient and family. After consideration of risks, benefits and other options for treatment, the patient has consented to  Procedures: RIGHT HEART CATH (N/A) as a surgical intervention.  The patient's history has been reviewed, patient examined, no change in status, stable for surgery.  I have reviewed the patient's chart and labs.  Questions were answered to the patient's satisfaction.     [name] [employer]

## 2024-04-09 NOTE — Progress Notes (Signed)
 Notified Dr Rolan that patient took xarelto  yesterday @ 1000.  He states patient had cardioversion 2 weeks ago.  Since Xarelto  on board will only do Right Heart.

## 2024-04-09 NOTE — Discharge Instructions (Addendum)
 1. Increase Lasix  (furosemide ) to 60 mg twice daily (1.5 40 mg tabs twice daily). 2. Start potassium 40 mEq daily.  3. Take your Xarelto  as you normally would today.   Brachial Site Care   This sheet gives you information about how to care for yourself after your procedure. Your health care provider may also give you more specific instructions. If you have problems or questions, contact your health care provider. What can I expect after the procedure? After the procedure, it is common to have: Bruising and tenderness at the catheter insertion area. Follow these instructions at home:  Insertion site care Follow instructions from your health care provider about how to take care of your insertion site. Make sure you: Wash your hands with soap and water before you change your bandage (dressing). If soap and water are not available, use hand sanitizer. Remove your dressing as told by your health care provider. In 24 hours Check your insertion site every day for signs of infection. Check for: Redness, swelling, or pain. Pus or a bad smell. Warmth. You may shower 24-48 hours after the procedure. Do not apply powder or lotion to the site.  Activity For 24 hours after the procedure, or as directed by your health care provider: Do not push or pull heavy objects with the affected arm. Do not drive yourself home from the hospital or clinic. You may drive 24 hours after the procedure unless your health care provider tells you not to. Do not lift anything that is heavier than 10 lb (4.5 kg), or the limit that you are told, until your health care provider says that it is safe.  For 24 hours

## 2024-04-11 NOTE — Progress Notes (Signed)
 "  Advanced Heart Failure Consult Note  Shepard Ade, MD  Cardiologist: Dr. Jordan EP: Dr. Waddell HF: Dr. Rolan  HPI: [name] is a 69 y.o. male with history of CAD w/ septal MI s/p CABG X 3 in 2006 (patent grafts on [facility] 2017), ICM/HFrEF (EF 25-30% range) s/p CRT-D, hx VT ablation in 2018 on sotalol , HLD with statin intolerance, DM II, sleep apnea awaiting CPAP titration, hemochromatosis previously followed by Dr. Timmy, prior ETOH and tobacco abuse. Afib is on his problem list but do not see that this has been documented by EP.  Admitted in 11/25 with acute on chronic CHF and AKI on CKD IV. Scr up to 4 (baseline ~2). He was seen by Cardiology. Diuresed with IV lasix . Lactic acid 2.0>0.8. Had PICC placed, Co-ox consistently > 60%. Home coreg  changed to toprol  XL. GDMT limited by CKD and blood pressure. Sotalol  discontinued due to renal dysfunction. PTA lisinopril  and spironolactone  also discontinued. Echo during admit with EF 30%, LVIDd 6.5 cm, RV okay, RVSP 48 mmHg, mild MR, dilated IVC.  Device clinic received alert 03/11/24 regarding AF/AFL in progress starting this am. OptiVol began trending up in 10/25, markedly elevated > 200 since early November but thoracic impedance now trending back up to baseline. 10 NSVT episodes.  Seen for initial TOC post-hospital follow up 03/11/24, found to be in new AFL, started on Eliquis  & underwent TEE/DCCV to NSR, TEE showed LVEF 30-35%, mildly dilated RV, no LAA thrombus, no PFO/ASD, negative bubble study, mild to moderate MR. RHC arranged with concern for low output.   Underwent RHC 12/25 showing significantly elevated PCWP (24), moderate pulmonary venous hypertension, R heart pressures not markedly elevated and preserved CO. Last was increased to 60 bid. No coronary angiography in absence of CP and only 2 weeks out from DCCV and do not want to interrupt AC.  Today he presents for HF follow up, referred from [facility]. Overall feeling fine. He has SOB  walking up steps, or carrying heavy loads. Feet swell occasionally. Occasional positional dizziness, no falls or syncope. Denies palpitations, abnormal bleeding, CP, dizziness, or PND/Orthopnea. Appetite ok. Weight at home 196 pounds. Taking all medications. Awaiting CPAP. Of note, he continued lisinopril  10 mg daily since his discharge.  ECG (personally reviewed): AV paced 64 bpm  Device interrogation (personally reviewed): OptiVol ok, no AF/AFL since DCCV 12/25, 3.3 hr/day activity, no VT  Labs (12/25): K 3.0, creatinine 1.63  Cardiac Studies - RHC 12/25: RA 7, PA 56/20 (36), PCWP 24, CO/CI (Fick) 5.37/2.51, PVR 2.2 WU, PAPi 5.1 - TEE/DCCV 12/25: LVEF 30-35%, mildly dilated RV, no LAA thrombus, no PFO/ASD, negative bubble study, mild to moderate MR; successful conversion to NSR - Echo 11/25: EF 30%, RV ok, IVC dilated.   Past Medical History:  Diagnosis Date   AICD (automatic cardioverter/defibrillator) present    Anemia    Arthritis    mild in my joints & knuckles (05/10/2016)   CHF (congestive heart failure) (HCC)    Coronary artery disease    Hemochromatosis    Hyperlipidemia    Ischemic cardiomyopathy    EF 36%; prior anterior MI, s/p CABG x 3; s/p cath Feb 2013 showing grafts to be patent with severe LV dysfunction   Myocardial infarction (HCC) 2006   PAF (paroxysmal atrial fibrillation) (HCC)    Pneumonia 2000   RBBB (right bundle branch block)    Sleep apnea    can't tolerate mask (05/10/2016)   Type II diabetes mellitus (HCC)  Ventricular tachycardia, 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   Current Outpatient Medications  Medication Sig Dispense Refill   ALPRAZolam  (XANAX ) 0.5 MG tablet Take 1 tablet (0.5 mg total) by mouth at bedtime as needed. 2 tablet 0   cyanocobalamin  1000 MCG tablet Take 1 tablet (1,000 mcg total) by mouth daily. 30 tablet 0   escitalopram  (LEXAPRO ) 10 MG tablet Take 10 mg by mouth at bedtime.     fluticasone  (FLONASE ) 50 MCG/ACT nasal spray Place 1  spray into both nostrils daily as needed for allergies or rhinitis.     folic acid  (FOLVITE ) 1 MG tablet Take 1 tablet (1 mg total) by mouth daily. 30 tablet 0   furosemide  (LASIX ) 40 MG tablet Take 1.5 tablets (60 mg total) by mouth 2 (two) times daily. Take additional 40 mg for weight gain of 3 lbs in 1 day or 5 lbs in 1 week or leg swelling. 90 tablet 3   magnesium  chloride (SLOW-MAG) 64 MG TBEC SR tablet Take 1 tablet (64 mg total) by mouth daily. 60 tablet 0   metoprolol  succinate (TOPROL -XL) 50 MG 24 hr tablet Take 1 tablet (50 mg total) by mouth 2 (two) times daily. Take 1 in the morning and 1 at bedtime 180 tablet 2   NITROSTAT  0.4 MG SL tablet PLACE 1 TABLET UNDER THE TONGUE EVERY 5 MINUTES AS NEEDED FOR CHEST PAIN FOR UP TO 3 DOSES. 25 tablet 3   ONETOUCH VERIO test strip 1 each by Other route as needed for other.   6   pantoprazole  (PROTONIX ) 40 MG tablet Take 1 tablet (40 mg total) by mouth daily. 30 tablet 0   potassium chloride  SA (KLOR-CON  M) 20 MEQ tablet Take 2 tablets (40 mEq total) by mouth daily. 90 tablet 3   Probiotic Product (PROBIOTIC PO) Take 1 tablet by mouth at bedtime.     Rivaroxaban  (XARELTO ) 15 MG TABS tablet Take 1 tablet (15 mg total) by mouth daily with supper.     rosuvastatin  (CRESTOR ) 20 MG tablet Take 20 mg by mouth every Sunday.     spironolactone  (ALDACTONE ) 25 MG tablet Take 1 tablet (25 mg total) by mouth daily. 30 tablet 3   No current facility-administered medications for this encounter.   Allergies  Allergen Reactions   Apixaban  Hives, Itching, Swelling and Rash   Entresto  [Sacubitril -Valsartan ] Other (See Comments)    Hypotension, increased heart rate, dizziness, blurry vision   Linagliptin-Metformin  Hcl     chest pain, GI upset   Amphotericin B Lipid Complex [Amphotericin B]     MYALGIAS   Vytorin [Ezetimibe -Simvastatin]     MYALGIAS   Evolocumab  Other (See Comments)     Elevated BS Repatha    Statins Other (See Comments)    Myalgias     Social History   Socioeconomic History   Marital status: Married    Spouse name: stephaine   Number of children: 1   Years of education: Not on file   Highest education level: Not on file  Occupational History   Occupation: telephone service  Tobacco Use   Smoking status: Former    Current packs/day: 0.00    Average packs/day: 2.0 packs/day for 30.0 years (60.0 ttl pk-yrs)    Types: Cigarettes    Start date: 03/12/1975    Quit date: 03/11/2005    Years since quitting: 19.1   Smokeless tobacco: Never  Vaping Use   Vaping status: Never Used  Substance and Sexual Activity   Alcohol use: Yes  Alcohol/week: 3.0 standard drinks of alcohol    Types: 3 Cans of beer per week    Comment: socially   Drug use: No   Sexual activity: Not Currently  Other Topics Concern   Not on file  Social History Narrative   Not on file   Social Drivers of Health   Tobacco Use: Medium Risk (04/07/2024)   Patient History    Smoking Tobacco Use: Former    Smokeless Tobacco Use: Never    Passive Exposure: Not on file  Financial Resource Strain: Low Risk (02/28/2024)   Overall Financial Resource Strain (CARDIA)    Difficulty of Paying Living Expenses: Not very hard  Food Insecurity: No Food Insecurity (02/27/2024)   Epic    Worried About Programme Researcher, Broadcasting/film/video in the Last Year: Never true    Ran Out of Food in the Last Year: Never true  Transportation Needs: No Transportation Needs (02/28/2024)   Epic    Lack of Transportation (Medical): No    Lack of Transportation (Non-Medical): No  Physical Activity: Not on file  Stress: Not on file  Social Connections: Moderately Isolated (02/27/2024)   Social Connection and Isolation Panel    Frequency of Communication with Friends and Family: More than three times a week    Frequency of Social Gatherings with Friends and Family: More than three times a week    Attends Religious Services: Never    Database Administrator or Organizations: No    Attends  Banker Meetings: Never    Marital Status: Married  Catering Manager Violence: Not At Risk (02/27/2024)   Epic    Fear of Current or Ex-Partner: No    Emotionally Abused: No    Physically Abused: No    Sexually Abused: No  Depression (PHQ2-9): Not on file  Alcohol Screen: Low Risk (02/28/2024)   Alcohol Screen    Last Alcohol Screening Score (AUDIT): 0  Housing: Low Risk (02/27/2024)   Epic    Unable to Pay for Housing in the Last Year: No    Number of Times Moved in the Last Year: 0    Homeless in the Last Year: No  Utilities: Not At Risk (02/27/2024)   Epic    Threatened with loss of utilities: No  Health Literacy: Not on file   Family History  Adopted: Yes   Wt Readings from Last 3 Encounters:  04/14/24 93.3 kg (205 lb 9.6 oz)  04/09/24 92.1 kg (203 lb)  04/07/24 95.3 kg (210 lb)   BP 102/68   Pulse 78   Wt 93.3 kg (205 lb 9.6 oz)   SpO2 96%   BMI 27.88 kg/m   PHYSICAL EXAM: General:  NAD. No resp difficulty, walked into clinic HEENT: Normal Neck: Supple. No JVD. Cor: Regular rate & rhythm. No rubs, gallops or murmurs. Lungs: Clear Abdomen: Soft, nontender, nondistended.  Extremities: No cyanosis, clubbing, rash, edema Neuro: Alert & oriented x 3, moves all 4 extremities w/o difficulty. Affect pleasant. + mild hand and BUE tremor  ASSESSMENT & PLAN: 1. Chronic Systolic Heart Failure: ICM. EF has been in 25-30% range. Echo 11/25: EF 30%, LVIDd 6.5 cm, RV okay, RVSP 48 mmHg, mild MR, dilated IVC. Has BiV ICD. Concern he may be nearing end-stage especially with more recent trouble tolerating GDMT and slowly worsening renal function. RHC 12/25 showed markedly elevated PCWP, preserved CI & PAPi. He states he would want to consider advanced therapies such as LVAD if felt to be a  candidate. Today, improved NYHA II, he is not volume overloaded on exam or device interrogation. - Start Farxiga  10 mg daily. BMET/BNP today. - Decrease Lasix  to 60 mg daily,  decrease KCL to 20 daily. - Continue spiro 12.5 mg daily. - Continue Toprol  XL 50 mg bid - Continue lisinopril  10 mg daily. - Will ask device RN to send transmission in 1 week. - Discussed importance of dedicated exercise routine 2. VT: Hx VT ablation in 2018. Previously had tremor with amiodarone  and stopped 2 weeks ago. Off sotalol  d/t CKD. Last received 4 ATP within 24 hrs for recurrent VT in 7/25. No recent VT on device interrogation today. - Follows with EP 3. 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 S/p TEE/DCCV 12/25 to NSR. AV paced on ECG today. - Continue Xarelto  15 mg daily. CBC today. - Follows with EP & AF clinic 4. CAD: Hx septal MI in 2006, s/p CABG X 3.Patent bypass grafts on cath in 2018. No chest pain. - Off ASA with need for AC  - Continue rosuvastatin  20 mg once a week. Hx mylagias with statins. Stopped PCSK-9i inhibitor in the past d/t hyperglycemia 5. CKD IIIb/IV: Scr slowly worsening over last couple of years, baseline ~1.6-2 - Start SGLT2i today. BMET today - Refer to Nephrology. 6. OSA: Moderate to severe noted on recent sleep study with Neuro - Awaiting CPAP.  Follow up in 2 months with Dr. Rolan. Consider CPX testing down the road.  Harlene Gainer, FNP-BC 04/14/2024 "

## 2024-04-14 ENCOUNTER — Ambulatory Visit (HOSPITAL_COMMUNITY): Payer: Self-pay | Admitting: Family Medicine

## 2024-04-14 ENCOUNTER — Telehealth (HOSPITAL_COMMUNITY): Payer: Self-pay

## 2024-04-14 ENCOUNTER — Other Ambulatory Visit (HOSPITAL_COMMUNITY): Payer: Self-pay

## 2024-04-14 ENCOUNTER — Encounter (HOSPITAL_COMMUNITY): Payer: Self-pay

## 2024-04-14 ENCOUNTER — Ambulatory Visit (HOSPITAL_COMMUNITY)
Admission: RE | Admit: 2024-04-14 | Discharge: 2024-04-14 | Disposition: A | Discharge status: Home / Self Care | Source: Ambulatory Visit | Attending: Family Medicine | Admitting: Family Medicine

## 2024-04-14 VITALS — BP 102/68 | HR 78 | Wt 205.6 lb

## 2024-04-14 DIAGNOSIS — Z7901 Long term (current) use of anticoagulants: Secondary | ICD-10-CM | POA: Diagnosis not present

## 2024-04-14 DIAGNOSIS — I4892 Unspecified atrial flutter: Secondary | ICD-10-CM | POA: Diagnosis not present

## 2024-04-14 DIAGNOSIS — I255 Ischemic cardiomyopathy: Secondary | ICD-10-CM | POA: Diagnosis not present

## 2024-04-14 DIAGNOSIS — G4733 Obstructive sleep apnea (adult) (pediatric): Secondary | ICD-10-CM | POA: Diagnosis not present

## 2024-04-14 DIAGNOSIS — N183 Chronic kidney disease, stage 3 unspecified: Secondary | ICD-10-CM

## 2024-04-14 DIAGNOSIS — Z79899 Other long term (current) drug therapy: Secondary | ICD-10-CM | POA: Insufficient documentation

## 2024-04-14 DIAGNOSIS — I272 Pulmonary hypertension, unspecified: Secondary | ICD-10-CM | POA: Diagnosis not present

## 2024-04-14 DIAGNOSIS — I493 Ventricular premature depolarization: Secondary | ICD-10-CM | POA: Diagnosis not present

## 2024-04-14 DIAGNOSIS — I472 Ventricular tachycardia, unspecified: Secondary | ICD-10-CM | POA: Insufficient documentation

## 2024-04-14 DIAGNOSIS — Z87891 Personal history of nicotine dependence: Secondary | ICD-10-CM | POA: Diagnosis not present

## 2024-04-14 DIAGNOSIS — E1122 Type 2 diabetes mellitus with diabetic chronic kidney disease: Secondary | ICD-10-CM | POA: Insufficient documentation

## 2024-04-14 DIAGNOSIS — I2510 Atherosclerotic heart disease of native coronary artery without angina pectoris: Secondary | ICD-10-CM | POA: Insufficient documentation

## 2024-04-14 DIAGNOSIS — N184 Chronic kidney disease, stage 4 (severe): Secondary | ICD-10-CM | POA: Diagnosis not present

## 2024-04-14 DIAGNOSIS — E785 Hyperlipidemia, unspecified: Secondary | ICD-10-CM | POA: Diagnosis not present

## 2024-04-14 DIAGNOSIS — Z951 Presence of aortocoronary bypass graft: Secondary | ICD-10-CM | POA: Insufficient documentation

## 2024-04-14 DIAGNOSIS — I252 Old myocardial infarction: Secondary | ICD-10-CM | POA: Diagnosis not present

## 2024-04-14 DIAGNOSIS — I5022 Chronic systolic (congestive) heart failure: Secondary | ICD-10-CM | POA: Insufficient documentation

## 2024-04-14 DIAGNOSIS — Z95810 Presence of automatic (implantable) cardiac defibrillator: Secondary | ICD-10-CM | POA: Diagnosis not present

## 2024-04-14 LAB — CBC
HCT: 35 % — ABNORMAL LOW (ref 39.0–52.0)
Hemoglobin: 12.1 g/dL — ABNORMAL LOW (ref 13.0–17.0)
MCH: 34.8 pg — ABNORMAL HIGH (ref 26.0–34.0)
MCHC: 34.6 g/dL (ref 30.0–36.0)
MCV: 100.6 fL — ABNORMAL HIGH (ref 80.0–100.0)
Platelets: 199 K/uL (ref 150–400)
RBC: 3.48 MIL/uL — ABNORMAL LOW (ref 4.22–5.81)
RDW: 14.5 % (ref 11.5–15.5)
WBC: 6.6 K/uL (ref 4.0–10.5)
nRBC: 0 % (ref 0.0–0.2)

## 2024-04-14 LAB — BASIC METABOLIC PANEL WITH GFR
Anion gap: 11 (ref 5–15)
BUN: 25 mg/dL — ABNORMAL HIGH (ref 8–23)
CO2: 28 mmol/L (ref 22–32)
Calcium: 9.7 mg/dL (ref 8.9–10.3)
Chloride: 97 mmol/L — ABNORMAL LOW (ref 98–111)
Creatinine, Ser: 1.79 mg/dL — ABNORMAL HIGH (ref 0.61–1.24)
GFR, Estimated: 41 mL/min — ABNORMAL LOW
Glucose, Bld: 314 mg/dL — ABNORMAL HIGH (ref 70–99)
Potassium: 4.5 mmol/L (ref 3.5–5.1)
Sodium: 136 mmol/L (ref 135–145)

## 2024-04-14 LAB — PRO BRAIN NATRIURETIC PEPTIDE: Pro Brain Natriuretic Peptide: 707 pg/mL — ABNORMAL HIGH

## 2024-04-14 MED ORDER — POTASSIUM CHLORIDE CRYS ER 20 MEQ PO TBCR: 20.0000 meq | EXTENDED_RELEASE_TABLET | Freq: Every day | ORAL | 3 refills | Status: DC

## 2024-04-14 MED ORDER — FUROSEMIDE 40 MG PO TABS: 60.0000 mg | ORAL_TABLET | Freq: Every day | ORAL | 3 refills | Status: DC

## 2024-04-14 NOTE — Patient Instructions (Signed)
 START Farxiga  10 mg daily.  CHANGE Lasix  to 60 mg daily.  CHANGE Potassium to 20 mEq ( 1 Tab) daily.  Labs done today, your results will be available in MyChart, we will contact you for abnormal readings.  You have been referred to [facility]. They will call you to arrange your appointment.  Your physician recommends that you schedule a follow-up appointment in: 2 months. ** PLEASE CALL THE OFFICE IN 3 WEEKS TO ARRANGE YOUR FOLLOW UP APPOINTMENT.**  If you have any questions or concerns before your next appointment please send us  a message through [place] or call our office at [phone].    TO LEAVE A MESSAGE FOR THE NURSE SELECT OPTION 2, PLEASE LEAVE A MESSAGE INCLUDING: YOUR NAME DATE OF BIRTH CALL BACK NUMBER REASON FOR CALL**this is important as we prioritize the call backs  YOU WILL RECEIVE A CALL BACK THE SAME DAY AS LONG AS YOU CALL BEFORE 4:00 PM  At the [facility] Failure Clinic, you and your health needs are our priority. As part of our continuing mission to provide you with exceptional heart care, we have created designated Provider Care Teams. These Care Teams include your primary Cardiologist (physician) and Advanced Practice Providers (APPs- Physician Assistants and Nurse Practitioners) who all work together to provide you with the care you need, when you need it.   You may see any of the following providers on your designated Care Team at your next follow up: Dr Toribio Fuel Dr Ezra Shuck Dr. Morene Brownie Greig Mosses, NP Caffie Shed, [place] [facility] [place], [place] Beckey Coe, NP Jordan Lee, NP Ellouise Class, NP Tinnie Redman, PharmD Jaun Bash, PharmD   Please be sure to bring in all your medications bottles to every appointment.    Thank you for choosing [facility]

## 2024-04-14 NOTE — Telephone Encounter (Signed)
 Referral faxed to [facility] on 04/14/24

## 2024-04-14 NOTE — Progress Notes (Signed)
 Medication Samples have been provided to the patient.  Drug name: Farxiga        Strength: 10 mg        Qty: 4 boxes  LOT: [id number]  Exp.Date: 01/07/26  Dosing instructions: take 1 tablet daily.  The patient has been instructed regarding the correct time, dose, and frequency of taking this medication, including desired effects and most common side effects.   Zoeya Gramajo M Kamylle Axelson 10:34 AM 04/14/2024

## 2024-04-14 NOTE — Telephone Encounter (Signed)
 Advanced Heart Failure Patient Advocate Encounter  The patient was approved for a [employer] grant that will help cover the cost of Farxiga , Metoprolol , Spironolactone , Xarelto .  Total amount awarded, $7,500.  Effective: 03/15/2024 - 03/14/2025.  BIN [id number] PCN [id number] Group [id number] ID [id number]  Patient informed via voicemail and [employer]PS.  Rachel DEL, CPhT Rx Patient Advocate Phone: [phone]

## 2024-04-16 ENCOUNTER — Ambulatory Visit: Payer: Self-pay | Admitting: Neurology

## 2024-04-16 DIAGNOSIS — G4739 Other sleep apnea: Secondary | ICD-10-CM | POA: Insufficient documentation

## 2024-04-16 NOTE — Procedures (Signed)
 "  [facility] PAP TITRATION INTERPRETATION REPORT   STUDY DATE: 04/07/2024      PATIENT NAME:  [name]         DATE OF BIRTH:  20-Oct-1955  PATIENT ID:  [id number]    TYPE OF STUDY:  CPAP  READING PHYSICIAN: Colton GORES, MD SCORING TECHNICIAN: Donnice Counts, RPSGT   DR Johnnette, MD  Colton [name] main concern was his insomnia, situational- he is very anxious about his wife's health, his wife has reported loud snorting and witnessed apneas. He is sleep deprived, estimating only 3-5 hours of nocturnal sleep. He falls asleep in daytime as soon as he is not stimulated or physically active Risk factors for OSA were present, including : Body mass index is 27.94 kg/m., 20 neck size and overbite, retrognathia , upper airway anatomy. He lost weight , from 248 to 200 lbs. 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  Upon review of his referral I saw that he has chronic dyspnea for which he is followed by cardiology : he has a systolic murmur and he is known to rely on Lasix  to prevent fluid accumulation. However a twice daily dosing of Lasix  had led to dehydration and rising creatinine. His management of chronic conditions is done at the office of Dr. Shepard, he also is on carvedilol  and lisinopril  he takes magnesium  which helps with muscle cramps, and aspirin  daily baby size, sotalol  for his irregular heartbeats and he is on a generic form of Lexapro  5 mg only. This was meant to help with the situational anxiety but has not affected his sleep yet . He is followed for hemochromatosis by Dr. Timmy,  he is s/p CABG, his creatinine on 8-19 2025 was 3.1 which is a critical elevation his BUN at the time was 68 clearly stating that he was dehydrated. This would be related to an acute kidney injury.  Daytime somnolence has been a chronic issue but we are looking for  organic causes ruling out obstructive sleep apnea snoring, congestive heart failure also places this patient at a higher risk of central sleep apnea and for this reason, I won't order a home sleep test but an in-lab sleep study. It does help to provide a effective sleep aid to our patients and I will offer them [name] 0.5 mg of Xanax  2 tablets to have a sleep aid. This 69 year-old Male returns for CPAP titration under the use of a sleep aid.His PSG from 04-21-2023 revealed an AHI of moderate -severity, OSA.  [name] The Epworth Sleepiness Scale was 12 out of 24 (scores above or equal to 10 are suggestive of hypersomnolence).  DESCRIPTION: A sleep technologist was in attendance for the duration of the recording.  Data collection, scoring, video monitoring, and reporting were performed in compliance with the AASM Manual for the Scoring of Sleep and Associated Events; (Hypopnea is scored based on the criteria listed in Section VIII D. 1b in the AASM Manual V2.6 using a 4% oxygen desaturation rule or Hypopnea is scored based on the criteria listed in Section VIII D. 1a in the AASM Manual V2.6 using 3% oxygen desaturation and /or arousal rule).  A physician certified by the American Board of Sleep Medicine reviewed each epoch of the study.  ADDITIONAL INFORMATION:  Height: 72.0 in Weight: 206 lb (BMI 27) Neck Size: 20.00 (!)  MEDICATIONS: Xanax , Coreg , Fenofibrate , Flonase , Lasix , Zestril , Mag-Ox,  Glucophage , Nitrostat , Crestor , Betapace , Aldactone   The study began with a fitting for a mask, and the patient chose the S/M sized Evora FFM. Start pressure was 5 cm water ,  and 9 cm CPAP was finally explored before oxygen was added, at 1 L/ minute.  This was then increased to 2L m and CPAP ended at 9 cm water  pressure with a sleep time of 180 minutes, AHI of 1.9/h. [name]   SLEEP CONTINUITY AND SLEEP ARCHITECTURE:  Lights off was at 22:14: and lights on 04:57: (6.7 hours in bed). Total sleep time was 291.0 minutes with a  decreased sleep efficiency at 72.0%. Sleep latency was normal at 21.0 minutes.   Of the total sleep time, the percentage of stage N1 sleep was 10.0%, stage N2 sleep was 63.6%, stage N3 sleep was 0.0%, and REM sleep was 26.5%. There were 7 Stage R periods observed on this study night, 31 awakenings (i.e. transitions to Stage W from any sleep stage), and 95.0 total stage transitions. Wake after sleep onset (WASO) time accounted for 92 minutes.  AROUSAL: There were 80 arousals in total, for an arousal index of 15.5 /hour.  Of these, 10 were identified as respiratory-related arousals (2.1 /h), 6 were PLM-related arousals (1.2 /h), and 64 were non-specific arousals (13.2 /h)  RESPIRATORY MONITORING:  Based on CMS criteria (using a 4% oxygen desaturation rule for scoring hypopneas), there were 5 apneas (1 obstructive; 2 central; 2 mixed), and 12 hypopneas.  The Apnea index was 1.0/h and Hypopnea index was 2.5/h . The apnea-hypopnea index was only 3.5/h  overall (4.1/h supine, 3.3/h in non-supine; 7/h in REM, 2/h in NREM). There were 0 respiratory effort-related arousals (RERAs).   OXIMETRY: Respiratory events were associated with oxyhemoglobin desaturation with a nadir( asleep)  at 81%, down  from a mean of 91% . Total sleep time spent below 89%  oxygen saturation was 46.8 minutes, or 16.1% of total sleep time. EKG: The electrocardiogram showed a paced rhythm .  The average heart rate during sleep was 61 bpm.  The maximum heart rate during sleep was 76 bpm. The maximum heart rate during recording was 89 bpm.  BODY POSITION: Duration of total sleep and percent of total sleep in their respective position is as follows: supine 57 minutes (19.8%), non-supine 233.5 minutes (80.2%); right 232 minutes (79.9%), left 01 minutes (0.3%), and prone 00 minutes (0.0%). Total supine REM sleep time was 45 minutes (59.1% of total REM sleep). LIMB MOVEMENTS: There were 80 periodic limb movements of sleep (16.5/h), of which 6  (1.2/h) were associated with an arousal.  IMPRESSION:   1. Severe complex sleep apnea with  Hypoxemia was again documented, CPAP controlled most events at 9 cm water  pressure under 2L /oxygen.  CPAP therapy alone failed to control hypoxia. Snoring was controlled by CPAP under the use of a FFM.     2. Paced rhythm was seen in this patient with implanted AICD,  3. REM sleep rebounded under CPAP.    4. PLMs were intruding into REM sleep, a sign of possible REM BD developing.      RECOMMENDATIONS:  Prescription for a S/M sized Evora FFM.  Ending pressure was 9 cm CPAP, the auto- set Resmed device will be ordered between 7 and 9 cm water  .   and oxygen will be needed at 2L minute, heated humidification will be ordered . CPAP ended at 9 cm water  pressure with a sleep time of 180 minutes, AHI of 1.9/h. [name]  Lovette Merta,  MD             Recommended Settings IPAP: N/A cmH20 EPAP: N/A cmH2O AHI: N/A AHI (4%): N/A   Pressure IPAP/EPAP 00 05 06 07 07 09 09   O2 Vol 0.0 0.0 0.0 0.0 1.0 2.0 1.0  Time TRT 0.62m 80.19m 54.28m 89.33m 4.51m 165.82m 11.23m   TST 0.18m 58.62m 22.77m 76.56m 4.81m 123.33m 6.29m  Sleep Stage % Wake 0.0 26.9 58.3 14.6 0.0 25.4 43.5   % REM 0.0 0.0 55.6 8.6 100.0 40.5 61.5   % N1 0.0 6.0 13.3 7.2 0.0 13.0 15.4   % N2 0.0 94.0 31.1 84.2 0.0 46.6 23.1   % N3 0.0 0.0 0.0 0.0 0.0 0.0 0.0  Respiratory Total Events 0 1 6 8 3 6 3    Obs. Apn. 0 0 0 0 0 1 0   Mixed Apn. 0 0 0 1 0 1 0   Cen. Apn. 0 0 0 1 0 1 0   Hypopneas 0 1 6 6 3 3 3    AHI 0.00 1.03 16.00 6.32 45.00 2.91 27.69   Supine AHI 0.00 0.00 0.00 0.00 45.00 4.29 27.69   Prone AHI 0.00 0.00 0.00 0.00 0.00 0.00 0.00   Side AHI 0.00 1.03 16.00 6.76 0.00 2.21 0.00  Respiratory (4%) Hypopneas (4%) 0.00 0.00 6.00 3.00 0.00 1.00 2.00   AHI (4%) 0.00 0.00 16.00 3.95 0.00 1.94 18.46   Supine AHI (4%) 0.00 0.00 0.00 0.00 0.00 2.86 18.46   Prone AHI (4%) 0.00 0.00 0.00 0.00 0.00 0.00 0.00   Side AHI (4%) 0.00 0.00  16.00 4.23 0.00 1.47 0.00  Desat Profile <= 90% 0.32m 70.93m 41.71m 84.24m 4.59m 52.65m 11.63m   <= 80% 0.66m 0.62m 0.3m 0.62m 0.70m 5.11m 0.39m   <= 70% 0.28m 0.61m 0.23m 0.5m 0.63m 5.75m 0.72m   <= 60% 0.76m 0.44m 0.23m 0.31m 0.53m 5.51m 0.71m  Arousal Index Apnea 0.0 0.0 0.0 1.6 0.0 1.0 0.0   Hypopnea 0.0 1.0 2.7 1.6 0.0 0.5 9.2   LM 0.0 3.1 2.7 1.6 0.0 4.9 18.5   Spontaneous 0.0 4.1 18.7 14.2 30.0 14.1 36.9   "

## 2024-04-21 ENCOUNTER — Ambulatory Visit: Attending: Cardiology

## 2024-04-21 ENCOUNTER — Telehealth: Payer: Self-pay

## 2024-04-21 DIAGNOSIS — I5022 Chronic systolic (congestive) heart failure: Secondary | ICD-10-CM | POA: Diagnosis not present

## 2024-04-21 DIAGNOSIS — Z95810 Presence of automatic (implantable) cardiac defibrillator: Secondary | ICD-10-CM

## 2024-04-21 NOTE — Telephone Encounter (Signed)
 Remote ICM transmission received.  Attempted call to patient regarding ICM remote transmission and no answer.

## 2024-04-21 NOTE — Progress Notes (Unsigned)
 EPIC Encounter for ICM Monitoring  Patient Name: [name] is a 69 y.o. male Date: 04/21/2024 Primary Care Physican: Colton Ade, MD Primary Cardiologist: [name]: [name] Pacing: 98.2%  04/14/2024 Weight: 205.9 lbs        ICM fluid check for HF clinic as requested by Colton Gainer, NP.  Torsemide decreased to 60 mg daily after starting Farxiga  at 04/14/2024 HF Clinic Office visit.   Transmission results reviwed.    Optivol thoracic impedance suggesting possible dryness starting 04/16/2024.   Prescribed:  Furosemide  40 mg take 1.5 tablet(s) (60 mg total) by mouth daily.  Take additional 40 mg for weight gain of 3 lbs in a day or 5 lbs in a week or leg swelling. Potassium 20 mEq take 1 tablet(s) (20 mEq total) by mouth daily. Spironolactone  25 mg take 1 tablet daily  Labs: 04/14/2024 Creatinine 1.79, BUN 25, Potassium 4.5, Sodium 136, GFR 41  A complete set of results can be found in Results Review.  Recommendations: Unable to reach.  Copy sent to Colton Gainer, NP at [facility] clinic as requested following 04/14/2024 OV.   Follow-up plan: No ICM clinic phone appointment scheduled.   91 day device clinic remote transmission 06/26/2024.    EP/Cardiology Office Visits: 06/03/2024 with Dr. Kennyth (to establish care).    Copy of ICM check sent to Dr. Kennyth.    Remote monitoring is medically necessary for Heart Failure Management.    Daily Thoracic Impedance ICM trend: 01/22/2024 through 04/21/2024.    12-14 Month Thoracic Impedance ICM trend:     Colton GORMAN Garner, RN 04/21/2024 2:06 PM

## 2024-04-21 NOTE — Progress Notes (Unsigned)
 Attempted call to patient and unable to reach.  Left message for return call.

## 2024-04-21 NOTE — Progress Notes (Unsigned)
" °  Received: Today [place], Harlene HERO, FNP  Chaylee Ehrsam, Mitzie RAMAN, RN Please decrease lasix  to 40 mg daily and stop KCL supplement. Thanks! "

## 2024-04-22 NOTE — Progress Notes (Unsigned)
 Attempted to contact pt LMOM @ [phone]

## 2024-04-22 NOTE — Progress Notes (Unsigned)
 Attempted ICM call to patient to provide recommendations from [facility], NP.   Sent update to Harlene Gainer, NP explaining unable to reach patient to provide recommendations.    Will ask if HF clinic can attempt to reach patient.

## 2024-04-23 MED ORDER — FUROSEMIDE 40 MG PO TABS: 40.0000 mg | ORAL_TABLET | Freq: Every day | ORAL | 3 refills | Status: AC

## 2024-04-23 NOTE — Addendum Note (Signed)
 Addended by: JERONA DALTON HERO on: 04/23/2024 04:34 PM   Modules accepted: Orders

## 2024-04-23 NOTE — Progress Notes (Signed)
 Pt returned call and voiced understanding.

## 2024-04-23 NOTE — Progress Notes (Signed)
 Attempted to contact pt [name] @ [phone]

## 2024-04-28 ENCOUNTER — Encounter: Payer: Self-pay | Admitting: Internal Medicine

## 2024-04-28 NOTE — Telephone Encounter (Signed)
 [name]

## 2024-05-13 ENCOUNTER — Telehealth: Payer: Self-pay | Admitting: Neurology

## 2024-05-13 NOTE — Telephone Encounter (Signed)
 Can you resend this please? You sent to [facility]

## 2024-06-03 ENCOUNTER — Ambulatory Visit: Admitting: Cardiology

## 2024-07-03 ENCOUNTER — Encounter: Admitting: Adult Health

## 2024-07-28 ENCOUNTER — Ambulatory Visit: Admitting: Adult Health
